# Patient Record
Sex: Male | Born: 1965
Health system: Southern US, Community
[De-identification: ages and names within clinical notes are randomized; demographics above are authoritative.]

## PROBLEM LIST (undated history)

## (undated) DIAGNOSIS — K909 Intestinal malabsorption, unspecified: Secondary | ICD-10-CM

## (undated) DIAGNOSIS — K219 Gastro-esophageal reflux disease without esophagitis: Secondary | ICD-10-CM

## (undated) DIAGNOSIS — K469 Unspecified abdominal hernia without obstruction or gangrene: Secondary | ICD-10-CM

## (undated) DIAGNOSIS — I4819 Other persistent atrial fibrillation: Secondary | ICD-10-CM

## (undated) DIAGNOSIS — Z9884 Bariatric surgery status: Secondary | ICD-10-CM

## (undated) DIAGNOSIS — E538 Deficiency of other specified B group vitamins: Secondary | ICD-10-CM

## (undated) DIAGNOSIS — M542 Cervicalgia: Secondary | ICD-10-CM

## (undated) DIAGNOSIS — G4730 Sleep apnea, unspecified: Secondary | ICD-10-CM

## (undated) DIAGNOSIS — I639 Cerebral infarction, unspecified: Secondary | ICD-10-CM

## (undated) DIAGNOSIS — I428 Other cardiomyopathies: Secondary | ICD-10-CM

## (undated) DIAGNOSIS — R319 Hematuria, unspecified: Secondary | ICD-10-CM

## (undated) DIAGNOSIS — I10 Essential (primary) hypertension: Secondary | ICD-10-CM

## (undated) DIAGNOSIS — L509 Urticaria, unspecified: Secondary | ICD-10-CM

## (undated) DIAGNOSIS — K645 Perianal venous thrombosis: Secondary | ICD-10-CM

## (undated) DIAGNOSIS — M19079 Primary osteoarthritis, unspecified ankle and foot: Secondary | ICD-10-CM

## (undated) DIAGNOSIS — G610 Guillain-Barre syndrome: Secondary | ICD-10-CM

## (undated) DIAGNOSIS — Z980 Intestinal bypass and anastomosis status: Secondary | ICD-10-CM

## (undated) DIAGNOSIS — D509 Iron deficiency anemia, unspecified: Secondary | ICD-10-CM

## (undated) HISTORY — DX: Deficiency of other specified B group vitamins: E53.8

## (undated) HISTORY — DX: Essential (primary) hypertension: I10

## (undated) HISTORY — DX: Guillain-Barre syndrome: G61.0

## (undated) HISTORY — DX: Iron deficiency anemia, unspecified: D50.9

## (undated) HISTORY — DX: Bariatric surgery status: Z98.84

## (undated) HISTORY — DX: Other persistent atrial fibrillation: I48.19

## (undated) HISTORY — DX: Cerebral infarction, unspecified: I63.9

## (undated) HISTORY — DX: Intestinal malabsorption, unspecified: K90.9

## (undated) HISTORY — DX: Unspecified abdominal hernia without obstruction or gangrene: K46.9

## (undated) HISTORY — DX: Sleep apnea, unspecified: G47.30

## (undated) HISTORY — DX: Perianal venous thrombosis: K64.5

## (undated) HISTORY — DX: Primary osteoarthritis, unspecified ankle and foot: M19.079

## (undated) HISTORY — DX: Intestinal bypass and anastomosis status: Z98.0

## (undated) HISTORY — DX: Urticaria, unspecified: L50.9

## (undated) HISTORY — DX: Morbid (severe) obesity due to excess calories: E66.01

## (undated) HISTORY — DX: Cervicalgia: M54.2

## (undated) HISTORY — PX: UMBILICAL HERNIA REPAIR: SHX196

## (undated) HISTORY — PX: HERNIA REPAIR: SHX51

---

## 2002-04-15 ENCOUNTER — Encounter: Payer: Self-pay | Admitting: General Practice

## 2002-04-15 ENCOUNTER — Encounter: Admission: RE | Admit: 2002-04-15 | Discharge: 2002-04-15 | Payer: Self-pay | Admitting: General Practice

## 2002-07-24 HISTORY — PX: GASTRIC BYPASS: SHX52

## 2004-01-24 ENCOUNTER — Ambulatory Visit: Admission: RE | Admit: 2004-01-24 | Discharge: 2004-01-24 | Payer: Self-pay | Admitting: Otolaryngology

## 2008-05-08 ENCOUNTER — Ambulatory Visit: Payer: Self-pay

## 2008-07-07 ENCOUNTER — Ambulatory Visit: Payer: Self-pay | Admitting: Cardiology

## 2010-12-06 NOTE — Assessment & Plan Note (Signed)
[facility]                            CARDIOLOGY OFFICE NOTE   NAME:Tyrone [name], Tyrone [name]                          MRN:          [id number]  DATE:07/06/2008                            DOB:          Jul 02, 1966    PRIMARY CARE PHYSICIAN:  [name], MD   REASON FOR PRESENTATION:  Evaluate the patient with chest pain and  abnormal Cardiolite.   HISTORY OF PRESENT ILLNESS:  The patient presents for followup of an  episode of chest pain that was about 6 weeks ago.  He said he wears CPAP  at night for sleep apnea.  He woke up vomiting into his mask.  He had  severe 10/10 chest discomfort like somebody was stabbing him in the  chest.  This went on for maybe 15-30 minutes.  It finally eased off on  its own.  He had a little bit of chest soreness throughout that night  and into the morning.  He presented to Dr. Sherryll Burger and was admitted to the  hospital where he ruled out for myocardial infarction.  He had maybe one  other mild episode of this couple of weeks later.  These were both at  rest.  There was no associated diaphoresis or shortness of breath.  He  had no radiation to his jaw or to his arms.  He had never had anything  like this before.  Again, it was unprovoked and went away on its own.  He said he was able to go on vacation not long ago and do an awful lot  of walking without any chest discomfort or shortness of breath.  He does  not exercise routinely, but he does coach baseball during the summer, he  has never had any problems with this.   He was sent for a stress perfusion study.  This was a negative study  with an EF of 61%; however, there was some apical thinning and he and  his family were concerned about this.   PAST MEDICAL HISTORY:  1. Morbid obesity.  2. Sleep apnea.  3. History of Guillain-Barre.  4. Gastroesophageal reflux disease.  5. Degenerative joint disease.   PAST SURGICAL HISTORY:  1. Gastric bypass.  2. Inguinal hernia repairs.  3. Cyst removed from his spine.   ALLERGIES:  None.   MEDICATIONS:  Citracal, multivitamin, vitamin C, Tums.   SOCIAL HISTORY:  The patient is married.  He owns a driving instructing  school.  He coaches baseball.  He has 1 child.  He quit chewing tobacco  in 2004 and does not smoke cigarettes.   Family history is contributory for his father dying of a heart attack  suddenly at age 58.   REVIEW OF SYSTEMS:  As stated in the HPI, positive for occasional  headaches and dizziness, rare palpitations.  Negative for all other  systems.   PHYSICAL EXAMINATION:  GENERAL:  The patient is pleasant and in no  distress.  VITAL SIGNS:  Blood pressure 138/90, heart rate 63 and regular, weight  134 pounds, body mass index 37.  HEENT:  Eyes  unremarkable, pupils equal, round, and reactive to light,  fundi within normal limits, oral mucosa unremarkable.  NECK:  No jugular venous distention at 45 degrees, carotid upstroke  brisk and symmetric, no bruits, no thyromegaly.  LYMPHATICS:  No cervical, axillary, or inguinal adenopathy.  LUNGS:  Clear to auscultation bilaterally.  BACK:  No costovertebral angle tenderness.  CHEST:  Unremarkable.  HEART:  PMI not displaced or sustained, S1 and S2 within normal limits,  no S3, no S4, no clicks, no rubs, no murmurs.  ABDOMEN:  Obese, positive bowel sounds normal frequency and pitch, no  bruits, no rebound, no guarding, no midline pulsatile mass, no  hepatomegaly, no splenomegaly.  SKIN:  No rashes, no nodules.  EXTREMITIES:  Pulses 2+ throughout, no edema, no cyanosis, no clubbing.  NEUROLOGIC:  Oriented to person, place, and time, cranial nerves II  through XII grossly intact, motor grossly intact.   EKG:  Sinus rhythm, rate 63, axis within normal limits, intervals within  normal limits, no acute ST-T wave changes.   ASSESSMENT AND PLAN:  1. Chest discomfort.  The patient's chest discomfort was atypical,      though he has a strong family history.  A  stress perfusion study      was essentially normal.  I reviewed this with him and pointed out      the physiology of apical thinning.  I think this combination of      atypical symptoms with this Cardiolite suggests that it is unlikely      that he has any high-grade obstructive coronary disease.  However,      we reviewed at length the concept of a nonobstructive disease and      the risk of spontaneous plaque rupture.  Therefore, he needs      aggressive risk reduction.  He will follow with Dr. Sherryll Burger for workup      of nonanginal possibly GI chest pain should his symptoms continue.  2. Obesity.  I applaud his weight loss and encouraged more of the      same.  3. Risk reduction.  He says he has normal lipids, and I encouraged him      to have the numbers in hand, and I would suggest his goal should be      an LDL less than 100 and HDL greater than 40.  4. Exercise.  I outlined to the patient an exercise regimen.  Though      he works 2 jobs, he      should still be able to exercise half hour 5 days a week.  5. Followup.  I will see the patient back as needed.     Rollene Rotunda, MD, [facility]  Electronically Signed    JH/MedQ  DD: 07/07/2008  DT: 07/07/2008  Job #: [id number]   cc:   [name], MD

## 2010-12-09 NOTE — Procedures (Signed)
NAME:  Tyrone [name], Tyrone [name] NO.:  [id number]   MEDICAL RECORD NO.:  [id number]          PATIENT TYPE:  OUT   LOCATION:  SLEEP LAB                     FACILITY:  [facility]   PHYSICIAN:  Marcelyn Bruins, M.D. [facility] DATE OF BIRTH:  February 28, 1966   DATE OF ADMISSION:  01/24/2004  DATE OF DISCHARGE:  01/24/2004                              NOCTURNAL POLYSOMNOGRAM   REFERRING PHYSICIAN:  Dr. Jori Moll   INDICATION FOR STUDY:  History of obstructive sleep apnea and difficulty  with CPAP machine after significant weight loss.   SLEEP ARCHITECTURE:  Total sleep time was 307 minutes with decreased REM and  slow-wave sleep.  Sleep onset latency was significantly prolonged at 89  minutes and REM latency was normal.   IMPRESSION/RECOMMENDATION:  1. A split night study was scheduled for the patient.  However, inadequate     numbers of events did not occur until approximately 3 a.m.  At that     point, the patient began to sleep more consistently in the supine     position, accompanied by a great increase in the number of obstructive     events.  Since the patient had been on CPAP as an outpatient, CPAP was     initiated at this point and titrated to therapeutic levels.  The patient     was found to have moderate obstructive sleep apnea with a respiratory     disturbance index of 26 events per hour and desaturation to 85% prior to     initiation of CPAP.  He was found to have 82 obstructive events in the     first 189 minutes of sleep.  These events were clearly positional as     noted above.  There was moderate snoring and snorting noted prior to CPAP     initiation.  The patient was titrated with a medium/small comfort select     gel mask.  However, he had difficulties with mouth opening.  The patient     may need a chin strap or possibly change to a full face mask in order to     alleviate these difficulties.  The patient was titrated to a level of 6     cm with excellent control of  obstructive events even through supine REM.  2. No cardiac arrhythmias.  3. No significant periodic leg movements were noted.                                   ______________________________                                Marcelyn Bruins, M.D. [facility]     KC/MEDQ  D:  02/05/2004 16:57:51  T:  02/06/2004 23:39:04  Job:  [id number]

## 2011-09-25 ENCOUNTER — Encounter: Payer: Self-pay | Admitting: Cardiology

## 2011-11-16 ENCOUNTER — Ambulatory Visit (INDEPENDENT_AMBULATORY_CARE_PROVIDER_SITE_OTHER): Payer: BC Managed Care – PPO | Admitting: Cardiology

## 2011-11-16 ENCOUNTER — Encounter: Payer: Self-pay | Admitting: Cardiology

## 2011-11-16 VITALS — BP 162/90 | HR 54 | Ht 66.0 in | Wt 247.0 lb

## 2011-11-16 DIAGNOSIS — F4541 Pain disorder exclusively related to psychological factors: Secondary | ICD-10-CM

## 2011-11-16 DIAGNOSIS — I10 Essential (primary) hypertension: Secondary | ICD-10-CM

## 2011-11-16 DIAGNOSIS — E669 Obesity, unspecified: Secondary | ICD-10-CM

## 2011-11-16 DIAGNOSIS — G44209 Tension-type headache, unspecified, not intractable: Secondary | ICD-10-CM

## 2011-11-16 MED ORDER — SPIRONOLACTONE 25 MG PO TABS: 25.0000 mg | ORAL_TABLET | Freq: Every day | ORAL | Status: DC

## 2011-11-16 NOTE — Assessment & Plan Note (Signed)
Patient has difficult to control blood pressure. I will start with a workup for secondary causes. He's had normal electrolytes and TSH. He had treated sleep apnea. Echocardiography recently demonstrated no significant abnormalities other than mild aortic stenosis. I will order renin and aldosterone level and a 24 hour urine for metanephrines. I will start him on spironolactone 25 mg daily with a basic metabolic profile in one week in one month. He will likely need further adjustments. We discussed therapeutic lifestyle changes such as weight loss.

## 2011-11-16 NOTE — Patient Instructions (Addendum)
Your physician recommends that you schedule a follow-up appointment in: ONE MONTH WITH DR [facility] IN [place]  Your physician recommends that you HAVE LAB WORK TODAY  HAVE LAB WORK IN ONE WEEK AND IN ONE MONTH  START SPIRONOLACTONE 25 MG ONCE DAILY   24 HOUR URINE FOR VMA AND METINEPHRINES

## 2011-11-16 NOTE — Assessment & Plan Note (Signed)
He had bariatic surgery but he has gained some weight back.  We talked about weight loss.

## 2011-11-16 NOTE — Progress Notes (Signed)
HPI The patient presents for followup of difficult to control hypertension. I saw him greater than 3 years ago for a stress test given cardiovascular risk factors. He's had no new cardiovascular complaints since then but he has been noted to have hypertension recently. He says his blood pressures often in the 170/110 range. He was started on a low-dose of Cozaar and her dose doubled and he had no benefit from this. He does have some itching in his legs but denies flushing, orthostatic symptoms. He doesn't exercise but he walks a lot with his job. With this he denies any cardiovascular symptoms The patient denies any new symptoms such as chest discomfort, neck or arm discomfort. There has been no new shortness of breath, PND or orthopnea. There have been no reported palpitations, presyncope or syncope.  He has stress working about 80 hours per week.   No Known Allergies  Current Outpatient Prescriptions  Medication Sig Dispense Refill  . Calcium Carbonate-Vitamin D (CALCIUM 500+D PO) Take by mouth daily.      Marland Kitchen losartan (COZAAR) 50 MG tablet Take 50 mg by mouth daily.      . Multiple Vitamin (MULTIVITAMIN) tablet Take 2 tablets by mouth daily.      . vitamin E (VITAMIN E) 400 UNIT capsule Take 400 Units by mouth daily.        Past Medical History  Diagnosis Date  . External thrombosed hemorrhoids   . Chest pain   . Osteoarthrosis, unspecified whether generalized or localized, ankle and foot     of the knees,ankle and foot  . Guillain-Barre syndrome   . Osteoarthrosis, unspecified whether generalized or localized, lower leg   . Hypersomnia with sleep apnea, unspecified   . Cervicalgia   . Vitamin B 12 deficiency   . Reflux esophagitis   . Urticaria, unspecified   . Sinoatrial node dysfunction   . Morbid obesity   . Hernia of unspecified site of abdominal cavity without mention of obstruction or gangrene     of abdominal  . Unspecified intestinal malabsorption   . Pancytopenia   .  Hypertension     Past Surgical History  Procedure Date  . Gastric bypass 2004  . Umbilical hernia repair     x 2    Family History  Problem Relation Age of Onset  . Hypertension Mother   . Hypertension Father   . Heart attack Father 39    Died  . Diabetes Father   . Kidney disease Father     with transplant    History   Social History  . Marital Status: Married    Spouse Name: N/A    Number of Children: 1  . Years of Education: N/A   Occupational History  . Realtor    Social History Main Topics  . Smoking status: Never Smoker   . Smokeless tobacco: Former Neurosurgeon    Types: Chew    Quit date: 11/11/2002  . Alcohol Use: Yes  . Drug Use: Not on file  . Sexually Active: Not on file   Other Topics Concern  . Not on file   Social History Narrative   Lives with wife.      ROS: As stated in the HPI and negative for all other systems.   PHYSICAL EXAM BP 162/90  Pulse 54  Ht 5\' 6"  (1.676 m)  Wt 247 lb (112.038 kg)  BMI 39.87 kg/m2 GENERAL:  Well appearing HEENT:  Pupils equal round and reactive, fundi not visualized,  oral mucosa unremarkable NECK:  No jugular venous distention, waveform within normal limits, carotid upstroke brisk and symmetric, no bruits, no thyromegaly LYMPHATICS:  No cervical, inguinal adenopathy LUNGS:  Clear to auscultation bilaterally BACK:  No CVA tenderness CHEST:  Unremarkable HEART:  PMI not displaced or sustained,S1 and S2 within normal limits, no S3, no S4, no clicks, no rubs, soft apical systolic murmur ABD:  Flat, positive bowel sounds normal in frequency in pitch, no bruits, no rebound, no guarding, no midline pulsatile mass, no hepatomegaly, no splenomegaly, obese EXT:  2 plus pulses throughout, no edema, no cyanosis no clubbing SKIN:  No rashes no nodules NEURO:  Cranial nerves II through XII grossly intact, motor grossly intact throughout PSYCH:  Cognitively intact, oriented to person place and time  EKG:  Sinus rhythm, rate  47, axis within normal limits, intervals within normal limits, no acute ST-T wave changes.  ASSESSMENT AND PLAN

## 2011-11-20 ENCOUNTER — Other Ambulatory Visit: Payer: Self-pay | Admitting: Cardiology

## 2011-11-25 LAB — ALDOSTERONE + RENIN ACTIVITY W/ RATIO
ALDO / PRA Ratio: 5.6 Ratio (ref 0.9–28.9)
Aldosterone: 2 ng/dL
PRA LC/MS/MS: 0.36 ng/mL/h (ref 0.25–5.82)

## 2011-11-26 LAB — VMA, URINE, 24 HOUR: Creatinine 24h urine: 1.42 g/(24.h) (ref 0.63–2.50)

## 2011-11-27 ENCOUNTER — Other Ambulatory Visit: Payer: Self-pay | Admitting: Cardiology

## 2011-11-27 LAB — BASIC METABOLIC PANEL
BUN: 11 mg/dL (ref 6–23)
CO2: 27 mEq/L (ref 19–32)
Calcium: 8.6 mg/dL (ref 8.4–10.5)
Chloride: 107 mEq/L (ref 96–112)
Creat: 0.84 mg/dL (ref 0.50–1.35)
Glucose, Bld: 115 mg/dL — ABNORMAL HIGH (ref 70–99)
Potassium: 5 mEq/L (ref 3.5–5.3)
Sodium: 141 mEq/L (ref 135–145)

## 2011-11-29 LAB — METANEPHRINES, URINE, 24 HOUR
Metaneph Total, Ur: 460 ug/(24.h) (ref 182–739)
Metanephrines, Ur: 103 ug/(24.h) (ref 58–203)
Normetanephrine, 24H Ur: 357 ug/(24.h) (ref 88–649)

## 2011-11-30 ENCOUNTER — Encounter: Payer: Self-pay | Admitting: Cardiology

## 2011-12-07 ENCOUNTER — Telehealth: Payer: Self-pay

## 2011-12-07 NOTE — Telephone Encounter (Signed)
left message to call office about lab work

## 2011-12-08 ENCOUNTER — Telehealth: Payer: Self-pay

## 2011-12-08 NOTE — Telephone Encounter (Signed)
Patient returning call about lab results.  

## 2011-12-25 ENCOUNTER — Other Ambulatory Visit: Payer: Self-pay | Admitting: Cardiology

## 2011-12-25 LAB — BASIC METABOLIC PANEL
BUN: 15 mg/dL (ref 6–23)
CO2: 27 mEq/L (ref 19–32)
Calcium: 8.7 mg/dL (ref 8.4–10.5)
Chloride: 105 mEq/L (ref 96–112)
Creat: 0.89 mg/dL (ref 0.50–1.35)
Glucose, Bld: 95 mg/dL (ref 70–99)
Potassium: 4.6 mEq/L (ref 3.5–5.3)
Sodium: 140 mEq/L (ref 135–145)

## 2011-12-26 ENCOUNTER — Ambulatory Visit (INDEPENDENT_AMBULATORY_CARE_PROVIDER_SITE_OTHER): Payer: BC Managed Care – PPO | Admitting: Cardiology

## 2011-12-26 ENCOUNTER — Encounter: Payer: Self-pay | Admitting: *Deleted

## 2011-12-26 ENCOUNTER — Encounter: Payer: Self-pay | Admitting: Cardiology

## 2011-12-26 VITALS — BP 150/92 | HR 65 | Resp 18 | Ht 66.0 in | Wt 247.0 lb

## 2011-12-26 DIAGNOSIS — I10 Essential (primary) hypertension: Secondary | ICD-10-CM

## 2011-12-26 DIAGNOSIS — R079 Chest pain, unspecified: Secondary | ICD-10-CM

## 2011-12-26 DIAGNOSIS — E669 Obesity, unspecified: Secondary | ICD-10-CM

## 2011-12-26 MED ORDER — LOSARTAN POTASSIUM 50 MG PO TABS: 50.0000 mg | ORAL_TABLET | Freq: Two times a day (BID) | ORAL | Status: DC

## 2011-12-26 NOTE — Patient Instructions (Addendum)
   Increase Cozaar to 50mg  twice a day  Your physician recommends that you go to the [facility] lab for blood work in 2 weeks for [employer]. Exercise Treadmill Test - [place] office Follow up based on above test results

## 2011-12-26 NOTE — Assessment & Plan Note (Signed)
He has had some atypical chest pain in the past. He has risk factors and I would like for him to exercise. I will bring the patient back for a POET (Plain Old Exercise Test). This will allow me to screen for obstructive coronary disease, risk stratify and very importantly provide a prescription for exercise.

## 2011-12-26 NOTE — Assessment & Plan Note (Signed)
The patient understands the need to lose weight with diet and exercise. We have discussed specific strategies for this.  

## 2011-12-26 NOTE — Progress Notes (Signed)
   HPI The patient presents for followup of difficult to control hypertension. At the last visit I added spironolactone. He did well with this though his blood pressure is still above target. Followup blood work demonstrated normal potassium though upper limits. The patient denies any new symptoms such as chest discomfort, neck or arm discomfort. There has been no new shortness of breath, PND or orthopnea. There have been no reported palpitations, presyncope or syncope.  He has not lost any weight yet as he has not concentrated on this.  No Known Allergies  Current Outpatient Prescriptions  Medication Sig Dispense Refill  . Calcium Carbonate-Vitamin D (CALCIUM 500+D PO) Take by mouth daily.      . Cyanocobalamin (VITAMIN B12 PO) Take by mouth daily.      Marland Kitchen losartan (COZAAR) 50 MG tablet Take 50 mg by mouth daily.      . Multiple Vitamin (MULTIVITAMIN) tablet Take 2 tablets by mouth daily.      Marland Kitchen spironolactone (ALDACTONE) 25 MG tablet Take 1 tablet (25 mg total) by mouth daily.  30 tablet  12  . vitamin E (VITAMIN E) 400 UNIT capsule Take 400 Units by mouth daily.        Past Medical History  Diagnosis Date  . External thrombosed hemorrhoids   . Chest pain   . Osteoarthrosis, unspecified whether generalized or localized, ankle and foot     of the knees,ankle and foot  . Guillain-Barre syndrome   . Osteoarthrosis, unspecified whether generalized or localized, lower leg   . Hypersomnia with sleep apnea, unspecified   . Cervicalgia   . Vitamin B 12 deficiency   . Reflux esophagitis   . Urticaria, unspecified   . Sinoatrial node dysfunction   . Morbid obesity   . Hernia of unspecified site of abdominal cavity without mention of obstruction or gangrene     of abdominal  . Unspecified intestinal malabsorption   . Pancytopenia   . Hypertension     Past Surgical History  Procedure Date  . Gastric bypass 2004  . Umbilical hernia repair     x 2    ROS: As stated in the HPI and  negative for all other systems.   PHYSICAL EXAM BP 150/92  Pulse 65  Resp 18  Ht 5\' 6"  (1.676 m)  Wt 247 lb (112.038 kg)  BMI 39.87 kg/m2 GENERAL:  Well appearing HEENT:  Pupils equal round and reactive, fundi not visualized, oral mucosa unremarkable NECK:  No jugular venous distention, waveform within normal limits, carotid upstroke brisk and symmetric, no bruits, no thyromegaly LYMPHATICS:  No cervical, inguinal adenopathy LUNGS:  Clear to auscultation bilaterally BACK:  No CVA tenderness CHEST:  Unremarkable HEART:  PMI not displaced or sustained,S1 and S2 within normal limits, no S3, no S4, no clicks, no rubs, soft apical systolic murmur ABD:  Flat, positive bowel sounds normal in frequency in pitch, no bruits, no rebound, no guarding, no midline pulsatile mass, no hepatomegaly, no splenomegaly, obese EXT:  2 plus pulses throughout, no edema, no cyanosis no clubbing    ASSESSMENT AND PLAN

## 2011-12-26 NOTE — Assessment & Plan Note (Signed)
I will increase Cozaar to 50 mg bid.  He needs a BMET in two weeks.

## 2011-12-31 NOTE — Progress Notes (Signed)
Labs OK

## 2012-01-15 ENCOUNTER — Ambulatory Visit (INDEPENDENT_AMBULATORY_CARE_PROVIDER_SITE_OTHER): Payer: BC Managed Care – PPO

## 2012-01-15 ENCOUNTER — Encounter: Payer: Self-pay | Admitting: Cardiology

## 2012-01-15 DIAGNOSIS — R0989 Other specified symptoms and signs involving the circulatory and respiratory systems: Secondary | ICD-10-CM

## 2012-01-15 DIAGNOSIS — R079 Chest pain, unspecified: Secondary | ICD-10-CM

## 2012-01-15 NOTE — Procedures (Signed)
Exercise Treadmill Test  Pre-Exercise Testing Evaluation Rhythm: sinus bradycardia  Rate: 56   PR:  .13 QRS:  .11  QT:  .43 QTc: .42     Test  Exercise Tolerance Test Ordering MD: Angelina Sheriff, MD  Interpreting MD: Ronie Spies PA-C  Unique Test No: 1  Treadmill:  1  Indication for ETT: chest pain - rule out ischemia  Contraindication to ETT: No   Stress Modality: exercise - treadmill  Cardiac Imaging Performed: non   Protocol: standard Bruce - maximal  Max BP: 212/78  Max MPHR (bpm):  173 85% MPR (bpm):  147  MPHR obtained (bpm):  176 % MPHR obtained:  102%  Reached 85% MPHR (min:sec):  7:15 Total Exercise Time (min-sec):  8:00  Workload in METS:  10.1 Borg Scale: 14  Reason ETT Terminated: frequent PACS/PVCs/brief NSVT becoming more frequent    ST Segment Analysis At Rest: normal ST segments - no evidence of significant ST depression With Exercise: no evidence of significant ST depression  Other Information Arrhythmia:  Yes Angina during ETT:  absent (0) Quality of ETT:  diagnostic  ETT Interpretation:  borderline (indeterminate) with non-specific ST changes  Comments: Pretest: BP elevated at baseline EKG NSR ICRBBB nonspecific changes but not significantly changed from 12/26/11 EKG During: PACs/PVCs noted, then near max exercise patient began to exhibit more frequent atrial tach & brief 3-beat NSVT as well. Test stopped due to arrhythmias/BP elevation; max HR had been achieved. No CP or SOB. Patient was asymptomatic during these. Post: Atrial tach becoming less frequent and resolving. No further NSVT. BP coming down. EKG nonacute.  Recommendations: Discussed EKG tracings with Dr. Tenny Craw. Patient had intermittent nonsustained atrial tach during peak exercise, occasional PVCs. Ventricular triplet was noted right in beginning of recovery. These arrhythmias were not associated with any symptoms. BP however did increase with exercise. Per discussion with Dr. Tenny Craw, this study is  indeterminate and may be low risk in absence of ST changes/symptoms, but will defer to Dr. Antoine Poche for further determination of w/u. Patient made aware of findings as well.

## 2012-01-18 ENCOUNTER — Ambulatory Visit (INDEPENDENT_AMBULATORY_CARE_PROVIDER_SITE_OTHER): Payer: BC Managed Care – PPO | Admitting: Cardiology

## 2012-01-18 ENCOUNTER — Encounter: Payer: Self-pay | Admitting: Cardiology

## 2012-01-18 VITALS — BP 155/100 | HR 72 | Ht 66.0 in | Wt 248.4 lb

## 2012-01-18 DIAGNOSIS — E669 Obesity, unspecified: Secondary | ICD-10-CM

## 2012-01-18 DIAGNOSIS — I491 Atrial premature depolarization: Secondary | ICD-10-CM

## 2012-01-18 DIAGNOSIS — I10 Essential (primary) hypertension: Secondary | ICD-10-CM

## 2012-01-18 DIAGNOSIS — I499 Cardiac arrhythmia, unspecified: Secondary | ICD-10-CM

## 2012-01-18 DIAGNOSIS — R079 Chest pain, unspecified: Secondary | ICD-10-CM

## 2012-01-18 MED ORDER — SPIRONOLACTONE 50 MG PO TABS: 50.0000 mg | ORAL_TABLET | Freq: Every day | ORAL | Status: DC

## 2012-01-18 MED ORDER — METOPROLOL TARTRATE 25 MG PO TABS: 25.0000 mg | ORAL_TABLET | Freq: Two times a day (BID) | ORAL | Status: DC

## 2012-01-18 NOTE — Assessment & Plan Note (Signed)
The patient understands the need to lose weight with diet and exercise. We have discussed specific strategies for this.  

## 2012-01-18 NOTE — Patient Instructions (Addendum)
Please increase Spironolactone to 50 mg a day and start Metoprolol to 25 mg twice a day. Continue all other medications as listed.  Have blood work in 1 week and again in 1 month. (BMP)  Follow up with Dr Antoine Poche in 1 month.

## 2012-01-18 NOTE — Assessment & Plan Note (Signed)
He is no longer having chest pain and had none during the stress test.  No change in therapy is indicated.

## 2012-01-18 NOTE — Assessment & Plan Note (Signed)
The blood pressure is still not well controlled. Today I will increase his spironolactone to 50 mg daily. I'm going to have a low dose of beta blocker and metoprolol 25 twice a day. He will continue to keep his blood pressure diary.

## 2012-01-18 NOTE — Assessment & Plan Note (Signed)
I reviewed the full treadmill. While there was some ventricular ectopy the predominance is atrial ectopy and atrial tachycardia with some aberrancy. I do not think this represents an anginal equivalent or evidence for obstructive coronary disease. I will repeat this test when we have had better blood pressure control.

## 2012-01-18 NOTE — Progress Notes (Signed)
   HPI The patient presents for followup of difficult to control hypertension. I sent him for exercise treadmill test which was negative for any evidence of ischemia. However, he had PVCs and some nonsustained ventricular tachycardia.  The patient didn't tolerate the addition of spironolactone and increasing his Cozaar at the last visit. He does not notice any palpitations, presyncope or syncope. He does not have any chest pressure, neck or arm discomfort. He has no shortness of breath, PND or orthopnea. However, his blood pressures not been well-controlled. He reports that his systolics are typically in the 170s.  No Known Allergies  Current Outpatient Prescriptions  Medication Sig Dispense Refill  . Calcium Carbonate-Vitamin D (CALCIUM 500+D PO) Take by mouth daily.      . Cyanocobalamin (VITAMIN B12 PO) Take by mouth daily.      Marland Kitchen losartan (COZAAR) 50 MG tablet Take 1 tablet (50 mg total) by mouth 2 (two) times daily.  60 tablet  6  . Multiple Vitamin (MULTIVITAMIN) tablet Take 2 tablets by mouth daily.      Marland Kitchen spironolactone (ALDACTONE) 25 MG tablet Take 1 tablet (25 mg total) by mouth daily.  30 tablet  12  . vitamin E (VITAMIN E) 400 UNIT capsule Take 400 Units by mouth daily.        Past Medical History  Diagnosis Date  . External thrombosed hemorrhoids   . Chest pain   . Osteoarthrosis, unspecified whether generalized or localized, ankle and foot     of the knees,ankle and foot  . Guillain-Barre syndrome   . Osteoarthrosis, unspecified whether generalized or localized, lower leg   . Hypersomnia with sleep apnea, unspecified   . Cervicalgia   . Vitamin B 12 deficiency   . Reflux esophagitis   . Urticaria, unspecified   . Sinoatrial node dysfunction   . Morbid obesity   . Hernia of unspecified site of abdominal cavity without mention of obstruction or gangrene     of abdominal  . Unspecified intestinal malabsorption   . Pancytopenia   . Hypertension     Past Surgical  History  Procedure Date  . Gastric bypass 2004  . Umbilical hernia repair     x 2    ROS: As stated in the HPI and negative for all other systems.   PHYSICAL EXAM BP 155/100  Pulse 72  Ht 5\' 6"  (1.676 m)  Wt 248 lb 6.4 oz (112.674 kg)  BMI 40.09 kg/m2 GENERAL:  Well appearing HEENT:  Pupils equal round and reactive, fundi not visualized, oral mucosa unremarkable NECK:  No jugular venous distention, waveform within normal limits, carotid upstroke brisk and symmetric, no bruits, no thyromegaly LYMPHATICS:  No cervical, inguinal adenopathy LUNGS:  Clear to auscultation bilaterally BACK:  No CVA tenderness CHEST:  Unremarkable HEART:  PMI not displaced or sustained,S1 and S2 within normal limits, no S3, no S4, no clicks, no rubs, soft apical systolic murmur ABD:  Flat, positive bowel sounds normal in frequency in pitch, no bruits, no rebound, no guarding, no midline pulsatile mass, no hepatomegaly, no splenomegaly, obese EXT:  2 plus pulses throughout, no edema, no cyanosis no clubbing   ASSESSMENT AND PLAN

## 2012-01-26 ENCOUNTER — Other Ambulatory Visit: Payer: BC Managed Care – PPO

## 2012-02-01 ENCOUNTER — Other Ambulatory Visit: Payer: Self-pay | Admitting: Cardiology

## 2012-02-01 LAB — BASIC METABOLIC PANEL
BUN: 11 mg/dL (ref 6–23)
CO2: 26 mEq/L (ref 19–32)
Calcium: 8.6 mg/dL (ref 8.4–10.5)
Chloride: 108 mEq/L (ref 96–112)
Creat: 0.93 mg/dL (ref 0.50–1.35)
Glucose, Bld: 89 mg/dL (ref 70–99)
Potassium: 4.6 mEq/L (ref 3.5–5.3)
Sodium: 141 mEq/L (ref 135–145)

## 2012-02-02 ENCOUNTER — Telehealth: Payer: Self-pay | Admitting: *Deleted

## 2012-02-02 NOTE — Telephone Encounter (Signed)
Message copied by Eustace Moore on Fri Feb 02, 2012  9:35 AM ------      Message from: Rollene Rotunda      Created: Fri Feb 02, 2012  8:23 AM       Labs OK. Call Mr. Crite with the results and send results to [facility], [place]

## 2012-02-02 NOTE — Telephone Encounter (Signed)
Patient informed. Copy sent to [facility] office.

## 2012-02-11 ENCOUNTER — Encounter (HOSPITAL_COMMUNITY): Payer: Self-pay | Admitting: Family Medicine

## 2012-02-11 ENCOUNTER — Emergency Department (HOSPITAL_COMMUNITY): Payer: BC Managed Care – PPO

## 2012-02-11 ENCOUNTER — Inpatient Hospital Stay (HOSPITAL_COMMUNITY)
Admission: EM | Admit: 2012-02-11 | Discharge: 2012-02-16 | DRG: 544 | Disposition: A | Discharge status: Home / Self Care | Payer: BC Managed Care – PPO | Attending: Cardiology | Admitting: Cardiology

## 2012-02-11 DIAGNOSIS — Z9884 Bariatric surgery status: Secondary | ICD-10-CM

## 2012-02-11 DIAGNOSIS — F1011 Alcohol abuse, in remission: Secondary | ICD-10-CM

## 2012-02-11 DIAGNOSIS — I10 Essential (primary) hypertension: Secondary | ICD-10-CM | POA: Diagnosis present

## 2012-02-11 DIAGNOSIS — I4891 Unspecified atrial fibrillation: Principal | ICD-10-CM | POA: Diagnosis present

## 2012-02-11 DIAGNOSIS — R079 Chest pain, unspecified: Secondary | ICD-10-CM | POA: Diagnosis present

## 2012-02-11 DIAGNOSIS — Z833 Family history of diabetes mellitus: Secondary | ICD-10-CM

## 2012-02-11 DIAGNOSIS — F1010 Alcohol abuse, uncomplicated: Secondary | ICD-10-CM | POA: Diagnosis present

## 2012-02-11 DIAGNOSIS — I428 Other cardiomyopathies: Secondary | ICD-10-CM | POA: Diagnosis present

## 2012-02-11 DIAGNOSIS — G4730 Sleep apnea, unspecified: Secondary | ICD-10-CM | POA: Diagnosis present

## 2012-02-11 DIAGNOSIS — Z6838 Body mass index (BMI) 38.0-38.9, adult: Secondary | ICD-10-CM

## 2012-02-11 DIAGNOSIS — Z8249 Family history of ischemic heart disease and other diseases of the circulatory system: Secondary | ICD-10-CM

## 2012-02-11 DIAGNOSIS — I509 Heart failure, unspecified: Secondary | ICD-10-CM | POA: Diagnosis present

## 2012-02-11 DIAGNOSIS — K2100 Gastro-esophageal reflux disease with esophagitis, without bleeding: Secondary | ICD-10-CM | POA: Diagnosis present

## 2012-02-11 DIAGNOSIS — I5021 Acute systolic (congestive) heart failure: Secondary | ICD-10-CM | POA: Diagnosis present

## 2012-02-11 DIAGNOSIS — R319 Hematuria, unspecified: Secondary | ICD-10-CM | POA: Diagnosis present

## 2012-02-11 HISTORY — DX: Other cardiomyopathies: I42.8

## 2012-02-11 HISTORY — DX: Hematuria, unspecified: R31.9

## 2012-02-11 HISTORY — DX: Gastro-esophageal reflux disease without esophagitis: K21.9

## 2012-02-11 LAB — BASIC METABOLIC PANEL
BUN: 10 mg/dL (ref 6–23)
CO2: 23 mEq/L (ref 19–32)
Calcium: 8 mg/dL — ABNORMAL LOW (ref 8.4–10.5)
Chloride: 109 mEq/L (ref 96–112)
Creatinine, Ser: 0.96 mg/dL (ref 0.50–1.35)
GFR calc Af Amer: >90 mL/min (ref >90)
GFR calc non Af Amer: >90 mL/min (ref >90)
Glucose, Bld: 98 mg/dL (ref 70–99)
Potassium: 4.3 mEq/L (ref 3.5–5.1)
Sodium: 140 mEq/L (ref 135–145)

## 2012-02-11 LAB — CBC WITH DIFFERENTIAL/PLATELET
Basophils Absolute: 0.1 10*3/uL (ref 0.0–0.1)
Basophils Relative: 1 % (ref 0–1)
Eosinophils Absolute: 0.1 10*3/uL (ref 0.0–0.7)
Eosinophils Relative: 2 % (ref 0–5)
HCT: 39.1 % (ref 39.0–52.0)
Hemoglobin: 13 g/dL (ref 13.0–17.0)
Lymphocytes Relative: 27 % (ref 12–46)
Lymphs Abs: 1.6 10*3/uL (ref 0.7–4.0)
MCH: 31.1 pg (ref 26.0–34.0)
MCHC: 33.2 g/dL (ref 30.0–36.0)
MCV: 93.5 fL (ref 78.0–100.0)
Monocytes Absolute: 0.4 10*3/uL (ref 0.1–1.0)
Monocytes Relative: 6 % (ref 3–12)
Neutro Abs: 3.8 10*3/uL (ref 1.7–7.7)
Neutrophils Relative %: 64 % (ref 43–77)
Platelets: 164 10*3/uL (ref 150–400)
RBC: 4.18 MIL/uL — ABNORMAL LOW (ref 4.22–5.81)
RDW: 14 % (ref 11.5–15.5)
WBC: 6 10*3/uL (ref 4.0–10.5)

## 2012-02-11 LAB — URINALYSIS, ROUTINE W REFLEX MICROSCOPIC
Bilirubin Urine: NEGATIVE
Glucose, UA: NEGATIVE mg/dL
Ketones, ur: NEGATIVE mg/dL
Leukocytes, UA: NEGATIVE
Nitrite: NEGATIVE
Protein, ur: NEGATIVE mg/dL
Specific Gravity, Urine: 1.012 (ref 1.005–1.030)
Urobilinogen, UA: 0.2 mg/dL (ref 0.0–1.0)
pH: 6 (ref 5.0–8.0)

## 2012-02-11 LAB — URINE MICROSCOPIC-ADD ON

## 2012-02-11 LAB — PROTIME-INR
INR: 1.29 (ref 0.00–1.49)
Prothrombin Time: 16.3 seconds — ABNORMAL HIGH (ref 11.6–15.2)

## 2012-02-11 LAB — PRO B NATRIURETIC PEPTIDE: Pro B Natriuretic peptide (BNP): 1106 pg/mL — ABNORMAL HIGH (ref 0–125)

## 2012-02-11 LAB — APTT: aPTT: 26 seconds (ref 24–37)

## 2012-02-11 LAB — POCT I-STAT TROPONIN I: Troponin i, poc: 0 ng/mL (ref 0.00–0.08)

## 2012-02-11 MED ORDER — ADULT MULTIVITAMIN W/MINERALS CH
3.0000 | ORAL_TABLET | Freq: Every day | ORAL | Status: DC
  Administered 2012-02-12 – 2012-02-16 (×5): 3 via ORAL
  Filled 2012-02-11 (×5): qty 3

## 2012-02-11 MED ORDER — SODIUM CHLORIDE 0.9 % IV SOLN
INTRAVENOUS | Status: DC
  Administered 2012-02-11: 125 mL/h via INTRAVENOUS

## 2012-02-11 MED ORDER — DEXTROSE 5 % IV SOLN
5.0000 mg/h | INTRAVENOUS | Status: DC
  Filled 2012-02-11: qty 100

## 2012-02-11 MED ORDER — ASPIRIN 81 MG PO CHEW
324.0000 mg | CHEWABLE_TABLET | Freq: Once | ORAL | Status: AC
  Administered 2012-02-11: 324 mg via ORAL
  Filled 2012-02-11: qty 4

## 2012-02-11 MED ORDER — CALCIUM CARBONATE-VITAMIN D 500-200 MG-UNIT PO TABS
1.0000 | ORAL_TABLET | Freq: Every day | ORAL | Status: DC
  Administered 2012-02-12 – 2012-02-16 (×5): 1 via ORAL
  Filled 2012-02-11 (×7): qty 1

## 2012-02-11 MED ORDER — FUROSEMIDE 10 MG/ML IJ SOLN
40.0000 mg | Freq: Two times a day (BID) | INTRAMUSCULAR | Status: DC
  Administered 2012-02-12 (×2): 40 mg via INTRAVENOUS
  Filled 2012-02-11 (×6): qty 4

## 2012-02-11 MED ORDER — SPIRONOLACTONE 50 MG PO TABS
50.0000 mg | ORAL_TABLET | Freq: Every day | ORAL | Status: DC
  Filled 2012-02-11 (×2): qty 1

## 2012-02-11 MED ORDER — METOPROLOL TARTRATE 25 MG PO TABS
25.0000 mg | ORAL_TABLET | Freq: Two times a day (BID) | ORAL | Status: DC
  Administered 2012-02-12: 25 mg via ORAL

## 2012-02-11 MED ORDER — VITAMIN E 180 MG (400 UNIT) PO CAPS
400.0000 [IU] | ORAL_CAPSULE | Freq: Every day | ORAL | Status: DC
  Administered 2012-02-12 – 2012-02-16 (×5): 400 [IU] via ORAL
  Filled 2012-02-11 (×7): qty 1

## 2012-02-11 MED ORDER — VITAMIN B-12 100 MCG PO TABS
100.0000 ug | ORAL_TABLET | Freq: Every day | ORAL | Status: DC
  Administered 2012-02-12 – 2012-02-16 (×5): 100 ug via ORAL
  Filled 2012-02-11 (×7): qty 1

## 2012-02-11 NOTE — ED Notes (Signed)
Per pt woke up this am with right sided chest pain and SOB. sts also dry cough. Recent stress test and was D/C due to increased BP. Denies N,V,D. sts blood in urine but is being treated for that.

## 2012-02-11 NOTE — Consult Note (Signed)
CARDIOLOGY CONSULT NOTE   Patient ID: Tyrone [name] MRN: [id number] DOB/AGE: [birth year] 46 y.o.  Admit date: 02/11/2012  Primary Physician   [name], MD Primary Cardiologist   [facility] Reason for Consultation   Chest pain, SOB  WUJ:Tyrone [name] is a 46 y.o. male with no history of CAD.  Pt was in  [facility] until this am. He woke with SOB at rest, worse with exertion and chest pain   Past Medical History  Diagnosis Date  . External thrombosed hemorrhoids   . Chest pain - S/P ETT on 01/15/2012   . Osteoarthrosis, unspecified whether generalized or localized, ankle and foot     of the knees,ankle and foot  . Guillain-Barre syndrome   . Osteoarthrosis, unspecified whether generalized or localized, lower leg   . Hypersomnia with sleep apnea, unspecified   . Cervicalgia   . Vitamin B 12 deficiency   . Reflux esophagitis   . Urticaria, unspecified   . Sinoatrial node dysfunction   . Morbid obesity   . Hernia of unspecified site of abdominal cavity without mention of obstruction or gangrene     of abdominal  . Unspecified intestinal malabsorption   . Pancytopenia   . Hypertension   . Sleep apnea     CPAP   Past Surgical History  Procedure Date  . Gastric bypass 2004  . Umbilical hernia repair     x 2    No Known Allergies  I have reviewed the patient's current medications    . aspirin  324 mg Oral Once      . sodium chloride 125 mL/hr (02/11/12 1241)    Prior to Admission medications   Medication Sig Start Date End Date Taking? Authorizing Provider  Calcium Carbonate-Vitamin D (CALCIUM 500+D PO) Take 1 tablet by mouth daily.    Yes Historical Provider, MD  Cyanocobalamin (VITAMIN B12 PO) Take 1 tablet by mouth daily.    Yes Historical Provider, MD  losartan (COZAAR) 50 MG tablet Take 1 tablet (50 mg total) by mouth 2 (two) times daily. 12/26/11  Yes Rollene Rotunda, MD  metoprolol tartrate (LOPRESSOR) 25 MG tablet Take 1 tablet (25 mg total) by mouth 2 (two) times daily.  01/18/12 01/17/13 Yes Rollene Rotunda, MD  Multiple Vitamin (MULTIVITAMIN) tablet Take 3 tablets by mouth daily.    Yes Historical Provider, MD  spironolactone (ALDACTONE) 50 MG tablet Take 1 tablet (50 mg total) by mouth daily. 01/18/12 01/17/13 Yes Rollene Rotunda, MD  vitamin E (VITAMIN E) 400 UNIT capsule Take 400 Units by mouth daily.   Yes Historical Provider, MD     History   Social History  . Marital Status: Married    Spouse Name: N/A    Number of Children: 1  . Years of Education: N/A   Occupational History  . Realtor    Social History Main Topics  . Smoking status: Never Smoker   . Smokeless tobacco: Former Neurosurgeon    Types: Chew    Quit date: 11/11/2002  . Alcohol Use: Yes  . Drug Use: Not on file  . Sexually Active: Not on file   Other Topics Concern  . Not on file   Social History Narrative   Lives with wife.       Family History  Problem Relation Age of Onset  . Hypertension Mother   . Hypertension Father   . Heart attack Father 19    Died  . Diabetes Father   . Kidney disease Father  with transplant     ROS:  Full 14 point review of systems complete and found to be negative unless listed above.  Physical Exam: Blood pressure 149/105, temperature 98.2 F (36.8 C), resp. rate 14, SpO2 99.00%.  General: Well developed, well nourished, male in no acute distress Head: Eyes PERRLA, No xanthomas.   Normocephalic and atraumatic, oropharynx without edema or exudate. Dentition Lungs: bilateral  Heart: HRRR S1 S2, no rub/gallop, Heart irregular rate and rhythm with S1, S2  murmur. pulses are 2+ extrem.   Neck: No carotid bruits. No lymphadenopathy.  JVD. Abdomen: Bowel sounds present, abdomen soft and non-tender without masses or hernias noted. Msk:  No spine or cva tenderness. No weakness, no joint deformities or effusions. Extremities: No clubbing or cyanosis.  edema.  Neuro: Alert and oriented X 3. No focal deficits noted. Psych:  Good affect, responds  appropriately Skin: No rashes or lesions noted.  Labs:   Lab Results  Component Value Date   WBC 6.0 02/11/2012   HGB 13.0 02/11/2012   HCT 39.1 02/11/2012   MCV 93.5 02/11/2012   PLT 164 02/11/2012    Basename 02/11/12 1230  INR 1.29    Lab 02/11/12 1230  NA 140  K 4.3  CL 109  CO2 23  BUN 10  CREATININE 0.96  CALCIUM 8.0*  PROT --  BILITOT --  ALKPHOS --  ALT --  AST --  GLUCOSE 98     Basename 02/11/12 1247  TROPIPOC 0.00   Pro B Natriuretic peptide (BNP)  Date/Time Value Range Status  02/11/2012 12:30 PM 1106.0* 0 - 125 pg/mL Final   ECG:  11-Feb-2012 11:55:04 [facility]/ED ROUTINE RECORD Atrial fibrillation Incomplete right bundle branch block Prolonged QT Abnormal ECG 44mm/s 59mm/mV 100Hz  8.0.1 12SL 241 HD CID: 1 Referred by: Unconfirmed Vent. rate 98 BPM PR interval * ms QRS duration 104 ms QT/QTc 380/485 ms P-R-T axes * 23 25 Radiology:  Ct Abdomen Pelvis Wo Contrast  02/11/2012  *RADIOLOGY REPORT*  Clinical Data: Shortness of breath, hematuria, history of gastric bypass  CT ABDOMEN AND PELVIS WITHOUT CONTRAST  Technique:  Multidetector CT imaging of the abdomen and pelvis was performed following the standard protocol without intravenous contrast.  Comparison: None.  Findings: Lung bases are clear.  The heart is top normal in size.  Small hiatal hernia.  Status post gastric bypass.  Unenhanced liver, spleen, pancreas, and adrenal glands are within normal limits.  Gallbladder is grossly unremarkable.  No intrahepatic or extrahepatic ductal dilatation.  Kidneys are unremarkable.  No renal calculi or hydronephrosis.  No evidence of bowel obstruction.  Normal appendix.  No colonic wall thickening or inflammatory changes.  No evidence of abdominal aortic aneurysm.  No abdominopelvic ascites.  Small retroperitoneal lymph nodes pathologic CT size criteria.  Prostate is unremarkable.  No ureteral or bladder calculi.  Mild degenerative changes of the  visualized thoracolumbar spine.  IMPRESSION: No renal, ureteral, or bladder calculi.  No hydronephrosis.  Status post gastric bypass.  No evidence of bowel obstruction.  Original Report Authenticated By: Charline Bills, M.D.   Dg Chest 2 View  02/11/2012  *RADIOLOGY REPORT*  Clinical Data: Chest pain, short of breath, cough  CHEST - 2 VIEW  Comparison: None.  Findings: Prominent heart size with vascular and interstitial prominence.  Component of early edema versus volume overload not excluded.  No focal pneumonia, collapse, consolidation.  No effusion pneumothorax.  Trachea midline.  IMPRESSION: Borderline heart size with vascular and basilar  interstitial prominence.  Original Report Authenticated By: Judie Petit. Ruel Favors, M.D.    ASSESSMENT AND PLAN:   The patient was seen today by Dr Shirlee Latch, the patient evaluated and the data reviewed.  Principal Problem:  *Atrial fibrillation with RVR Active Problems:  Hypertension  Chest pain  Hematuria    Signed: Theodore Demark 02/11/2012, 3:33 PM Co-Sign MD  Patient seen with PA, agree with note.  46 yo with history of OSA and poorly controlled HTN presents with atrial fibrillation/RVR, dyspnea with exertion, and mild chest pain.  He additionally has had several days of hematuria.  1. Atrial fibrillation: With RVR.  New diagnosis.  HR in 120s currently.  He woke up short of breath today but has been gaining weight and has not felt "himself" for the last few days.   - Diltiazem gtt for rate control acutely.  - Continue home metoprolol - CHADSVASC = 2 (CHF, HTN) so would favor anticoagulation.  However, he has been having significant hematuria.  Will hold of anticoagulation for now and use prophylactic dose Lovenox.   - Continue CPAP at night.  2. CHF: Acute diastolic CHF.  He is volume overloaded on exam with elevated BNP.  Suspect this was triggered by atrial fibrillation with RVR.  - Lasix 40 mg IV bid - Echo for LV fxn 3. Hematuria: ? Cause.  Has  been present several days.  Has soaked underwear.  Pink-tinged urine today, not as bad as past days.  Has had right flank pain.  Noncontrast CT (stone-protocol) showed no kidney stone.  He may have passed a stone.  However, in the setting of atrial fibrillation, I am concerned for possible cardioembolism with renal infarction.   - Will get CT abdomen with contrast to look for renal infarct.   - Will need to reassess degree of hematuria as patient will need anticoagulation eventually.  Would go ahead and anticoagulate if he does have a renal infarction as this would be a presumed cardioembolic event.  4. HTN: Continue home meds.   Marca Ancona 02/11/2012 4:31 PM

## 2012-02-11 NOTE — ED Provider Notes (Signed)
History     CSN: [id number]  Arrival date & time 02/11/12  1149   First MD Initiated Contact with Patient 02/11/12 1159      Chief Complaint  Patient presents with  . Chest Pain  . Shortness of Breath    (Consider location/radiation/quality/duration/timing/severity/associated sxs/prior treatment) Patient is a 46 y.o. male presenting with chest pain and shortness of breath. The history is provided by the patient.  Chest Pain Primary symptoms include fatigue and shortness of breath. Pertinent negatives for primary symptoms include no cough and no abdominal pain.    Shortness of Breath  Associated symptoms include chest pain and shortness of breath. Pertinent negatives include no cough.   patient has chest pain shortness of breath that began earlier today. He is described as right-sided pressure. No fevers. No cough. He states he feels a little off overall. No nausea vomiting or diarrhea. He has had some blood in the urine the last few days. He's also had some mid back pain. It is midline there does radiate to the right side somewhat. Patient states he saw his primary care Dr. and told it was probably kidney stone. He was given some pain medicines but has not had to take them. He never previously had atrial fibrillation. Has had issues with his blood pressure is seen the [facility] cardiology for her. No recent travel.  Past Medical History  Diagnosis Date  . External thrombosed hemorrhoids   . Chest pain   . Osteoarthrosis, unspecified whether generalized or localized, ankle and foot     of the knees,ankle and foot  . Guillain-Barre syndrome   . Osteoarthrosis, unspecified whether generalized or localized, lower leg   . Hypersomnia with sleep apnea, unspecified   . Cervicalgia   . Vitamin B 12 deficiency   . Reflux esophagitis   . Urticaria, unspecified   . Sinoatrial node dysfunction   . Morbid obesity   . Hernia of unspecified site of abdominal cavity without mention of obstruction  or gangrene     of abdominal  . Unspecified intestinal malabsorption   . Pancytopenia   . Hypertension   . Sleep apnea     CPAP    Past Surgical History  Procedure Date  . Gastric bypass 2004  . Umbilical hernia repair     x 2    Family History  Problem Relation Age of Onset  . Hypertension Mother   . Hypertension Father   . Heart attack Father 58    Died  . Diabetes Father   . Kidney disease Father     with transplant    History  Substance Use Topics  . Smoking status: Never Smoker   . Smokeless tobacco: Former Neurosurgeon    Types: Chew    Quit date: 11/11/2002  . Alcohol Use: Yes      Review of Systems  Constitutional: Positive for fatigue. Negative for chills.  Eyes: Negative for pain.  Respiratory: Positive for shortness of breath. Negative for cough.   Cardiovascular: Positive for chest pain.  Gastrointestinal: Negative for abdominal pain.  Genitourinary: Positive for hematuria and flank pain. Negative for penile pain.  Musculoskeletal: Positive for back pain.  Neurological: Negative for headaches.  Psychiatric/Behavioral: Negative for agitation.    Allergies  Review of patient's allergies indicates no known allergies.  Home Medications   Current Outpatient Rx  Name Route Sig Dispense Refill  . CALCIUM 500+D PO Oral Take 1 tablet by mouth daily.     Marland Kitchen VITAMIN B12  PO Oral Take 1 tablet by mouth daily.     Marland Kitchen LOSARTAN POTASSIUM 50 MG PO TABS Oral Take 1 tablet (50 mg total) by mouth 2 (two) times daily. 60 tablet 6    Dose increased 12/26/2011.  Marland Kitchen METOPROLOL TARTRATE 25 MG PO TABS Oral Take 1 tablet (25 mg total) by mouth 2 (two) times daily. 60 tablet 6  . ONE-DAILY MULTI VITAMINS PO TABS Oral Take 3 tablets by mouth daily.     Marland Kitchen SPIRONOLACTONE 50 MG PO TABS Oral Take 1 tablet (50 mg total) by mouth daily. 30 tablet 12  . VITAMIN E 400 UNITS PO CAPS Oral Take 400 Units by mouth daily.      BP 149/105  Temp 98.2 F (36.8 C)  Resp 14  SpO2  99%  Physical Exam  Nursing note and vitals reviewed. Constitutional: He is oriented to person, place, and time. He appears well-developed and well-nourished.  HENT:  Head: Normocephalic and atraumatic.  Eyes: EOM are normal. Pupils are equal, round, and reactive to light.  Neck: Normal range of motion. Neck supple.  Cardiovascular: Normal heart sounds and intact distal pulses.   No murmur heard.      Irregular rhythm. Rate fluctuates and sometimes tachycardia.  Pulmonary/Chest: Effort normal and breath sounds normal.  Abdominal: Soft. Bowel sounds are normal. He exhibits no distension and no mass. There is no tenderness. There is no rebound and no guarding.  Genitourinary:       No CVA tenderness. No rash.  Musculoskeletal: Normal range of motion. He exhibits no edema.  Neurological: He is alert and oriented to person, place, and time. No cranial nerve deficit.  Skin: Skin is warm and dry.  Psychiatric: He has a normal mood and affect.    ED Course  Procedures (including critical care time)  Labs Reviewed  PRO B NATRIURETIC PEPTIDE - Abnormal; Notable for the following:    Pro B Natriuretic peptide (BNP) 1106.0 (*)     All other components within normal limits  CBC WITH DIFFERENTIAL - Abnormal; Notable for the following:    RBC 4.18 (*)     All other components within normal limits  BASIC METABOLIC PANEL - Abnormal; Notable for the following:    Calcium 8.0 (*)     All other components within normal limits  URINALYSIS, ROUTINE W REFLEX MICROSCOPIC - Abnormal; Notable for the following:    APPearance CLOUDY (*)     Hgb urine dipstick LARGE (*)     All other components within normal limits  PROTIME-INR - Abnormal; Notable for the following:    Prothrombin Time 16.3 (*)     All other components within normal limits  APTT  POCT I-STAT TROPONIN I  URINE MICROSCOPIC-ADD ON   Ct Abdomen Pelvis Wo Contrast  02/11/2012  *RADIOLOGY REPORT*  Clinical Data: Shortness of breath,  hematuria, history of gastric bypass  CT ABDOMEN AND PELVIS WITHOUT CONTRAST  Technique:  Multidetector CT imaging of the abdomen and pelvis was performed following the standard protocol without intravenous contrast.  Comparison: None.  Findings: Lung bases are clear.  The heart is top normal in size.  Small hiatal hernia.  Status post gastric bypass.  Unenhanced liver, spleen, pancreas, and adrenal glands are within normal limits.  Gallbladder is grossly unremarkable.  No intrahepatic or extrahepatic ductal dilatation.  Kidneys are unremarkable.  No renal calculi or hydronephrosis.  No evidence of bowel obstruction.  Normal appendix.  No colonic wall thickening or inflammatory changes.  No evidence of abdominal aortic aneurysm.  No abdominopelvic ascites.  Small retroperitoneal lymph nodes pathologic CT size criteria.  Prostate is unremarkable.  No ureteral or bladder calculi.  Mild degenerative changes of the visualized thoracolumbar spine.  IMPRESSION: No renal, ureteral, or bladder calculi.  No hydronephrosis.  Status post gastric bypass.  No evidence of bowel obstruction.  Original Report Authenticated By: Charline Bills, M.D.   Dg Chest 2 View  02/11/2012  *RADIOLOGY REPORT*  Clinical Data: Chest pain, short of breath, cough  CHEST - 2 VIEW  Comparison: None.  Findings: Prominent heart size with vascular and interstitial prominence.  Component of early edema versus volume overload not excluded.  No focal pneumonia, collapse, consolidation.  No effusion pneumothorax.  Trachea midline.  IMPRESSION: Borderline heart size with vascular and basilar interstitial prominence.  Original Report Authenticated By: Judie Petit. Ruel Favors, M.D.     1. Atrial fibrillation with RVR   2. Hematuria      Date: 02/11/2012  Rate: 98  Rhythm: atrial fibrillation  QRS Axis: normal  Intervals: afib  ST/T Wave abnormalities: normal  Conduction Disutrbances:imcomplete RBBB  Narrative Interpretation: afib is new  Old EKG  Reviewed: changes noted    MDM  Patient with newonset afib with RVR. Rate mildly elevated. Also has had hematuria. Labs reassuring. Ct does not show a cause for hematuria. Admitted to cardiology        Juliet Rude. Rubin Payor, MD 02/11/12 1625

## 2012-02-11 NOTE — ED Notes (Signed)
Patient is resting.  No complaints of pain.  Patient family at bedside.  Patient continues to have uncontrolled afib.  Denies any chest pain

## 2012-02-12 ENCOUNTER — Encounter (HOSPITAL_COMMUNITY): Payer: Self-pay

## 2012-02-12 ENCOUNTER — Inpatient Hospital Stay (HOSPITAL_COMMUNITY): Payer: BC Managed Care – PPO

## 2012-02-12 DIAGNOSIS — I319 Disease of pericardium, unspecified: Secondary | ICD-10-CM

## 2012-02-12 LAB — BASIC METABOLIC PANEL
BUN: 10 mg/dL (ref 6–23)
CO2: 23 mEq/L (ref 19–32)
Calcium: 8.1 mg/dL — ABNORMAL LOW (ref 8.4–10.5)
Chloride: 107 mEq/L (ref 96–112)
Creatinine, Ser: 0.81 mg/dL (ref 0.50–1.35)
GFR calc Af Amer: >90 mL/min (ref >90)
GFR calc non Af Amer: >90 mL/min (ref >90)
Glucose, Bld: 97 mg/dL (ref 70–99)
Potassium: 3.5 mEq/L (ref 3.5–5.1)
Sodium: 142 mEq/L (ref 135–145)

## 2012-02-12 LAB — CBC
HCT: 36.6 % — ABNORMAL LOW (ref 39.0–52.0)
HCT: 39.6 % (ref 39.0–52.0)
Hemoglobin: 12.5 g/dL — ABNORMAL LOW (ref 13.0–17.0)
Hemoglobin: 13.4 g/dL (ref 13.0–17.0)
MCH: 31.8 pg (ref 26.0–34.0)
MCH: 31.7 pg (ref 26.0–34.0)
MCHC: 34.2 g/dL (ref 30.0–36.0)
MCHC: 33.8 g/dL (ref 30.0–36.0)
MCV: 93.1 fL (ref 78.0–100.0)
MCV: 93.6 fL (ref 78.0–100.0)
Platelets: 137 10*3/uL — ABNORMAL LOW (ref 150–400)
Platelets: 145 10*3/uL — ABNORMAL LOW (ref 150–400)
RBC: 3.93 MIL/uL — ABNORMAL LOW (ref 4.22–5.81)
RBC: 4.23 MIL/uL (ref 4.22–5.81)
RDW: 14.1 % (ref 11.5–15.5)
RDW: 14.1 % (ref 11.5–15.5)
WBC: 6.4 10*3/uL (ref 4.0–10.5)
WBC: 6.2 10*3/uL (ref 4.0–10.5)

## 2012-02-12 LAB — CARDIAC PANEL(CRET KIN+CKTOT+MB+TROPI)
CK, MB: 1.6 ng/mL (ref 0.3–4.0)
CK, MB: 1.2 ng/mL (ref 0.3–4.0)
CK, MB: 1.2 ng/mL (ref 0.3–4.0)
Relative Index: INVALID (ref 0.0–2.5)
Relative Index: INVALID (ref 0.0–2.5)
Relative Index: INVALID (ref 0.0–2.5)
Total CK: 53 U/L (ref 7–232)
Total CK: 57 U/L (ref 7–232)
Total CK: 57 U/L (ref 7–232)
Troponin I: <0.3 ng/mL (ref <0.30)
Troponin I: <0.3 ng/mL (ref <0.30)
Troponin I: <0.3 ng/mL (ref <0.30)

## 2012-02-12 LAB — COMPREHENSIVE METABOLIC PANEL
ALT: 22 U/L (ref 0–53)
AST: 13 U/L (ref 0–37)
Albumin: 3.6 g/dL (ref 3.5–5.2)
Alkaline Phosphatase: 95 U/L (ref 39–117)
BUN: 10 mg/dL (ref 6–23)
CO2: 29 mEq/L (ref 19–32)
Calcium: 8.5 mg/dL (ref 8.4–10.5)
Chloride: 104 mEq/L (ref 96–112)
Creatinine, Ser: 0.98 mg/dL (ref 0.50–1.35)
GFR calc Af Amer: >90 mL/min (ref >90)
GFR calc non Af Amer: >90 mL/min (ref >90)
Glucose, Bld: 127 mg/dL — ABNORMAL HIGH (ref 70–99)
Potassium: 3.8 mEq/L (ref 3.5–5.1)
Sodium: 142 mEq/L (ref 135–145)
Total Bilirubin: 0.5 mg/dL (ref 0.3–1.2)
Total Protein: 6.5 g/dL (ref 6.0–8.3)

## 2012-02-12 LAB — HEPATIC FUNCTION PANEL
ALT: 25 U/L (ref 0–53)
AST: 15 U/L (ref 0–37)
Albumin: 3.7 g/dL (ref 3.5–5.2)
Alkaline Phosphatase: 101 U/L (ref 39–117)
Bilirubin, Direct: 0.2 mg/dL (ref 0.0–0.3)
Indirect Bilirubin: 0.5 mg/dL (ref 0.3–0.9)
Total Bilirubin: 0.7 mg/dL (ref 0.3–1.2)
Total Protein: 6.8 g/dL (ref 6.0–8.3)

## 2012-02-12 LAB — MAGNESIUM: Magnesium: 1.9 mg/dL (ref 1.5–2.5)

## 2012-02-12 LAB — HEPARIN LEVEL (UNFRACTIONATED)
Heparin Unfractionated: <0.1 IU/mL — ABNORMAL LOW (ref 0.30–0.70)
Heparin Unfractionated: 0.34 IU/mL (ref 0.30–0.70)

## 2012-02-12 LAB — MRSA PCR SCREENING: MRSA by PCR: NEGATIVE

## 2012-02-12 LAB — D-DIMER, QUANTITATIVE: D-Dimer, Quant: 0.35 ug/mL-FEU (ref 0.00–0.48)

## 2012-02-12 LAB — TSH: TSH: 2.877 u[IU]/mL (ref 0.350–4.500)

## 2012-02-12 MED ORDER — SODIUM CHLORIDE 0.9 % IV SOLN
INTRAVENOUS | Status: DC
  Administered 2012-02-12: 06:00:00 via INTRAVENOUS

## 2012-02-12 MED ORDER — SODIUM CHLORIDE 0.9 % IV SOLN
INTRAVENOUS | Status: DC
  Administered 2012-02-13: 05:00:00 via INTRAVENOUS

## 2012-02-12 MED ORDER — ASPIRIN 81 MG PO CHEW
81.0000 mg | CHEWABLE_TABLET | Freq: Every day | ORAL | Status: DC
  Administered 2012-02-12 – 2012-02-14 (×2): 81 mg via ORAL
  Filled 2012-02-12 (×2): qty 1

## 2012-02-12 MED ORDER — POTASSIUM CHLORIDE CRYS ER 20 MEQ PO TBCR
40.0000 meq | EXTENDED_RELEASE_TABLET | Freq: Once | ORAL | Status: AC
  Administered 2012-02-12: 40 meq via ORAL
  Filled 2012-02-12: qty 2

## 2012-02-12 MED ORDER — LOSARTAN POTASSIUM 50 MG PO TABS
50.0000 mg | ORAL_TABLET | Freq: Two times a day (BID) | ORAL | Status: DC
  Administered 2012-02-12 – 2012-02-16 (×9): 50 mg via ORAL
  Filled 2012-02-12 (×10): qty 1

## 2012-02-12 MED ORDER — ZOLPIDEM TARTRATE 5 MG PO TABS: 5.0000 mg | ORAL_TABLET | Freq: Every evening | ORAL | Status: DC | PRN

## 2012-02-12 MED ORDER — METOPROLOL TARTRATE 50 MG PO TABS
50.0000 mg | ORAL_TABLET | Freq: Two times a day (BID) | ORAL | Status: DC
  Administered 2012-02-12 (×2): 50 mg via ORAL
  Filled 2012-02-12 (×4): qty 1

## 2012-02-12 MED ORDER — ONDANSETRON HCL 4 MG/2ML IJ SOLN: 4.0000 mg | Freq: Four times a day (QID) | INTRAMUSCULAR | Status: DC | PRN

## 2012-02-12 MED ORDER — IOHEXOL 300 MG/ML  SOLN
100.0000 mL | Freq: Once | INTRAMUSCULAR | Status: AC | PRN
  Administered 2012-02-12: 100 mL via INTRAVENOUS

## 2012-02-12 MED ORDER — HEPARIN (PORCINE) IN NACL 100-0.45 UNIT/ML-% IJ SOLN
1650.0000 [IU]/h | INTRAMUSCULAR | Status: DC
  Administered 2012-02-12: 1650 [IU]/h via INTRAVENOUS
  Administered 2012-02-12: 1400 [IU]/h via INTRAVENOUS
  Filled 2012-02-12 (×4): qty 250

## 2012-02-12 MED ORDER — ENOXAPARIN SODIUM 40 MG/0.4ML ~~LOC~~ SOLN
40.0000 mg | SUBCUTANEOUS | Status: DC
  Filled 2012-02-12: qty 0.4

## 2012-02-12 MED ORDER — ACETAMINOPHEN 325 MG PO TABS: 650.0000 mg | ORAL_TABLET | ORAL | Status: DC | PRN

## 2012-02-12 MED ORDER — ASPIRIN 81 MG PO CHEW
324.0000 mg | CHEWABLE_TABLET | ORAL | Status: AC
  Administered 2012-02-13: 324 mg via ORAL
  Filled 2012-02-12: qty 4

## 2012-02-12 MED ORDER — SPIRONOLACTONE 25 MG PO TABS
25.0000 mg | ORAL_TABLET | Freq: Every day | ORAL | Status: DC
  Administered 2012-02-12 – 2012-02-16 (×5): 25 mg via ORAL
  Filled 2012-02-12 (×5): qty 1

## 2012-02-12 MED ORDER — ALPRAZOLAM 0.25 MG PO TABS: 0.2500 mg | ORAL_TABLET | Freq: Two times a day (BID) | ORAL | Status: DC | PRN

## 2012-02-12 NOTE — Consult Note (Signed)
Consult hematuria Requested by Dr. Jens Som  History of Present Illness: Pt admitted with chest pain and found to be in Atrial fibrillation with RVR.  He is for coronary angiography in the morning. He is on Heparin and his urine is grossly clear now but he continues to have microhematuria. Last week he had an episode of red urine associated with some back pain and dysuria. He had no fever and symptoms cleared without antibiotics.   He has no GU history or history of stones. He has no smoking history. He has no exposure to chemotherapy, radiation, dye or solvent exposure.    Past Medical History  Diagnosis Date  . External thrombosed hemorrhoids   . Chest pain   . Osteoarthrosis, unspecified whether generalized or localized, ankle and foot     of the knees,ankle and foot  . Guillain-Barre syndrome   . Osteoarthrosis, unspecified whether generalized or localized, lower leg   . Hypersomnia with sleep apnea, unspecified   . Cervicalgia   . Vitamin B 12 deficiency   . Reflux esophagitis   . Urticaria, unspecified   . Sinoatrial node dysfunction   . Morbid obesity   . Hernia of unspecified site of abdominal cavity without mention of obstruction or gangrene     of abdominal  . Unspecified intestinal malabsorption   . Pancytopenia   . Hypertension   . Sleep apnea     CPAP   Past Surgical History  Procedure Date  . Gastric bypass 2004  . Umbilical hernia repair     x 2  . Hernia repair     Home Medications:  Prescriptions prior to admission  Medication Sig Dispense Refill  . Calcium Carbonate-Vitamin D (CALCIUM 500+D PO) Take 1 tablet by mouth daily.       . Cyanocobalamin (VITAMIN B12 PO) Take 1 tablet by mouth daily.       Marland Kitchen losartan (COZAAR) 50 MG tablet Take 1 tablet (50 mg total) by mouth 2 (two) times daily.  60 tablet  6  . metoprolol tartrate (LOPRESSOR) 25 MG tablet Take 1 tablet (25 mg total) by mouth 2 (two) times daily.  60 tablet  6  . Multiple Vitamin  (MULTIVITAMIN) tablet Take 3 tablets by mouth daily.       Marland Kitchen spironolactone (ALDACTONE) 50 MG tablet Take 1 tablet (50 mg total) by mouth daily.  30 tablet  12  . vitamin E (VITAMIN E) 400 UNIT capsule Take 400 Units by mouth daily.       Allergies: No Known Allergies  Family History  Problem Relation Age of Onset  . Hypertension Mother   . Hypertension Father   . Heart attack Father 41    Died  . Diabetes Father   . Kidney disease Father     with transplant   Social History:  reports that he has never smoked. He quit smokeless tobacco use about 9 years ago. His smokeless tobacco use included Chew. He reports that he drinks alcohol. His drug history not on file.  ROS: A complete review of systems was performed.  All systems are negative except for pertinent findings as noted. +SOB.   Physical Exam:  Vital signs in last 24 hours: Temp:  [97.4 F (36.3 C)-99 F (37.2 C)] 97.5 F (36.4 C) (07/22 2000) Pulse Rate:  [52-145] 65  (07/22 2000) Resp:  [18-24] 18  (07/22 2000) BP: (112-131)/(75-118) 112/75 mmHg (07/22 2000) SpO2:  [93 %-97 %] 97 % (07/22 2000) Weight:  [112.8 kg (  248 lb 10.9 oz)] 112.8 kg (248 lb 10.9 oz) (07/22 0300) General:  Alert and oriented, No acute distress HEENT: Normocephalic, atraumatic Neck: No JVD or lymphadenopathy Cardiovascular: Irregular Lungs: Regular rate and effort Abdomen: Soft, nontender, nondistended, no abdominal masses Back: No CVA tenderness Extremities: Trace edema Neurologic: Grossly intact  Laboratory Data:  Results for orders placed during the hospital encounter of 02/11/12 (from the past 24 hour(s))  MRSA PCR SCREENING     Status: Normal   Collection Time   02/12/12  2:27 AM      Component Value Range   MRSA by PCR NEGATIVE  NEGATIVE  BASIC METABOLIC PANEL     Status: Abnormal   Collection Time   02/12/12  5:15 AM      Component Value Range   Sodium 142  135 - 145 mEq/L   Potassium 3.5  3.5 - 5.1 mEq/L   Chloride 107  96 -  112 mEq/L   CO2 23  19 - 32 mEq/L   Glucose, Bld 97  70 - 99 mg/dL   BUN 10  6 - 23 mg/dL   Creatinine, Ser 0.45  0.50 - 1.35 mg/dL   Calcium 8.1 (*) 8.4 - 10.5 mg/dL   GFR calc non Af Amer >90  >90 mL/min   GFR calc Af Amer >90  >90 mL/min  MAGNESIUM     Status: Normal   Collection Time   02/12/12  5:15 AM      Component Value Range   Magnesium 1.9  1.5 - 2.5 mg/dL  CBC     Status: Abnormal   Collection Time   02/12/12  5:15 AM      Component Value Range   WBC 6.4  4.0 - 10.5 K/uL   RBC 3.93 (*) 4.22 - 5.81 MIL/uL   Hemoglobin 12.5 (*) 13.0 - 17.0 g/dL   HCT 40.9 (*) 81.1 - 91.4 %   MCV 93.1  78.0 - 100.0 fL   MCH 31.8  26.0 - 34.0 pg   MCHC 34.2  30.0 - 36.0 g/dL   RDW 78.2  95.6 - 21.3 %   Platelets 137 (*) 150 - 400 K/uL  CARDIAC PANEL(CRET KIN+CKTOT+MB+TROPI)     Status: Normal   Collection Time   02/12/12  5:15 AM      Component Value Range   Total CK 53  7 - 232 U/L   CK, MB 1.6  0.3 - 4.0 ng/mL   Troponin I <0.30  <0.30 ng/mL   Relative Index RELATIVE INDEX IS INVALID  0.0 - 2.5  HEPARIN LEVEL (UNFRACTIONATED)     Status: Abnormal   Collection Time   02/12/12 11:57 AM      Component Value Range   Heparin Unfractionated <0.10 (*) 0.30 - 0.70 IU/mL  HEPATIC FUNCTION PANEL     Status: Normal   Collection Time   02/12/12 11:57 AM      Component Value Range   Total Protein 6.8  6.0 - 8.3 g/dL   Albumin 3.7  3.5 - 5.2 g/dL   AST 15  0 - 37 U/L   ALT 25  0 - 53 U/L   Alkaline Phosphatase 101  39 - 117 U/L   Total Bilirubin 0.7  0.3 - 1.2 mg/dL   Bilirubin, Direct 0.2  0.0 - 0.3 mg/dL   Indirect Bilirubin 0.5  0.3 - 0.9 mg/dL  CARDIAC PANEL(CRET KIN+CKTOT+MB+TROPI)     Status: Normal   Collection Time   02/12/12 12:34  PM      Component Value Range   Total CK 57  7 - 232 U/L   CK, MB 1.2  0.3 - 4.0 ng/mL   Troponin I <0.30  <0.30 ng/mL   Relative Index RELATIVE INDEX IS INVALID  0.0 - 2.5  D-DIMER, QUANTITATIVE     Status: Normal   Collection Time   02/12/12  2:46  PM      Component Value Range   D-Dimer, Quant 0.35  0.00 - 0.48 ug/mL-FEU  CARDIAC PANEL(CRET KIN+CKTOT+MB+TROPI)     Status: Normal   Collection Time   02/12/12  6:22 PM      Component Value Range   Total CK 57  7 - 232 U/L   CK, MB 1.2  0.3 - 4.0 ng/mL   Troponin I <0.30  <0.30 ng/mL   Relative Index RELATIVE INDEX IS INVALID  0.0 - 2.5  CBC     Status: Abnormal   Collection Time   02/12/12  8:32 PM      Component Value Range   WBC 6.2  4.0 - 10.5 K/uL   RBC 4.23  4.22 - 5.81 MIL/uL   Hemoglobin 13.4  13.0 - 17.0 g/dL   HCT 16.1  09.6 - 04.5 %   MCV 93.6  78.0 - 100.0 fL   MCH 31.7  26.0 - 34.0 pg   MCHC 33.8  30.0 - 36.0 g/dL   RDW 40.9  81.1 - 91.4 %   Platelets 145 (*) 150 - 400 K/uL  HEPARIN LEVEL (UNFRACTIONATED)     Status: Normal   Collection Time   02/12/12  8:32 PM      Component Value Range   Heparin Unfractionated 0.34  0.30 - 0.70 IU/mL   Recent Results (from the past 240 hour(s))  MRSA PCR SCREENING     Status: Normal   Collection Time   02/12/12  2:27 AM      Component Value Range Status Comment   MRSA by PCR NEGATIVE  NEGATIVE Final    Creatinine:  Basename 02/12/12 0515 02/11/12 1230  CREATININE 0.81 0.96   CT A/P without contrast -no significant GU findings. CT A/P with contrast - no significant GU findings, small prostate Impression/Assessment:  Gross hematuria - possible passed stone, prostatitis or small neoplasm - would proceed with cardiac evaluation and treatment as planned. I told the patient to expect some hematuria, but in most cases it is mild and should not effect kidney or bladder functions.  Plan:  Discussed with patient and family negative GU CT findings. Discussed importance of outpatient follow-up for repeat urinalysis and to consider cystoscopy. We discussed the nature, benefits, risks and rationale for performing cystoscopy after gross or microscopic hematuria. I gave him my card and contact information and again instructed him to  follow-up with me.   Tyrone [name] 02/12/2012, 9:20 PM

## 2012-02-12 NOTE — ED Notes (Signed)
See paper charting 

## 2012-02-12 NOTE — Progress Notes (Signed)
Discussed echo, CT abd/pelvis results with Dr. Jens Som. 2D echo shows EF 35-40%, diffuse hypokinesis, mildly dilated RV, small pericardial effusion. Per our discussion, will add d-dimer given recent travel/RV dilitation, obtain urology eval for hematuria since CT was negative for renal infarct, and proceed with L&R heart cath tomorrow to evaluate for CAD. The plan was discussed with the patient including risks/benefits and he is in agreement. Kwan Shellhammer PA-C

## 2012-02-12 NOTE — Progress Notes (Signed)
ANTICOAGULATION CONSULT NOTE - Initial Consult  Pharmacy Consult for heparin Indication: atrial fibrillation  No Known Allergies  Patient Measurements: Height: 5\' 6"  (167.6 cm) Weight: 248 lb 10.9 oz (112.8 kg) IBW/kg (Calculated) : 63.8  Heparin Dosing Weight: 93kg  Labs:  Basename 02/12/12 1234 02/12/12 1157 02/12/12 0515 02/11/12 1230  HGB -- -- 12.5* 13.0  HCT -- -- 36.6* 39.1  PLT -- -- 137* 164  APTT -- -- -- 26  LABPROT -- -- -- 16.3*  INR -- -- -- 1.29  HEPARINUNFRC -- <0.10* -- --  CREATININE -- -- 0.81 0.96  CKTOTAL 57 -- 53 --  CKMB 1.2 -- 1.6 --  TROPONINI <0.30 -- <0.30 --    Estimated Creatinine Clearance: 133 ml/min (by C-G formula based on Cr of 0.81).   Medical History: Past Medical History  Diagnosis Date  . External thrombosed hemorrhoids   . Chest pain   . Osteoarthrosis, unspecified whether generalized or localized, ankle and foot     of the knees,ankle and foot  . Guillain-Barre syndrome   . Osteoarthrosis, unspecified whether generalized or localized, lower leg   . Hypersomnia with sleep apnea, unspecified   . Cervicalgia   . Vitamin B 12 deficiency   . Reflux esophagitis   . Urticaria, unspecified   . Sinoatrial node dysfunction   . Morbid obesity   . Hernia of unspecified site of abdominal cavity without mention of obstruction or gangrene     of abdominal  . Unspecified intestinal malabsorption   . Pancytopenia   . Hypertension   . Sleep apnea     CPAP    Medications:  Prescriptions prior to admission  Medication Sig Dispense Refill  . Calcium Carbonate-Vitamin D (CALCIUM 500+D PO) Take 1 tablet by mouth daily.       . Cyanocobalamin (VITAMIN B12 PO) Take 1 tablet by mouth daily.       Marland Kitchen losartan (COZAAR) 50 MG tablet Take 1 tablet (50 mg total) by mouth 2 (two) times daily.  60 tablet  6  . metoprolol tartrate (LOPRESSOR) 25 MG tablet Take 1 tablet (25 mg total) by mouth 2 (two) times daily.  60 tablet  6  . Multiple Vitamin  (MULTIVITAMIN) tablet Take 3 tablets by mouth daily.       Marland Kitchen spironolactone (ALDACTONE) 50 MG tablet Take 1 tablet (50 mg total) by mouth daily.  30 tablet  12  . vitamin E (VITAMIN E) 400 UNIT capsule Take 400 Units by mouth daily.       Scheduled:     . aspirin  81 mg Oral Daily  . calcium-vitamin D  1 tablet Oral Daily  . furosemide  40 mg Intravenous BID  . losartan  50 mg Oral BID  . metoprolol tartrate  50 mg Oral BID  . multivitamin with minerals  3 tablet Oral Daily  . potassium chloride  40 mEq Oral Once  . spironolactone  25 mg Oral Daily  . vitamin B-12  100 mcg Oral Daily  . vitamin E  400 Units Oral Daily  . DISCONTD: enoxaparin (LOVENOX) injection  40 mg Subcutaneous Q24H  . DISCONTD: metoprolol tartrate  25 mg Oral BID  . DISCONTD: spironolactone  50 mg Oral Daily    Assessment: 46yo male with new onset Afib on heparin infusion. Heparin level < 0.1 on 1400 units/hr, noted that pt has hematuria on admission without specific cause determined, H/H wnl. Abd CT neg for renal infarct. Plan for cardiac cath +  TEE guided cardioversion. Likely will also need long-term anticoagulation. CBC stable, no bleeding noted, no interruption with infusion per RN.  Goal of Therapy:  Heparin level 0.3-0.7 units/ml Monitor platelets by anticoagulation protocol: Yes   Plan:  - Will increase heparin gtt to1650 units/hr without bolus given hematuria  - f/u 6 hr heparin level at 2030  Riki Rusk PharmD BCPS 02/12/2012,2:01 PM

## 2012-02-12 NOTE — Progress Notes (Signed)
ANTICOAGULATION CONSULT NOTE - Initial Consult  Pharmacy Consult for heparin Indication: atrial fibrillation  No Known Allergies  Patient Measurements: Height: 5\' 6"  (167.6 cm) Weight: 248 lb 10.9 oz (112.8 kg) IBW/kg (Calculated) : 63.8  Heparin Dosing Weight: 93kg  Labs:  Basename 02/11/12 1230  HGB 13.0  HCT 39.1  PLT 164  APTT 26  LABPROT 16.3*  INR 1.29  HEPARINUNFRC --  CREATININE 0.96  CKTOTAL --  CKMB --  TROPONINI --    Estimated Creatinine Clearance: 112.2 ml/min (by C-G formula based on Cr of 0.96).   Medical History: Past Medical History  Diagnosis Date  . External thrombosed hemorrhoids   . Chest pain   . Osteoarthrosis, unspecified whether generalized or localized, ankle and foot     of the knees,ankle and foot  . Guillain-Barre syndrome   . Osteoarthrosis, unspecified whether generalized or localized, lower leg   . Hypersomnia with sleep apnea, unspecified   . Cervicalgia   . Vitamin B 12 deficiency   . Reflux esophagitis   . Urticaria, unspecified   . Sinoatrial node dysfunction   . Morbid obesity   . Hernia of unspecified site of abdominal cavity without mention of obstruction or gangrene     of abdominal  . Unspecified intestinal malabsorption   . Pancytopenia   . Hypertension   . Sleep apnea     CPAP    Medications:  Prescriptions prior to admission  Medication Sig Dispense Refill  . Calcium Carbonate-Vitamin D (CALCIUM 500+D PO) Take 1 tablet by mouth daily.       . Cyanocobalamin (VITAMIN B12 PO) Take 1 tablet by mouth daily.       Marland Kitchen losartan (COZAAR) 50 MG tablet Take 1 tablet (50 mg total) by mouth 2 (two) times daily.  60 tablet  6  . metoprolol tartrate (LOPRESSOR) 25 MG tablet Take 1 tablet (25 mg total) by mouth 2 (two) times daily.  60 tablet  6  . Multiple Vitamin (MULTIVITAMIN) tablet Take 3 tablets by mouth daily.       Marland Kitchen spironolactone (ALDACTONE) 50 MG tablet Take 1 tablet (50 mg total) by mouth daily.  30 tablet  12    . vitamin E (VITAMIN E) 400 UNIT capsule Take 400 Units by mouth daily.       Scheduled:    . aspirin  324 mg Oral Once  . calcium-vitamin D  1 tablet Oral Daily  . furosemide  40 mg Intravenous BID  . losartan  50 mg Oral BID  . metoprolol tartrate  25 mg Oral BID  . multivitamin with minerals  3 tablet Oral Daily  . spironolactone  50 mg Oral Daily  . vitamin B-12  100 mcg Oral Daily  . vitamin E  400 Units Oral Daily    Assessment: 46yo male c/o fatigue and SOB, found to be in new-onset Afib w/ RVR, to begin heparin; noted that pt has hematuria on admission without specific cause determined, H/H wnl.  Goal of Therapy:  Heparin level 0.3-0.7 units/ml Monitor platelets by anticoagulation protocol: Yes   Plan:  Will start heparin gtt at 1400 units/hr without bolus given hematuria and monitor heparin levels and CBC.  Colleen Can PharmD BCPS 02/12/2012,4:38 AM

## 2012-02-12 NOTE — Progress Notes (Signed)
Utilization Review Completed.  Tyrone [name]  02/12/2012  

## 2012-02-12 NOTE — Progress Notes (Addendum)
Patient: Tyrone [name] Date of Encounter: 02/12/2012, 9:33 AM Admit date: 02/11/2012     Subjective  Symptoms have improved. Chest pain feels like a "cup of water" on his chest. SOB improved. LEE resolving.  As he has diuresed, his urine has cleared.   Objective   Telemetry: atrial fib RVR rates 100-120, occ PVCs/couplets Physical Exam: Filed Vitals:   02/12/12 0700  BP: 120/81  Pulse: 145  Temp: 98.4 F (36.9 C)  Resp: 20   General: Well developed, well nourished, in no acute distress. Head: Normal Neck: Supple Lungs: Clear bilaterally to auscultation without wheezes, rales, or rhonchi. Breathing is unlabored. Heart: Irregular Abdomen: Soft, non-tender, non-distended  Extremities: Trace edema.   Neuro: Alert and oriented X 3. Moves all extremities spontaneously.    Intake/Output Summary (Last 24 hours) at 02/12/12 0933 Last data filed at 02/12/12 0800  Gross per 24 hour  Intake     72 ml  Output   2800 ml  Net  -2728 ml    Inpatient Medications:    . aspirin  324 mg Oral Once  . calcium-vitamin D  1 tablet Oral Daily  . enoxaparin (LOVENOX) injection  40 mg Subcutaneous Q24H  . furosemide  40 mg Intravenous BID  . losartan  50 mg Oral BID  . metoprolol tartrate  25 mg Oral BID  . multivitamin with minerals  3 tablet Oral Daily  . spironolactone  50 mg Oral Daily  . vitamin B-12  100 mcg Oral Daily  . vitamin E  400 Units Oral Daily    Labs:  [facility] 02/12/12 0515 02/11/12 1230  NA 142 140  K 3.5 4.3  CL 107 109  CO2 23 23  GLUCOSE 97 98  BUN 10 10  CREATININE 0.81 0.96  CALCIUM 8.1* 8.0*  MG 1.9 --  PHOS -- --    Basename 02/12/12 0515 02/11/12 1230  WBC 6.4 6.0  NEUTROABS -- 3.8  HGB 12.5* 13.0  HCT 36.6* 39.1  MCV 93.1 93.5  PLT 137* 164    Basename 02/12/12 0515  CKTOTAL 53  CKMB 1.6  TROPONINI <0.30   Radiology/Studies:  Ct Abdomen Pelvis Wo Contrast 02/11/2012  *RADIOLOGY REPORT*  Clinical Data: Shortness of breath, hematuria,  history of gastric bypass  CT ABDOMEN AND PELVIS WITHOUT CONTRAST  Technique:  Multidetector CT imaging of the abdomen and pelvis was performed following the standard protocol without intravenous contrast.  Comparison: None.  Findings: Lung bases are clear.  The heart is top normal in size.  Small hiatal hernia.  Status post gastric bypass.  Unenhanced liver, spleen, pancreas, and adrenal glands are within normal limits.  Gallbladder is grossly unremarkable.  No intrahepatic or extrahepatic ductal dilatation.  Kidneys are unremarkable.  No renal calculi or hydronephrosis.  No evidence of bowel obstruction.  Normal appendix.  No colonic wall thickening or inflammatory changes.  No evidence of abdominal aortic aneurysm.  No abdominopelvic ascites.  Small retroperitoneal lymph nodes pathologic CT size criteria.  Prostate is unremarkable.  No ureteral or bladder calculi.  Mild degenerative changes of the visualized thoracolumbar spine.  IMPRESSION: No renal, ureteral, or bladder calculi.  No hydronephrosis.  Status post gastric bypass.  No evidence of bowel obstruction.  Original Report Authenticated By: Charline Bills, M.D.   Dg Chest 2 View 02/11/2012  *RADIOLOGY REPORT*  Clinical Data: Chest pain, short of breath, cough  CHEST - 2 VIEW  Comparison: None.  Findings: Prominent heart size with vascular and interstitial  prominence.  Component of early edema versus volume overload not excluded.  No focal pneumonia, collapse, consolidation.  No effusion pneumothorax.  Trachea midline.  IMPRESSION: Borderline heart size with vascular and basilar interstitial prominence.  Original Report Authenticated By: Judie Petit. Ruel Favors, M.D.     Assessment and Plan  46 y/o M with hx gastric bypass, OSA, HTN and ETT 01/15/12 with no chest pain but atrial arrhythmias/ventricular ectopy/brief NSVT (plans to repeat stress test when BP more controlled) - presented to [facility] with CP/SOB found to have AF-RVR.  1. CP/SOB - possibly due to #2  but has risk factors. CE's neg so far. Await echo. See below. 2. Newly diagnosed atrial fib with RVR - heparin per pharmacy started overnight by fellow who was called to come see patient due to power outage. Add TSH. Need to push up rate control. Will d/w MD. 3. Recent hematuria - by history of flank pain, passage of painful urine, then subsequent gradual clearing of hematuria, this probably represents recent kidney stone. However, per Dr. Alford Highland note, will order CT abdomen/pelvis with contrast to r/o renal infarct. Note on non-contrast study, no calculi seen, kidneys are unremarkable. I called radiology to clarify the phrase "small retroperitoneal lymph nodes pathologic CT size criteria" -- per Dr. Chancy Milroy re-read, this statement should read "small retroperitoneal lymph nodes which do not meet pathologic CT size criteria." Will ask urology to see. 4. Acute diastolic CHF - await 2D echo. Cont IV Lasix for now. Per handwritten fellow note, was to get 1 dose last night and got another dose this AM. Give of KCl this AM given K of 3.5 / Lasix. 5. HTN - BP better controlled this AM. See below re: med changes. 6. Mild anemia/thrombocytopenia - per chart, has hx of pancytopenia. Will need to follow closely.  Signed, Ronie Spies PA-C  As above, patient seen and examined. Patient feels much better with rate control and gentle diuresis. He presented with new onset atrial fibrillation. The patient was having his echocardiogram at the time of this assessment. His LV function appears to be reduced. We will review this fully. If his LV function is reduced we will plan to proceed with cardiac catheterization to exclude coronary disease. Check TSH. It may be that a cardiomyopathy caused his atrial fibrillation or alternatively his atrial fibrillation with a rapid ventricular response cause reduced LV function. I will increase his metoprolol to 50 mg by mouth twice a day. Continue ARB. Continue Lasix and decrease  spironolactone to 25 mg daily. Follow renal function. Plan to proceed with abdominal CT with contrast to exclude renal infarct as the potential cause for his hematuria. If negative we will ask urology to evaluate. I will continue IV heparin for now. Once we have finished with his cardiac catheterization we will plan to proceed with TEE guided cardioversion and long-term anticoagulation. Olga Millers 10:13 AM

## 2012-02-12 NOTE — Progress Notes (Signed)
  Echocardiogram 2D Echocardiogram has been performed.  Tyrone [name] 02/12/2012, 10:31 AM

## 2012-02-13 ENCOUNTER — Encounter (HOSPITAL_COMMUNITY)
Admission: EM | Disposition: A | Discharge status: Home / Self Care | Payer: Self-pay | Source: Home / Self Care | Attending: Cardiology

## 2012-02-13 DIAGNOSIS — I509 Heart failure, unspecified: Secondary | ICD-10-CM

## 2012-02-13 HISTORY — PX: LEFT AND RIGHT HEART CATHETERIZATION WITH CORONARY ANGIOGRAM: SHX5449

## 2012-02-13 LAB — POCT I-STAT 3, VENOUS BLOOD GAS (G3P V)
Acid-Base Excess: 2 mmol/L (ref 0.0–2.0)
Bicarbonate: 26.6 mEq/L — ABNORMAL HIGH (ref 20.0–24.0)
O2 Saturation: 64 %
TCO2: 28 mmol/L (ref 0–100)
pCO2, Ven: 42.2 mmHg — ABNORMAL LOW (ref 45.0–50.0)
pH, Ven: 7.407 — ABNORMAL HIGH (ref 7.250–7.300)
pO2, Ven: 33 mmHg (ref 30.0–45.0)

## 2012-02-13 LAB — POCT I-STAT 3, ART BLOOD GAS (G3+)
Acid-Base Excess: 1 mmol/L (ref 0.0–2.0)
Bicarbonate: 24.9 mEq/L — ABNORMAL HIGH (ref 20.0–24.0)
O2 Saturation: 93 %
TCO2: 26 mmol/L (ref 0–100)
pCO2 arterial: 36.9 mmHg (ref 35.0–45.0)
pH, Arterial: 7.437 (ref 7.350–7.450)
pO2, Arterial: 65 mmHg — ABNORMAL LOW (ref 80.0–100.0)

## 2012-02-13 LAB — CBC
HCT: 37.4 % — ABNORMAL LOW (ref 39.0–52.0)
Hemoglobin: 12.7 g/dL — ABNORMAL LOW (ref 13.0–17.0)
MCH: 31.8 pg (ref 26.0–34.0)
MCHC: 34 g/dL (ref 30.0–36.0)
MCV: 93.5 fL (ref 78.0–100.0)
Platelets: 148 10*3/uL — ABNORMAL LOW (ref 150–400)
RBC: 4 MIL/uL — ABNORMAL LOW (ref 4.22–5.81)
RDW: 13.9 % (ref 11.5–15.5)
WBC: 5.5 10*3/uL (ref 4.0–10.5)

## 2012-02-13 LAB — LIPID PANEL
Cholesterol: 125 mg/dL (ref 0–200)
HDL: 62 mg/dL (ref >39)
LDL Cholesterol: 55 mg/dL (ref 0–99)
Total CHOL/HDL Ratio: 2 RATIO
Triglycerides: 39 mg/dL (ref <150)
VLDL: 8 mg/dL (ref 0–40)

## 2012-02-13 LAB — HEPARIN LEVEL (UNFRACTIONATED): Heparin Unfractionated: 0.43 IU/mL (ref 0.30–0.70)

## 2012-02-13 LAB — POCT ACTIVATED CLOTTING TIME: Activated Clotting Time: 190 seconds

## 2012-02-13 SURGERY — LEFT AND RIGHT HEART CATHETERIZATION WITH CORONARY ANGIOGRAM
Anesthesia: LOCAL

## 2012-02-13 MED ORDER — NITROGLYCERIN 0.2 MG/ML ON CALL CATH LAB
INTRAVENOUS | Status: AC
  Filled 2012-02-13: qty 1

## 2012-02-13 MED ORDER — LIDOCAINE HCL (PF) 1 % IJ SOLN
INTRAMUSCULAR | Status: AC
  Filled 2012-02-13: qty 30

## 2012-02-13 MED ORDER — HEPARIN (PORCINE) IN NACL 2-0.9 UNIT/ML-% IJ SOLN
INTRAMUSCULAR | Status: AC
  Filled 2012-02-13: qty 2000

## 2012-02-13 MED ORDER — FENTANYL CITRATE 0.05 MG/ML IJ SOLN
INTRAMUSCULAR | Status: AC
  Filled 2012-02-13: qty 2

## 2012-02-13 MED ORDER — ONDANSETRON HCL 4 MG/2ML IJ SOLN: 4.0000 mg | Freq: Four times a day (QID) | INTRAMUSCULAR | Status: DC | PRN

## 2012-02-13 MED ORDER — MIDAZOLAM HCL 2 MG/2ML IJ SOLN
INTRAMUSCULAR | Status: AC
  Filled 2012-02-13: qty 2

## 2012-02-13 MED ORDER — HEPARIN SODIUM (PORCINE) 1000 UNIT/ML IJ SOLN
INTRAMUSCULAR | Status: AC
  Filled 2012-02-13: qty 1

## 2012-02-13 MED ORDER — VERAPAMIL HCL 2.5 MG/ML IV SOLN
INTRAVENOUS | Status: AC
  Filled 2012-02-13: qty 2

## 2012-02-13 MED ORDER — ACETAMINOPHEN 325 MG PO TABS: 650.0000 mg | ORAL_TABLET | ORAL | Status: DC | PRN

## 2012-02-13 MED ORDER — METOPROLOL TARTRATE 50 MG PO TABS
75.0000 mg | ORAL_TABLET | Freq: Two times a day (BID) | ORAL | Status: DC
  Administered 2012-02-13 (×2): 75 mg via ORAL
  Filled 2012-02-13 (×4): qty 1

## 2012-02-13 MED ORDER — DABIGATRAN ETEXILATE MESYLATE 150 MG PO CAPS
150.0000 mg | ORAL_CAPSULE | Freq: Two times a day (BID) | ORAL | Status: DC
Start: 2012-02-13 — End: 2012-02-16
  Administered 2012-02-13 – 2012-02-16 (×6): 150 mg via ORAL
  Filled 2012-02-13 (×7): qty 1

## 2012-02-13 NOTE — CV Procedure (Signed)
   Cardiac Catheterization Procedure Note  Name: Tyrone [name] MRN: [id number] DOB: 08/09/1965  Procedure: Right Heart Cath, Left Heart Cath, Selective Coronary Angiography  Indication: Atrial fibrillation, cardiomyopathy, and CHF.   Procedural Details: The right wrist was prepped, draped, and anesthetized with 1% lidocaine. Using the modified Seldinger technique a 5 French sheath was placed in the right radial artery. 3 mg of her abdomen was administered through the sheath and 5000 units of unfractionated heparin was given. There was an indwelling IV line in an antecubital vein in the right arm. This was changed out for a short 5 [place] sheath using normal sterile technique. A 5 French Swan-Ganz catheter was used for the right heart catheterization. Standard protocol was followed for recording of right heart pressures and sampling of oxygen saturations. Fick cardiac output was calculated. Standard Judkins catheters were used for selective coronary angiography. Left ventriculography was deferred as the patient has undergone an echocardiogram during this admission. There were no immediate procedural complications. The patient was transferred to the post catheterization recovery area for further monitoring.  Procedural Findings: Hemodynamics RA 10 RV 29/12 PA 36/19 with a mean of 25 PCWP 70 LV 130/18 AO 132/86  Oxygen saturations: PA 64 AO 93  Cardiac Output (Fick) 5.8  Cardiac Index (Fick) 2.7   Coronary angiography: Coronary dominance: right  Left mainstem: Widely patent with no obstructive disease  Left anterior descending (LAD): The LAD reaches the left ventricular apex. There is no calcification or obstructive disease seen. There is a large septal perforator branch and a moderate caliber diagonal with no obstructive disease. The first septal perforator runs the course of a twin LAD.  Left circumflex (LCx): The left circumflex is widely patent. There is a large first OM with no  obstructive disease. The second OM is small.  Right coronary artery (RCA): This is a large, dominant vessel. There is minimal plaque at the junction of the mid and distal vessel. There is no obstructive disease present. The PDA and posterolateral branches are patent.  Left ventriculography: Deferred  Final Conclusions:   1. Widely patent coronary arteries with minimal nonobstructive CAD as outlined above 2. Mildly increased right and left heart filling pressures.  Recommendations: Medical therapy for cardiomyopathy. The patient will be started on full anticoagulation tonight with dabigatran for treatment of atrial fibrillation in the setting of CHF.  Tonny Bollman 02/13/2012, 5:34 PM

## 2012-02-13 NOTE — Interval H&P Note (Signed)
History and Physical Interval Note:  02/13/2012 4:45 PM  Tyrone [name]  has presented today for surgery, with the diagnosis of chest pain  The various methods of treatment have been discussed with the patient and family. After consideration of risks, benefits and other options for treatment, the patient has consented to  Procedure(s) (LRB): LEFT AND RIGHT HEART CATHETERIZATION WITH CORONARY ANGIOGRAM (N/A) as a surgical intervention .  The patient's history has been reviewed, patient examined, no change in status, stable for surgery.  I have reviewed the patient's chart and labs.  Questions were answered to the patient's satisfaction.     Tonny Bollman  02/13/2012 4:45 PM

## 2012-02-13 NOTE — H&P (View-Only) (Signed)
Patient: Tyrone [name] Date of Encounter: 02/13/2012, 6:26 AM Admit date: 02/11/2012     Subjective  No CP or SOB   Objective   Telemetry: atrial fib RVR rates 100-120, occ PVCs/couplets Physical Exam: Filed Vitals:   02/13/12 0427  BP:   Pulse:   Temp: 97.7 F (36.5 C)  Resp:    General: Well developed, well nourished, in no acute distress. Head: Normal Neck: Supple Lungs: Clear bilaterally to auscultation without wheezes, rales, or rhonchi. Breathing is unlabored. Heart: Irregular Abdomen: Soft, non-tender, non-distended  Extremities: No edema.   Neuro: Alert and oriented X 3. Moves all extremities spontaneously.    Intake/Output Summary (Last 24 hours) at 02/13/12 0626 Last data filed at 02/13/12 0500  Gross per 24 hour  Intake  589.5 ml  Output   4800 ml  Net -4210.5 ml    Inpatient Medications:     . aspirin  324 mg Oral Pre-Cath  . aspirin  81 mg Oral Daily  . calcium-vitamin D  1 tablet Oral Daily  . furosemide  40 mg Intravenous BID  . losartan  50 mg Oral BID  . metoprolol tartrate  50 mg Oral BID  . multivitamin with minerals  3 tablet Oral Daily  . potassium chloride  40 mEq Oral Once  . spironolactone  25 mg Oral Daily  . vitamin B-12  100 mcg Oral Daily  . vitamin E  400 Units Oral Daily  . DISCONTD: enoxaparin (LOVENOX) injection  40 mg Subcutaneous Q24H  . DISCONTD: metoprolol tartrate  25 mg Oral BID  . DISCONTD: spironolactone  50 mg Oral Daily    Labs:  [facility] 02/12/12 2032 02/12/12 0515  NA 142 142  K 3.8 3.5  CL 104 107  CO2 29 23  GLUCOSE 127* 97  BUN 10 10  CREATININE 0.98 0.81  CALCIUM 8.5 8.1*  MG -- 1.9  PHOS -- --    Basename 02/13/12 0515 02/12/12 2032 02/11/12 1230  WBC 5.5 6.2 --  NEUTROABS -- -- 3.8  HGB 12.7* 13.4 --  HCT 37.4* 39.6 --  MCV 93.5 93.6 --  PLT 148* 145* --    Basename 02/12/12 1822 02/12/12 1234 02/12/12 0515  CKTOTAL 57 57 53  CKMB 1.2 1.2 1.6  TROPONINI <0.30 <0.30 <0.30    Radiology/Studies:  Ct Abdomen Pelvis Wo Contrast 02/11/2012  *RADIOLOGY REPORT*  Clinical Data: Shortness of breath, hematuria, history of gastric bypass  CT ABDOMEN AND PELVIS WITHOUT CONTRAST  Technique:  Multidetector CT imaging of the abdomen and pelvis was performed following the standard protocol without intravenous contrast.  Comparison: None.  Findings: Lung bases are clear.  The heart is top normal in size.  Small hiatal hernia.  Status post gastric bypass.  Unenhanced liver, spleen, pancreas, and adrenal glands are within normal limits.  Gallbladder is grossly unremarkable.  No intrahepatic or extrahepatic ductal dilatation.  Kidneys are unremarkable.  No renal calculi or hydronephrosis.  No evidence of bowel obstruction.  Normal appendix.  No colonic wall thickening or inflammatory changes.  No evidence of abdominal aortic aneurysm.  No abdominopelvic ascites.  Small retroperitoneal lymph nodes pathologic CT size criteria.  Prostate is unremarkable.  No ureteral or bladder calculi.  Mild degenerative changes of the visualized thoracolumbar spine.  IMPRESSION: No renal, ureteral, or bladder calculi.  No hydronephrosis.  Status post gastric bypass.  No evidence of bowel obstruction.  Original Report Authenticated By: Charline Bills, M.D.   Dg Chest 2 View 02/11/2012  *  RADIOLOGY REPORT*  Clinical Data: Chest pain, short of breath, cough  CHEST - 2 VIEW  Comparison: None.  Findings: Prominent heart size with vascular and interstitial prominence.  Component of early edema versus volume overload not excluded.  No focal pneumonia, collapse, consolidation.  No effusion pneumothorax.  Trachea midline.  IMPRESSION: Borderline heart size with vascular and basilar interstitial prominence.  Original Report Authenticated By: Judie Petit. Ruel Favors, M.D.     Assessment and Plan  #1-atrial fibrillation-patient remains in atrial fibrillation. His heart rate remains mildly elevated. Increase metoprolol to 75 mg by  mouth twice a day. If cardiac catheterization revealed no obstructive coronary disease will proceed with transesophageal echocardiogram guided cardioversion.. Begin pradaxa this PM if groin stable and no other coronary interventions required. #2-cardiomyopathy-echocardiogram shows reduced LV function. Continue beta blocker and ARB. Etiology of cardiomyopathy unclear. Cardiac catheterization today to exclude coronary disease. The risks and benefits were discussed and the patient agrees to proceed. Note TSH normal. Patient states he consumes 1/5 of alcohol per week. This certainly could be contributing. I have asked him to abstain from this. Atrial fibrillation with a rapid ventricular response could also be contributing and we plan for TEE guided cardioversion later. Alternatively patient may have developed a cardiomyopathy which caused atrial fibrillation. Plan repeat echocardiogram once he has abstained from alcohol and sinus restored most likely in 3 months. #3-acute systolic congestive heart failure-volume status is improving. Hold Lasix today and resume at lower dose tomorrow morning if renal function stable. #4-hematuria-abdominal CT shows no renal infarct. He will need to followup with urology following discharge. We appreciate their consult from last evening.  Signed, Olga Millers MD 6:38 AM

## 2012-02-13 NOTE — Progress Notes (Signed)
ANTICOAGULATION CONSULT NOTE - follow up  Pharmacy Consult for heparin Indication: atrial fibrillation  No Known Allergies  Patient Measurements: Height: 5\' 6"  (167.6 cm) Weight: 239 lb 13.8 oz (108.8 kg) IBW/kg (Calculated) : 63.8  Heparin Dosing Weight: 93kg  Labs:  Basename 02/13/12 0515 02/12/12 2032 02/12/12 1822 02/12/12 1234 02/12/12 1157 02/12/12 0515 02/11/12 1230  HGB 12.7* 13.4 -- -- -- -- --  HCT 37.4* 39.6 -- -- -- 36.6* --  PLT 148* 145* -- -- -- 137* --  APTT -- -- -- -- -- -- 26  LABPROT -- -- -- -- -- -- 16.3*  INR -- -- -- -- -- -- 1.29  HEPARINUNFRC 0.43 0.34 -- -- <0.10* -- --  CREATININE -- 0.98 -- -- -- 0.81 0.96  CKTOTAL -- -- 57 57 -- 53 --  CKMB -- -- 1.2 1.2 -- 1.6 --  TROPONINI -- -- <0.30 <0.30 -- <0.30 --    Estimated Creatinine Clearance: 107.8 ml/min (by C-G formula based on Cr of 0.98).    Assessment: 46yo male with new onset Afib on heparin infusion. Heparin level 0.43 on 1650 units/hr.  He had hematuria on admission without specific cause determined, H/H wnl. PLTC 148, was 164 on admit.  Abd CT neg for renal infarct. GI consulted.  Plan for cardiac cath + TEE guided cardioversion today if no obstructive CAD.  To start pradaxa this PM if groin stable.  Goal of Therapy:  Heparin level 0.3-0.7 units/ml Monitor platelets by anticoagulation protocol: Yes   Plan:  - continue heparin gtt [id number] units/hr  -f/u after cath for plans Herby Abraham, Pharm.D. [phone] 02/13/2012 9:26 AM

## 2012-02-13 NOTE — Progress Notes (Signed)
 Patient: Tyrone [name] Date of Encounter: 02/13/2012, 6:26 AM Admit date: 02/11/2012     Subjective  No CP or SOB   Objective   Telemetry: atrial fib RVR rates 100-120, occ PVCs/couplets Physical Exam: Filed Vitals:   02/13/12 0427  BP:   Pulse:   Temp: 97.7 F (36.5 C)  Resp:    General: Well developed, well nourished, in no acute distress. Head: Normal Neck: Supple Lungs: Clear bilaterally to auscultation without wheezes, rales, or rhonchi. Breathing is unlabored. Heart: Irregular Abdomen: Soft, non-tender, non-distended  Extremities: No edema.   Neuro: Alert and oriented X 3. Moves all extremities spontaneously.    Intake/Output Summary (Last 24 hours) at 02/13/12 0626 Last data filed at 02/13/12 0500  Gross per 24 hour  Intake  589.5 ml  Output   4800 ml  Net -4210.5 ml    Inpatient Medications:     . aspirin  324 mg Oral Pre-Cath  . aspirin  81 mg Oral Daily  . calcium-vitamin D  1 tablet Oral Daily  . furosemide  40 mg Intravenous BID  . losartan  50 mg Oral BID  . metoprolol tartrate  50 mg Oral BID  . multivitamin with minerals  3 tablet Oral Daily  . potassium chloride  40 mEq Oral Once  . spironolactone  25 mg Oral Daily  . vitamin B-12  100 mcg Oral Daily  . vitamin E  400 Units Oral Daily  . DISCONTD: enoxaparin (LOVENOX) injection  40 mg Subcutaneous Q24H  . DISCONTD: metoprolol tartrate  25 mg Oral BID  . DISCONTD: spironolactone  50 mg Oral Daily    Labs:  Basename 02/12/12 2032 02/12/12 0515  NA 142 142  K 3.8 3.5  CL 104 107  CO2 29 23  GLUCOSE 127* 97  BUN 10 10  CREATININE 0.98 0.81  CALCIUM 8.5 8.1*  MG -- 1.9  PHOS -- --    Basename 02/13/12 0515 02/12/12 2032 02/11/12 1230  WBC 5.5 6.2 --  NEUTROABS -- -- 3.8  HGB 12.7* 13.4 --  HCT 37.4* 39.6 --  MCV 93.5 93.6 --  PLT 148* 145* --    Basename 02/12/12 1822 02/12/12 1234 02/12/12 0515  CKTOTAL 57 57 53  CKMB 1.2 1.2 1.6  TROPONINI <0.30 <0.30 <0.30    Radiology/Studies:  Ct Abdomen Pelvis Wo Contrast 02/11/2012  *RADIOLOGY REPORT*  Clinical Data: Shortness of breath, hematuria, history of gastric bypass  CT ABDOMEN AND PELVIS WITHOUT CONTRAST  Technique:  Multidetector CT imaging of the abdomen and pelvis was performed following the standard protocol without intravenous contrast.  Comparison: None.  Findings: Lung bases are clear.  The heart is top normal in size.  Small hiatal hernia.  Status post gastric bypass.  Unenhanced liver, spleen, pancreas, and adrenal glands are within normal limits.  Gallbladder is grossly unremarkable.  No intrahepatic or extrahepatic ductal dilatation.  Kidneys are unremarkable.  No renal calculi or hydronephrosis.  No evidence of bowel obstruction.  Normal appendix.  No colonic wall thickening or inflammatory changes.  No evidence of abdominal aortic aneurysm.  No abdominopelvic ascites.  Small retroperitoneal lymph nodes pathologic CT size criteria.  Prostate is unremarkable.  No ureteral or bladder calculi.  Mild degenerative changes of the visualized thoracolumbar spine.  IMPRESSION: No renal, ureteral, or bladder calculi.  No hydronephrosis.  Status post gastric bypass.  No evidence of bowel obstruction.  Original Report Authenticated By: SRIYESH KRISHNAN, M.D.   Dg Chest 2 View 02/11/2012  *  RADIOLOGY REPORT*  Clinical Data: Chest pain, short of breath, cough  CHEST - 2 VIEW  Comparison: None.  Findings: Prominent heart size with vascular and interstitial prominence.  Component of early edema versus volume overload not excluded.  No focal pneumonia, collapse, consolidation.  No effusion pneumothorax.  Trachea midline.  IMPRESSION: Borderline heart size with vascular and basilar interstitial prominence.  Original Report Authenticated By: M. TREVOR SHICK, M.D.     Assessment and Plan  #1-atrial fibrillation-patient remains in atrial fibrillation. His heart rate remains mildly elevated. Increase metoprolol to 75 mg by  mouth twice a day. If cardiac catheterization revealed no obstructive coronary disease will proceed with transesophageal echocardiogram guided cardioversion.. Begin pradaxa this PM if groin stable and no other coronary interventions required. #2-cardiomyopathy-echocardiogram shows reduced LV function. Continue beta blocker and ARB. Etiology of cardiomyopathy unclear. Cardiac catheterization today to exclude coronary disease. The risks and benefits were discussed and the patient agrees to proceed. Note TSH normal. Patient states he consumes 1/5 of alcohol per week. This certainly could be contributing. I have asked him to abstain from this. Atrial fibrillation with a rapid ventricular response could also be contributing and we plan for TEE guided cardioversion later. Alternatively patient may have developed a cardiomyopathy which caused atrial fibrillation. Plan repeat echocardiogram once he has abstained from alcohol and sinus restored most likely in 3 months. #3-acute systolic congestive heart failure-volume status is improving. Hold Lasix today and resume at lower dose tomorrow morning if renal function stable. #4-hematuria-abdominal CT shows no renal infarct. He will need to followup with urology following discharge. We appreciate their consult from last evening.  Signed, Brian Crenshaw MD 6:38 AM    

## 2012-02-14 DIAGNOSIS — I428 Other cardiomyopathies: Secondary | ICD-10-CM | POA: Diagnosis present

## 2012-02-14 DIAGNOSIS — I5021 Acute systolic (congestive) heart failure: Secondary | ICD-10-CM | POA: Diagnosis present

## 2012-02-14 LAB — BASIC METABOLIC PANEL
BUN: 12 mg/dL (ref 6–23)
CO2: 25 mEq/L (ref 19–32)
Calcium: 8.1 mg/dL — ABNORMAL LOW (ref 8.4–10.5)
Chloride: 107 mEq/L (ref 96–112)
Creatinine, Ser: 0.8 mg/dL (ref 0.50–1.35)
GFR calc Af Amer: >90 mL/min (ref >90)
GFR calc non Af Amer: >90 mL/min (ref >90)
Glucose, Bld: 97 mg/dL (ref 70–99)
Potassium: 3.6 mEq/L (ref 3.5–5.1)
Sodium: 144 mEq/L (ref 135–145)

## 2012-02-14 LAB — CBC
HCT: 37.6 % — ABNORMAL LOW (ref 39.0–52.0)
Hemoglobin: 12.7 g/dL — ABNORMAL LOW (ref 13.0–17.0)
MCH: 31.4 pg (ref 26.0–34.0)
MCHC: 33.8 g/dL (ref 30.0–36.0)
MCV: 93.1 fL (ref 78.0–100.0)
Platelets: 151 10*3/uL (ref 150–400)
RBC: 4.04 MIL/uL — ABNORMAL LOW (ref 4.22–5.81)
RDW: 13.7 % (ref 11.5–15.5)
WBC: 4.6 10*3/uL (ref 4.0–10.5)

## 2012-02-14 LAB — HEPARIN LEVEL (UNFRACTIONATED): Heparin Unfractionated: <0.1 IU/mL — ABNORMAL LOW (ref 0.30–0.70)

## 2012-02-14 MED ORDER — METOPROLOL TARTRATE 100 MG PO TABS
100.0000 mg | ORAL_TABLET | Freq: Two times a day (BID) | ORAL | Status: DC
  Administered 2012-02-14 – 2012-02-16 (×5): 100 mg via ORAL
  Filled 2012-02-14 (×6): qty 1

## 2012-02-14 NOTE — Progress Notes (Signed)
Patient ID: Tyrone [name], male   DOB: 04-08-1966, 46 y.o.   MRN: [id number]   Patient Name: Tyrone [name] Date of Encounter: 02/14/2012    SUBJECTIVE Feeling well this morning. No chest pain shortness of breath or  palpitations. Would like to get the cardioversion done today.   CURRENT MEDS    . aspirin  81 mg Oral Daily  . calcium-vitamin D  1 tablet Oral Daily  . dabigatran  150 mg Oral Q12H  . fentaNYL      . heparin      . heparin      . heparin      . lidocaine      . losartan  50 mg Oral BID  . metoprolol tartrate  75 mg Oral BID  . midazolam      . multivitamin with minerals  3 tablet Oral Daily  . nitroGLYCERIN      . spironolactone  25 mg Oral Daily  . verapamil      . vitamin B-12  100 mcg Oral Daily  . vitamin E  400 Units Oral Daily    OBJECTIVE  Filed Vitals:   02/14/12 0000 02/14/12 0400 02/14/12 0500 02/14/12 0730  BP: 119/83 120/80  129/92  Pulse:    82  Temp: 97.5 F (36.4 C) 97.7 F (36.5 C)  97.4 F (36.3 C)  TempSrc: Oral Oral  Oral  Resp: 18   18  Height:      Weight:   243 lb 2.7 oz (110.3 kg)   SpO2: 96% 98%  97%    Intake/Output Summary (Last 24 hours) at 02/14/12 0754 Last data filed at 02/14/12 0600  Gross per 24 hour  Intake  728.5 ml  Output    400 ml  Net  328.5 ml   Filed Weights   02/12/12 0300 02/13/12 0500 02/14/12 0500  Weight: 248 lb 10.9 oz (112.8 kg) 239 lb 13.8 oz (108.8 kg) 243 lb 2.7 oz (110.3 kg)    PHYSICAL EXAM  General: Pleasant, NAD. Neuro: Alert and oriented X 3. Moves all extremities spontaneously. Psych: Normal affect. HEENT:  Normal  Neck: Supple without bruits or JVD. Lungs:  Resp regular and unlabored, CTA. Heart: Irregular rate and rhythm, no s3, s4, or murmurs. Abdomen: Soft, non-tender, non-distended, BS + x 4.  Extremities: No clubbing, cyanosis or edema. DP/PT/Radials 2+ and equal bilaterally.  Accessory Clinical Findings  CBC  Basename 02/14/12 0520 02/13/12 0515 02/11/12 1230  WBC 4.6  5.5 --  NEUTROABS -- -- 3.8  HGB 12.7* 12.7* --  HCT 37.6* 37.4* --  MCV 93.1 93.5 --  PLT 151 148* --   Basic Metabolic Panel  Basename 02/14/12 0520 02/12/12 2032 02/12/12 0515  NA 144 142 --  K 3.6 3.8 --  CL 107 104 --  CO2 25 29 --  GLUCOSE 97 127* --  BUN 12 10 --  CREATININE 0.80 0.98 --  CALCIUM 8.1* 8.5 --  MG -- -- 1.9  PHOS -- -- --   Liver Function Tests  [facility] 02/12/12 2032 02/12/12 1157  AST 13 15  ALT 22 25  ALKPHOS 95 101  BILITOT 0.5 0.7  PROT 6.5 6.8  ALBUMIN 3.6 3.7   No results found for this basename: LIPASE:2,AMYLASE:2 in the last 72 hours Cardiac Enzymes  Basename 02/12/12 1822 02/12/12 1234 02/12/12 0515  CKTOTAL 57 57 53  CKMB 1.2 1.2 1.6  CKMBINDEX -- -- --  TROPONINI <0.30 <0.30 <0.30  BNP No components found with this basename: POCBNP:3 D-Dimer  Basename 02/12/12 1446  DDIMER 0.35   Hemoglobin A1C No results found for this basename: HGBA1C in the last 72 hours Fasting Lipid Panel  Basename 02/13/12 0515  CHOL 125  HDL 62  LDLCALC 55  TRIG 39  CHOLHDL 2.0  LDLDIRECT --   Thyroid Function Tests  Basename 02/12/12 1157  TSH 2.877  T4TOTAL --  T3FREE --  THYROIDAB --    TELE  A. fib with a rate of 110  ECG    Radiology/Studies  Ct Abdomen Pelvis Wo Contrast  02/12/2012  **ADDENDUM** CREATED: 02/12/2012 09:54:02  Dictation error in the original report.  A sentence in the findings section should read:  Small retroperitoneal lymph nodes which do not meet pathologic CT size criteria.  The remainder of the dictation, including the impression, is unchanged.  **END ADDENDUM** SIGNED BY: Charline Bills, M.D.   02/11/2012  *RADIOLOGY REPORT*  Clinical Data: Shortness of breath, hematuria, history of gastric bypass  CT ABDOMEN AND PELVIS WITHOUT CONTRAST  Technique:  Multidetector CT imaging of the abdomen and pelvis was performed following the standard protocol without intravenous contrast.  Comparison: None.   Findings: Lung bases are clear.  The heart is top normal in size.  Small hiatal hernia.  Status post gastric bypass.  Unenhanced liver, spleen, pancreas, and adrenal glands are within normal limits.  Gallbladder is grossly unremarkable.  No intrahepatic or extrahepatic ductal dilatation.  Kidneys are unremarkable.  No renal calculi or hydronephrosis.  No evidence of bowel obstruction.  Normal appendix.  No colonic Tarrin Menn thickening or inflammatory changes.  No evidence of abdominal aortic aneurysm.  No abdominopelvic ascites.  Small retroperitoneal lymph nodes pathologic CT size criteria.  Prostate is unremarkable.  No ureteral or bladder calculi.  Mild degenerative changes of the visualized thoracolumbar spine.  IMPRESSION: No renal, ureteral, or bladder calculi.  No hydronephrosis.  Status post gastric bypass.  No evidence of bowel obstruction.  Original Report Authenticated By: Charline Bills, M.D.   Dg Chest 2 View  02/11/2012  *RADIOLOGY REPORT*  Clinical Data: Chest pain, short of breath, cough  CHEST - 2 VIEW  Comparison: None.  Findings: Prominent heart size with vascular and interstitial prominence.  Component of early edema versus volume overload not excluded.  No focal pneumonia, collapse, consolidation.  No effusion pneumothorax.  Trachea midline.  IMPRESSION: Borderline heart size with vascular and basilar interstitial prominence.  Original Report Authenticated By: Judie Petit. Ruel Favors, M.D.   Ct Abdomen Pelvis W Contrast  02/12/2012  *RADIOLOGY REPORT*  Clinical Data: Hematuria.  Atrial fibrillation.  Evaluate for renal infarct.  CT ABDOMEN AND PELVIS WITH CONTRAST  Technique:  Multidetector CT imaging of the abdomen and pelvis was performed following the standard protocol during bolus administration of intravenous contrast.  Contrast: OMNIPAQUE IOHEXOL 300 MG/ML  SOLN  Comparison: Noncontrast CT on 02/11/2012  Findings: No evidence of renal infarct or other parenchymal lesion. No evidence of  hydronephrosis.  Delayed excretory phase images show no abnormality involving the intrarenal collecting systems.  Previous gastric bypass surgery again noted.  The liver, gallbladder, spleen, pancreas, and adrenal glands are normal in appearance.  No soft tissue masses or lymphadenopathy identified within the abdomen or pelvis.  No evidence of inflammatory process or abnormal fluid collections. No evidence of bowel Josue Falconi thickening or dilatation.  IMPRESSION:  1.  No evidence of renal infarct or other significant abnormality. 2.  Previous gastric bypass surgery again  noted.  Original Report Authenticated By: Danae Orleans, M.D.    ASSESSMENT AND PLAN  Principal Problem:  *Atrial fibrillation with RVR Active Problems:  Hypertension  Chest pain  Hematuria    He has a nonischemic cardiomyopathy either from atrial fib and/or alcohol. We increased his metoprolol 100 twice a day today for better rate control. He is on anticoagulation and received a dose last night. He is given a dose at 10 AM. Will hold n.p.o. for TE cardioversion today. Continue with ACE inhibitor and spironolactone. Repeat outpatient echo if remains in sinus rhythm in 3 months per Dr. Jens Som.  Signed, Valera Castle MD

## 2012-02-14 NOTE — Progress Notes (Signed)
Patient ID: Tyrone [name], male   DOB: 07-Jan-1966, 46 y.o.   MRN: [id number] He has not received enough doses of dibigatrn for cardioversion today. Reschedule for tomorrow afternoon. He did receive intravenous heparin on admission which appears to have been discontinued because of hematuria.  If TE cardioversion successful lumbar he can go home tomorrow afternoon. We'll increase metoprolol today to control ventricular rate.

## 2012-02-15 LAB — CBC
HCT: 37.4 % — ABNORMAL LOW (ref 39.0–52.0)
Hemoglobin: 12.7 g/dL — ABNORMAL LOW (ref 13.0–17.0)
MCH: 31.7 pg (ref 26.0–34.0)
MCHC: 34 g/dL (ref 30.0–36.0)
MCV: 93.3 fL (ref 78.0–100.0)
Platelets: 141 10*3/uL — ABNORMAL LOW (ref 150–400)
RBC: 4.01 MIL/uL — ABNORMAL LOW (ref 4.22–5.81)
RDW: 13.6 % (ref 11.5–15.5)
WBC: 5.6 10*3/uL (ref 4.0–10.5)

## 2012-02-15 MED ORDER — FUROSEMIDE 40 MG PO TABS
40.0000 mg | ORAL_TABLET | Freq: Every day | ORAL | Status: DC
  Administered 2012-02-15 – 2012-02-16 (×2): 40 mg via ORAL
  Filled 2012-02-15 (×2): qty 1

## 2012-02-15 NOTE — Progress Notes (Signed)
Patient: Tyrone [name] Date of Encounter: 02/15/2012, 7:20 AM Admit date: 02/11/2012     Subjective  No CP; mild dyspnea   Objective   Telemetry: atrial fib Physical Exam: Filed Vitals:   02/15/12 0322  BP: 117/78  Pulse: 114  Temp: 97.8 F (36.6 C)  Resp: 16   General: Well developed, well nourished, in no acute distress. Head: Normal Neck: Supple Lungs: Clear bilaterally to auscultation without wheezes, rales, or rhonchi. Breathing is unlabored. Heart: Irregular Abdomen: Soft, non-tender, non-distended  Extremities: No edema.   Neuro: Alert and oriented X 3. Moves all extremities spontaneously.    Intake/Output Summary (Last 24 hours) at 02/15/12 0720 Last data filed at 02/14/12 2326  Gross per 24 hour  Intake   1200 ml  Output    650 ml  Net    550 ml    Inpatient Medications:     . aspirin  81 mg Oral Daily  . calcium-vitamin D  1 tablet Oral Daily  . dabigatran  150 mg Oral Q12H  . losartan  50 mg Oral BID  . metoprolol tartrate  100 mg Oral BID  . multivitamin with minerals  3 tablet Oral Daily  . spironolactone  25 mg Oral Daily  . vitamin B-12  100 mcg Oral Daily  . vitamin E  400 Units Oral Daily  . DISCONTD: metoprolol tartrate  75 mg Oral BID    Labs:  [facility] 02/14/12 0520 02/12/12 2032  NA 144 142  K 3.6 3.8  CL 107 104  CO2 25 29  GLUCOSE 97 127*  BUN 12 10  CREATININE 0.80 0.98  CALCIUM 8.1* 8.5  MG -- --  PHOS -- --    Basename 02/15/12 0500 02/14/12 0520  WBC 5.6 4.6  NEUTROABS -- --  HGB 12.7* 12.7*  HCT 37.4* 37.6*  MCV 93.3 93.1  PLT 141* 151    Basename 02/12/12 1822 02/12/12 1234  CKTOTAL 57 57  CKMB 1.2 1.2  TROPONINI <0.30 <0.30   Radiology/Studies:  Ct Abdomen Pelvis Wo Contrast 02/11/2012  *RADIOLOGY REPORT*  Clinical Data: Shortness of breath, hematuria, history of gastric bypass  CT ABDOMEN AND PELVIS WITHOUT CONTRAST  Technique:  Multidetector CT imaging of the abdomen and pelvis was performed following the  standard protocol without intravenous contrast.  Comparison: None.  Findings: Lung bases are clear.  The heart is top normal in size.  Small hiatal hernia.  Status post gastric bypass.  Unenhanced liver, spleen, pancreas, and adrenal glands are within normal limits.  Gallbladder is grossly unremarkable.  No intrahepatic or extrahepatic ductal dilatation.  Kidneys are unremarkable.  No renal calculi or hydronephrosis.  No evidence of bowel obstruction.  Normal appendix.  No colonic wall thickening or inflammatory changes.  No evidence of abdominal aortic aneurysm.  No abdominopelvic ascites.  Small retroperitoneal lymph nodes pathologic CT size criteria.  Prostate is unremarkable.  No ureteral or bladder calculi.  Mild degenerative changes of the visualized thoracolumbar spine.  IMPRESSION: No renal, ureteral, or bladder calculi.  No hydronephrosis.  Status post gastric bypass.  No evidence of bowel obstruction.  Original Report Authenticated By: Charline Bills, M.D.   Dg Chest 2 View 02/11/2012  *RADIOLOGY REPORT*  Clinical Data: Chest pain, short of breath, cough  CHEST - 2 VIEW  Comparison: None.  Findings: Prominent heart size with vascular and interstitial prominence.  Component of early edema versus volume overload not excluded.  No focal pneumonia, collapse, consolidation.  No effusion pneumothorax.  Trachea midline.  IMPRESSION: Borderline heart size with vascular and basilar interstitial prominence.  Original Report Authenticated By: Judie Petit. Ruel Favors, M.D.     Assessment and Plan  #1-atrial fibrillation-patient remains in atrial fibrillation. Continue metoprolol. Cardiac catheterization revealed no obstructive coronary disease; will proceed with transesophageal echocardiogram guided cardioversion in AM (patient's 5th dose of pradaxa will be this PM). #2-cardiomyopathy-echocardiogram shows reduced LV function. Continue beta blocker and ARB. Etiology of cardiomyopathy unclear. Cardiac catheterization  shows no coronary disease. Note TSH normal. Patient states he consumes 1/5 of alcohol per week. This certainly could be contributing. I have asked him to abstain from this. Atrial fibrillation with a rapid ventricular response could also be contributing and we plan for TEE guided cardioversion later. Alternatively patient may have developed a cardiomyopathy which caused atrial fibrillation. Plan repeat echocardiogram once he has abstained from alcohol and sinus restored most likely in 3 months. #3-acute systolic congestive heart failure-patient with increased dyspnea. Resume lasix today and follow renal function. #4-hematuria-abdominal CT shows no renal infarct. He will need to followup with urology following discharge. We appreciate their consult.  Signed, Olga Millers MD 7:20 AM

## 2012-02-16 ENCOUNTER — Encounter (HOSPITAL_COMMUNITY): Payer: Self-pay | Admitting: Anesthesiology

## 2012-02-16 ENCOUNTER — Encounter (HOSPITAL_COMMUNITY)
Admission: EM | Disposition: A | Discharge status: Home / Self Care | Payer: Self-pay | Source: Home / Self Care | Attending: Cardiology

## 2012-02-16 ENCOUNTER — Encounter (HOSPITAL_COMMUNITY): Payer: Self-pay | Admitting: Cardiology

## 2012-02-16 ENCOUNTER — Inpatient Hospital Stay (HOSPITAL_COMMUNITY): Payer: BC Managed Care – PPO | Admitting: Anesthesiology

## 2012-02-16 ENCOUNTER — Encounter (HOSPITAL_COMMUNITY): Payer: Self-pay | Admitting: *Deleted

## 2012-02-16 DIAGNOSIS — I4891 Unspecified atrial fibrillation: Principal | ICD-10-CM | POA: Insufficient documentation

## 2012-02-16 DIAGNOSIS — I5021 Acute systolic (congestive) heart failure: Secondary | ICD-10-CM

## 2012-02-16 DIAGNOSIS — I428 Other cardiomyopathies: Secondary | ICD-10-CM | POA: Insufficient documentation

## 2012-02-16 DIAGNOSIS — F1011 Alcohol abuse, in remission: Secondary | ICD-10-CM

## 2012-02-16 HISTORY — PX: TEE WITHOUT CARDIOVERSION: SHX5443

## 2012-02-16 HISTORY — PX: CARDIOVERSION: SHX1299

## 2012-02-16 LAB — BASIC METABOLIC PANEL
BUN: 15 mg/dL (ref 6–23)
CO2: 26 mEq/L (ref 19–32)
Calcium: 8.1 mg/dL — ABNORMAL LOW (ref 8.4–10.5)
Chloride: 106 mEq/L (ref 96–112)
Creatinine, Ser: 0.91 mg/dL (ref 0.50–1.35)
GFR calc Af Amer: >90 mL/min (ref >90)
GFR calc non Af Amer: >90 mL/min (ref >90)
Glucose, Bld: 100 mg/dL — ABNORMAL HIGH (ref 70–99)
Potassium: 3.6 mEq/L (ref 3.5–5.1)
Sodium: 140 mEq/L (ref 135–145)

## 2012-02-16 LAB — CBC
HCT: 36.8 % — ABNORMAL LOW (ref 39.0–52.0)
Hemoglobin: 12.6 g/dL — ABNORMAL LOW (ref 13.0–17.0)
MCH: 31.8 pg (ref 26.0–34.0)
MCHC: 34.2 g/dL (ref 30.0–36.0)
MCV: 92.9 fL (ref 78.0–100.0)
Platelets: 133 10*3/uL — ABNORMAL LOW (ref 150–400)
RBC: 3.96 MIL/uL — ABNORMAL LOW (ref 4.22–5.81)
RDW: 13.6 % (ref 11.5–15.5)
WBC: 4.8 10*3/uL (ref 4.0–10.5)

## 2012-02-16 SURGERY — ECHOCARDIOGRAM, TRANSESOPHAGEAL
Anesthesia: Moderate Sedation

## 2012-02-16 MED ORDER — MIDAZOLAM HCL 10 MG/2ML IJ SOLN
INTRAMUSCULAR | Status: DC | PRN
  Administered 2012-02-16 (×2): 2 mg via INTRAVENOUS

## 2012-02-16 MED ORDER — FENTANYL CITRATE 0.05 MG/ML IJ SOLN
INTRAMUSCULAR | Status: AC
  Filled 2012-02-16: qty 2

## 2012-02-16 MED ORDER — MIDAZOLAM HCL 10 MG/2ML IJ SOLN: 10.0000 mg | Freq: Once | INTRAMUSCULAR | Status: DC

## 2012-02-16 MED ORDER — PROPOFOL 10 MG/ML IV EMUL
INTRAVENOUS | Status: DC | PRN
  Administered 2012-02-16: 70 mg via INTRAVENOUS

## 2012-02-16 MED ORDER — SODIUM CHLORIDE 0.9 % IJ SOLN: 3.0000 mL | Freq: Two times a day (BID) | INTRAMUSCULAR | Status: DC

## 2012-02-16 MED ORDER — BENZOCAINE 20 % MT SOLN: 1.0000 "application " | OROMUCOSAL | Status: DC | PRN

## 2012-02-16 MED ORDER — SPIRONOLACTONE 50 MG PO TABS: 25.0000 mg | ORAL_TABLET | Freq: Every day | ORAL | Status: DC

## 2012-02-16 MED ORDER — METOPROLOL TARTRATE 100 MG PO TABS: 100.0000 mg | ORAL_TABLET | Freq: Two times a day (BID) | ORAL | Status: DC

## 2012-02-16 MED ORDER — FENTANYL CITRATE 0.05 MG/ML IJ SOLN
INTRAMUSCULAR | Status: DC | PRN
  Administered 2012-02-16 (×2): 25 ug via INTRAVENOUS

## 2012-02-16 MED ORDER — MIDAZOLAM HCL 10 MG/2ML IJ SOLN
INTRAMUSCULAR | Status: AC
  Filled 2012-02-16: qty 2

## 2012-02-16 MED ORDER — SODIUM CHLORIDE 0.45 % IV SOLN: INTRAVENOUS | Status: DC

## 2012-02-16 MED ORDER — FUROSEMIDE 40 MG PO TABS: 40.0000 mg | ORAL_TABLET | Freq: Every day | ORAL | Status: DC

## 2012-02-16 MED ORDER — SODIUM CHLORIDE 0.9 % IV SOLN
250.0000 mL | INTRAVENOUS | Status: DC | PRN
  Administered 2012-02-16: 500 mL via INTRAVENOUS

## 2012-02-16 MED ORDER — DABIGATRAN ETEXILATE MESYLATE 150 MG PO CAPS: 150.0000 mg | ORAL_CAPSULE | Freq: Two times a day (BID) | ORAL | Status: DC

## 2012-02-16 MED ORDER — FENTANYL CITRATE 0.05 MG/ML IJ SOLN: 250.0000 ug | Freq: Once | INTRAMUSCULAR | Status: DC

## 2012-02-16 MED ORDER — BUTAMBEN-TETRACAINE-BENZOCAINE 2-2-14 % EX AERO
INHALATION_SPRAY | CUTANEOUS | Status: DC | PRN
  Administered 2012-02-16: 2 via TOPICAL

## 2012-02-16 MED ORDER — SODIUM CHLORIDE 0.9 % IJ SOLN: 3.0000 mL | INTRAMUSCULAR | Status: DC | PRN

## 2012-02-16 NOTE — Transfer of Care (Signed)
Immediate Anesthesia Transfer of Care Note  Patient: Tyrone [name]  Procedure(s) Performed: Procedure(s) (LRB): TRANSESOPHAGEAL ECHOCARDIOGRAM (TEE) (N/A) CARDIOVERSION (N/A)  Patient Location: PACU  Anesthesia Type: General  Level of Consciousness: awake, alert , oriented and sedated  Airway & Oxygen Therapy: Patient Spontanous Breathing and Patient connected to nasal cannula oxygen  Post-op Assessment: Report given to PACU RN, Post -op Vital signs reviewed and stable, Patient moving all extremities and Patient moving all extremities X 4  Post vital signs: Reviewed and stable  Complications: No apparent anesthesia complications

## 2012-02-16 NOTE — Progress Notes (Signed)
 Patient: Tyrone [name] Date of Encounter: 02/16/2012, 7:19 AM Admit date: 02/11/2012     Subjective  No CP; dyspnea resolved    Objective   Telemetry: atrial fib Physical Exam: Filed Vitals:   02/16/12 0400  BP: 116/68  Pulse:   Temp: 97.9 F (36.6 C)  Resp: 18   General: Well developed, well nourished, in no acute distress. Head: Normal Neck: Supple Lungs: Clear bilaterally to auscultation without wheezes, rales, or rhonchi. Breathing is unlabored. Heart: Irregular Abdomen: Soft, non-tender, non-distended  Extremities: No edema.   Neuro: Alert and oriented X 3. Moves all extremities spontaneously.    Intake/Output Summary (Last 24 hours) at 02/16/12 0719 Last data filed at 02/16/12 0300  Gross per 24 hour  Intake   1320 ml  Output   2450 ml  Net  -1130 ml    Inpatient Medications:     . calcium-vitamin D  1 tablet Oral Daily  . dabigatran  150 mg Oral Q12H  . furosemide  40 mg Oral Daily  . losartan  50 mg Oral BID  . metoprolol tartrate  100 mg Oral BID  . multivitamin with minerals  3 tablet Oral Daily  . spironolactone  25 mg Oral Daily  . vitamin B-12  100 mcg Oral Daily  . vitamin E  400 Units Oral Daily  . DISCONTD: aspirin  81 mg Oral Daily    Labs:  Basename 02/16/12 0540 02/14/12 0520  NA 140 144  K 3.6 3.6  CL 106 107  CO2 26 25  GLUCOSE 100* 97  BUN 15 12  CREATININE 0.91 0.80  CALCIUM 8.1* 8.1*  MG -- --  PHOS -- --    Basename 02/16/12 0540 02/15/12 0500  WBC 4.8 5.6  NEUTROABS -- --  HGB 12.6* 12.7*  HCT 36.8* 37.4*  MCV 92.9 93.3  PLT 133* 141*   No results found for this basename: CKTOTAL:4,CKMB:4,TROPONINI:4 in the last 72 hours Radiology/Studies:  Ct Abdomen Pelvis Wo Contrast 02/11/2012  *RADIOLOGY REPORT*  Clinical Data: Shortness of breath, hematuria, history of gastric bypass  CT ABDOMEN AND PELVIS WITHOUT CONTRAST  Technique:  Multidetector CT imaging of the abdomen and pelvis was performed following the standard  protocol without intravenous contrast.  Comparison: None.  Findings: Lung bases are clear.  The heart is top normal in size.  Small hiatal hernia.  Status post gastric bypass.  Unenhanced liver, spleen, pancreas, and adrenal glands are within normal limits.  Gallbladder is grossly unremarkable.  No intrahepatic or extrahepatic ductal dilatation.  Kidneys are unremarkable.  No renal calculi or hydronephrosis.  No evidence of bowel obstruction.  Normal appendix.  No colonic wall thickening or inflammatory changes.  No evidence of abdominal aortic aneurysm.  No abdominopelvic ascites.  Small retroperitoneal lymph nodes pathologic CT size criteria.  Prostate is unremarkable.  No ureteral or bladder calculi.  Mild degenerative changes of the visualized thoracolumbar spine.  IMPRESSION: No renal, ureteral, or bladder calculi.  No hydronephrosis.  Status post gastric bypass.  No evidence of bowel obstruction.  Original Report Authenticated By: SRIYESH KRISHNAN, M.D.   Dg Chest 2 View 02/11/2012  *RADIOLOGY REPORT*  Clinical Data: Chest pain, short of breath, cough  CHEST - 2 VIEW  Comparison: None.  Findings: Prominent heart size with vascular and interstitial prominence.  Component of early edema versus volume overload not excluded.  No focal pneumonia, collapse, consolidation.  No effusion pneumothorax.  Trachea midline.  IMPRESSION: Borderline heart size with vascular and   basilar interstitial prominence.  Original Report Authenticated By: M. TREVOR SHICK, M.D.     Assessment and Plan  #1-atrial fibrillation-patient remains in atrial fibrillation. Continue metoprolol. Cardiac catheterization revealed no obstructive coronary disease; will proceed with transesophageal echocardiogram guided cardioversion today; continue pradaxa. #2-cardiomyopathy-echocardiogram shows reduced LV function. Continue beta blocker and ARB. Etiology of cardiomyopathy unclear. Cardiac catheterization shows no coronary disease. Note TSH  normal. Patient states he consumes 1/5 of alcohol per week. This certainly could be contributing. I have asked him to abstain from this. Atrial fibrillation with a rapid ventricular response could also be contributing and we plan for TEE guided cardioversion later. Alternatively patient may have developed a cardiomyopathy which caused atrial fibrillation. Plan repeat echocardiogram once he has abstained from alcohol and sinus restored most likely in 3 months. #3-acute systolic congestive heart failure-euvolemic; continue present dose of diuretics; BMET one week after DC. #4-hematuria-abdominal CT shows no renal infarct. He will need to followup with urology following discharge. We appreciate their consult. If sinus reestablished this AM and patient stable, plan DC later today and FU with Dr Hochrein in 2-4 weeks and FU with urology Signed, Brian Crenshaw MD 7:19 AM    

## 2012-02-16 NOTE — Discharge Summary (Signed)
See progress notes Tyrone [name]  

## 2012-02-16 NOTE — Discharge Summary (Signed)
Patient ID: Tyrone [name],  MRN: [id number], DOB/AGE: [birth year] 47 y.o.  Admit date: 02/11/2012 Discharge date: 02/16/2012  Primary Care Provider: [facility] Primary Cardiologist: J. Hochrein, MD  Discharge Diagnoses Principal Problem:  *Atrial fibrillation with RVR  *S/P TEE and DCCV this admission.  Active Problems:  Chest pain  *S/P Cath this admission revealing normal cors.  Acute systolic heart failure  *EF 35-40% by echo this admission.  Cardiomyopathy, nonischemic  Hypertension  Hematuria  *Seen by Urology this admission w/ plan for outpt f/u.  History of ETOH abuse  Allergies No Known Allergies  Procedures  2D Echocardiogram 02/12/2012 Study Conclusions  - Left ventricle: The cavity size was mildly dilated. Wall   thickness was normal. Systolic function was moderately   reduced. The estimated ejection fraction was in the range   of 35% to 40%. Diffuse hypokinesis. - Mitral valve: Mild regurgitation. - Left atrium: The atrium was mildly dilated. - Right ventricle: The cavity size was mildly dilated. - Right atrium: The atrium was moderately dilated. - Pericardium, extracardiac: A small pericardial effusion   was identified. _____________  CT Abdomen/Pelvis w/o Contrast 02/11/2012  IMPRESSION: No renal, ureteral, or bladder calculi.  No hydronephrosis. Status post gastric bypass.  No evidence of bowel obstruction. _____________  CT Abdomen/Pelvis with Contrast 02/12/2012  IMPRESSION:  1.  No evidence of renal infarct or other significant abnormality. 2.  Previous gastric bypass surgery again noted. _____________  Cardiac Catheterization 02/13/2012  Procedural Findings: Hemodynamics RA 10 RV 29/12 PA 36/19 with a mean of 25 PCWP 17 LV 130/18 AO 132/86  Oxygen saturations: PA 64 AO 93  Cardiac Output (Fick) 5.8  Cardiac Index (Fick) 2.7            Coronary angiography: Coronary dominance: right  Left mainstem: Widely patent with no  obstructive disease Left anterior descending (LAD): The LAD reaches the left ventricular apex. There is no calcification or obstructive disease seen. There is a large septal perforator branch and a moderate caliber diagonal with no obstructive disease. The first septal perforator runs the course of a twin LAD. Left circumflex (LCx): The left circumflex is widely patent. There is a large first OM with no obstructive disease. The second OM is small. Right coronary artery (RCA): This is a large, dominant vessel. There is minimal plaque at the junction of the mid and distal vessel. There is no obstructive disease present. The PDA and posterolateral branches are patent. Left ventriculography: Deferred _____________  TEE & DCCV 02/16/2012  No LAA thrombus. Patient subsequently sedated by anesthesia with diprovan 70 Mg IV; synchronized DCCV with 120 joules resulted in sinus bradycardia. No immediate complications. Continue pradaxa for at least 4 weeks. _____________  History of Present Illness  46 y/o male with the above problem list.  He was recently seen in clinic in early June 2/2 complaints of atypical chest pain and htn.  He subsequently underwent ETT, which was felt to be indeterminate.  He was otherwise doing well however, on the morning of admission, he awoke with chest pain and dyspnea.  Chest pain worsened with exertion and he presented to the [facility] ED where he was found to be in afib with RVR and rates in the 90's to 100's.  He was also noted to have elevation of pBNP and was felt to be volume overloaded on exam.  Further, pt reported a several day history of hematuria.  Pt was admitted for further evaluation.  Hospital Course  Pt was initially maintained on  IV Diltiazem with reasonable rate control.  A 2D echo was performed on 7/22 revealing reduced LV fxn, with an EF of 35-40% and diffuse hypokinesis.  Cardiac enzymes were negative.  He was transitioned from IV diltiazem to oral metoprolol with  stable rate control but with persistent atrial fibrillation.  Initially, we were reluctant to pursue anticoagulation given h/o hematuria with microscopic hematuria noted on UA this admission.  CT's of the abdomen and pelvis, both with and without contrast, showed no evidence of stones or renal infarct.  Pt was seen by urology on 7/22 who felt that it was safe to add anticoagulation and proceed with cardiac work-up as planned but that pt should f/u with urology as an outpt for repeat UA and consideration of cystoscopy.  Pt was subsequently placed on heparin.  In the setting of a new finding of reduced LV function, with prior history of chest pain, decision was made to pursue diagnostic catheterization.  This was performed on 7/23 and showed mild elevation in right heart pressures and normal coronary arteries.  Pt had been receiving IV lasix and this was eventually transitioned to oral lasix on 7/25.  Over the course of this admission, pts weight has been reduced from 248 on admission to 240 today.  He has diuresed a net negative of 6 liters.  Following catheterization, pt was placed on bid Pradaxa therapy.  After 5 doses, we proceeded with TEE this AM, which showed no evidence of LAA thrombus.  He was subsequently successfully cardioverted using 1 - 120 J synchronized shock, resulting in sinus bradycardia.  Post-cardioversion, pt has maintained sinus rhythm and will be discharged this afternoon in good condition.  We will f/u bmet in 1 week and he has f/u arranged with Dr. Antoine Poche in mid-August.  We will plan to repeat echo in approximately 3 months.  He has been counseled on the importance of ETOH cessation.  Discharge Vitals Blood pressure 110/75, pulse 84, temperature 97.6 F (36.4 C), temperature source Oral, resp. rate 20, height 5\' 6"  (1.676 m), weight 240 lb 15.4 oz (109.3 kg), SpO2 98.00%.  Filed Weights   02/15/12 0322 02/15/12 0500 02/16/12 0500  Weight: 241 lb 10 oz (109.6 kg) 241 lb 10 oz  (109.6 kg) 240 lb 15.4 oz (109.3 kg)   Labs  CBC  Basename 02/16/12 0540 02/15/12 0500  WBC 4.8 5.6  NEUTROABS -- --  HGB 12.6* 12.7*  HCT 36.8* 37.4*  MCV 92.9 93.3  PLT 133* 141*   Basic Metabolic Panel  Basename 02/16/12 0540 02/14/12 0520  NA 140 144  K 3.6 3.6  CL 106 107  CO2 26 25  GLUCOSE 100* 97  BUN 15 12  CREATININE 0.91 0.80  CALCIUM 8.1* 8.1*  MG -- --  PHOS -- --   Liver Function Tests Lab Results  Component Value Date   ALT 22 02/12/2012   AST 13 02/12/2012   ALKPHOS 95 02/12/2012   BILITOT 0.5 02/12/2012   Cardiac Enzymes Lab Results  Component Value Date   CKTOTAL 57 02/12/2012   CKMB 1.2 02/12/2012   TROPONINI <0.30 02/12/2012   BNP  pBNP  1106.0  Fasting Lipid Panel Lab Results  Component Value Date   CHOL 125 02/13/2012   HDL 62 02/13/2012   LDLCALC 55 02/13/2012   TRIG 39 02/13/2012   CHOLHDL 2.0 02/13/2012   Thyroid Function Tests Lab Results  Component Value Date   TSH 2.877 02/12/2012   Disposition  Pt is being discharged  home today in good condition.  Follow-up Plans & Appointments  Follow-up Information    Follow up with Rollene Rotunda, MD on 03/07/2012. (2:45 PM)    Contact information:   [address] [place] [address] [phone]       Follow up with [facility], MD in 1 week. (For Blood Chemistry - Rx provided.)    Contact information:   [address]  [place] [address] [phone]       Follow up with Antony Haste, MD. (Follow up in 2-4 wks as previously discussed.)    Contact information:   [address] [facility] [address] [facility] [place] [address] [phone]        Discharge Medications  Medication List  As of 02/16/2012 12:53 PM   TAKE these medications         CALCIUM 500+D PO   Take 1 tablet by mouth daily.      dabigatran 150 MG Caps   Commonly known as:  PRADAXA   Take 1 capsule (150 mg total) by mouth every 12 (twelve) hours.      furosemide 40 MG tablet   Commonly known as: LASIX   Take 1 tablet (40 mg total) by mouth daily.      losartan 50 MG tablet   Commonly known as: COZAAR   Take 1 tablet (50 mg total) by mouth 2 (two) times daily.      metoprolol 100 MG tablet   Commonly known as: LOPRESSOR   Take 1 tablet (100 mg total) by mouth 2 (two) times daily.      multivitamin tablet   Take 3 tablets by mouth daily.      spironolactone 50 MG tablet   Commonly known as: ALDACTONE   Take 0.5 tablets (25 mg total) by mouth daily.      VITAMIN B12 PO   Take 1 tablet by mouth daily.      vitamin E 400 UNIT capsule   Generic drug: vitamin E   Take 400 Units by mouth daily.          Outstanding Labs/Studies  F/U BMET in 1 wk.  Duration of Discharge Encounter   Greater than 30 minutes including physician time.  Signed, Nicolasa Ducking NP 02/16/2012, 12:53 PM

## 2012-02-16 NOTE — Anesthesia Postprocedure Evaluation (Signed)
  Anesthesia Post-op Note  Patient: Tyrone [name]  Procedure(s) Performed: Procedure(s) (LRB): TRANSESOPHAGEAL ECHOCARDIOGRAM (TEE) (N/A) CARDIOVERSION (N/A)  Patient Location: PACU  Anesthesia Type: General  Level of Consciousness: awake, alert , oriented and patient cooperative  Airway and Oxygen Therapy: Patient Spontanous Breathing  Post-op Pain: none  Post-op Assessment: Post-op Vital signs reviewed, Patient's Cardiovascular Status Stable, Respiratory Function Stable, Patent Airway, No signs of Nausea or vomiting and Pain level controlled  Post-op Vital Signs: Reviewed and stable  Complications: No apparent anesthesia complications

## 2012-02-16 NOTE — Anesthesia Preprocedure Evaluation (Addendum)
Anesthesia Evaluation  Patient identified by MRN, date of birth, ID band Patient awake    Reviewed: Allergy & Precautions, H&P , NPO status , Patient's Chart, lab work & pertinent test results, reviewed documented beta blocker date and time   History of Anesthesia Complications Negative for: history of anesthetic complications  Airway Mallampati: III TM Distance: >3 FB Neck ROM: Full    Dental  (+) Teeth Intact and Dental Advisory Given   Pulmonary sleep apnea and Continuous Positive Airway Pressure Ventilation ,  CPAP nightly breath sounds clear to auscultation  Pulmonary exam normal       Cardiovascular hypertension, Pt. on medications and Pt. on home beta blockers + dysrhythmias (TEE today: EF 45%) Atrial Fibrillation Rhythm:Irregular Rate:Normal     Neuro/Psych !990s Guillaine Barre no residual  Neuromuscular disease    GI/Hepatic Neg liver ROS, GERD-  Controlled,S/p gastric bypass   Endo/Other  negative endocrine ROSMorbid obesity  Renal/GU negative Renal ROSTrace amounts of blood in urine.  2nd to passed calculi?     Musculoskeletal negative musculoskeletal ROS (+)   Abdominal Normal abdominal exam  (+) + obese,   Peds  Hematology negative hematology ROS (+)   Anesthesia Other Findings   Reproductive/Obstetrics                          Anesthesia Physical Anesthesia Plan  ASA: III  Anesthesia Plan: General   Post-op Pain Management:    Induction: Intravenous  Airway Management Planned: Mask  Additional Equipment:   Intra-op Plan:   Post-operative Plan:   Informed Consent: I have reviewed the patients History and Physical, chart, labs and discussed the procedure including the risks, benefits and alternatives for the proposed anesthesia with the patient or authorized representative who has indicated his/her understanding and acceptance.   Dental advisory given  Plan  Discussed with: CRNA and Anesthesiologist  Anesthesia Plan Comments: (Plan routine monitors, GA for cardioversion)       Anesthesia Quick Evaluation

## 2012-02-16 NOTE — H&P (View-Only) (Signed)
Patient: Tyrone [name] Date of Encounter: 02/16/2012, 7:19 AM Admit date: 02/11/2012     Subjective  No CP; dyspnea resolved    Objective   Telemetry: atrial fib Physical Exam: Filed Vitals:   02/16/12 0400  BP: 116/68  Pulse:   Temp: 97.9 F (36.6 C)  Resp: 18   General: Well developed, well nourished, in no acute distress. Head: Normal Neck: Supple Lungs: Clear bilaterally to auscultation without wheezes, rales, or rhonchi. Breathing is unlabored. Heart: Irregular Abdomen: Soft, non-tender, non-distended  Extremities: No edema.   Neuro: Alert and oriented X 3. Moves all extremities spontaneously.    Intake/Output Summary (Last 24 hours) at 02/16/12 0719 Last data filed at 02/16/12 0300  Gross per 24 hour  Intake   1320 ml  Output   2450 ml  Net  -1130 ml    Inpatient Medications:     . calcium-vitamin D  1 tablet Oral Daily  . dabigatran  150 mg Oral Q12H  . furosemide  40 mg Oral Daily  . losartan  50 mg Oral BID  . metoprolol tartrate  100 mg Oral BID  . multivitamin with minerals  3 tablet Oral Daily  . spironolactone  25 mg Oral Daily  . vitamin B-12  100 mcg Oral Daily  . vitamin E  400 Units Oral Daily  . DISCONTD: aspirin  81 mg Oral Daily    Labs:  [facility] 02/16/12 0540 02/14/12 0520  NA 140 144  K 3.6 3.6  CL 106 107  CO2 26 25  GLUCOSE 100* 97  BUN 15 12  CREATININE 0.91 0.80  CALCIUM 8.1* 8.1*  MG -- --  PHOS -- --    Basename 02/16/12 0540 02/15/12 0500  WBC 4.8 5.6  NEUTROABS -- --  HGB 12.6* 12.7*  HCT 36.8* 37.4*  MCV 92.9 93.3  PLT 133* 141*   No results found for this basename: CKTOTAL:4,CKMB:4,TROPONINI:4 in the last 72 hours Radiology/Studies:  Ct Abdomen Pelvis Wo Contrast 02/11/2012  *RADIOLOGY REPORT*  Clinical Data: Shortness of breath, hematuria, history of gastric bypass  CT ABDOMEN AND PELVIS WITHOUT CONTRAST  Technique:  Multidetector CT imaging of the abdomen and pelvis was performed following the standard  protocol without intravenous contrast.  Comparison: None.  Findings: Lung bases are clear.  The heart is top normal in size.  Small hiatal hernia.  Status post gastric bypass.  Unenhanced liver, spleen, pancreas, and adrenal glands are within normal limits.  Gallbladder is grossly unremarkable.  No intrahepatic or extrahepatic ductal dilatation.  Kidneys are unremarkable.  No renal calculi or hydronephrosis.  No evidence of bowel obstruction.  Normal appendix.  No colonic wall thickening or inflammatory changes.  No evidence of abdominal aortic aneurysm.  No abdominopelvic ascites.  Small retroperitoneal lymph nodes pathologic CT size criteria.  Prostate is unremarkable.  No ureteral or bladder calculi.  Mild degenerative changes of the visualized thoracolumbar spine.  IMPRESSION: No renal, ureteral, or bladder calculi.  No hydronephrosis.  Status post gastric bypass.  No evidence of bowel obstruction.  Original Report Authenticated By: Charline Bills, M.D.   Dg Chest 2 View 02/11/2012  *RADIOLOGY REPORT*  Clinical Data: Chest pain, short of breath, cough  CHEST - 2 VIEW  Comparison: None.  Findings: Prominent heart size with vascular and interstitial prominence.  Component of early edema versus volume overload not excluded.  No focal pneumonia, collapse, consolidation.  No effusion pneumothorax.  Trachea midline.  IMPRESSION: Borderline heart size with vascular and  basilar interstitial prominence.  Original Report Authenticated By: Judie Petit. Ruel Favors, M.D.     Assessment and Plan  #1-atrial fibrillation-patient remains in atrial fibrillation. Continue metoprolol. Cardiac catheterization revealed no obstructive coronary disease; will proceed with transesophageal echocardiogram guided cardioversion today; continue pradaxa. #2-cardiomyopathy-echocardiogram shows reduced LV function. Continue beta blocker and ARB. Etiology of cardiomyopathy unclear. Cardiac catheterization shows no coronary disease. Note TSH  normal. Patient states he consumes 1/5 of alcohol per week. This certainly could be contributing. I have asked him to abstain from this. Atrial fibrillation with a rapid ventricular response could also be contributing and we plan for TEE guided cardioversion later. Alternatively patient may have developed a cardiomyopathy which caused atrial fibrillation. Plan repeat echocardiogram once he has abstained from alcohol and sinus restored most likely in 3 months. #3-acute systolic congestive heart failure-euvolemic; continue present dose of diuretics; BMET one week after DC. #4-hematuria-abdominal CT shows no renal infarct. He will need to followup with urology following discharge. We appreciate their consult. If sinus reestablished this AM and patient stable, plan DC later today and FU with Dr Antoine Poche in 2-4 weeks and FU with urology Signed, Olga Millers MD 7:19 AM

## 2012-02-16 NOTE — Interval H&P Note (Signed)
History and Physical Interval Note:  02/16/2012 9:43 AM  Tyrone [name]  has presented today for surgery, with the diagnosis of afib  The various methods of treatment have been discussed with the patient and family. After consideration of risks, benefits and other options for treatment, the patient has consented to  Procedure(s) (LRB): TRANSESOPHAGEAL ECHOCARDIOGRAM (TEE) (N/A) CARDIOVERSION (N/A) as a surgical intervention .  The patient's history has been reviewed, patient examined, no change in status, stable for surgery.  I have reviewed the patient's chart and labs.  Questions were answered to the patient's satisfaction.     Olga Millers

## 2012-02-16 NOTE — Progress Notes (Signed)
  Echocardiogram Echocardiogram Transesophageal has been performed.  Merrick Feutz, [facility] 02/16/2012, 10:13 AM

## 2012-02-16 NOTE — CV Procedure (Signed)
See report in [employer]. No LAA thrombus. Patient subsequently sedated by anesthesia with diprovan 70 Mg IV; synchronized DCCV with 120 joules resulted in sinus bradycardia. No immediate complications. Continue pradaxa for at least 4 weeks Tyrone [name]

## 2012-02-19 ENCOUNTER — Encounter (HOSPITAL_COMMUNITY): Payer: Self-pay | Admitting: Cardiology

## 2012-02-20 ENCOUNTER — Telehealth: Payer: Self-pay | Admitting: Cardiology

## 2012-02-20 MED ORDER — AMLODIPINE BESYLATE 2.5 MG PO TABS: 2.5000 mg | ORAL_TABLET | Freq: Every day | ORAL | Status: DC

## 2012-02-20 MED ORDER — METOPROLOL TARTRATE 100 MG PO TABS: 50.0000 mg | ORAL_TABLET | Freq: Two times a day (BID) | ORAL | Status: DC

## 2012-02-20 NOTE — Telephone Encounter (Signed)
Pt per call - he had a cardioversion last week was discharged on Friday and felt sluggish thru the weekend.  He reports his HR is staying between 45-50 and he feels "cloudy in my mind".  He is also concerned because his BP is staying elevated this am it was 144/104 and it has been this way mainly since he came home.  He reports his BP was fine during his hospitalization.  He is taking all medications as listed.  Reviewed with Ward Givens, NP - pt to decrease Metoprolol to 50 mg twice a day and start Amlodipine 2.5 mg a day.  All other medications should remain the same.  He should continue to watch vital signs call with any problems and follow up with Dr Antoine Poche as scheduled.  Reviewed all orders with pt who states understanding.  He will call back if further trouble.  Rx for amlodipine sent into [facility] as requested.

## 2012-02-20 NOTE — Telephone Encounter (Signed)
New Problem:    Patient called because his heart rate is extremely low Heart Rate and BP after having his cath and cardioversion and being discharged form the hospital and would like to know how to proceed.  Please call back.

## 2012-02-28 ENCOUNTER — Other Ambulatory Visit: Payer: Self-pay | Admitting: Cardiology

## 2012-02-28 LAB — BASIC METABOLIC PANEL
BUN: 13 mg/dL (ref 6–23)
CO2: 28 mEq/L (ref 19–32)
Calcium: 8.9 mg/dL (ref 8.4–10.5)
Chloride: 105 mEq/L (ref 96–112)
Creat: 0.88 mg/dL (ref 0.50–1.35)
Glucose, Bld: 82 mg/dL (ref 70–99)
Potassium: 4.7 mEq/L (ref 3.5–5.3)
Sodium: 139 mEq/L (ref 135–145)

## 2012-03-07 ENCOUNTER — Encounter: Payer: Self-pay | Admitting: Cardiology

## 2012-03-07 ENCOUNTER — Ambulatory Visit (INDEPENDENT_AMBULATORY_CARE_PROVIDER_SITE_OTHER): Payer: BC Managed Care – PPO | Admitting: Cardiology

## 2012-03-07 ENCOUNTER — Other Ambulatory Visit: Payer: BC Managed Care – PPO

## 2012-03-07 VITALS — BP 148/89 | HR 42 | Ht 66.0 in | Wt 239.8 lb

## 2012-03-07 DIAGNOSIS — I10 Essential (primary) hypertension: Secondary | ICD-10-CM

## 2012-03-07 DIAGNOSIS — I428 Other cardiomyopathies: Secondary | ICD-10-CM

## 2012-03-07 DIAGNOSIS — I4891 Unspecified atrial fibrillation: Secondary | ICD-10-CM

## 2012-03-07 MED ORDER — METOPROLOL TARTRATE 25 MG PO TABS: 25.0000 mg | ORAL_TABLET | Freq: Two times a day (BID) | ORAL | Status: DC

## 2012-03-07 MED ORDER — AMLODIPINE BESYLATE 5 MG PO TABS: 5.0000 mg | ORAL_TABLET | Freq: Every day | ORAL | Status: DC

## 2012-03-07 NOTE — Patient Instructions (Addendum)
Your physician has recommended you make the following change in your medication: Decrease your metoprolol to 25mg  twice daily and Increase your amlodipine to 5mg  daily  Your physician recommends that you schedule a follow-up appointment in: 1 month

## 2012-03-07 NOTE — Progress Notes (Signed)
HPI The patient presents for followup after recent hospitalization for atrial fibrillation.  He was found to have an ejection fraction of approximately 35-40%. However, cardiac catheterization demonstrated normal coronary arteries. He did undergo TEE guided cardioversion. He's been treated with beta blockers on discharge. However, he's had significant fatigue and bradycardia. His metoprolol was reduced to 50 mg twice a day. He says he feels better but still is much more fatigued than he has been previously. He gets more tired at night. He's not describing any PND or orthopnea. She's not noticing any palpitations, presyncope or syncope. He's had no weight gain or edema. Of note he's had no hematuria that was also a problem in the hospital. I have reviewed all available hospital records.  No Known Allergies  Current Outpatient Prescriptions  Medication Sig Dispense Refill  . amLODipine (NORVASC) 2.5 MG tablet Take 1 tablet (2.5 mg total) by mouth daily.  30 tablet  11  . Calcium Carbonate-Vitamin D (CALCIUM 500+D PO) Take 1 tablet by mouth daily.       . Calcium Citrate (CITRACAL PO) Take by mouth daily. tums as needed      . Cyanocobalamin (VITAMIN B12 PO) Take 1 tablet by mouth daily.       . dabigatran (PRADAXA) 150 MG CAPS Take 1 capsule (150 mg total) by mouth every 12 (twelve) hours.  60 capsule  6  . furosemide (LASIX) 40 MG tablet Take 1 tablet (40 mg total) by mouth daily.  30 tablet  6  . losartan (COZAAR) 50 MG tablet Take 1 tablet (50 mg total) by mouth 2 (two) times daily.  60 tablet  6  . metoprolol (LOPRESSOR) 100 MG tablet Take 0.5 tablets (50 mg total) by mouth 2 (two) times daily.  60 tablet  6  . Multiple Vitamin (MULTIVITAMIN) tablet Take 3 tablets by mouth daily.       Marland Kitchen spironolactone (ALDACTONE) 50 MG tablet Take 0.5 tablets (25 mg total) by mouth daily.  30 tablet  6  . vitamin E (VITAMIN E) 400 UNIT capsule Take 400 Units by mouth daily.        Past Medical History    Diagnosis Date  . External thrombosed hemorrhoids   . Chest pain   . Osteoarthrosis, unspecified whether generalized or localized, ankle and foot     of the knees,ankle and foot  . Guillain-Barre syndrome   . Osteoarthrosis, unspecified whether generalized or localized, lower leg   . Hypersomnia with sleep apnea, unspecified   . Cervicalgia   . Vitamin B 12 deficiency   . Reflux esophagitis   . Urticaria, unspecified   . Sinoatrial node dysfunction   . Morbid obesity   . Hernia of unspecified site of abdominal cavity without mention of obstruction or gangrene     of abdominal  . Unspecified intestinal malabsorption   . Pancytopenia   . Hypertension   . Sleep apnea     CPAP  . GERD (gastroesophageal reflux disease)   . Atrial fibrillation     a. Pradaxa started 01/2012;  b .s/p TEE/DCCV 02/16/2012  . Nonischemic cardiomyopathy     a. 01/2012 Echo: EF 35-40%, Diff HK, mild MR;  b. 01/2012 R&L Heart Cath: mildly elevated R heart pressures (pcwp 17) , nl cors.  . Chronic systolic CHF (congestive heart failure)     a. 01/2012 Echo: EF 35-40%, Diff HK, mild MR  . Hematuria     a. 01/2012 - to f/u with urology  as outpt.    Past Surgical History  Procedure Date  . Gastric bypass 2004  . Umbilical hernia repair     x 2  . Hernia repair   . Tee without cardioversion 02/16/2012    Procedure: TRANSESOPHAGEAL ECHOCARDIOGRAM (TEE);  Surgeon: [name], MD;  Location: [facility] ENDOSCOPY;  Service: Cardiovascular;  Laterality: N/A;  10a CV  . Cardioversion 02/16/2012    Procedure: CARDIOVERSION;  Surgeon: [name], MD;  Location: [facility] ENDOSCOPY;  Service: Cardiovascular;  Laterality: N/A;    ROS: As stated in the HPI and negative for all other systems.   PHYSICAL EXAM BP 148/89  Pulse 42  Ht 5\' 6"  (1.676 m)  Wt 239 lb 12.8 oz (108.773 kg)  BMI 38.70 kg/m2 GENERAL:  Well appearing HEENT:  Pupils equal round and reactive, fundi not visualized, oral mucosa unremarkable NECK:  No  jugular venous distention, waveform within normal limits, carotid upstroke brisk and symmetric, no bruits, no thyromegaly LYMPHATICS:  No cervical, inguinal adenopathy LUNGS:  Clear to auscultation bilaterally BACK:  No CVA tenderness CHEST:  Unremarkable HEART:  PMI not displaced or sustained,S1 and S2 within normal limits, no S3, no S4, no clicks, no rubs, soft apical systolic murmur ABD:  Flat, positive bowel sounds normal in frequency in pitch, no bruits, no rebound, no guarding, no midline pulsatile mass, no hepatomegaly, no splenomegaly, obese EXT:  2 plus pulses throughout, no edema, no cyanosis no clubbing  Sinus bradycardia, rate 44, axis within normal limits, short PR interval, no acute ST-T wave changes.  03/07/2012  ASSESSMENT AND PLAN   Atrial fibrillation -  He is maintaining sinus rhythm. However, with a sinus bradycardia file have to reduce his beta blocker. His blood pressures will be addressed as below.  Hypertension -  I reviewed his blood pressure diary and somewhat labile but more consistently elevated. As in reducing his metoprolol 25 mg twice a day for have to go up on his Norvasc to 5 mg daily. He'll likely need further med titration. Certainly weight loss would be important and we discussed this.  Cardiomyopathy -  The etiology of this is not clear though it could be related to his hypertension or sleep apnea. I do not think this is related to alcohol. There was a mention in the hospital of an alcohol problem but I have reviewed this carefully with the patient and he drinks socially and not to excess. I have suggested he avoid completely with the A. fib and cardiomyopathy. We've also discussed whether his sleep apnea is well treated and he thinks it is. Most importantly I will continue with blood pressure control.  Obesity -  The patient understands the need to lose weight with diet and exercise. We have discussed specific strategies for this.  I think this i to his  improvement. s pivotal

## 2012-03-22 ENCOUNTER — Telehealth: Payer: Self-pay | Admitting: Cardiology

## 2012-03-22 ENCOUNTER — Encounter: Payer: Self-pay | Admitting: *Deleted

## 2012-03-22 ENCOUNTER — Ambulatory Visit (INDEPENDENT_AMBULATORY_CARE_PROVIDER_SITE_OTHER): Payer: BC Managed Care – PPO | Admitting: *Deleted

## 2012-03-22 VITALS — BP 118/79 | HR 80 | Resp 18 | Ht 66.0 in | Wt 234.0 lb

## 2012-03-22 DIAGNOSIS — I4891 Unspecified atrial fibrillation: Secondary | ICD-10-CM

## 2012-03-22 NOTE — Progress Notes (Signed)
Patient had a  Cardioversion in July 26 th 2013. Pt  called this morning, because he said since yesterday, he has been feeling tired, and having some SOB. An EKG was done per RN and read per Dr. Tenny Craw, MD. DOD. A-fib 80 beats/minutes. BP 118/79 left arm Dr. Tenny Craw would like  for Dr. Antoine Poche to review symptoms and pt's rhythm and make recommendation about a medication that will keep pt in sinus rhythm. Pt to continue taken prescribed medications.   Patient aware of Dr. Tenny Craw' recommendations.

## 2012-03-22 NOTE — Telephone Encounter (Signed)
Please return call to patient [phone] regarding question about medical care

## 2012-03-22 NOTE — Telephone Encounter (Signed)
Pt calling stating he thinks he has gone back into a-fib--advised to come in for EKG--dr hochrein aware

## 2012-03-22 NOTE — Telephone Encounter (Signed)
Mr Delduca calling stating he thinks he is back in a-fib--will bring in for nurse visit for EKG

## 2012-03-26 ENCOUNTER — Telehealth: Payer: Self-pay | Admitting: Cardiology

## 2012-03-26 NOTE — Telephone Encounter (Signed)
The patient is back in atrial fib.  His rates are OK.  His pressure is low.  I will stop the Norvasc.  I will see him in the office tomorrow.

## 2012-03-26 NOTE — Telephone Encounter (Signed)
Please see note by Ollen Gross, RN re EKG and need for recommendations

## 2012-03-27 ENCOUNTER — Ambulatory Visit (INDEPENDENT_AMBULATORY_CARE_PROVIDER_SITE_OTHER): Payer: BC Managed Care – PPO | Admitting: Cardiology

## 2012-03-27 ENCOUNTER — Encounter: Payer: Self-pay | Admitting: Cardiology

## 2012-03-27 VITALS — BP 123/80 | HR 53 | Ht 66.0 in | Wt 236.4 lb

## 2012-03-27 DIAGNOSIS — I4891 Unspecified atrial fibrillation: Secondary | ICD-10-CM

## 2012-03-27 LAB — BASIC METABOLIC PANEL
BUN: 11 mg/dL (ref 6–23)
CO2: 27 mEq/L (ref 19–32)
Calcium: 8.6 mg/dL (ref 8.4–10.5)
Chloride: 104 mEq/L (ref 96–112)
Creatinine, Ser: 0.9 mg/dL (ref 0.4–1.5)
GFR: 91.26 mL/min (ref >60.00)
Glucose, Bld: 88 mg/dL (ref 70–99)
Potassium: 4.1 mEq/L (ref 3.5–5.1)
Sodium: 136 mEq/L (ref 135–145)

## 2012-03-27 LAB — MAGNESIUM: Magnesium: 2 mg/dL (ref 1.5–2.5)

## 2012-03-27 NOTE — Progress Notes (Signed)
HPI  The patient presents for followup after recent hospitalization for atrial fibrillation. He was found to have an ejection fraction of approximately 35-40%. However, cardiac catheterization demonstrated normal coronary arteries. He did undergo TEE guided cardioversion. He's been treated with beta blockers on discharge. However, he's had significant fatigue and bradycardia. His metoprolol was reduced.  Since the last visit he noticed his heart rate was irregular on his monitor. His blood pressure was lower. He came in the other day and actually was back in atrial fibrillation as he thought. He's perhaps been slightly more short of breath but not as severe as he was when he was hospitalized. He's not had any PND or orthopnea. He has not had any chest pressure, neck or arm discomfort. He did stop his Norvasc per my direction as his blood pressure was running low we sent him off for this appointment.  No Known Allergies  Current Outpatient Prescriptions  Medication Sig Dispense Refill  . metoprolol (LOPRESSOR) 25 MG tablet Take 1 tablet (25 mg total) by mouth 2 (two) times daily.  60 tablet  6  . Multiple Vitamin (MULTIVITAMIN) tablet Take 3 tablets by mouth daily.       Marland Kitchen spironolactone (ALDACTONE) 50 MG tablet Take 0.5 tablets (25 mg total) by mouth daily.  30 tablet  6  . vitamin E (VITAMIN E) 400 UNIT capsule Take 400 Units by mouth daily.      . Calcium Citrate (CITRACAL PO) Take by mouth daily. tums as needed      . Cyanocobalamin (VITAMIN B12 PO) Take 1 tablet by mouth daily.       . dabigatran (PRADAXA) 150 MG CAPS Take 1 capsule (150 mg total) by mouth every 12 (twelve) hours.  60 capsule  6  . furosemide (LASIX) 40 MG tablet Take 1 tablet (40 mg total) by mouth daily.  30 tablet  6  . losartan (COZAAR) 50 MG tablet Take 1 tablet (50 mg total) by mouth 2 (two) times daily.  60 tablet  6    Past Medical History  Diagnosis Date  . External thrombosed hemorrhoids   . Chest pain   .  Osteoarthrosis, unspecified whether generalized or localized, ankle and foot     of the knees,ankle and foot  . Guillain-Barre syndrome   . Osteoarthrosis, unspecified whether generalized or localized, lower leg   . Hypersomnia with sleep apnea, unspecified   . Cervicalgia   . Vitamin B 12 deficiency   . Reflux esophagitis   . Urticaria, unspecified   . Sinoatrial node dysfunction   . Morbid obesity   . Hernia of unspecified site of abdominal cavity without mention of obstruction or gangrene     of abdominal  . Unspecified intestinal malabsorption   . Pancytopenia   . Hypertension   . Sleep apnea     CPAP  . GERD (gastroesophageal reflux disease)   . Atrial fibrillation     a. Pradaxa started 01/2012;  b .s/p TEE/DCCV 02/16/2012  . Nonischemic cardiomyopathy     a. 01/2012 Echo: EF 35-40%, Diff HK, mild MR;  b. 01/2012 R&L Heart Cath: mildly elevated R heart pressures (pcwp 17) , nl cors.  . Chronic systolic CHF (congestive heart failure)     a. 01/2012 Echo: EF 35-40%, Diff HK, mild MR  . Hematuria     a. 01/2012 - to f/u with urology as outpt.    Past Surgical History  Procedure Date  . Gastric bypass 2004  .  Umbilical hernia repair     x 2  . Hernia repair   . Tee without cardioversion 02/16/2012    Procedure: TRANSESOPHAGEAL ECHOCARDIOGRAM (TEE);  Surgeon: [name], MD;  Location: [facility] ENDOSCOPY;  Service: Cardiovascular;  Laterality: N/A;  10a CV  . Cardioversion 02/16/2012    Procedure: CARDIOVERSION;  Surgeon: [name], MD;  Location: [facility] ENDOSCOPY;  Service: Cardiovascular;  Laterality: N/A;    Family History  Problem Relation Age of Onset  . Hypertension Mother   . Hypertension Father   . Heart attack Father 55    Died  . Diabetes Father   . Kidney disease Father     with transplant    History   Social History  . Marital Status: Married    Spouse Name: N/A    Number of Children: 1  . Years of Education: N/A   Occupational History  . Realtor      Social History Main Topics  . Smoking status: Never Smoker   . Smokeless tobacco: Former Neurosurgeon    Types: Chew    Quit date: 11/11/2002  . Alcohol Use: Yes  . Drug Use: Not on file  . Sexually Active: Not on file   Other Topics Concern  . Not on file   Social History Narrative   Lives with wife.      ROS:  As stated in the HPI and negative for all other systems.  PHYSICAL EXAM BP 123/80  Pulse 53  Ht 5\' 6"  (1.676 m)  Wt 236 lb 6.4 oz (107.23 kg)  BMI 38.16 kg/m2 GENERAL:  Well appearing HEENT:  Pupils equal round and reactive, fundi not visualized, oral mucosa unremarkable NECK:  No jugular venous distention, waveform within normal limits, carotid upstroke brisk and symmetric, no bruits, no thyromegaly LYMPHATICS:  No cervical, inguinal adenopathy LUNGS:  Clear to auscultation bilaterally BACK:  No CVA tenderness CHEST:  Unremarkable HEART:  PMI not displaced or sustained,S1 and S2 within normal limits, no S3 , no clicks, no rubs, no murmurs, irregular ABD:  Flat, positive bowel sounds normal in frequency in pitch, no bruits, no rebound, no guarding, no midline pulsatile mass, no hepatomegaly, no splenomegaly EXT:  2 plus pulses throughout, no edema, no cyanosis no clubbing SKIN:  No rashes no nodules NEURO:  Cranial nerves II through XII grossly intact, motor grossly intact throughout PSYCH:  Cognitively intact, oriented to person place and time   EKG:  Atrial fibrillation, rate 80s, axis within normal limits, RSR prime V1, premature atrial contractions, no acute ST-T wave changes. 03/27/2012  ASSESSMENT AND PLAN  Atrial fibrillation -  At this point I think the best treatment option would be hospitalization to initiate Tikosyn.  We discussed this at length. He thinks he will do this next week. In anticipation I will check a basic metabolic profile and magnesium. He has been on therapeutic anticoagulation.   Hypertension -  Is tolerating his lower blood pressure. For  now he will continue the medications as listed.   Cardiomyopathy -  His blood pressure 1 allow med titration. He seems to be euvolemic. We'll follow this with hopefully med titration in the future and with followup echocardiograms.   Obesity -  The patient understands the need to lose weight with diet and exercise. We have discussed specific strategies for this. I think this i to his improvement. s pivotal

## 2012-03-27 NOTE — Patient Instructions (Addendum)
The current medical regimen is effective;  continue present plan and medications.  Please have blood work today (BMP and MG)  Please call to let us know when you want to be admitted.  [phone]

## 2012-04-05 ENCOUNTER — Inpatient Hospital Stay (HOSPITAL_COMMUNITY)
Admission: AD | Admit: 2012-04-05 | Discharge: 2012-04-08 | DRG: 544 | Disposition: A | Discharge status: Home / Self Care | Payer: BC Managed Care – PPO | Source: Ambulatory Visit | Attending: Cardiology | Admitting: Cardiology

## 2012-04-05 ENCOUNTER — Encounter (HOSPITAL_COMMUNITY): Payer: Self-pay | Admitting: Nurse Practitioner

## 2012-04-05 ENCOUNTER — Encounter: Payer: Self-pay | Admitting: Pharmacist

## 2012-04-05 ENCOUNTER — Ambulatory Visit (INDEPENDENT_AMBULATORY_CARE_PROVIDER_SITE_OTHER): Payer: BC Managed Care – PPO | Admitting: Pharmacist

## 2012-04-05 VITALS — BP 140/80 | Wt 240.0 lb

## 2012-04-05 DIAGNOSIS — E876 Hypokalemia: Secondary | ICD-10-CM | POA: Diagnosis present

## 2012-04-05 DIAGNOSIS — Z9884 Bariatric surgery status: Secondary | ICD-10-CM

## 2012-04-05 DIAGNOSIS — K2100 Gastro-esophageal reflux disease with esophagitis, without bleeding: Secondary | ICD-10-CM

## 2012-04-05 DIAGNOSIS — E669 Obesity, unspecified: Secondary | ICD-10-CM

## 2012-04-05 DIAGNOSIS — I509 Heart failure, unspecified: Secondary | ICD-10-CM | POA: Diagnosis present

## 2012-04-05 DIAGNOSIS — I5022 Chronic systolic (congestive) heart failure: Secondary | ICD-10-CM | POA: Diagnosis present

## 2012-04-05 DIAGNOSIS — K219 Gastro-esophageal reflux disease without esophagitis: Secondary | ICD-10-CM | POA: Diagnosis present

## 2012-04-05 DIAGNOSIS — I4891 Unspecified atrial fibrillation: Secondary | ICD-10-CM

## 2012-04-05 DIAGNOSIS — I428 Other cardiomyopathies: Secondary | ICD-10-CM | POA: Diagnosis present

## 2012-04-05 DIAGNOSIS — E65 Localized adiposity: Secondary | ICD-10-CM | POA: Diagnosis present

## 2012-04-05 DIAGNOSIS — G4730 Sleep apnea, unspecified: Secondary | ICD-10-CM | POA: Diagnosis present

## 2012-04-05 DIAGNOSIS — I5042 Chronic combined systolic (congestive) and diastolic (congestive) heart failure: Secondary | ICD-10-CM

## 2012-04-05 DIAGNOSIS — I10 Essential (primary) hypertension: Secondary | ICD-10-CM

## 2012-04-05 DIAGNOSIS — I495 Sick sinus syndrome: Secondary | ICD-10-CM | POA: Diagnosis present

## 2012-04-05 DIAGNOSIS — Z8249 Family history of ischemic heart disease and other diseases of the circulatory system: Secondary | ICD-10-CM

## 2012-04-05 LAB — BASIC METABOLIC PANEL
BUN: 10 mg/dL (ref 6–23)
CO2: 26 mEq/L (ref 19–32)
Calcium: 8.5 mg/dL (ref 8.4–10.5)
Chloride: 110 mEq/L (ref 96–112)
Creatinine, Ser: 0.9 mg/dL (ref 0.4–1.5)
GFR: 99.77 mL/min (ref >60.00)
Glucose, Bld: 95 mg/dL (ref 70–99)
Potassium: 4.1 mEq/L (ref 3.5–5.1)
Sodium: 141 mEq/L (ref 135–145)

## 2012-04-05 LAB — MAGNESIUM: Magnesium: 1.9 mg/dL (ref 1.5–2.5)

## 2012-04-05 MED ORDER — SODIUM CHLORIDE 0.9 % IJ SOLN
3.0000 mL | Freq: Two times a day (BID) | INTRAMUSCULAR | Status: DC
  Administered 2012-04-05 – 2012-04-07 (×5): 3 mL via INTRAVENOUS

## 2012-04-05 MED ORDER — SODIUM CHLORIDE 0.9 % IV SOLN: 250.0000 mL | INTRAVENOUS | Status: DC | PRN

## 2012-04-05 MED ORDER — METOPROLOL TARTRATE 25 MG PO TABS
25.0000 mg | ORAL_TABLET | Freq: Two times a day (BID) | ORAL | Status: DC
  Administered 2012-04-05 – 2012-04-08 (×6): 25 mg via ORAL
  Filled 2012-04-05 (×7): qty 1

## 2012-04-05 MED ORDER — NITROGLYCERIN 0.4 MG SL SUBL: 0.4000 mg | SUBLINGUAL_TABLET | SUBLINGUAL | Status: DC | PRN

## 2012-04-05 MED ORDER — ACETAMINOPHEN 325 MG PO TABS: 650.0000 mg | ORAL_TABLET | ORAL | Status: DC | PRN

## 2012-04-05 MED ORDER — SPIRONOLACTONE 25 MG PO TABS
25.0000 mg | ORAL_TABLET | Freq: Every day | ORAL | Status: DC
  Administered 2012-04-06 – 2012-04-08 (×3): 25 mg via ORAL
  Filled 2012-04-05 (×4): qty 1

## 2012-04-05 MED ORDER — ONE-DAILY MULTI VITAMINS PO TABS: 1.0000 | ORAL_TABLET | Freq: Every day | ORAL | Status: DC

## 2012-04-05 MED ORDER — FUROSEMIDE 40 MG PO TABS
40.0000 mg | ORAL_TABLET | Freq: Every day | ORAL | Status: DC
  Administered 2012-04-06 – 2012-04-07 (×2): 40 mg via ORAL
  Filled 2012-04-05 (×3): qty 1

## 2012-04-05 MED ORDER — DABIGATRAN ETEXILATE MESYLATE 150 MG PO CAPS
150.0000 mg | ORAL_CAPSULE | Freq: Two times a day (BID) | ORAL | Status: DC
  Administered 2012-04-05 – 2012-04-08 (×6): 150 mg via ORAL
  Filled 2012-04-05 (×7): qty 1

## 2012-04-05 MED ORDER — LOSARTAN POTASSIUM 50 MG PO TABS
50.0000 mg | ORAL_TABLET | Freq: Two times a day (BID) | ORAL | Status: DC
  Administered 2012-04-05 – 2012-04-08 (×6): 50 mg via ORAL
  Filled 2012-04-05 (×7): qty 1

## 2012-04-05 MED ORDER — ONDANSETRON HCL 4 MG/2ML IJ SOLN: 4.0000 mg | Freq: Four times a day (QID) | INTRAMUSCULAR | Status: DC | PRN

## 2012-04-05 MED ORDER — ALPRAZOLAM 0.25 MG PO TABS: 0.2500 mg | ORAL_TABLET | Freq: Two times a day (BID) | ORAL | Status: DC | PRN

## 2012-04-05 MED ORDER — VITAMIN B-12 100 MCG PO TABS
100.0000 ug | ORAL_TABLET | Freq: Every day | ORAL | Status: DC
  Administered 2012-04-06 – 2012-04-08 (×3): 100 ug via ORAL
  Filled 2012-04-05 (×4): qty 1

## 2012-04-05 MED ORDER — ADULT MULTIVITAMIN W/MINERALS CH
1.0000 | ORAL_TABLET | Freq: Every day | ORAL | Status: DC
  Administered 2012-04-06 – 2012-04-08 (×3): 1 via ORAL
  Filled 2012-04-05 (×4): qty 1

## 2012-04-05 MED ORDER — SODIUM CHLORIDE 0.9 % IJ SOLN: 3.0000 mL | INTRAMUSCULAR | Status: DC | PRN

## 2012-04-05 MED ORDER — DOFETILIDE 500 MCG PO CAPS
500.0000 ug | ORAL_CAPSULE | Freq: Two times a day (BID) | ORAL | Status: DC
  Administered 2012-04-05: 500 ug via ORAL
  Filled 2012-04-05 (×3): qty 1

## 2012-04-05 MED ORDER — ZOLPIDEM TARTRATE 5 MG PO TABS: 5.0000 mg | ORAL_TABLET | Freq: Every evening | ORAL | Status: DC | PRN

## 2012-04-05 NOTE — H&P (Signed)
Patient ID: SIG CLISHAM MRN: [id number], DOB/AGE: Oct 14, 1965   Admit date: 04/05/2012   Primary Physician: [name], MD Primary Cardiologist: J. Hochrein, MD   Pt. Profile:  46 y/o male with h/o Afib who presents for tikosyn loading.  Problem List  Past Medical History  Diagnosis Date  . External thrombosed hemorrhoids   . Chest pain   . Osteoarthrosis, unspecified whether generalized or localized, ankle and foot     of the knees,ankle and foot  . Guillain-Barre syndrome   . Osteoarthrosis, unspecified whether generalized or localized, lower leg   . Hypersomnia with sleep apnea, unspecified   . Cervicalgia   . Vitamin B 12 deficiency   . Reflux esophagitis   . Urticaria, unspecified   . Sinoatrial node dysfunction   . Morbid obesity   . Hernia of unspecified site of abdominal cavity without mention of obstruction or gangrene     of abdominal  . Unspecified intestinal malabsorption   . Pancytopenia   . Hypertension   . Sleep apnea     CPAP  . GERD (gastroesophageal reflux disease)   . Atrial fibrillation     a. Pradaxa started 01/2012;  b .s/p TEE/DCCV 02/16/2012; c. recurrent afib->Tikosyn Initiation 03/2012  . Nonischemic cardiomyopathy     a. 01/2012 Echo: EF 35-40%, Diff HK, mild MR;  b. 01/2012 R&L Heart Cath: mildly elevated R heart pressures (pcwp 17) , nl cors.  . Chronic systolic CHF (congestive heart failure)     a. 01/2012 Echo: EF 35-40%, Diff HK, mild MR  . Hematuria     a. 01/2012 - to f/u with urology as outpt.    Past Surgical History  Procedure Date  . Gastric bypass 2004  . Umbilical hernia repair     x 2  . Hernia repair   . Tee without cardioversion 02/16/2012    Procedure: TRANSESOPHAGEAL ECHOCARDIOGRAM (TEE);  Surgeon: [name], MD;  Location: [facility] ENDOSCOPY;  Service: Cardiovascular;  Laterality: N/A;  10a CV  . Cardioversion 02/16/2012    Procedure: CARDIOVERSION;  Surgeon: [name], MD;  Location: [facility] ENDOSCOPY;  Service:  Cardiovascular;  Laterality: N/A;     Allergies  No Known Allergies  HPI  46 y/o male with the above problem list.  He is s/p TEE/DCCV earlier this year but recently has noted increasing fatigue and irregular heart rhythm.  He was seen in clinic on 9/4 and found to be back in afib.  Decision was made to pursue elective tikosyn loading.  Home Medications  Prior to Admission medications   Medication Sig Start Date End Date Taking? Authorizing Provider  Calcium Citrate (CITRACAL PO) Take by mouth daily. tums as needed    Historical Provider, MD  Cyanocobalamin (VITAMIN B12 PO) Take 1 tablet by mouth daily.     Historical Provider, MD  dabigatran (PRADAXA) 150 MG CAPS Take 1 capsule (150 mg total) by mouth every 12 (twelve) hours. 02/16/12   Ok Anis, NP  furosemide (LASIX) 40 MG tablet Take 1 tablet (40 mg total) by mouth daily. 02/16/12 02/15/13  Ok Anis, NP  losartan (COZAAR) 50 MG tablet Take 1 tablet (50 mg total) by mouth 2 (two) times daily. 12/26/11   Rollene Rotunda, MD  metoprolol (LOPRESSOR) 25 MG tablet Take 1 tablet (25 mg total) by mouth 2 (two) times daily. 03/07/12 03/07/13  Rollene Rotunda, MD  Multiple Vitamin (MULTIVITAMIN) tablet Take 3 tablets by mouth daily.     Historical Provider, MD  spironolactone (ALDACTONE) 50 MG tablet Take 0.5 tablets (25 mg total) by mouth daily. 02/16/12 02/15/13  Ok Anis, NP  vitamin E (VITAMIN E) 400 UNIT capsule Take 400 Units by mouth daily.    Historical Provider, MD   Family History  Family History  Problem Relation Age of Onset  . Hypertension Mother   . Hypertension Father   . Heart attack Father 24    Died  . Diabetes Father   . Kidney disease Father     with transplant   Social History  History   Social History  . Marital Status: Married    Spouse Name: N/A    Number of Children: 1  . Years of Education: N/A   Occupational History  . Realtor    Social History Main Topics  . Smoking  status: Never Smoker   . Smokeless tobacco: Former Neurosurgeon    Types: Chew    Quit date: 11/11/2002  . Alcohol Use: Yes  . Drug Use: Not on file  . Sexually Active: Not on file   Other Topics Concern  . Not on file   Social History Narrative   Lives with wife.      Review of Systems General:  +++fatigue.  No chills, fever, night sweats or weight changes.  Cardiovascular:  No chest pain, dyspnea on exertion, edema, orthopnea, palpitations, paroxysmal nocturnal dyspnea. Dermatological: No rash, lesions/masses Respiratory: No cough, dyspnea Urologic: No hematuria, dysuria Abdominal:   No nausea, vomiting, diarrhea, bright red blood per rectum, melena, or hematemesis Neurologic:  No visual changes, wkns, changes in mental status. All other systems reviewed and are otherwise negative except as noted above.  Physical Exam  There were no vitals taken for this visit.  General: Pleasant, NAD Psych: Normal affect. Neuro: Alert and oriented X 3. Moves all extremities spontaneously. HEENT: Normal  Neck: Supple without bruits or JVD. Lungs:  Resp regular and unlabored, CTA. Heart: RRR no s3, s4, or murmurs. Abdomen: Soft, non-tender, non-distended, BS + x 4.  Extremities: No clubbing, cyanosis or edema. DP/PT/Radials 2+ and equal bilaterally.  Labs    Lab 04/05/12 0938  NA 141  K 4.1  CL 110  CO2 26  BUN 10  CREATININE 0.9  CALCIUM 8.5  PROT --  BILITOT --  ALKPHOS --  ALT --  AST --  GLUCOSE 95   Mg 1.9  Radiology/Studies  No results found.  ECG  Afib, 81, QTc 417  ASSESSMENT AND PLAN  1.  Afib:  Pt presents for tikosyn loading in the setting of recurrent afib.  Labs checked earlier today in the office show acceptable K+ and Mg.  Cr clearance allows for 500 mcg q 12h.  QTc=417.  Cont pradaxa.  If he doesn't convert by Monday, plan repeat DCCV.  2.  Chronic systolic CHF/NICM:  Volume stable.  Cont bb, arb, spiro, lasix.  Consider switching short acting metoprolol  to toprol xl.  Signed, Nicolasa Ducking, NP 04/05/2012, 4:38 PM I have taken a history, reviewed medications, allergies, PMH, SH, FH, and reviewed ROS and examined the patient.  I agree with the assessment and plan.  Lovie Agresta C. Daleen Squibb, MD, [facility] Pager:  [phone]

## 2012-04-05 NOTE — Assessment & Plan Note (Signed)
Reviewed pt's labs. K- 4.1 and Mg- 1.9, both of which are at goal to start Tikosyn. QTc acceptable at 430. SCr- 0.9 and CrCl- >120 mL/min. Will start patient at twice daily. Pt aware to report to hospital for admission.

## 2012-04-05 NOTE — Progress Notes (Signed)
Tyrone [name] is a 46 yo M with Afib presenting today for initiation of Tikosyn. Patient was recently hospitalized for Afib where he was cardioverted and found to be back in Afib at f/u. Pt has been experiencing significant fatigue and bradycardia on rate control and when seen by Dr. Antoine Poche on 9/04 when he decision to start Tikosyn was made.  Reviewed pt's medication list. Pt is not taking any contraindicated medications or QTc prolongating medications. He is appropriately anticoagulated with Pradaxa and reports compliance with this medication. He has never taken any antiarrhythmic medication. He is on metoprolol for rate control. Pts QTc was 441 on EKG, but Dr. [name] calculated it to be 430 and gave the go ahead.  Reviewed potential side effects with medication and importance of compliance with patient. He is aware to take this medication twice a day and call if he misses more than 2 doses in a row.   EKG- reviewed by Dr.Klein . Afib. QTc- 430 K+ 4.1, Mg 1.9   No Known Allergies  Current Outpatient Prescriptions  Medication Sig Dispense Refill  . Calcium Citrate (CITRACAL PO) Take by mouth daily. tums as needed      . Cyanocobalamin (VITAMIN B12 PO) Take 1 tablet by mouth daily.       . dabigatran (PRADAXA) 150 MG CAPS Take 1 capsule (150 mg total) by mouth every 12 (twelve) hours.  60 capsule  6  . furosemide (LASIX) 40 MG tablet Take 1 tablet (40 mg total) by mouth daily.  30 tablet  6  . losartan (COZAAR) 50 MG tablet Take 1 tablet (50 mg total) by mouth 2 (two) times daily.  60 tablet  6  . metoprolol (LOPRESSOR) 25 MG tablet Take 1 tablet (25 mg total) by mouth 2 (two) times daily.  60 tablet  6  . Multiple Vitamin (MULTIVITAMIN) tablet Take 3 tablets by mouth daily.       Marland Kitchen spironolactone (ALDACTONE) 50 MG tablet Take 0.5 tablets (25 mg total) by mouth daily.  30 tablet  6  . vitamin E (VITAMIN E) 400 UNIT capsule Take 400 Units by mouth daily.

## 2012-04-05 NOTE — Progress Notes (Signed)
Pharmacy Consult for Dofetilide (Tikosyn) Iniation  Admit Complaint: 46 y.o. male admitted 04/05/2012 with atrial fibrillation to be initiated on dofetilide.   Assessment:  Patient Exclusion Criteria: If any screening criteria checked as "Yes", then  patient  should NOT receive dofetilide until criteria item is corrected. If "Yes" please indicate correction plan.  YES  NO Patient  Exclusion Criteria Correction Plan  []  [x]  Baseline QTc interval is greater than or equal to 440 msec. IF above YES box checked dofetilide contraindicated unless patient has ICD; then may proceed if QTc 500-550 msec or with known ventricular conduction abnormalities may proceed with QTc 550-600 msec. QTc =     []  [x]  Magnesium level is less than 1.8 mEq/l : Last magnesium:  Lab Results  Component Value Date   MG 1.9 04/05/2012          [x]  Potassium level is less than 4 mEq/l : Last potassium:  Lab Results  Component Value Date   K 4.1 04/05/2012         []  [x]  Patient is known or suspected to have a digoxin level greater than 2 ng/ml: No results found for this basename: DIGOXIN      []  [x]  Creatinine clearance less than 20 ml/min (calculated usin[] g Cockcroft-Gault, actual body weight and serum creatinine): Estimated Creatinine Clearance: 117 ml/min (by C-G formula based on Cr of 0.9).    []  [x]  Patient has received drugs known to prolong the QT intervals within the last 48 hours(phenothiazines, tricyclics or tetracyclic antidepressants, erythromycin, H-1 antihistamines, cisapride, fluoroquinolones, azithromycin).   []  [x]  Patient received a dose of hydrochlorothiazide (Oretic) alone or in any combination including triamterene (Dyazide, Maxzide) in the last 48 hours.   []  [x]  Patient received a medication known to increase dofetilide plasma concentrations prior to initial dofetilide dose:    Trimethoprim (Primsol, Proloprim) in the last 36 hours   Verapamil (Calan, Verelan) in the last 36 hours or a  sustained release dose in the last 72 hours   Megestrol (Megace) in the last 5 days    Cimetidine (Tagamet) in the last 6 hours   Ketoconazole (Nizoral) in the last 24 hours   Itraconazole (Sporanox) in the last 48 hours    Prochlorperazine (Compazine) in the last 36 hours    []  [x]  Patient is known to have a history of torsades de pointes; congenital or acquired long QT syndromes.   []  [x]  Patient has received a Class 1 antiarrhythmic with less than 2 half-lives since last dose. (Disopyramide, Quinidine, Procainamide, Lidocaine, Mexiletine, Flecainide, Propafenone)   []  [x]  Patient has received amiodarone therapy in the past 3 months or amiodarone level is greater than 0.3 ng/ml.    Patient has been appropriately anticoagulated with dabigaran.  Ordering provider was confirmed at [email or web address] if they are not listed on the [facility] Authorized Prescribers list.  Goal of Therapy:  Follow renal function, electrolytes, potential drug interactions, and dose adjustment. Provide education and 1 week supply at discharge.  Plan:  1.  Initiate dofetilide based on renal function: Select One Calculated CrCl  Dose q12h  [x]  > 60 ml/min 500 mcg  []  40-60 ml/min 250 mcg  []  20-40 ml/min 125 mcg  2. Follow up QTc after the first 5 doses, renal function, electrolytes (K & Mg) daily x 3 days, dose adjustment, success of initiation and facilitate 1 week discharge supply as clinically indicated.  3. Initiate Tikosyn education video (Call [phone] and ask for video # 116).  4. Place  Enrollment Form on the chart for discharge supply of dofetilide.   Thank you for allowing pharmacy to be a part of this patients care team.  Lovenia Kim Pharm.D., BCPS Clinical Pharmacist 04/05/2012 5:06 PM Pager: [phone] Phone: [phone]

## 2012-04-06 ENCOUNTER — Encounter (HOSPITAL_COMMUNITY): Payer: Self-pay | Admitting: Internal Medicine

## 2012-04-06 DIAGNOSIS — E876 Hypokalemia: Secondary | ICD-10-CM

## 2012-04-06 DIAGNOSIS — I495 Sick sinus syndrome: Secondary | ICD-10-CM | POA: Insufficient documentation

## 2012-04-06 LAB — CBC
HCT: 34.1 % — ABNORMAL LOW (ref 39.0–52.0)
Hemoglobin: 11.7 g/dL — ABNORMAL LOW (ref 13.0–17.0)
MCH: 30.5 pg (ref 26.0–34.0)
MCHC: 34.3 g/dL (ref 30.0–36.0)
MCV: 88.8 fL (ref 78.0–100.0)
Platelets: 141 10*3/uL — ABNORMAL LOW (ref 150–400)
RBC: 3.84 MIL/uL — ABNORMAL LOW (ref 4.22–5.81)
RDW: 12.8 % (ref 11.5–15.5)
WBC: 5.4 10*3/uL (ref 4.0–10.5)

## 2012-04-06 LAB — BASIC METABOLIC PANEL
BUN: 12 mg/dL (ref 6–23)
CO2: 27 mEq/L (ref 19–32)
Calcium: 8.7 mg/dL (ref 8.4–10.5)
Chloride: 104 mEq/L (ref 96–112)
Creatinine, Ser: 0.75 mg/dL (ref 0.50–1.35)
GFR calc Af Amer: >90 mL/min (ref >90)
GFR calc non Af Amer: >90 mL/min (ref >90)
Glucose, Bld: 95 mg/dL (ref 70–99)
Potassium: 3.3 mEq/L — ABNORMAL LOW (ref 3.5–5.1)
Sodium: 140 mEq/L (ref 135–145)

## 2012-04-06 LAB — MAGNESIUM: Magnesium: 2 mg/dL (ref 1.5–2.5)

## 2012-04-06 MED ORDER — POTASSIUM CHLORIDE CRYS ER 20 MEQ PO TBCR
40.0000 meq | EXTENDED_RELEASE_TABLET | ORAL | Status: AC
  Administered 2012-04-06 (×3): 40 meq via ORAL
  Filled 2012-04-06 (×3): qty 2

## 2012-04-06 NOTE — Progress Notes (Signed)
Pt Qtc 523 after Tikosyn load, MD on call notified, no orders given, but to pass on to oncoming nurse to page MD this am to make decision on next dose. Will continue to monitor.

## 2012-04-06 NOTE — Progress Notes (Signed)
   CARE MANAGEMENT NOTE 04/06/2012  Patient:  Tyrone [name], Tyrone [name]   Account Number:  [id number]  Date Initiated:  04/06/2012  Documentation initiated by:  [facility]  Subjective/Objective Assessment:   afib     Action/Plan:   Tikosyn Loading   Anticipated DC Date:  04/07/2012   Anticipated DC Plan:  HOME/SELF CARE      DC Planning Services  CM consult  Medication Assistance      Choice offered to / List presented to:             Status of service:  Completed, signed off Medicare Important Message given?   (If response is "NO", the following Medicare IM given date fields will be blank) Date Medicare IM given:   Date Additional Medicare IM given:    Discharge Disposition:  HOME/SELF CARE  Per UR Regulation:    If discussed at Long Length of Stay Meetings, dates discussed:    Comments:  04/06/2012 1015 NCM spoke to pt and states Tikosyn did not work well with him. They are going to discontinue and start Amiodarone. Pt has drug coverage with his BCBS.  Isidoro Donning RN CCM Case Mgmt phone [phone]

## 2012-04-06 NOTE — Progress Notes (Signed)
Pt Qtc 487 prior to loading dose of tikosyn. MD on call notified and said it was ok to give. Will continue to monitor and do EKG in 3 hrs.

## 2012-04-06 NOTE — Progress Notes (Signed)
h Patient Name: Tyrone [name]     SUBJECTIVE:admitted for tikkosyn loading; had QTc of 445 and JTc of 430 ydaty Has NICM with EF 35%  We have reviewed 2 electrocardiograms in the office and as listed above the results were equivocal. Electrocardiogram obtained yesterday afternoon the QTC was considerably longer. QTC this morning is much longer. This occurs in the context of hypokalemia.  Past Medical History  Diagnosis Date  . External thrombosed hemorrhoids   . Chest pain   . Osteoarthrosis, unspecified whether generalized or localized, ankle and foot     of the knees,ankle and foot  . Guillain-Barre syndrome   . Osteoarthrosis, unspecified whether generalized or localized, lower leg   . Hypersomnia with sleep apnea, unspecified   . Cervicalgia   . Vitamin B 12 deficiency   . Reflux esophagitis   . Urticaria, unspecified   . Sinoatrial node dysfunction   . Morbid obesity   . Hernia of unspecified site of abdominal cavity without mention of obstruction or gangrene     of abdominal  . Unspecified intestinal malabsorption   . Pancytopenia   . Hypertension   . Sleep apnea     CPAP  . GERD (gastroesophageal reflux disease)   . Atrial fibrillation     a. Pradaxa started 01/2012;  b .s/p TEE/DCCV 02/16/2012; c. recurrent afib->Tikosyn Initiation 03/2012  . Nonischemic cardiomyopathy     a. 01/2012 Echo: EF 35-40%, Diff HK, mild MR;  b. 01/2012 R&L Heart Cath: mildly elevated R heart pressures (pcwp 17) , nl cors.  . Chronic systolic CHF (congestive heart failure)     a. 01/2012 Echo: EF 35-40%, Diff HK, mild MR  . Hematuria     a. 01/2012 - to f/u with urology as outpt.  . Shortness of breath   . Heart murmur     PHYSICAL EXAM Filed Vitals:   04/05/12 1653 04/05/12 1946 04/06/12 0432  BP: 130/79 128/92 131/96  Pulse: 73 65 100  Temp: 97.6 F (36.4 C) 97.9 F (36.6 C) 98.1 F (36.7 C)  TempSrc: Oral Oral Oral  Resp: 18 18 19   Height: 5\' 6"  (1.676 m)    Weight: 238 lb 1.6 oz  (108 kg)  240 lb 12.8 oz (109.226 kg)  SpO2: 98% 97% 96%    Well developed and nourished in no acute distress HENT normal Neck supple with JVP-flat Carotids brisk and full without bruits Clear Irregularly irregular rate and rhythm with rapid  ventricular response, no murmurs or gallops Abd-soft with active BS without hepatomegaly No Clubbing cyanosis edema Skin-warm and dry A & Oriented  Grossly normal sensory and motor function  TELEMETRY: Reviewed telemetry pt in af RVR:    Intake/Output Summary (Last 24 hours) at 04/06/12 0912 Last data filed at 04/06/12 0300  Gross per 24 hour  Intake    480 ml  Output   1300 ml  Net   -820 ml    LABS: Basic Metabolic Panel:  Lab 04/06/12 2956 04/05/12 0938  NA 140 141  K 3.3* 4.1  CL 104 110  CO2 27 26  GLUCOSE 95 95  BUN 12 10  CREATININE 0.75 0.9  CALCIUM 8.7 8.5  MG 2.0 1.9  PHOS -- --   Cardiac Enzymes: No results found for this basename: CKTOTAL:3,CKMB:3,CKMBINDEX:3,TROPONINI:3 in the last 72 hours CBC:  Lab 04/06/12 0645  WBC 5.4  NEUTROABS --  HGB 11.7*  HCT 34.1*  MCV 88.8  PLT 141*   PROTIME: No results  found for this basename: LABPROT:3,INR:3 in the last 72 hours Liver Function Tests: No results found for this basename: AST:2,ALT:2,ALKPHOS:2,BILITOT:2,PROT:2,ALBUMIN:2 in the last 72 hours No results found for this basename: LIPASE:2,AMYLASE:2 in the last 72 hours BNP: BNP (last 3 results)  Basename 02/11/12 1230  PROBNP 1106.0*      ASSESSMENT AND PLAN:  Patient Active Hospital Problem List: Nonischemic cardiomyopathy ()  ** Atrial fibrillation ()   Hypokalemia (04/06/2012)    The patient has rapid atrial fibrillation. His QTC has been borderline for the use of dofetilide. I think the pre-initiation ECG yesterday and a QTc interval longer and his response to the first dose of dofetilide is also worrisome with a QTC is now over 500 ms. Not withstanding the fact that his potassium is low I  think the risks of proceeding with dofetilide R. too high.  It had a 15-20 minute conversation with the family regarding QT prolongation, antiarrhythmic drug options, the role of catheter ablation. We have reviewed his echo with a left atrial dimension of 41 mm. With his rapid rates in the possibility of tachycardia-induced cardiomyopathy. There has also been however problem sinus bradycardia potentially portends other issues.  We have agreed to the following plan. #1-replete potassium #2 discontinue dofetilide #3-begin amiodarone tomorrow #4-consider rule referral for catheter ablation #5-may well end up needing pacing with sinus bradycardia.  Signed, Sherryl Manges MD  04/06/2012

## 2012-04-07 DIAGNOSIS — I5042 Chronic combined systolic (congestive) and diastolic (congestive) heart failure: Secondary | ICD-10-CM

## 2012-04-07 LAB — BASIC METABOLIC PANEL
BUN: 15 mg/dL (ref 6–23)
CO2: 25 mEq/L (ref 19–32)
Calcium: 8.7 mg/dL (ref 8.4–10.5)
Chloride: 106 mEq/L (ref 96–112)
Creatinine, Ser: 0.83 mg/dL (ref 0.50–1.35)
GFR calc Af Amer: >90 mL/min (ref >90)
GFR calc non Af Amer: >90 mL/min (ref >90)
Glucose, Bld: 89 mg/dL (ref 70–99)
Potassium: 4.1 mEq/L (ref 3.5–5.1)
Sodium: 139 mEq/L (ref 135–145)

## 2012-04-07 LAB — MAGNESIUM: Magnesium: 2 mg/dL (ref 1.5–2.5)

## 2012-04-07 MED ORDER — POTASSIUM CHLORIDE CRYS ER 20 MEQ PO TBCR
40.0000 meq | EXTENDED_RELEASE_TABLET | Freq: Two times a day (BID) | ORAL | Status: AC
  Administered 2012-04-07 – 2012-04-08 (×3): 40 meq via ORAL
  Filled 2012-04-07: qty 1
  Filled 2012-04-07 (×2): qty 2
  Filled 2012-04-07: qty 1

## 2012-04-07 MED ORDER — AMIODARONE HCL 200 MG PO TABS
400.0000 mg | ORAL_TABLET | Freq: Three times a day (TID) | ORAL | Status: DC
Start: 2012-04-07 — End: 2012-04-08
  Administered 2012-04-07 – 2012-04-08 (×4): 400 mg via ORAL
  Filled 2012-04-07 (×6): qty 2

## 2012-04-07 MED ORDER — FUROSEMIDE 10 MG/ML IJ SOLN
40.0000 mg | Freq: Two times a day (BID) | INTRAMUSCULAR | Status: AC
  Administered 2012-04-07 – 2012-04-08 (×2): 40 mg via INTRAVENOUS
  Filled 2012-04-07 (×2): qty 4

## 2012-04-07 NOTE — Progress Notes (Signed)
h Patient Name: Tyrone [name]     SUBJECTIVE:admitted for tikkosyn loading; had QTc of 445 and JTc of 430 ydaty Has NICM with EF 35%  We have reviewed 2 electrocardiograms in the office and as listed above the results were equivocal. Electrocardiogram obtained yesterday afternoon the QTC was considerably longer. QTC this morning is much longer. This occurs in the context of hypokalemia.  Past Medical History  Diagnosis Date  . External thrombosed hemorrhoids   . Chest pain   . Osteoarthrosis, unspecified whether generalized or localized, ankle and foot     of the knees,ankle and foot  . Guillain-Barre syndrome   . Osteoarthrosis, unspecified whether generalized or localized, lower leg   . Hypersomnia with sleep apnea, unspecified   . Cervicalgia   . Vitamin B 12 deficiency   . Reflux esophagitis   . Urticaria, unspecified   . Sinoatrial node dysfunction   . Morbid obesity   . Hernia of unspecified site of abdominal cavity without mention of obstruction or gangrene     of abdominal  . Unspecified intestinal malabsorption   . Pancytopenia   . Hypertension   . Sleep apnea     CPAP  . GERD (gastroesophageal reflux disease)   . Atrial fibrillation     a. Pradaxa started 01/2012;  b .s/p TEE/DCCV 02/16/2012; c. recurrent afib->Tikosyn Initiation 03/2012  . Nonischemic cardiomyopathy     a. 01/2012 Echo: EF 35-40%, Diff HK, mild MR;  b. 01/2012 R&L Heart Cath: mildly elevated R heart pressures (pcwp 17) , nl cors.  . Chronic systolic CHF (congestive heart failure)     a. 01/2012 Echo: EF 35-40%, Diff HK, mild MR  . Hematuria     a. 01/2012 - to f/u with urology as outpt.    PHYSICAL EXAM Filed Vitals:   04/05/12 1946 04/06/12 0432 04/06/12 1420 04/07/12 0438  BP: 128/92 131/96 110/75 108/88  Pulse: 65 100 67 88  Temp: 97.9 F (36.6 C) 98.1 F (36.7 C) 98.1 F (36.7 C) 98 F (36.7 C)  TempSrc: Oral Oral Oral Oral  Resp: 18 19 18 20   Height:      Weight:  240 lb 12.8 oz (109.226  kg)  238 lb 1.6 oz (108.001 kg)  SpO2: 97% 96% 97% 97%    Well developed and nourished in no acute distress HENT normal Neck supple with [id number]  Carotids brisk and full without bruits Clear Irregularly irregular rate and rhythm with rapid  ventricular response, no murmurs or gallops Abd-soft with active BS without hepatomegaly No Clubbing cyanosis tr edema Skin-warm and dry A & Oriented  Grossly normal sensory and motor function  TELEMETRY: Reviewed telemetry pt in af RVR:    Intake/Output Summary (Last 24 hours) at 04/07/12 1014 Last data filed at 04/07/12 0844  Gross per 24 hour  Intake    720 ml  Output   3825 ml  Net  -3105 ml    LABS: Basic Metabolic Panel:  Lab 04/07/12 8295 04/06/12 0645 04/05/12 0938  NA 139 140 141  K 4.1 3.3* 4.1  CL 106 104 110  CO2 25 27 26   GLUCOSE 89 95 95  BUN 15 12 10   CREATININE 0.83 0.75 0.9  CALCIUM 8.7 8.7 --  MG 2.0 2.0 --  PHOS -- -- --   Cardiac Enzymes: No results found for this basename: CKTOTAL:3,CKMB:3,CKMBINDEX:3,TROPONINI:3 in the last 72 hours CBC:  Lab 04/06/12 0645  WBC 5.4  NEUTROABS --  HGB 11.7*  HCT  34.1*  MCV 88.8  PLT 141*   BNP (last 3 results)  Basename 02/11/12 1230  PROBNP 1106.0*      ASSESSMENT AND PLAN:  Patient Active Hospital Problem List: Nonischemic cardiomyopathy ()  * Atrial fibrillation ()   Hypokalemia (04/06/2012)  CHF-chronic/acute 2/2 afib   The patient has rapid atrial fibrillation. His QTC has been borderline for the use of dofetilide. I think the pre-initiation ECG yesterday and a QTc interval longer and his response to the first dose of dofetilide is also worrisome with a QTC is now over 500 ms. Not withstanding the fact that his potassium is low I think the risks of proceeding with dofetilide R. too high.  It had a 15-20 minute conversation with the family regarding QT prolongation, antiarrhythmic drug options, the role of catheter ablation. We have reviewed his  echo with a left atrial dimension of 41 mm. With his rapid rates in the possibility of tachycardia-induced cardiomyopathy. There has also been however problem sinus bradycardia potentially portends other issues.  We have agreed to the following plan. #1-IV diuresis #2 check ECG #3-begin amiodarone   #4-consider rule referral for catheter ablation #5-may well end up needing pacing with sinus bradycardia. #6- anticipate discharge inam  Signed, Sherryl Manges MD  04/07/2012

## 2012-04-08 ENCOUNTER — Encounter (HOSPITAL_COMMUNITY): Payer: Self-pay | Admitting: Internal Medicine

## 2012-04-08 DIAGNOSIS — K2100 Gastro-esophageal reflux disease with esophagitis, without bleeding: Secondary | ICD-10-CM | POA: Insufficient documentation

## 2012-04-08 LAB — BASIC METABOLIC PANEL
BUN: 13 mg/dL (ref 6–23)
CO2: 29 mEq/L (ref 19–32)
Calcium: 8.7 mg/dL (ref 8.4–10.5)
Chloride: 101 mEq/L (ref 96–112)
Creatinine, Ser: 0.98 mg/dL (ref 0.50–1.35)
GFR calc Af Amer: >90 mL/min (ref >90)
GFR calc non Af Amer: >90 mL/min (ref >90)
Glucose, Bld: 97 mg/dL (ref 70–99)
Potassium: 3.7 mEq/L (ref 3.5–5.1)
Sodium: 139 mEq/L (ref 135–145)

## 2012-04-08 LAB — MAGNESIUM: Magnesium: 1.9 mg/dL (ref 1.5–2.5)

## 2012-04-08 MED ORDER — METOPROLOL TARTRATE 25 MG PO TABS: 50.0000 mg | ORAL_TABLET | Freq: Two times a day (BID) | ORAL | Status: DC

## 2012-04-08 MED ORDER — AMIODARONE HCL 400 MG PO TABS: 400.0000 mg | ORAL_TABLET | Freq: Two times a day (BID) | ORAL | Status: DC

## 2012-04-08 MED ORDER — FUROSEMIDE 40 MG PO TABS: 40.0000 mg | ORAL_TABLET | Freq: Two times a day (BID) | ORAL | Status: DC

## 2012-04-08 NOTE — Progress Notes (Signed)
Completed D/C instructions with patient and wife. Prescription given to pt. Patient verbalized understanding. No questions. Patient stable for discharge.

## 2012-04-08 NOTE — Progress Notes (Signed)
h Patient Name: Tyrone [name]     SUBJECTIVE:admitted for tikkosyn loading; had QTc of 445 and JTc of 430 ydaty Has NICM with EF 35%  We  reviewed 2 electrocardiograms in the office and as listed above the results were equivocal. Electrocardiogram obtained friday afternoon the QTC was considerably longer. QTC Sat morning   much longer occuring in the context of hypokalemia. We stopped tikosyn and started amio yesterday which he is tolerating   Heart rates are elevated   Past Medical History  Diagnosis Date  . External thrombosed hemorrhoids   . Chest pain   . Osteoarthrosis, unspecified whether generalized or localized, ankle and foot     of the knees,ankle and foot  . Guillain-Barre syndrome   . Osteoarthrosis, unspecified whether generalized or localized, lower leg   . Hypersomnia with sleep apnea, unspecified   . Cervicalgia   . Vitamin B 12 deficiency   . Reflux esophagitis   . Urticaria, unspecified   . Sinoatrial node dysfunction   . Morbid obesity   . Hernia of unspecified site of abdominal cavity without mention of obstruction or gangrene     of abdominal  . Unspecified intestinal malabsorption   . Pancytopenia   . Hypertension   . Sleep apnea     CPAP  . GERD (gastroesophageal reflux disease)   . Atrial fibrillation     a. Pradaxa started 01/2012;  b .s/p TEE/DCCV 02/16/2012; c. recurrent afib->Tikosyn Initiation 03/2012  . Nonischemic cardiomyopathy     a. 01/2012 Echo: EF 35-40%, Diff HK, mild MR;  b. 01/2012 R&L Heart Cath: mildly elevated R heart pressures (pcwp 17) , nl cors.  . Chronic systolic CHF (congestive heart failure)     a. 01/2012 Echo: EF 35-40%, Diff HK, mild MR  . Hematuria     a. 01/2012 - to f/u with urology as outpt.    PHYSICAL EXAM Filed Vitals:   04/07/12 0438 04/07/12 1350 04/07/12 2039 04/08/12 0443  BP: 108/88 105/64 120/90 109/85  Pulse: 88 78 65 81  Temp: 98 F (36.7 C) 97.6 F (36.4 C) 98.6 F (37 C) 97.4 F (36.3 C)  TempSrc: Oral  Oral Oral Oral  Resp: 20 18 19 18  Height:      Weight: 238 lb 1.6 oz (108.001 kg)   235 lb 14.4 oz (107.004 kg)  SpO2: 97% 100% 98% 100%    Well developed and nourished in no acute distress HENT normal Neck supple with [id number]  Carotids brisk and full without bruits Clear Irregularly irregular rate and rhythm with rapid  ventricular response, no murmurs or gallops Abd-soft with active BS without hepatomegaly No Clubbing cyanosis   edema Skin-warm and dry A & Oriented  Grossly normal sensory and motor function  TELEMETRY: Reviewed telemetry pt in af RVR:    Intake/Output Summary (Last 24 hours) at 04/08/12 0745 Last data filed at 04/08/12 0300  Gross per 24 hour  Intake    480 ml  Output   5275 ml  Net  -4795 ml    LABS: Basic Metabolic Panel:  Lab 04/07/12 0420 04/06/12 0645 04/05/12 0938  NA 139 140 141  K 4.1 3.3* 4.1  CL 106 104 110  CO2 25 27 26  GLUCOSE 89 95 95  BUN 15 12 10  CREATININE 0.83 0.75 0.9  CALCIUM 8.7 8.7 --  MG 2.0 2.0 --  PHOS -- -- --   Cardiac Enzymes: No results found for this basename: CKTOTAL:3,CKMB:3,CKMBINDEX:3,TROPONINI:3 in the   last 72 hours CBC:  Lab 04/06/12 0645  WBC 5.4  NEUTROABS --  HGB 11.7*  HCT 34.1*  MCV 88.8  PLT 141*   BNP (last 3 results)  Basename 02/11/12 1230  PROBNP 1106.0*      ASSESSMENT AND PLAN:  Patient Active Hospital Problem List: Nonischemic cardiomyopathy ()  * Atrial fibrillation ()   Hypokalemia (04/06/2012)  CHF-chronic/acute 2/2 afib   The patient has rapid atrial fibrillation. His QTC has been borderline for the use of dofetilide. I think the pre-initiation ECG yesterday and a QTc interval longer and his response to the first dose of dofetilide is also worrisome with a QTC is now over 500 ms. Not withstanding the fact that his potassium is low I think the risks of proceeding with dofetilide R. too high.  It had a 15-20 minute conversation with the family regarding QT  prolongation, antiarrhythmic drug options, the role of catheter ablation. We have reviewed his echo with a left atrial dimension of 41 mm. With his rapid rates in the possibility of tachycardia-induced cardiomyopathy. There has also been however problem sinus bradycardia potentially portends other issues.  Fluid overload is beter Will discharge on 40  bid lasix, amio 400 bid x 14 days outpt DCCV in 14 days wih Ryliee Figge or [facility] Increase lopressor to 50 bid and stop 3 days before cardioversion   Signed, Kenzee Bassin MD  04/08/2012   

## 2012-04-08 NOTE — Discharge Summary (Signed)
ELECTROPHYSIOLOGY DISCHARGE SUMMARY    Patient ID: Tyrone [name],  MRN: [id number], DOB/AGE: November 09, 1965 46 y.o.  Admit date: 04/05/2012 Discharge date: 04/08/2012  Primary Care Physician: [name], MD Primary Cardiologist: Antoine Poche, MD Primary EP: [name], MD  Primary Discharge Diagnosis:  1. Atrial fibrillation 2. Sinus node dysfunction  Secondary Discharge Diagnoses:  1. Nonischemic CM, EF 35-40% 2. Chronic systolic CHF 3. HTN 4. GERD 5. OSA 6. Osteoarthritis 5. Obesity   Procedures This Admission:  None  History and Hospital Course:  Tyrone [name] is a 46 year old gentleman with a NICM, chronic systolic HF and atrial fibrillation who underwent TEE-guided DCCV earlier this year but recently has had increasing fatigue and palpitations. He was evaluated in clinic on 03/27/2012 and found to be back in AFib. Treatment strategies were reviewed and he elected Tikosyn drug loading. He was admitted on 04/05/2012 for Tikosyn initiation. However, after his first dose he developed QT prolongation and this was discontinued. He started amiodarone and is tolerating this well so far. His rates remain elevated and his metoprolol dose was increased.   Please see Dr. Odessa Fleming recommendations as follows: "The patient has rapid atrial fibrillation. His QTC has been borderline for the use of dofetilide. I think the pre-initiation ECG yesterday and a QTc interval longer and his response to the first dose of dofetilide is also worrisome with a QTC is now over 500 ms. Not withstanding the fact that his potassium is low I think the risks of proceeding with dofetilide R. too high. I had a 15-20 minute conversation with the family regarding QT prolongation, antiarrhythmic drug options, the role of catheter ablation. We have reviewed his echo with a left atrial dimension of 41 mm. With his rapid rates in the possibility of tachycardia-induced cardiomyopathy. There has also been however problem sinus bradycardia  potentially portends other issues. Fluid overload is better. Will discharge on 40 bid lasix, amio 400 bid x 14 days. Outpt DCCV in 14 days wih [name] or [facility]. Increase lopressor to 50 bid and stop 3 days before cardioversion." He has been seen, examined and deemed stable for discharge today by Dr. Sherryl Manges.   Discharge Vitals: Blood pressure 109/85, pulse 81, temperature 97.4 F (36.3 C), temperature source Oral, resp. rate 18, height 5\' 6"  (1.676 m), weight 235 lb 14.4 oz (107.004 kg), SpO2 100.00%.   Labs: Lab Results  Component Value Date   WBC 5.4 04/06/2012   HGB 11.7* 04/06/2012   HCT 34.1* 04/06/2012   MCV 88.8 04/06/2012   PLT 141* 04/06/2012    Lab 04/08/12 0700  NA 139  K 3.7  CL 101  CO2 29  BUN 13  CREATININE 0.98  CALCIUM 8.7  PROT --  BILITOT --  ALKPHOS --  ALT --  AST --  GLUCOSE 97    Disposition:  The patient is being discharged in stable condition.  Follow-up: Follow-up Information    Follow up with Sherryl Manges, MD. (For cardioversion in ~14 days. Our office will call you to schecule.)    Contact information:   [address] [place] [address] [phone]   Discharge Medications:    Medication List     As of 04/08/2012  1:14 PM    TAKE these medications         amiodarone 400 MG tablet   Commonly known as: PACERONE   Take 1 tablet (400 mg total) by mouth 2 (two) times daily. Take 1 tablet (400 mg)  twice daily for 14 days, then take 1 tablet (400 mg) once daily thereafter.      calcium citrate 950 MG tablet   Commonly known as: CALCITRATE - dosed in mg elemental calcium   Take 1 tablet by mouth daily.      dabigatran 150 MG Caps   Commonly known as: PRADAXA   Take 1 capsule (150 mg total) by mouth every 12 (twelve) hours.      furosemide 40 MG tablet   Commonly known as: LASIX   Take 1 tablet (40 mg total) by mouth 2 (two) times daily.      losartan 50 MG tablet   Commonly known as: COZAAR   Take 1 tablet (50 mg  total) by mouth 2 (two) times daily.      metoprolol tartrate 25 MG tablet   Commonly known as: LOPRESSOR   Take 2 tablets (50 mg total) by mouth 2 (two) times daily.      multivitamin tablet   Take 3 tablets by mouth daily.      spironolactone 50 MG tablet   Commonly known as: ALDACTONE   Take 0.5 tablets (25 mg total) by mouth daily.      VITAMIN B12 PO   Take 1 tablet by mouth daily.      vitamin E 400 UNIT capsule   Generic drug: vitamin E   Take 400 Units by mouth daily.      Duration of Discharge Encounter: Greater than 30 minutes including physician time.  Signed, Rick Duff, PA-C 04/08/2012, 1:14 PM

## 2012-04-11 ENCOUNTER — Ambulatory Visit: Payer: BC Managed Care – PPO | Admitting: Cardiology

## 2012-04-15 ENCOUNTER — Encounter: Payer: Self-pay | Admitting: *Deleted

## 2012-04-15 ENCOUNTER — Telehealth: Payer: Self-pay | Admitting: Internal Medicine

## 2012-04-15 NOTE — Telephone Encounter (Signed)
I spoke with the patient's wife and gave her instructions for DCCV for 04/22/12 with Dr. [name].

## 2012-04-15 NOTE — Telephone Encounter (Signed)
Pt spouse would like to know when will the patient be set up for the cardioversion

## 2012-04-15 NOTE — Telephone Encounter (Signed)
Per the patient's wife, the patient would like to be scheduled for 9/30 with Dr. [name]. I have set this up for the patient for 9/30 at 2:00 pm with Dr. [name]. I have left a message for the patient's wife to call me back.

## 2012-04-15 NOTE — Telephone Encounter (Signed)
I called and spoke with the patient's wife. I gave her options for 9/27 with Dr. Antoine Poche or 9/30 with Dr. [name] as potential dates for DCCV. She will discuss with the patient and call me back.

## 2012-04-17 ENCOUNTER — Telehealth: Payer: Self-pay | Admitting: *Deleted

## 2012-04-17 NOTE — Telephone Encounter (Signed)
I received a call from the patient's wife, [name], stating they are scheduled to leave for a cruise a month from tomorrow. They are wanting to get a letter from Dr. [name] stating that the patient should not leave the country at this time due to his cardiac condition. He is scheduled for DCCV on 9/30 with Dr. [name], but he feels very uncomfortable leaving the country until things are more stable with his heart. Per the patient's wife, he was DCCV before and it lasted only a few weeks. I advised I will forward the message to Dr. [name] for a letter to be done. I will also forward to [facility], LPN who will be covering Dr. [name] in my abscence. The patient's wife is agreeable. She would like to be called at [phone] when the letter is done.

## 2012-04-22 ENCOUNTER — Encounter (HOSPITAL_COMMUNITY): Payer: Self-pay | Admitting: *Deleted

## 2012-04-22 ENCOUNTER — Encounter (HOSPITAL_COMMUNITY)
Admission: RE | Disposition: A | Discharge status: Home / Self Care | Payer: Self-pay | Source: Ambulatory Visit | Attending: Internal Medicine

## 2012-04-22 ENCOUNTER — Ambulatory Visit (HOSPITAL_COMMUNITY)
Admission: RE | Admit: 2012-04-22 | Discharge: 2012-04-22 | Disposition: A | Discharge status: Home / Self Care | Payer: BC Managed Care – PPO | Source: Ambulatory Visit | Attending: Internal Medicine | Admitting: Internal Medicine

## 2012-04-22 ENCOUNTER — Ambulatory Visit (HOSPITAL_COMMUNITY): Payer: BC Managed Care – PPO | Admitting: Anesthesiology

## 2012-04-22 ENCOUNTER — Encounter (HOSPITAL_COMMUNITY): Payer: Self-pay | Admitting: Anesthesiology

## 2012-04-22 DIAGNOSIS — I4891 Unspecified atrial fibrillation: Secondary | ICD-10-CM

## 2012-04-22 HISTORY — PX: CARDIOVERSION: SHX1299

## 2012-04-22 SURGERY — CARDIOVERSION
Anesthesia: Moderate Sedation

## 2012-04-22 SURGERY — CARDIOVERSION
Anesthesia: Monitor Anesthesia Care | Wound class: Clean

## 2012-04-22 MED ORDER — SODIUM CHLORIDE 0.9 % IV SOLN: INTRAVENOUS | Status: DC

## 2012-04-22 MED ORDER — PROPOFOL 10 MG/ML IV BOLUS
INTRAVENOUS | Status: DC | PRN
  Administered 2012-04-22: 100 mg via INTRAVENOUS

## 2012-04-22 MED ORDER — SODIUM CHLORIDE 0.9 % IJ SOLN: 3.0000 mL | Freq: Two times a day (BID) | INTRAMUSCULAR | Status: DC

## 2012-04-22 MED ORDER — SODIUM CHLORIDE 0.9 % IV SOLN
INTRAVENOUS | Status: DC | PRN
  Administered 2012-04-22: 14:00:00 via INTRAVENOUS

## 2012-04-22 MED ORDER — SODIUM CHLORIDE 0.9 % IJ SOLN: 3.0000 mL | INTRAMUSCULAR | Status: DC | PRN

## 2012-04-22 MED ORDER — SODIUM CHLORIDE 0.9 % IV SOLN: 250.0000 mL | INTRAVENOUS | Status: DC

## 2012-04-22 MED ORDER — LIDOCAINE HCL (CARDIAC) 20 MG/ML IV SOLN
INTRAVENOUS | Status: DC | PRN
  Administered 2012-04-22: 100 mg via INTRAVENOUS

## 2012-04-22 NOTE — Preoperative (Signed)
Beta Blockers   Reason not to administer Beta Blockers:Not Applicable 

## 2012-04-22 NOTE — Transfer of Care (Signed)
Immediate Anesthesia Transfer of Care Note  Patient: Tyrone [name]  Procedure(s) Performed: Procedure(s) (LRB) with comments: CARDIOVERSION (N/A)  Patient Location: PACU  Anesthesia Type: MAC  Level of Consciousness: awake, alert  and oriented  Airway & Oxygen Therapy: Patient Spontanous Breathing and Patient connected to nasal cannula oxygen  Post-op Assessment: Report given to PACU RN, Post -op Vital signs reviewed and stable and Patient moving all extremities X 4  Post vital signs: Reviewed and stable  Complications: No apparent anesthesia complications

## 2012-04-22 NOTE — H&P (View-Only) (Signed)
h Patient Name: Tyrone [name]     SUBJECTIVE:admitted for tikkosyn loading; had QTc of 445 and JTc of 430 ydaty Has NICM with EF 35%  We  reviewed 2 electrocardiograms in the office and as listed above the results were equivocal. Electrocardiogram obtained friday afternoon the QTC was considerably longer. QTC Sat morning   much longer occuring in the context of hypokalemia. We stopped tikosyn and started amio yesterday which he is tolerating   Heart rates are elevated   Past Medical History  Diagnosis Date  . External thrombosed hemorrhoids   . Chest pain   . Osteoarthrosis, unspecified whether generalized or localized, ankle and foot     of the knees,ankle and foot  . Guillain-Barre syndrome   . Osteoarthrosis, unspecified whether generalized or localized, lower leg   . Hypersomnia with sleep apnea, unspecified   . Cervicalgia   . Vitamin B 12 deficiency   . Reflux esophagitis   . Urticaria, unspecified   . Sinoatrial node dysfunction   . Morbid obesity   . Hernia of unspecified site of abdominal cavity without mention of obstruction or gangrene     of abdominal  . Unspecified intestinal malabsorption   . Pancytopenia   . Hypertension   . Sleep apnea     CPAP  . GERD (gastroesophageal reflux disease)   . Atrial fibrillation     a. Pradaxa started 01/2012;  b .s/p TEE/DCCV 02/16/2012; c. recurrent afib->Tikosyn Initiation 03/2012  . Nonischemic cardiomyopathy     a. 01/2012 Echo: EF 35-40%, Diff HK, mild MR;  b. 01/2012 R&L Heart Cath: mildly elevated R heart pressures (pcwp 17) , nl cors.  . Chronic systolic CHF (congestive heart failure)     a. 01/2012 Echo: EF 35-40%, Diff HK, mild MR  . Hematuria     a. 01/2012 - to f/u with urology as outpt.    PHYSICAL EXAM Filed Vitals:   04/07/12 0438 04/07/12 1350 04/07/12 2039 04/08/12 0443  BP: 108/88 105/64 120/90 109/85  Pulse: 88 78 65 81  Temp: 98 F (36.7 C) 97.6 F (36.4 C) 98.6 F (37 C) 97.4 F (36.3 C)  TempSrc: Oral  Oral Oral Oral  Resp: 20 18 19 18   Height:      Weight: 238 lb 1.6 oz (108.001 kg)   235 lb 14.4 oz (107.004 kg)  SpO2: 97% 100% 98% 100%    Well developed and nourished in no acute distress HENT normal Neck supple with [id number]  Carotids brisk and full without bruits Clear Irregularly irregular rate and rhythm with rapid  ventricular response, no murmurs or gallops Abd-soft with active BS without hepatomegaly No Clubbing cyanosis   edema Skin-warm and dry A & Oriented  Grossly normal sensory and motor function  TELEMETRY: Reviewed telemetry pt in af RVR:    Intake/Output Summary (Last 24 hours) at 04/08/12 0745 Last data filed at 04/08/12 0300  Gross per 24 hour  Intake    480 ml  Output   5275 ml  Net  -4795 ml    LABS: Basic Metabolic Panel:  Lab 04/07/12 4098 04/06/12 0645 04/05/12 0938  NA 139 140 141  K 4.1 3.3* 4.1  CL 106 104 110  CO2 25 27 26   GLUCOSE 89 95 95  BUN 15 12 10   CREATININE 0.83 0.75 0.9  CALCIUM 8.7 8.7 --  MG 2.0 2.0 --  PHOS -- -- --   Cardiac Enzymes: No results found for this basename: CKTOTAL:3,CKMB:3,CKMBINDEX:3,TROPONINI:3 in the  last 72 hours CBC:  Lab 04/06/12 0645  WBC 5.4  NEUTROABS --  HGB 11.7*  HCT 34.1*  MCV 88.8  PLT 141*   BNP (last 3 results)  Basename 02/11/12 1230  PROBNP 1106.0*      ASSESSMENT AND PLAN:  Patient Active Hospital Problem List: Nonischemic cardiomyopathy ()  * Atrial fibrillation ()   Hypokalemia (04/06/2012)  CHF-chronic/acute 2/2 afib   The patient has rapid atrial fibrillation. His QTC has been borderline for the use of dofetilide. I think the pre-initiation ECG yesterday and a QTc interval longer and his response to the first dose of dofetilide is also worrisome with a QTC is now over 500 ms. Not withstanding the fact that his potassium is low I think the risks of proceeding with dofetilide R. too high.  It had a 15-20 minute conversation with the family regarding QT  prolongation, antiarrhythmic drug options, the role of catheter ablation. We have reviewed his echo with a left atrial dimension of 41 mm. With his rapid rates in the possibility of tachycardia-induced cardiomyopathy. There has also been however problem sinus bradycardia potentially portends other issues.  Fluid overload is beter Will discharge on 40  bid lasix, amio 400 bid x 14 days outpt DCCV in 14 days wih [name] or [name] Increase lopressor to 50 bid and stop 3 days before cardioversion   Signed, Sherryl Manges MD  04/08/2012

## 2012-04-22 NOTE — Op Note (Signed)
Patient sedated by anethesia with 100 mg Propofol.  With pads in AP position patient was cardioverted ot SR with 200 J synchronized biphasic energy. 12 lead EKG pending.

## 2012-04-22 NOTE — Anesthesia Postprocedure Evaluation (Signed)
  Anesthesia Post-op Note  Patient: Tyrone [name]  Procedure(s) Performed: Procedure(s) (LRB) with comments: CARDIOVERSION (N/A)  Patient Location: Endoscopy Unit  Anesthesia Type: General  Level of Consciousness: awake, alert  and oriented  Airway and Oxygen Therapy: Patient Spontanous Breathing and Patient connected to nasal cannula oxygen  Post-op Pain: none  Post-op Assessment: Post-op Vital signs reviewed  Post-op Vital Signs: Reviewed  Complications: No apparent anesthesia complications

## 2012-04-22 NOTE — Interval H&P Note (Signed)
History and Physical Interval Note:  04/22/2012 2:17 PM  Tyrone [name]  has presented today for surgery, with the diagnosis of a-fib  The various methods of treatment have been discussed with the patient and family. After consideration of risks, benefits and other options for treatment, the patient has consented to  Procedure(s) (LRB) with comments: CARDIOVERSION (N/A) as a surgical intervention .  The patient's history has been reviewed, patient examined, no change in status, stable for surgery.  I have reviewed the patient's chart and labs.  Questions were answered to the patient's satisfaction.     [name]

## 2012-04-22 NOTE — Anesthesia Preprocedure Evaluation (Addendum)
Anesthesia Evaluation  Patient identified by MRN, date of birth, ID band Patient awake and Patient confused    Reviewed: Allergy & Precautions, H&P , NPO status   History of Anesthesia Complications Negative for: history of anesthetic complications  Airway Mallampati: II TM Distance: >3 FB Neck ROM: Full    Dental  (+) Teeth Intact and Dental Advisory Given   Pulmonary  breath sounds clear to auscultation        Cardiovascular hypertension, Pt. on home beta blockers Rhythm:Irregular Rate:Normal     Neuro/Psych    GI/Hepatic Neg liver ROS, GERD-  Controlled,  Endo/Other  negative endocrine ROS  Renal/GU negative Renal ROS  negative genitourinary   Musculoskeletal negative musculoskeletal ROS (+)   Abdominal   Peds  Hematology negative hematology ROS (+)   Anesthesia Other Findings   Reproductive/Obstetrics negative OB ROS                          Anesthesia Physical Anesthesia Plan  ASA: III  Anesthesia Plan: MAC   Post-op Pain Management:    Induction: Intravenous  Airway Management Planned: Mask  Additional Equipment: None  Intra-op Plan:   Post-operative Plan:   Informed Consent: I have reviewed the patients History and Physical, chart, labs and discussed the procedure including the risks, benefits and alternatives for the proposed anesthesia with the patient or authorized representative who has indicated his/her understanding and acceptance.   Dental advisory given  Plan Discussed with: Anesthesiologist, Surgeon and CRNA  Anesthesia Plan Comments:        Anesthesia Quick Evaluation

## 2012-04-30 DIAGNOSIS — R29810 Facial weakness: Secondary | ICD-10-CM | POA: Diagnosis present

## 2012-04-30 DIAGNOSIS — I428 Other cardiomyopathies: Secondary | ICD-10-CM | POA: Diagnosis present

## 2012-04-30 DIAGNOSIS — I509 Heart failure, unspecified: Secondary | ICD-10-CM | POA: Diagnosis present

## 2012-04-30 DIAGNOSIS — G4733 Obstructive sleep apnea (adult) (pediatric): Secondary | ICD-10-CM | POA: Diagnosis present

## 2012-04-30 DIAGNOSIS — G610 Guillain-Barre syndrome: Secondary | ICD-10-CM | POA: Diagnosis present

## 2012-04-30 DIAGNOSIS — Z833 Family history of diabetes mellitus: Secondary | ICD-10-CM

## 2012-04-30 DIAGNOSIS — I495 Sick sinus syndrome: Secondary | ICD-10-CM | POA: Diagnosis present

## 2012-04-30 DIAGNOSIS — Z87891 Personal history of nicotine dependence: Secondary | ICD-10-CM

## 2012-04-30 DIAGNOSIS — I5042 Chronic combined systolic (congestive) and diastolic (congestive) heart failure: Secondary | ICD-10-CM | POA: Diagnosis present

## 2012-04-30 DIAGNOSIS — R4701 Aphasia: Secondary | ICD-10-CM | POA: Diagnosis present

## 2012-04-30 DIAGNOSIS — I10 Essential (primary) hypertension: Secondary | ICD-10-CM | POA: Diagnosis present

## 2012-04-30 DIAGNOSIS — I4510 Unspecified right bundle-branch block: Secondary | ICD-10-CM | POA: Diagnosis present

## 2012-04-30 DIAGNOSIS — I4891 Unspecified atrial fibrillation: Secondary | ICD-10-CM | POA: Diagnosis present

## 2012-04-30 DIAGNOSIS — Z8249 Family history of ischemic heart disease and other diseases of the circulatory system: Secondary | ICD-10-CM

## 2012-04-30 DIAGNOSIS — E785 Hyperlipidemia, unspecified: Secondary | ICD-10-CM | POA: Diagnosis present

## 2012-04-30 DIAGNOSIS — Z9884 Bariatric surgery status: Secondary | ICD-10-CM

## 2012-04-30 DIAGNOSIS — I6340 Cerebral infarction due to embolism of unspecified cerebral artery: Principal | ICD-10-CM | POA: Diagnosis present

## 2012-05-01 ENCOUNTER — Inpatient Hospital Stay (HOSPITAL_COMMUNITY)
Admission: EM | Admit: 2012-05-01 | Discharge: 2012-05-02 | DRG: 533 | Disposition: A | Discharge status: Home / Self Care | Payer: BC Managed Care – PPO | Attending: Internal Medicine | Admitting: Internal Medicine

## 2012-05-01 ENCOUNTER — Encounter (HOSPITAL_COMMUNITY): Payer: Self-pay | Admitting: Emergency Medicine

## 2012-05-01 ENCOUNTER — Observation Stay (HOSPITAL_COMMUNITY): Payer: BC Managed Care – PPO

## 2012-05-01 DIAGNOSIS — I495 Sick sinus syndrome: Secondary | ICD-10-CM

## 2012-05-01 DIAGNOSIS — I4891 Unspecified atrial fibrillation: Secondary | ICD-10-CM

## 2012-05-01 DIAGNOSIS — I5042 Chronic combined systolic (congestive) and diastolic (congestive) heart failure: Secondary | ICD-10-CM

## 2012-05-01 DIAGNOSIS — I428 Other cardiomyopathies: Secondary | ICD-10-CM

## 2012-05-01 DIAGNOSIS — I10 Essential (primary) hypertension: Secondary | ICD-10-CM

## 2012-05-01 DIAGNOSIS — E876 Hypokalemia: Secondary | ICD-10-CM

## 2012-05-01 DIAGNOSIS — E669 Obesity, unspecified: Secondary | ICD-10-CM

## 2012-05-01 DIAGNOSIS — G459 Transient cerebral ischemic attack, unspecified: Secondary | ICD-10-CM

## 2012-05-01 DIAGNOSIS — K2100 Gastro-esophageal reflux disease with esophagitis, without bleeding: Secondary | ICD-10-CM

## 2012-05-01 DIAGNOSIS — I639 Cerebral infarction, unspecified: Secondary | ICD-10-CM

## 2012-05-01 DIAGNOSIS — I6340 Cerebral infarction due to embolism of unspecified cerebral artery: Principal | ICD-10-CM | POA: Diagnosis present

## 2012-05-01 HISTORY — DX: Cerebral infarction, unspecified: I63.9

## 2012-05-01 LAB — COMPREHENSIVE METABOLIC PANEL
ALT: 20 U/L (ref 0–53)
AST: 14 U/L (ref 0–37)
Albumin: 3.8 g/dL (ref 3.5–5.2)
Alkaline Phosphatase: 83 U/L (ref 39–117)
BUN: 13 mg/dL (ref 6–23)
CO2: 26 mEq/L (ref 19–32)
Calcium: 8.6 mg/dL (ref 8.4–10.5)
Chloride: 103 mEq/L (ref 96–112)
Creatinine, Ser: 1.31 mg/dL (ref 0.50–1.35)
GFR calc Af Amer: 73 mL/min — ABNORMAL LOW (ref >90)
GFR calc non Af Amer: 63 mL/min — ABNORMAL LOW (ref >90)
Glucose, Bld: 97 mg/dL (ref 70–99)
Potassium: 3.8 mEq/L (ref 3.5–5.1)
Sodium: 137 mEq/L (ref 135–145)
Total Bilirubin: 0.5 mg/dL (ref 0.3–1.2)
Total Protein: 6.6 g/dL (ref 6.0–8.3)

## 2012-05-01 LAB — CBC WITH DIFFERENTIAL/PLATELET
Basophils Absolute: 0 10*3/uL (ref 0.0–0.1)
Basophils Relative: 1 % (ref 0–1)
Eosinophils Absolute: 0.1 10*3/uL (ref 0.0–0.7)
Eosinophils Relative: 2 % (ref 0–5)
HCT: 35.2 % — ABNORMAL LOW (ref 39.0–52.0)
Hemoglobin: 11.8 g/dL — ABNORMAL LOW (ref 13.0–17.0)
Lymphocytes Relative: 44 % (ref 12–46)
Lymphs Abs: 2.6 10*3/uL (ref 0.7–4.0)
MCH: 30.6 pg (ref 26.0–34.0)
MCHC: 33.5 g/dL (ref 30.0–36.0)
MCV: 91.4 fL (ref 78.0–100.0)
Monocytes Absolute: 0.5 10*3/uL (ref 0.1–1.0)
Monocytes Relative: 8 % (ref 3–12)
Neutro Abs: 2.7 10*3/uL (ref 1.7–7.7)
Neutrophils Relative %: 45 % (ref 43–77)
Platelets: 145 10*3/uL — ABNORMAL LOW (ref 150–400)
RBC: 3.85 MIL/uL — ABNORMAL LOW (ref 4.22–5.81)
RDW: 13.7 % (ref 11.5–15.5)
WBC: 5.9 10*3/uL (ref 4.0–10.5)

## 2012-05-01 LAB — APTT: aPTT: 34 seconds (ref 24–37)

## 2012-05-01 LAB — LIPID PANEL
Cholesterol: 153 mg/dL (ref 0–200)
HDL: 62 mg/dL (ref >39)
LDL Cholesterol: 80 mg/dL (ref 0–99)
Total CHOL/HDL Ratio: 2.5 RATIO
Triglycerides: 56 mg/dL (ref <150)
VLDL: 11 mg/dL (ref 0–40)

## 2012-05-01 LAB — HEMOGLOBIN A1C
Hgb A1c MFr Bld: 5.6 % (ref <5.7)
Mean Plasma Glucose: 114 mg/dL (ref <117)

## 2012-05-01 LAB — POCT I-STAT TROPONIN I: Troponin i, poc: 0 ng/mL (ref 0.00–0.08)

## 2012-05-01 LAB — PROTIME-INR
INR: 1.29 (ref 0.00–1.49)
Prothrombin Time: 15.8 seconds — ABNORMAL HIGH (ref 11.6–15.2)

## 2012-05-01 MED ORDER — CALCIUM CITRATE 950 (200 CA) MG PO TABS
1.0000 | ORAL_TABLET | Freq: Every day | ORAL | Status: DC
  Filled 2012-05-01: qty 1

## 2012-05-01 MED ORDER — FUROSEMIDE 40 MG PO TABS
40.0000 mg | ORAL_TABLET | Freq: Two times a day (BID) | ORAL | Status: DC
  Administered 2012-05-01 – 2012-05-02 (×2): 40 mg via ORAL
  Filled 2012-05-01 (×3): qty 1

## 2012-05-01 MED ORDER — VITAMIN E 180 MG (400 UNIT) PO CAPS
400.0000 [IU] | ORAL_CAPSULE | Freq: Every day | ORAL | Status: DC
  Administered 2012-05-02: 400 [IU] via ORAL
  Filled 2012-05-01: qty 1

## 2012-05-01 MED ORDER — METOPROLOL TARTRATE 25 MG PO TABS
25.0000 mg | ORAL_TABLET | Freq: Two times a day (BID) | ORAL | Status: DC
  Administered 2012-05-01: 25 mg via ORAL
  Filled 2012-05-01 (×3): qty 1

## 2012-05-01 MED ORDER — SODIUM CHLORIDE 0.9 % IV SOLN: 20.0000 mL | INTRAVENOUS | Status: DC

## 2012-05-01 MED ORDER — ACETAMINOPHEN 650 MG RE SUPP: 650.0000 mg | Freq: Four times a day (QID) | RECTAL | Status: DC | PRN

## 2012-05-01 MED ORDER — ACETAMINOPHEN 325 MG PO TABS: 650.0000 mg | ORAL_TABLET | Freq: Four times a day (QID) | ORAL | Status: DC | PRN

## 2012-05-01 MED ORDER — LOSARTAN POTASSIUM 50 MG PO TABS
50.0000 mg | ORAL_TABLET | Freq: Two times a day (BID) | ORAL | Status: DC
  Administered 2012-05-02: 50 mg via ORAL
  Filled 2012-05-01 (×3): qty 1

## 2012-05-01 MED ORDER — SPIRONOLACTONE 25 MG PO TABS
25.0000 mg | ORAL_TABLET | Freq: Every day | ORAL | Status: DC
  Administered 2012-05-01 – 2012-05-02 (×2): 25 mg via ORAL
  Filled 2012-05-01 (×2): qty 1

## 2012-05-01 MED ORDER — METOPROLOL TARTRATE 50 MG PO TABS
50.0000 mg | ORAL_TABLET | Freq: Two times a day (BID) | ORAL | Status: DC
  Filled 2012-05-01: qty 1

## 2012-05-01 MED ORDER — SODIUM CHLORIDE 0.9 % IJ SOLN
3.0000 mL | Freq: Two times a day (BID) | INTRAMUSCULAR | Status: DC
  Administered 2012-05-01 – 2012-05-02 (×2): 3 mL via INTRAVENOUS

## 2012-05-01 MED ORDER — ADULT MULTIVITAMIN W/MINERALS CH
1.0000 | ORAL_TABLET | Freq: Every day | ORAL | Status: DC
  Administered 2012-05-02: 1 via ORAL
  Filled 2012-05-01: qty 1

## 2012-05-01 MED ORDER — ONE-DAILY MULTI VITAMINS PO TABS: 3.0000 | ORAL_TABLET | Freq: Every day | ORAL | Status: DC

## 2012-05-01 MED ORDER — RIVAROXABAN 20 MG PO TABS
20.0000 mg | ORAL_TABLET | Freq: Every day | ORAL | Status: DC
  Administered 2012-05-01 – 2012-05-02 (×2): 20 mg via ORAL
  Filled 2012-05-01 (×4): qty 1

## 2012-05-01 MED ORDER — AMIODARONE HCL 200 MG PO TABS
400.0000 mg | ORAL_TABLET | Freq: Every day | ORAL | Status: DC
  Administered 2012-05-01 – 2012-05-02 (×2): 400 mg via ORAL
  Filled 2012-05-01 (×2): qty 2

## 2012-05-01 NOTE — ED Notes (Signed)
Pt. oob to the bathroom, brushed teeth, and freshened up.  Gait steady, pt. Denies any pain or discomfort.  Pt. Reports that his lt. Fingers that were tingling is gone.  No neuro deficits noted.

## 2012-05-01 NOTE — Progress Notes (Signed)
  Echocardiogram 2D Echocardiogram has been performed.  Yavuz Kirby 05/01/2012, 8:49 AM

## 2012-05-01 NOTE — ED Provider Notes (Signed)
History     CSN: [id number]  Arrival date & time 04/30/12  2355   First MD Initiated Contact with Patient 05/01/12 0032      Chief Complaint  Patient presents with  . Aphasia    (Consider location/radiation/quality/duration/timing/severity/associated sxs/prior treatment) HPI HX per PT and his wife. At home tonight had sudden onset of aphasia and L hand tingling, his wife noticed he had L facial droop, this lasted about 2 minutes and then PT developed ability to speak normally and facial droop resolved. Now with some residual L hand tingling but no weakness, no traouble with gait or coordination, is on pradaxa for h/o A fib and recent cardioversion. No Cp or SOB, no palpitations, followed by [facility] and states his HR has been around 40 for the l;ast week, no syncope/ near syncope. Symptoms were Mod in severity Past Medical History  Diagnosis Date  . External thrombosed hemorrhoids   . Chest pain   . Osteoarthrosis, unspecified whether generalized or localized, ankle and foot     of the knees,ankle and foot  . Guillain-Barre syndrome   . Cervicalgia   . Vitamin B 12 deficiency   . Urticaria, unspecified   . Sinoatrial node dysfunction   . Morbid obesity s/p gastric bypass   . Hernia of unspecified site of abdominal cavity without mention of obstruction or gangrene     of abdominal  . Unspecified intestinal malabsorption   . Pancytopenia   . Hypertension   . Sleep apnea     CPAP  . GERD (gastroesophageal reflux disease)   . Atrial fibrillation     a. Pradaxa started 01/2012;  b .s/p TEE/DCCV 02/16/2012; c. recurrent afib->Tikosyn Initiation 03/2012  . Nonischemic cardiomyopathy     a. 01/2012 Echo: EF 35-40%, Diff HK, mild MR;  b. 01/2012 R&L Heart Cath: mildly elevated R heart pressures (pcwp 17) , nl cors.  . Chronic systolic CHF (congestive heart failure)     a. 01/2012 Echo: EF 35-40%, Diff HK, mild MR  . Hematuria     a. 01/2012 - to f/u with urology as outpt.    Past Surgical  History  Procedure Date  . Gastric bypass 2004  . Umbilical hernia repair     x 2  . Hernia repair   . Tee without cardioversion 02/16/2012    Procedure: TRANSESOPHAGEAL ECHOCARDIOGRAM (TEE);  Surgeon: [name], MD;  Location: [facility] ENDOSCOPY;  Service: Cardiovascular;  Laterality: N/A;  10a CV  . Cardioversion 02/16/2012    Procedure: CARDIOVERSION;  Surgeon: [name], MD;  Location: [facility] ENDOSCOPY;  Service: Cardiovascular;  Laterality: N/A;  . Cardioversion 04/22/2012    Procedure: CARDIOVERSION;  Surgeon: [name], MD;  Location: [facility] ENDOSCOPY;  Service: Cardiovascular;  Laterality: N/A;    Family History  Problem Relation Age of Onset  . Hypertension Mother   . Hypertension Father   . Heart attack Father 62    Died  . Diabetes Father   . Kidney disease Father     with transplant    History  Substance Use Topics  . Smoking status: Never Smoker   . Smokeless tobacco: Former Neurosurgeon    Types: Chew    Quit date: 11/11/2002  . Alcohol Use: Yes      Review of Systems  Constitutional: Negative for fever and chills.  HENT: Negative for neck pain and neck stiffness.   Eyes: Negative for pain.  Respiratory: Negative for shortness of breath.   Cardiovascular: Negative for  chest pain and palpitations.  Gastrointestinal: Negative for abdominal pain.  Genitourinary: Negative for dysuria.  Musculoskeletal: Negative for back pain.  Skin: Negative for rash.  Neurological: Positive for facial asymmetry and numbness. Negative for seizures and headaches.  All other systems reviewed and are negative.    Allergies  Review of patient's allergies indicates no known allergies.  Home Medications   Current Outpatient Rx  Name Route Sig Dispense Refill  . AMIODARONE HCL 400 MG PO TABS Oral Take 1 tablet (400 mg total) by mouth 2 (two) times daily. Take 1 tablet (400 mg) twice daily for 14 days, then take 1 tablet (400 mg) once daily thereafter. 60 tablet 3  . CALCIUM  CITRATE 950 MG PO TABS Oral Take 1 tablet by mouth daily.    Marland Kitchen VITAMIN B12 PO Oral Take 1 tablet by mouth daily.     Marland Kitchen DABIGATRAN ETEXILATE MESYLATE 150 MG PO CAPS Oral Take 1 capsule (150 mg total) by mouth every 12 (twelve) hours. 60 capsule 6  . FUROSEMIDE 40 MG PO TABS Oral Take 1 tablet (40 mg total) by mouth 2 (two) times daily. 60 tablet 6  . LOSARTAN POTASSIUM 50 MG PO TABS Oral Take 1 tablet (50 mg total) by mouth 2 (two) times daily. 60 tablet 6    Dose increased 12/26/2011.  Marland Kitchen METOPROLOL TARTRATE 25 MG PO TABS Oral Take 2 tablets (50 mg total) by mouth 2 (two) times daily. 120 tablet 6  . ONE-DAILY MULTI VITAMINS PO TABS Oral Take 3 tablets by mouth daily.     Marland Kitchen SPIRONOLACTONE 50 MG PO TABS Oral Take 0.5 tablets (25 mg total) by mouth daily. 30 tablet 6  . VITAMIN E 400 UNITS PO CAPS Oral Take 400 Units by mouth daily.      BP 133/71  Pulse 40  Temp 97.8 F (36.6 C) (Oral)  Resp 18  SpO2 97%  Physical Exam  Constitutional: He is oriented to person, place, and time. He appears well-developed and well-nourished.  HENT:  Head: Normocephalic and atraumatic.  Eyes: Conjunctivae normal and EOM are normal. Pupils are equal, round, and reactive to light.  Neck: Full passive range of motion without pain. Neck supple. No thyromegaly present.  Cardiovascular: Normal rate, regular rhythm, S1 normal, S2 normal and intact distal pulses.   Pulmonary/Chest: Effort normal and breath sounds normal.  Abdominal: Soft. Bowel sounds are normal. There is no tenderness. There is no CVA tenderness.  Musculoskeletal: Normal range of motion.  Neurological: He is alert and oriented to person, place, and time. He has normal strength and normal reflexes. No cranial nerve deficit or sensory deficit. He displays a negative Romberg sign. GCS eye subscore is 4. GCS verbal subscore is 5. GCS motor subscore is 6.       Normal Gait. No measurable sensory deficits x 4 ext. Equal intact UE/ LE strengths. No facial  droop  Skin: Skin is warm and dry. No rash noted. No cyanosis. Nails show no clubbing.  Psychiatric: He has a normal mood and affect. His speech is normal and behavior is normal.    ED Course  Procedures (including critical care time)  Results for orders placed during the hospital encounter of 05/01/12  CBC WITH DIFFERENTIAL      Component Value Range   WBC 5.9  4.0 - 10.5 K/uL   RBC 3.85 (*) 4.22 - 5.81 MIL/uL   Hemoglobin 11.8 (*) 13.0 - 17.0 g/dL   HCT 40.9 (*) 81.1 - 91.4 %  MCV 91.4  78.0 - 100.0 fL   MCH 30.6  26.0 - 34.0 pg   MCHC 33.5  30.0 - 36.0 g/dL   RDW 16.1  09.6 - 04.5 %   Platelets 145 (*) 150 - 400 K/uL   Neutrophils Relative 45  43 - 77 %   Neutro Abs 2.7  1.7 - 7.7 K/uL   Lymphocytes Relative 44  12 - 46 %   Lymphs Abs 2.6  0.7 - 4.0 K/uL   Monocytes Relative 8  3 - 12 %   Monocytes Absolute 0.5  0.1 - 1.0 K/uL   Eosinophils Relative 2  0 - 5 %   Eosinophils Absolute 0.1  0.0 - 0.7 K/uL   Basophils Relative 1  0 - 1 %   Basophils Absolute 0.0  0.0 - 0.1 K/uL  COMPREHENSIVE METABOLIC PANEL      Component Value Range   Sodium 137  135 - 145 mEq/L   Potassium 3.8  3.5 - 5.1 mEq/L   Chloride 103  96 - 112 mEq/L   CO2 26  19 - 32 mEq/L   Glucose, Bld 97  70 - 99 mg/dL   BUN 13  6 - 23 mg/dL   Creatinine, Ser 4.09  0.50 - 1.35 mg/dL   Calcium 8.6  8.4 - 81.1 mg/dL   Total Protein 6.6  6.0 - 8.3 g/dL   Albumin 3.8  3.5 - 5.2 g/dL   AST 14  0 - 37 U/L   ALT 20  0 - 53 U/L   Alkaline Phosphatase 83  39 - 117 U/L   Total Bilirubin 0.5  0.3 - 1.2 mg/dL   GFR calc non Af Amer 63 (*) >90 mL/min   GFR calc Af Amer 73 (*) >90 mL/min  PROTIME-INR      Component Value Range   Prothrombin Time 15.8 (*) 11.6 - 15.2 seconds   INR 1.29  0.00 - 1.49  APTT      Component Value Range   aPTT 34  24 - 37 seconds  LIPID PANEL      Component Value Range   Cholesterol 153  0 - 200 mg/dL   Triglycerides 56  <914 mg/dL   HDL 62  >78 mg/dL   Total CHOL/HDL Ratio 2.5       VLDL 11  0 - 40 mg/dL   LDL Cholesterol 80  0 - 99 mg/dL   Ct Head Wo Contrast  05/01/2012  *RADIOLOGY REPORT*  Clinical Data: 46 year old male with slurred speech and altered mental status.  CT HEAD WITHOUT CONTRAST  Technique:  Contiguous axial images were obtained from the base of the skull through the vertex without contrast.  Comparison: None.  Findings: Visualized paranasal sinuses and mastoids are clear. Visualized orbits and scalp soft tissues are within normal limits. No acute osseous abnormality identified.  Cerebral volume is within normal limits for age.  No midline shift, ventriculomegaly, mass effect, evidence of mass lesion, intracranial hemorrhage or evidence of cortically based acute infarction.  Gray-white matter differentiation is within normal limits throughout the brain.  No suspicious intracranial vascular hyperdensity.  IMPRESSION: Normal noncontrast CT appearance of the brain.   Original Report Authenticated By: Harley Hallmark, M.D.      Date: 05/01/2012  Rate: 42  Rhythm: sinus bradycardia  QRS Axis: normal  Intervals: normal  ST/T Wave abnormalities: nonspecific ST changes  Conduction Disutrbances:nonspecific intraventricular conduction delay  Narrative Interpretation:   Old EKG Reviewed: unchanged  Normal serial neuro exams, work up as above - ECG , labs and CT brain reviewed.   Plan CDU OBS for TIA protocol and MRI in am. PT agreeable to plan ABCD2 score = 4  MDM   Adult male with brief TIA symptoms now normal neuro exam. Has h/o afib on pradaxa now in NSR. No stroke history. No bleed on CT scan. Labs and nursing notes reviewed.         Sunnie Nielsen, MD 05/01/12 [phone]

## 2012-05-01 NOTE — Consult Note (Addendum)
CARDIOLOGY CONSULT NOTE  Patient ID: Tyrone [name], MRN: [id number], DOB/AGE: 02-14-1966 46 y.o. Admit date: 05/01/2012   Date of Consult: 05/01/2012 Primary Physician: [name], MD Primary Cardiologist:   Zannie Kehr   Reason for Consult:   Atrial fibrillation and CVA  HPI:  This pleasant gentleman has atrial fibrillation. Recently he was considered for Tikosyn. After further review of his QT interval it was felt that this would not be a good choice for him. He was started on amiodarone. He then underwent outpatient cardioversion on April 22, 2012. This was successful with return to sinus rhythm. He has remained in sinus rhythm since then. He is on Pradaxa 150 mg twice a day. He is very careful and never misses medication. He makes the point that the only pill that he's missed is the one from this morning. It is also noted that there is plan for an atrial fib ablation at some point.  Today he developed an acute neurologic abnormality. He noted decreased motion of his left hand and arm. He has been assessed by neurology. It is felt that he is a very small right Frontal stroke. There is no bleeding. It is felt to most probably be related to his atrial fibrillation. He is being admitted for observation tonight. We will have to decide how to proceed with his anticoagulation. Neurology feels that the stroke is very small and that the bleeding risk is very small. They are concerned that the recurrent stroke risk is high.  An additional issue was some left ventricular dysfunction recently. Echo done today as part of the stroke protocol shows no obvious cardiac source. In addition the ejection fraction appears to be in the 55-60% range.   Past Medical History  Diagnosis Date  . External thrombosed hemorrhoids   . Chest pain   . Osteoarthrosis, unspecified whether generalized or localized, ankle and foot     of the knees,ankle and foot  . Guillain-Barre syndrome   . Cervicalgia   .  Vitamin B 12 deficiency   . Urticaria, unspecified   . Sinoatrial node dysfunction   . Morbid obesity s/p gastric bypass   . Hernia of unspecified site of abdominal cavity without mention of obstruction or gangrene     of abdominal  . Unspecified intestinal malabsorption   . Pancytopenia   . Hypertension   . Sleep apnea     CPAP  . GERD (gastroesophageal reflux disease)   . Atrial fibrillation     a. Pradaxa started 01/2012;  b .s/p TEE/DCCV 02/16/2012; c. recurrent afib->Tikosyn Initiation 03/2012  . Nonischemic cardiomyopathy     a. 01/2012 Echo: EF 35-40%, Diff HK, mild MR;  b. 01/2012 R&L Heart Cath: mildly elevated R heart pressures (pcwp 17) , nl cors.  . Chronic systolic CHF (congestive heart failure)     a. 01/2012 Echo: EF 35-40%, Diff HK, mild MR  . Hematuria     a. 01/2012 - to f/u with urology as outpt.      Most Recent Cardiac Studies:    Surgical History:  Past Surgical History  Procedure Date  . Gastric bypass 2004  . Umbilical hernia repair     x 2  . Hernia repair   . Tee without cardioversion 02/16/2012    Procedure: TRANSESOPHAGEAL ECHOCARDIOGRAM (TEE);  Surgeon: [name], MD;  Location: [facility] ENDOSCOPY;  Service: Cardiovascular;  Laterality: N/A;  10a CV  . Cardioversion 02/16/2012    Procedure: CARDIOVERSION;  Surgeon: Madolyn Frieze  Jens Som, MD;  Location: [facility] ENDOSCOPY;  Service: Cardiovascular;  Laterality: N/A;  . Cardioversion 04/22/2012    Procedure: CARDIOVERSION;  Surgeon: [name], MD;  Location: [facility] ENDOSCOPY;  Service: Cardiovascular;  Laterality: N/A;     Home Meds: Prior to Admission medications   Medication Sig Start Date End Date Taking? Authorizing Provider  amiodarone (PACERONE) 400 MG tablet Take 1 tablet (400 mg total) by mouth 2 (two) times daily. Take 1 tablet (400 mg) twice daily for 14 days, then take 1 tablet (400 mg) once daily thereafter. 04/08/12  Yes Brooke O Edmisten, PA-C  calcium citrate (CALCITRATE - DOSED IN MG ELEMENTAL  CALCIUM) 950 MG tablet Take 1 tablet by mouth daily.   Yes Historical Provider, MD  Cyanocobalamin (VITAMIN B12 PO) Take 1 tablet by mouth daily.    Yes Historical Provider, MD  dabigatran (PRADAXA) 150 MG CAPS Take 1 capsule (150 mg total) by mouth every 12 (twelve) hours. 02/16/12  Yes Ok Anis, NP  furosemide (LASIX) 40 MG tablet Take 1 tablet (40 mg total) by mouth 2 (two) times daily. 04/08/12 04/08/13 Yes Brooke O Edmisten, PA-C  losartan (COZAAR) 50 MG tablet Take 1 tablet (50 mg total) by mouth 2 (two) times daily. 12/26/11  Yes Rollene Rotunda, MD  metoprolol tartrate (LOPRESSOR) 25 MG tablet Take 2 tablets (50 mg total) by mouth 2 (two) times daily. 04/08/12 04/08/13 Yes Brooke O Edmisten, PA-C  Multiple Vitamin (MULTIVITAMIN) tablet Take 3 tablets by mouth daily.    Yes Historical Provider, MD  spironolactone (ALDACTONE) 50 MG tablet Take 0.5 tablets (25 mg total) by mouth daily. 02/16/12 02/15/13 Yes Ok Anis, NP  vitamin E (VITAMIN E) 400 UNIT capsule Take 400 Units by mouth daily.   Yes Historical Provider, MD    Inpatient Medications:    Allergies: No Known Allergies  History   Social History  . Marital Status: Married    Spouse Name: N/A    Number of Children: 1  . Years of Education: N/A   Occupational History  . Realtor    Social History Main Topics  . Smoking status: Never Smoker   . Smokeless tobacco: Former Neurosurgeon    Types: Chew    Quit date: 11/11/2002  . Alcohol Use: Yes  . Drug Use: Not on file  . Sexually Active: Yes   Other Topics Concern  . Not on file   Social History Narrative   Lives with wife.       Family History  Problem Relation Age of Onset  . Hypertension Mother   . Hypertension Father   . Heart attack Father 42    Died  . Diabetes Father   . Kidney disease Father     with transplant     Review of Systems:Patient denies fever, chills, headache, sweats, rash, change in vision, change in hearing, chest pain, cough,  nausea vomiting, urinary symptoms. All other systems are reviewed and are negative other than the history of present illness.  Labs: No results found for this basename: CKTOTAL:4,CKMB:4,TROPONINI:4 in the last 72 hours Lab Results  Component Value Date   WBC 5.9 05/01/2012   HGB 11.8* 05/01/2012   HCT 35.2* 05/01/2012   MCV 91.4 05/01/2012   PLT 145* 05/01/2012    Lab 05/01/12 0010  NA 137  K 3.8  CL 103  CO2 26  BUN 13  CREATININE 1.31  CALCIUM 8.6  PROT 6.6  BILITOT 0.5  ALKPHOS 83  ALT 20  AST 14  GLUCOSE 97   Lab Results  Component Value Date   CHOL 153 05/01/2012   HDL 62 05/01/2012   LDLCALC 80 05/01/2012   TRIG 56 05/01/2012   Lab Results  Component Value Date   DDIMER 0.35 02/12/2012    Radiology/Studies:  Ct Head Wo Contrast  05/01/2012  *RADIOLOGY REPORT*  Clinical Data: 46 year old male with slurred speech and altered mental status.  CT HEAD WITHOUT CONTRAST  Technique:  Contiguous axial images were obtained from the base of the skull through the vertex without contrast.  Comparison: None.  Findings: Visualized paranasal sinuses and mastoids are clear. Visualized orbits and scalp soft tissues are within normal limits. No acute osseous abnormality identified.  Cerebral volume is within normal limits for age.  No midline shift, ventriculomegaly, mass effect, evidence of mass lesion, intracranial hemorrhage or evidence of cortically based acute infarction.  Gray-white matter differentiation is within normal limits throughout the brain.  No suspicious intracranial vascular hyperdensity.  IMPRESSION: Normal noncontrast CT appearance of the brain.   Original Report Authenticated By: Harley Hallmark, M.D.    Mr Brain Wo Contrast  05/01/2012  *RADIOLOGY REPORT*  Clinical Data: Slurred speech.  Left facial droop and left-sided weakness.  MRI HEAD WITHOUT CONTRAST  Technique:  Multiplanar, multiecho pulse sequences of the brain and surrounding structures were obtained according to  standard protocol without intravenous contrast.  Comparison: CT 05/01/2012  Findings: Small area of acute infarction in the right parietal cortex.  No associated hemorrhage.  Small area of chronic infarct in the right frontal white matter. No other significant ischemic change.  Brainstem and cerebellum are normal.  Basal ganglia is intact.  Empty sella is noted with a small pituitary gland in the floor of the sella.  The vessels at the base of the brain are patent.  Paranasal sinuses are clear.  IMPRESSION: Small area of acute infarction right parietal cortex.   Original Report Authenticated By: Camelia Phenes, M.D.     EKG: EKG shows normal sinus rhythm with no acute change.  Physical Exam: Blood pressure 133/77, pulse 41, temperature 97.7 F (36.5 C), temperature source Oral, resp. rate 16, SpO2 99.00%.  The patient is oriented to person time and place. Affect is normal. There is no jugulovenous distention. There no carotid bruits. Lungs are clear. Respiratory effort is nonlabored. Cardiac exam reveals S1 and S2. There is a 2/6 systolic murmur. The abdomen is soft. There is no peripheral edema. There no musculoskeletal deformities. There are no skin rashes. He is now moving his left arm and left common. I have not done a complete neurologic evaluation. This has been done by neurology.  Assessment and Plan:   Principal Problem:  *Cerebral embolism with cerebral infarction     The patient is being admitted and will be observed overnight. He has had a very small CVA. It is felt to be embolic. Question concerning his anticoagulation is quite difficult. I am reminded by Dr. [name] that at this time the only drug that has shown superiority to Coumadin for stroke prevention he is Pradaxa which he is on. It is therefore difficult to be sure how to proceed with the next change in his drugs. Neurology has mentioned that they would consider changing him to Coumadin, but they do not feel that there is a  literature were to necessarily recommend the step. Dr. [name] will be reviewing and talking with others on our team who are the most experience with this question.  We will make a decision rapidly. He has not yet received his pradaxa. He is at his highest stroke risk at this time. Neurology has made it clear that they feel that his stroke is very small. They also feel that his bleed risk is very low. We will make decision about his meds soon and proceed with therapy.   (13:33 UPDATE) I have spoken further with Dr. [name] who has conferred with Dr. [name] in others. It is felt that Xarelto was studied with patients with higher chads score. It would therefore be reasonable to consider this drug. Another option would be Coumadin keeping the INR 2.5-3.5. The final decision will be Xarelto 20 mg daily. The first dose will be given this evening after supper.   Nonischemic cardiomyopathy     Recently the patient had left ventricular dysfunction. Currently in sinus rhythm his ejection fraction is in the 55-60% range.   Atrial fibrillation     He is in sinus rhythm at this time. He continues on amiodarone orally. There is consideration being given to atrial fibrillation. With his current event we would not recommend proceeding with atrial fibrillation in the near future. Possibly moving this procedure sooner would not decrease his risk of recurrent CVA.   Chronic combined systolic and diastolic heart failure    The patient has no signs of congestive heart failure at this time. We will continue his usual medications.  As part of my evaluation today I have spoken with Dr. Thad Ranger of neurology and I have spoken with Dr. [name] of electrophysiology.    Signed, Jerral Bonito, MD, [facility] 05/01/2012, 12:56 PM

## 2012-05-01 NOTE — ED Provider Notes (Signed)
Pt placed in TIA protocol last night. Pts MRI shows that he has an acute small parietal infarct on the right side. I have consulted Cardiology [facility]) as patient was cardioverted from A. Fib on 04/22/2012 by Dr. [name]. I have made them aware of his visit to the ER and results. Neurology, Dr. Thad Ranger has been consulted and has agreed to come see the patient in the CDU. The remainder of the patients stay in the ED as been uneventful.   Neurology to admit.  Dorthula Matas, PA 05/01/12 1003  Neurology has seen patient and do not feel that they need to admit. They have asked cardiology to come see patient as they think cardio ablation procedure needs to be significantly moved up as opposed to next month.  Cardiology and neurology have seen patient. Unsure of what medication needed to treat patient at this time. They feel he is still high risk for another stroke and want Obs on him.  [facility] has agreed to admit. Obs, Team 7, Telemetry, [address]  Dorthula Matas, [place] 05/01/12 1355

## 2012-05-01 NOTE — H&P (Signed)
[facility] History and Physical  Tyrone [name] ZOX:[id number] DOB: Nov 22, 1965 DOA: 05/01/2012  Referring physician:  PCP: [name], MD  Specialists: Neuro and cardiology  Chief Complaint: left facial droop/left arm numbness/weakness  HPI: Tyrone [name]  is a very pleasant 46yo male hx of afib on amiodorone and Pradaxa, chronic systolic CHF, non-ischemic cardiomyopathy, sleep apnea, HTN, GERD presents to ED from home cc left facial droop/left arm numbness. Info obtained from pt and wife who is at bedside. Pt reports feeling better than usual since his most recent cardioversion in sept until last evening while sitting in recliner watching TV he suddenly experienced a loud unusual sounding cough. Wife states she approached pt to check on him and he reports remembering hearing her but unable to respond to her questions. He states he was unable to talk and felt his left forearm go numb to his thumb. Wife reports left corner of mouth was "drawn down" and his speech was slurred. She also reports that he got a "glazed look" on his face and was non responsive. This episode reportedly lasted approximately 2 minutes. States symptoms began to resolve almost immediately. Currently only symptom remaining is some slight left hand/arm weakness. Denies pain, nausea vomiting. Denies headache. Does report some minor visual disturbances over last few weeks and has apt with eye doctor in November. No recent illness, fever. Work up in ED includes MRI which yields small area of acute infarction right parietal cortex. Symptoms came on suddenly, have resolved and characterized as moderate.     Review of Systems: The patient denies anorexia, fever, weight loss,,  decreased hearing, hoarseness, chest pain, syncope, dyspnea on exertion, peripheral edema, balance deficits, hemoptysis, abdominal pain, melena, hematochezia, severe indigestion/heartburn, hematuria, incontinence, genital sores, muscle weakness,  difficulty  walking, depression, unusual weight change, abnormal bleeding, enlarged lymph nodes, angioedema, and breast masses.  Positive for small skin lesion on right side of nose near bridge.    Past Medical History  Diagnosis Date  . External thrombosed hemorrhoids   . Chest pain   . Osteoarthrosis, unspecified whether generalized or localized, ankle and foot     of the knees,ankle and foot  . Guillain-Barre syndrome   . Cervicalgia   . Vitamin B 12 deficiency   . Urticaria, unspecified   . Sinoatrial node dysfunction   . Morbid obesity s/p gastric bypass   . Hernia of unspecified site of abdominal cavity without mention of obstruction or gangrene     of abdominal  . Unspecified intestinal malabsorption   . Pancytopenia   . Hypertension   . Sleep apnea     CPAP  . GERD (gastroesophageal reflux disease)   . Atrial fibrillation     a. Pradaxa started 01/2012;  b .s/p TEE/DCCV 02/16/2012; c. recurrent afib->Tikosyn Initiation 03/2012  . Nonischemic cardiomyopathy     a. 01/2012 Echo: EF 35-40%, Diff HK, mild MR;  b. 01/2012 R&L Heart Cath: mildly elevated R heart pressures (pcwp 17) , nl cors.  . Chronic systolic CHF (congestive heart failure)     a. 01/2012 Echo: EF 35-40%, Diff HK, mild MR  . Hematuria     a. 01/2012 - to f/u with urology as outpt.   Past Surgical History  Procedure Date  . Gastric bypass 2004  . Umbilical hernia repair     x 2  . Hernia repair   . Tee without cardioversion 02/16/2012    Procedure: TRANSESOPHAGEAL ECHOCARDIOGRAM (TEE);  Surgeon: [name], MD;  Location: [facility]  ENDOSCOPY;  Service: Cardiovascular;  Laterality: N/A;  10a CV  . Cardioversion 02/16/2012    Procedure: CARDIOVERSION;  Surgeon: [name], MD;  Location[facility] ENDOSCOPY;  Service: Cardiovascular;  Laterality: N/A;  . Cardioversion 04/22/2012    Procedure: CARDIOVERSION;  Surgeon: [name], MD;  Location: [facility] ENDOSCOPY;  Service: Cardiovascular;  Laterality: N/A;   Social History:   reports that he has never smoked. He quit smokeless tobacco use about 9 years ago. His smokeless tobacco use included Chew. He reports that he drinks alcohol. His drug history not on file. Lives at home with wife. They have 1 child. 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  No Known Allergies  Family History  Problem Relation Age of Onset  . Hypertension Mother   . Hypertension Father   . Heart attack Father 106    Died  . Diabetes Father   . Kidney disease Father     with transplant     Prior to Admission medications   Medication Sig Start Date End Date Taking? Authorizing Provider  amiodarone (PACERONE) 400 MG tablet Take 1 tablet (400 mg total) by mouth 2 (two) times daily. Take 1 tablet (400 mg) twice daily for 14 days, then take 1 tablet (400 mg) once daily thereafter. 04/08/12  Yes Brooke O Edmisten, PA-C  calcium citrate (CALCITRATE - DOSED IN MG ELEMENTAL CALCIUM) 950 MG tablet Take 1 tablet by mouth daily.   Yes Historical Provider, MD  Cyanocobalamin (VITAMIN B12 PO) Take 1 tablet by mouth daily.    Yes Historical Provider, MD  dabigatran (PRADAXA) 150 MG CAPS Take 1 capsule (150 mg total) by mouth every 12 (twelve) hours. 02/16/12  Yes Ok Anis, NP  furosemide (LASIX) 40 MG tablet Take 1 tablet (40 mg total) by mouth 2 (two) times daily. 04/08/12 04/08/13 Yes Brooke O Edmisten, PA-C  losartan (COZAAR) 50 MG tablet Take 1 tablet (50 mg total) by mouth 2 (two) times daily. 12/26/11  Yes Rollene Rotunda, MD  metoprolol tartrate (LOPRESSOR) 25 MG tablet Take 2 tablets (50 mg total) by mouth 2 (two) times daily. 04/08/12 04/08/13 Yes Brooke O Edmisten, PA-C  Multiple Vitamin (MULTIVITAMIN) tablet Take 3 tablets by mouth daily.    Yes Historical Provider, MD  spironolactone (ALDACTONE) 50 MG tablet Take 0.5 tablets (25 mg total) by mouth daily. 02/16/12 02/15/13 Yes Ok Anis, NP  vitamin E (VITAMIN E) 400 UNIT capsule Take 400  Units by mouth daily.   Yes Historical Provider, MD   Physical Exam: Filed Vitals:   05/01/12 0700 05/01/12 0935 05/01/12 1133 05/01/12 1422  BP: 123/71 121/70 133/77 133/81  Pulse: 43 44 41 47  Temp:   97.7 F (36.5 C) 97.9 F (36.6 C)  TempSrc:   Oral Oral  Resp: 19 11 16 18   SpO2: 100% 99% 99% 100%     General:  Awake, alert, well nourished. Looks a little older than stated age  Eyes: PERRL EOMI no scleral icterus  ENT: mucus membranes of mouth moist/pink, ears clear. No drainage nose  Neck: supple. No JVD, full rom  Cardiovascular: RRR, Faint systolic murmur.  No LEE   Respiratory: normal effort. BSCTAB. No wheeze  Abdomen: obese, soft +BS non-tender to palpation  Skin: warm dry. Small round lesion right side of nose near bridge, otherwise no lesions, rash  Musculoskeletal: MOE no joint swelling/erythema. Non-tender to palpation  Psychiatric: appropriate, cooperative  Neurologic: cranial nerve II-XII  intact. Bilateral LE and UE strength 5/5. Speech clear, facial symmetry. Reflexes slightly diminished bilaterally  Labs on Admission:  Basic Metabolic Panel:  Lab 05/01/12 1610  NA 137  K 3.8  CL 103  CO2 26  GLUCOSE 97  BUN 13  CREATININE 1.31  CALCIUM 8.6  MG --  PHOS --   Liver Function Tests:  Lab 05/01/12 0010  AST 14  ALT 20  ALKPHOS 83  BILITOT 0.5  PROT 6.6  ALBUMIN 3.8   No results found for this basename: LIPASE:5,AMYLASE:5 in the last 168 hours No results found for this basename: AMMONIA:5 in the last 168 hours CBC:  Lab 05/01/12 0010  WBC 5.9  NEUTROABS 2.7  HGB 11.8*  HCT 35.2*  MCV 91.4  PLT 145*   Cardiac Enzymes: No results found for this basename: CKTOTAL:5,CKMB:5,CKMBINDEX:5,TROPONINI:5 in the last 168 hours  BNP (last 3 results)  Basename 02/11/12 1230  PROBNP 1106.0*   CBG: No results found for this basename: GLUCAP:5 in the last 168 hours  Radiological Exams on Admission: Ct Head Wo Contrast  05/01/2012   *RADIOLOGY REPORT*  Clinical Data: 46 year old male with slurred speech and altered mental status.  CT HEAD WITHOUT CONTRAST  Technique:  Contiguous axial images were obtained from the base of the skull through the vertex without contrast.  Comparison: None.  Findings: Visualized paranasal sinuses and mastoids are clear. Visualized orbits and scalp soft tissues are within normal limits. No acute osseous abnormality identified.  Cerebral volume is within normal limits for age.  No midline shift, ventriculomegaly, mass effect, evidence of mass lesion, intracranial hemorrhage or evidence of cortically based acute infarction.  Gray-white matter differentiation is within normal limits throughout the brain.  No suspicious intracranial vascular hyperdensity.  IMPRESSION: Normal noncontrast CT appearance of the brain.   Original Report Authenticated By: Harley Hallmark, M.D.    Mr Brain Wo Contrast  05/01/2012  *RADIOLOGY REPORT*  Clinical Data: Slurred speech.  Left facial droop and left-sided weakness.  MRI HEAD WITHOUT CONTRAST  Technique:  Multiplanar, multiecho pulse sequences of the brain and surrounding structures were obtained according to standard protocol without intravenous contrast.  Comparison: CT 05/01/2012  Findings: Small area of acute infarction in the right parietal cortex.  No associated hemorrhage.  Small area of chronic infarct in the right frontal white matter. No other significant ischemic change.  Brainstem and cerebellum are normal.  Basal ganglia is intact.  Empty sella is noted with a small pituitary gland in the floor of the sella.  The vessels at the base of the brain are patent.  Paranasal sinuses are clear.  IMPRESSION: Small area of acute infarction right parietal cortex.   Original Report Authenticated By: Camelia Phenes, M.D.     EKG: Independently reviewed.   Assessment/Plan Principal Problem: 1. Cerebral embolism with cerebral infarction: admit to tele. Appreciate neuro  And  Cards assistance.  CT and MRI as above. Echo with EF 60-65% mildly dilated LV.  Carotid dopplar without significant stenosis Neuro indicates stroke is small and they feel his bleed risk is low. Will start Xarelto 20mg  Daily  per cards and neuro. Pradaxa discontinued Will also ask PT/OT to see.  Lipids ok  2.  Nonischemic cardiomyopathy: echo yields EF 60%. No CP. Currently SR. Monitor on tele Currently compensated, continue metoprolol, lasix, losartan, aldactone  3.  Atrial fibrillation: currently SR. Continue Amiodarone, switched from Pradaxa to Xarelto as noted above   4. OSA Code Status: full Family Communication:  pt and wife at bedside Disposition Plan: home when medically stable hopefully 24-36 hours.  Time spent: 40 minutes  Gwenyth Bender   NP [facility]   If 7PM-7AM, please contact night-coverage [email or web address] Password [email or web address] 05/01/2012, 2:32 PM   Pt seen and examined Agree with A/P as noted by Toya Smothers, NP R parietal CVA, P.Afib, NICM, anticoagulation switched from Pradaxa to Xarelto per cards recommendations  Pt eval, Neuro checks  Zannie Cove, MD [phone]

## 2012-05-01 NOTE — ED Notes (Signed)
Dr. Thad Ranger in with pt.

## 2012-05-01 NOTE — ED Provider Notes (Signed)
Medical screening examination/treatment/procedure(s) were performed by non-physician practitioner and as supervising physician I was immediately available for consultation/collaboration.  Zelma Snead L Natalio Salois, MD 05/01/12 2028 

## 2012-05-01 NOTE — Progress Notes (Signed)
Spoke with Shanda Bumps, [place] with [facility] regarding patient HR (39-41) and medications. Per Shanda Bumps, ok to give Metoprolol 25mg  BID and amiodarone 400mg  daily with his heart rate ranging low. Only hold medications for sympotamatic bradycardia. Pt is currently asymptomatic and resting in his room with family. Will continue to monitor patient closely. Ramond Craver, Rn

## 2012-05-01 NOTE — ED Notes (Signed)
Family at bedside. 

## 2012-05-01 NOTE — Progress Notes (Signed)
VASCULAR LAB PRELIMINARY  PRELIMINARY  PRELIMINARY  PRELIMINARY  Carotid duplex  completed.    Preliminary report:  Bilateral:  No evidence of hemodynamically significant internal carotid artery stenosis.   Vertebral artery flow is antegrade.      Rehaan Viloria, RVT 05/01/2012, 8:32 AM

## 2012-05-01 NOTE — Consult Note (Signed)
[facility] STROKE CONSULT NOTE       Chief Complaint: stroke   HPI:    Tyrone [name] is an 46 y.o. male with known A-fib on Pradaxa and recently underwent cardioversion 1 week ago. Patient was his normal state until last night at 10:30. He was sleeping and awoke coughing loudly. His wife cam to his side and and noted a left facial droop and that he was not responding.  He could understand her but had difficulty expressing himself for about 1 minute. Since then he has felt his left arm is heavy feeling but no other deficits.  He was brought to ED where CT head negative but MRI shows a right parietal infarct. Per patient and wife, he is scheduled for a trial ablation in November.   LSN: 10:30 pm 04-30-12 tPA Given: No: symptoms resolved    Past Medical History  Diagnosis Date  . External thrombosed hemorrhoids   . Chest pain   . Osteoarthrosis, unspecified whether generalized or localized, ankle and foot     of the knees,ankle and foot  . Guillain-Barre syndrome   . Cervicalgia   . Vitamin B 12 deficiency   . Urticaria, unspecified   . Sinoatrial node dysfunction   . Morbid obesity s/p gastric bypass   . Hernia of unspecified site of abdominal cavity without mention of obstruction or gangrene     of abdominal  . Unspecified intestinal malabsorption   . Pancytopenia   . Hypertension   . Sleep apnea     CPAP  . GERD (gastroesophageal reflux disease)   . Atrial fibrillation     a. Pradaxa started 01/2012;  b .s/p TEE/DCCV 02/16/2012; c. recurrent afib->Tikosyn Initiation 03/2012  . Nonischemic cardiomyopathy     a. 01/2012 Echo: EF 35-40%, Diff HK, mild MR;  b. 01/2012 R&L Heart Cath: mildly elevated R heart pressures (pcwp 17) , nl cors.  . Chronic systolic CHF (congestive heart failure)     a. 01/2012 Echo: EF 35-40%, Diff HK, mild MR  . Hematuria     a. 01/2012 - to f/u with urology as outpt.    Past Surgical History  Procedure Date  . Gastric bypass 2004   . Umbilical hernia repair     x 2  . Hernia repair   . Tee without cardioversion 02/16/2012    Procedure: TRANSESOPHAGEAL ECHOCARDIOGRAM (TEE);  Surgeon: [name], MD;  Location: [facility] ENDOSCOPY;  Service: Cardiovascular;  Laterality: N/A;  10a CV  . Cardioversion 02/16/2012    Procedure: CARDIOVERSION;  Surgeon: [name], MD;  Location: [name] ENDOSCOPY;  Service: Cardiovascular;  Laterality: N/A;  . Cardioversion 04/22/2012    Procedure: CARDIOVERSION;  Surgeon: [name], MD;  Location: [facility] ENDOSCOPY;  Service: Cardiovascular;  Laterality: N/A;    Family History  Problem Relation Age of Onset  . Hypertension Mother   . Hypertension Father   . Heart attack Father 51    Died  . Diabetes Father   . Kidney disease Father     with transplant   Social History:  reports that he has never smoked. He quit smokeless tobacco use about 9 years ago. His smokeless tobacco use included Chew. He reports that he drinks alcohol. His drug history not on file.  Allergies: No Known Allergies  Medications:    Current Facility-Administered Medications  Medication Dose Route Frequency Provider Last Rate Last Dose  . 0.9 %  sodium chloride infusion  20 mL Intravenous Continuous Sunnie Nielsen, MD       Current Outpatient Prescriptions  Medication Sig Dispense Refill  . amiodarone (PACERONE) 400 MG tablet Take 1 tablet (400 mg total) by mouth 2 (two) times daily. Take 1 tablet (400 mg) twice daily for 14 days, then take 1 tablet (400 mg) once daily thereafter.  60 tablet  3  . calcium citrate (CALCITRATE - DOSED IN MG ELEMENTAL CALCIUM) 950 MG tablet Take 1 tablet by mouth daily.      . Cyanocobalamin (VITAMIN B12 PO) Take 1 tablet by mouth daily.       . dabigatran (PRADAXA) 150 MG CAPS Take 1 capsule (150 mg total) by mouth every 12 (twelve) hours.  60 capsule  6  . furosemide (LASIX) 40 MG tablet Take 1 tablet (40 mg total) by mouth 2 (two) times daily.  60 tablet  6  . losartan (COZAAR)  50 MG tablet Take 1 tablet (50 mg total) by mouth 2 (two) times daily.  60 tablet  6  . metoprolol tartrate (LOPRESSOR) 25 MG tablet Take 2 tablets (50 mg total) by mouth 2 (two) times daily.  120 tablet  6  . Multiple Vitamin (MULTIVITAMIN) tablet Take 3 tablets by mouth daily.       Marland Kitchen spironolactone (ALDACTONE) 50 MG tablet Take 0.5 tablets (25 mg total) by mouth daily.  30 tablet  6  . vitamin E (VITAMIN E) 400 UNIT capsule Take 400 Units by mouth daily.         ROS: History obtained from the patient  General ROS: negative for - chills, fatigue, fever, night sweats, weight gain or weight loss Psychological ROS: negative for - behavioral disorder, hallucinations, memory difficulties, mood swings or suicidal ideation Ophthalmic ROS: negative for - blurry vision, double vision, eye pain or loss of vision ENT ROS: negative for - epistaxis, nasal discharge, oral lesions, sore throat, tinnitus or vertigo Allergy and Immunology ROS: negative for - hives or itchy/watery eyes Hematological and Lymphatic ROS: negative for - bleeding problems, bruising or swollen lymph nodes Endocrine ROS: negative for - galactorrhea, hair pattern changes, polydipsia/polyuria or temperature intolerance Respiratory ROS: negative for - cough, hemoptysis, shortness of breath or wheezing Cardiovascular ROS: negative for - chest pain, dyspnea on exertion, edema or irregular heartbeat Gastrointestinal ROS: negative for - abdominal pain, diarrhea, hematemesis, nausea/vomiting or stool incontinence Genito-Urinary ROS: negative for - dysuria, hematuria, incontinence or urinary frequency/urgency Musculoskeletal ROS: negative for - joint swelling or muscular weakness Neurological ROS: as noted in HPI Dermatological ROS: negative for rash and skin lesion changes   Physical Examination: Blood pressure 121/70, pulse 44, temperature 97.7 F (36.5 C), temperature source Oral, resp. rate 11, SpO2 99.00%.  Neurologic  Examination:  Mental Status: Alert, oriented, thought content appropriate.  Speech fluent without evidence of aphasia.  Able to follow 3 step commands without difficulty. Cranial Nerves: II: Discs flat bilaterally; Visual fields grossly normal, pupils equal, round, reactive to light and accommodation III,IV, VI: ptosis not present, extra-ocular motions intact bilaterally V,VII: smile symmetric, facial light touch sensation normal bilaterally VIII: hearing normal bilaterally IX,X: gag reflex present XI: bilateral shoulder shrug XII: midline tongue extension Motor: Right : Upper extremity   5/5    Left:     Upper extremity   5/5  Lower extremity   5/5     Lower extremity   5/5 Tone and bulk:normal tone throughout; no atrophy noted Sensory: Pinprick and light touch intact  throughout, bilaterally Deep Tendon Reflexes: 2+ UE bil, 1+ KJ, and 1+ AJ bilaterally Plantars: Right: downgoing   Left: downgoing Cerebellar: normal finger-to-nose,  normal heel-to-shin test CV: pulses palpable throughout     Lab Results  Component Value Date/Time   CHOL 153 05/01/2012  1:01 AM    Results for orders placed during the hospital encounter of 05/01/12 (from the past 48 hour(s))  CBC WITH DIFFERENTIAL     Status: Abnormal   Collection Time   05/01/12 12:10 AM      Component Value Range Comment   WBC 5.9  4.0 - 10.5 K/uL    RBC 3.85 (*) 4.22 - 5.81 MIL/uL    Hemoglobin 11.8 (*) 13.0 - 17.0 g/dL    HCT 16.1 (*) 09.6 - 52.0 %    MCV 91.4  78.0 - 100.0 fL    MCH 30.6  26.0 - 34.0 pg    MCHC 33.5  30.0 - 36.0 g/dL    RDW 04.5  40.9 - 81.1 %    Platelets 145 (*) 150 - 400 K/uL    Neutrophils Relative 45  43 - 77 %    Neutro Abs 2.7  1.7 - 7.7 K/uL    Lymphocytes Relative 44  12 - 46 %    Lymphs Abs 2.6  0.7 - 4.0 K/uL    Monocytes Relative 8  3 - 12 %    Monocytes Absolute 0.5  0.1 - 1.0 K/uL    Eosinophils Relative 2  0 - 5 %    Eosinophils Absolute 0.1  0.0 - 0.7 K/uL    Basophils Relative 1  0  - 1 %    Basophils Absolute 0.0  0.0 - 0.1 K/uL   COMPREHENSIVE METABOLIC PANEL     Status: Abnormal   Collection Time   05/01/12 12:10 AM      Component Value Range Comment   Sodium 137  135 - 145 mEq/L    Potassium 3.8  3.5 - 5.1 mEq/L    Chloride 103  96 - 112 mEq/L    CO2 26  19 - 32 mEq/L    Glucose, Bld 97  70 - 99 mg/dL    BUN 13  6 - 23 mg/dL    Creatinine, Ser 9.14  0.50 - 1.35 mg/dL    Calcium 8.6  8.4 - 78.2 mg/dL    Total Protein 6.6  6.0 - 8.3 g/dL    Albumin 3.8  3.5 - 5.2 g/dL    AST 14  0 - 37 U/L    ALT 20  0 - 53 U/L    Alkaline Phosphatase 83  39 - 117 U/L    Total Bilirubin 0.5  0.3 - 1.2 mg/dL    GFR calc non Af Amer 63 (*) >90 mL/min    GFR calc Af Amer 73 (*) >90 mL/min   PROTIME-INR     Status: Abnormal   Collection Time   05/01/12  1:01 AM      Component Value Range Comment   Prothrombin Time 15.8 (*) 11.6 - 15.2 seconds    INR 1.29  0.00 - 1.49   APTT     Status: Normal   Collection Time   05/01/12  1:01 AM      Component Value Range Comment   aPTT 34  24 - 37 seconds   LIPID PANEL     Status: Normal   Collection Time   05/01/12  1:01 AM  Component Value Range Comment   Cholesterol 153  0 - 200 mg/dL    Triglycerides 56  <161 mg/dL    HDL 62  >09 mg/dL    Total CHOL/HDL Ratio 2.5      VLDL 11  0 - 40 mg/dL    LDL Cholesterol 80  0 - 99 mg/dL   POCT I-STAT TROPONIN I     Status: Normal   Collection Time   05/01/12  7:35 AM      Component Value Range Comment   Troponin i, poc 0.00  0.00 - 0.08 ng/mL    Comment 3              Ct Head Wo Contrast  05/01/2012  *RADIOLOGY REPORT*  Clinical Data: 46 year old male with slurred speech and altered mental status.  CT HEAD WITHOUT CONTRAST  Technique:  Contiguous axial images were obtained from the base of the skull through the vertex without contrast.  Comparison: None.  Findings: Visualized paranasal sinuses and mastoids are clear. Visualized orbits and scalp soft tissues are within normal limits. No  acute osseous abnormality identified.  Cerebral volume is within normal limits for age.  No midline shift, ventriculomegaly, mass effect, evidence of mass lesion, intracranial hemorrhage or evidence of cortically based acute infarction.  Gray-white matter differentiation is within normal limits throughout the brain.  No suspicious intracranial vascular hyperdensity.  IMPRESSION: Normal noncontrast CT appearance of the brain.   Original Report Authenticated By: Harley Hallmark, M.D.    Mr Brain Wo Contrast  05/01/2012  *RADIOLOGY REPORT*  Clinical Data: Slurred speech.  Left facial droop and left-sided weakness.  MRI HEAD WITHOUT CONTRAST  Technique:  Multiplanar, multiecho pulse sequences of the brain and surrounding structures were obtained according to standard protocol without intravenous contrast.  Comparison: CT 05/01/2012  Findings: Small area of acute infarction in the right parietal cortex.  No associated hemorrhage.  Small area of chronic infarct in the right frontal white matter. No other significant ischemic change.  Brainstem and cerebellum are normal.  Basal ganglia is intact.  Empty sella is noted with a small pituitary gland in the floor of the sella.  The vessels at the base of the brain are patent.  Paranasal sinuses are clear.  IMPRESSION: Small area of acute infarction right parietal cortex.   Original Report Authenticated By: Camelia Phenes, M.D.    TEE 7-13 Study Conclusions  - Left ventricle: Systolic function was mildly to moderately reduced. The estimated ejection fraction was in the range of 40% to 45%. Diffuse hypokinesis. - Aortic valve: Trivial regurgitation. - Mitral valve: No evidence of vegetation. No evidence of vegetation. Mild regurgitation. - Left atrium: The atrium was moderately dilated. No evidence of thrombus in the atrial cavity or appendage. - Right ventricle: The cavity size was mildly dilated. Systolic function was mildly reduced. - Right atrium: The atrium  was moderately dilated. - Atrial septum: No defect or patent foramen ovale was identified. - Tricuspid valve: No evidence of vegetation. - Pericardium, extracardiac: A trivial pericardial effusion was identified.  Carotid Doppler: Preliminary report: Bilateral: No evidence of hemodynamically significant internal carotid artery stenosis. Vertebral artery flow is antegrade  EKG NSR  Assessment:    46 y.o. male with acute right parietal cortex infarct. Likely cardio embolic given his history of A-Fib with recent cardioversion, LV diffuse hypokinesis and EF 40-45% (TEE 01-2012) and location of infarct. Patient is currently on Paradaxa. Cardiology has been notified.   Stroke Risk Factors -  atrial fibrillation  PLAN: 1. HgbA1c, fasting lipid panel  2. Risk factor modification 3. Once cardiology has evaluated the patient decisions will be made concerning further medical management.    Felicie Morn PA-C [facility] [phone]  05/01/2012, 9:59 AM    Patient seen and examined.  Clinical course and management discussed.  Necessary edits performed.  I agree with the above.  Thana Farr, MD [facility] [phone]  05/01/2012  11:19 AM

## 2012-05-01 NOTE — ED Notes (Addendum)
SPOUSE REPORTS BRIEF EPISODE OF APHASIA / LEFT FACIAL ASYMMETRY AT HOME THIS EVENING , ALERT AND ORIENTED AT ARRIVAL , EQUAL STRONG GRIPS , NO ARM DRIFT , AMBULATORY. SPEECH CLEAR WITH NO FACIAL ASYMMETRY.

## 2012-05-02 DIAGNOSIS — I495 Sick sinus syndrome: Secondary | ICD-10-CM

## 2012-05-02 DIAGNOSIS — E876 Hypokalemia: Secondary | ICD-10-CM

## 2012-05-02 DIAGNOSIS — I428 Other cardiomyopathies: Secondary | ICD-10-CM

## 2012-05-02 DIAGNOSIS — K2100 Gastro-esophageal reflux disease with esophagitis, without bleeding: Secondary | ICD-10-CM

## 2012-05-02 LAB — T4, FREE: Free T4: 1.11 ng/dL (ref 0.80–1.80)

## 2012-05-02 LAB — TSH: TSH: 2.518 u[IU]/mL (ref 0.350–4.500)

## 2012-05-02 MED ORDER — RIVAROXABAN 20 MG PO TABS: 20.0000 mg | ORAL_TABLET | Freq: Every day | ORAL | Status: DC

## 2012-05-02 NOTE — Care Management Note (Signed)
    Page 1 of 1   05/02/2012     11:10:37 AM   CARE MANAGEMENT NOTE 05/02/2012  Patient:  Tyrone [name], Tyrone [name]   Account Number:  [id number]  Date Initiated:  05/02/2012  Documentation initiated by:  Donn Pierini  Subjective/Objective Assessment:   Pt admitted with CVA     Action/Plan:   PTA pt lived at home with spouse   Anticipated DC Date:  05/02/2012   Anticipated DC Plan:  HOME/SELF CARE      DC Planning Services  CM consult      Choice offered to / List presented to:             Status of service:  Completed, signed off Medicare Important Message given?   (If response is "NO", the following Medicare IM given date fields will be blank) Date Medicare IM given:   Date Additional Medicare IM given:    Discharge Disposition:  HOME/SELF CARE  Per UR Regulation:  Reviewed for med. necessity/level of care/duration of stay  If discussed at Long Length of Stay Meetings, dates discussed:    Comments:  05/02/12- 1000- Donn Pierini RN, BSN [phone] Pt for d/c home today with Xarelto- spoke with pt at bedside- pharmacy of choice is [facility] in [place]- call made to pharmacy- who does not have Xarelto 20 mg in stock but can order today to have in stock tomorrow 05/03/12- benefits check completed and pt has  copay of $40 for 30/day supply with a $120 copay for 90 day supply or mail order. Pt given copay info along with info on [facility] supply. Pt given option to go to different pharmacy- but would like to stay with [facility]- prescriptions to be sent electronically per MD- call made back to [facility] to confirm that drug will be ordered today to have tomorrow- pt has received dose already for today. Pt aware the importance to get drug tomorrow.

## 2012-05-02 NOTE — Progress Notes (Signed)
Reviewed discharge instructions with patient and his wife, they stated their understanding.  Patient given education handout about Atrial Arrythmia Ablation, which is planned for a month.  Patient discharged via wheelchair and volunteer.  Colman Cater

## 2012-05-02 NOTE — Progress Notes (Signed)
Patient Name: Tyrone [name]      SUBJECTIVE: events noted.  Discussion with Dr Pearlean Brownie yday raised his concerns about  dabigtran and cardioversion and i refer him to Circ 123: 131-6;2011  Prev EF 40-45>>>65% hooray  On Rivaroxaban as alternative to  dabigitran   mopstly recovered  Past Medical History  Diagnosis Date  . External thrombosed hemorrhoids   . Chest pain   . Osteoarthrosis, unspecified whether generalized or localized, ankle and foot     of the knees,ankle and foot  . Guillain-Barre syndrome   . Cervicalgia   . Vitamin B 12 deficiency   . Urticaria, unspecified   . Sinoatrial node dysfunction   . Morbid obesity s/p gastric bypass   . Hernia of unspecified site of abdominal cavity without mention of obstruction or gangrene     of abdominal  . Unspecified intestinal malabsorption   . Pancytopenia   . Hypertension   . Sleep apnea     CPAP  . GERD (gastroesophageal reflux disease)   . Atrial fibrillation     a. Pradaxa started 01/2012;  b .s/p TEE/DCCV 02/16/2012; c. recurrent afib->Tikosyn Initiation 03/2012  . Nonischemic cardiomyopathy     a. 01/2012 Echo: EF 35-40%, Diff HK, mild MR;  b. 01/2012 R&L Heart Cath: mildly elevated R heart pressures (pcwp 17) , nl cors.  . Chronic systolic CHF (congestive heart failure)     a. 01/2012 Echo: EF 35-40%, Diff HK, mild MR  . Hematuria     a. 01/2012 - to f/u with urology as outpt.    PHYSICAL EXAM Filed Vitals:   05/02/12 0200 05/02/12 0400 05/02/12 0600 05/02/12 0800  BP: 99/52 99/59 120/64 123/81  Pulse:  40 43 40  Temp:   97.5 F (36.4 C)   TempSrc:   Oral   Resp:      SpO2:   99%    Well developed and nourished in no acute distress HENT normal Neck supple with JVP-flat Carotids brisk and full without bruits Clear Regular rate and rhythm, 2/6 murmur Abd-soft with active BS without hepatomegaly No Clubbing cyanosis edema Skin-warm and dry A & Oriented  Grossly normal sensory and motor function   TELEMETRY:  Reviewed telemetry pt in sinus brady   Intake/Output Summary (Last 24 hours) at 05/02/12 0806 Last data filed at 05/01/12 2126  Gross per 24 hour  Intake      3 ml  Output      0 ml  Net      3 ml    LABS: Basic Metabolic Panel:  Lab 05/01/12 1610  NA 137  K 3.8  CL 103  CO2 26  GLUCOSE 97  BUN 13  CREATININE 1.31  CALCIUM 8.6  MG --  PHOS --   Cardiac Enzymes: No results found for this basename: CKTOTAL:3,CKMB:3,CKMBINDEX:3,TROPONINI:3 in the last 72 hours CBC:  Lab 05/01/12 0010  WBC 5.9  NEUTROABS 2.7  HGB 11.8*  HCT 35.2*  MCV 91.4  PLT 145*   PROTIME:  Basename 05/01/12 0101  LABPROT 15.8*  INR 1.29   Liver Function Tests:  Basename 05/01/12 0010  AST 14  ALT 20  ALKPHOS 83  BILITOT 0.5  PROT 6.6  ALBUMIN 3.8   No results found for this basename: LIPASE:2,AMYLASE:2 in the last 72 hours BNP: BNP (last 3 results)  Basename 02/11/12 1230  PROBNP 1106.0*   D-Dimer: No results found for this basename: DDIMER:2 in the last 72 hours Hemoglobin A1C:  Basename 05/01/12  0101  HGBA1C 5.6   Fasting Lipid Panel:  Basename 05/01/12 0101  CHOL 153  HDL 62  LDLCALC 80  TRIG 56  CHOLHDL 2.5  LDLDIRECT --   Thyroid Function Tests:  Basename 05/01/12 1622  TSH 2.518  T4TOTAL --  T3FREE --  THYROIDAB --     ASSESSMENT AND PLAN:  Patient Active Hospital Problem List: Cerebral embolism with cerebral infarction (05/01/2012)   Nonischemic cardiomyopathy ()   Atrial fibrillation ()   Sinoatrial node dysfunction/bradycardia ()   Chronic combined systolic and diastolic heart failure (04/07/2012)   Will stop lopressor Continue amio Continue Rivaroxaban as opposed to dabigitran D/c when ready  We will follow for now with no other changes in plans   Signed, Sherryl Manges MD  05/02/2012 \

## 2012-05-02 NOTE — Progress Notes (Signed)
Stroke Team Progress Note  HISTORY Tyrone [name] is an 46 y.o. male with known A-fib on Pradaxa and recently underwent cardioversion 1 week ago. Patient was his normal state until last night 04/30/2012 at 10:30. He was sleeping and awoke coughing loudly. His wife came to his side and and noted a left facial droop and that he was not responding. He could understand her but had difficulty expressing himself for about 1 minute. Since then he has felt his left arm is heavy feeling but no other deficits. He was brought to ED where CT head negative but MRI shows a right parietal infarct. Per patient and wife, he is scheduled for a trial ablation in November.   Patient was not a TPA candidate secondary to delay in arrival. He was admitted  for further evaluation and treatment.  SUBJECTIVE His wife is at the bedside.  Overall he feels his condition is gradually improving. He still has some tingling in his left arm. PT to work with momentarily.  OBJECTIVE Most recent Vital Signs: Filed Vitals:   05/02/12 0015 05/02/12 0200 05/02/12 0400 05/02/12 0600  BP: 100/68 99/52 99/59  120/64  Pulse:   40 43  Temp:    97.5 F (36.4 C)  TempSrc:    Oral  Resp:      SpO2:    99%   Intake/Output from previous day: 10/09 0701 - 10/10 0700 In: 3 [I.V.:3] Out: -   IV Fluid Intake:     . DISCONTD: sodium chloride      MEDICATIONS    . amiodarone  400 mg Oral Daily  . calcium citrate  1 tablet Oral Daily  . furosemide  40 mg Oral BID  . losartan  50 mg Oral BID  . metoprolol tartrate  25 mg Oral BID  . multivitamin with minerals  1 tablet Oral Daily  . rivaroxaban  20 mg Oral Daily  . sodium chloride  3 mL Intravenous Q12H  . spironolactone  25 mg Oral Daily  . vitamin E  400 Units Oral Daily  . DISCONTD: metoprolol tartrate  50 mg Oral BID  . DISCONTD: multivitamin  3 tablet Oral Daily   PRN:  acetaminophen, acetaminophen  Diet:  Cardiac thin liquids Activity:  Bathroom privileges with  assistance DVT Prophylaxis:  SCDs, pradaxa  CLINICALLY SIGNIFICANT STUDIES Basic Metabolic Panel:  Lab 05/01/12 1610  NA 137  K 3.8  CL 103  CO2 26  GLUCOSE 97  BUN 13  CREATININE 1.31  CALCIUM 8.6  MG --  PHOS --   Liver Function Tests:  Lab 05/01/12 0010  AST 14  ALT 20  ALKPHOS 83  BILITOT 0.5  PROT 6.6  ALBUMIN 3.8   CBC:  Lab 05/01/12 0010  WBC 5.9  NEUTROABS 2.7  HGB 11.8*  HCT 35.2*  MCV 91.4  PLT 145*   Coagulation:  Lab 05/01/12 0101  LABPROT 15.8*  INR 1.29   Cardiac Enzymes: No results found for this basename: CKTOTAL:3,CKMB:3,CKMBINDEX:3,TROPONINI:3 in the last 168 hours Urinalysis: No results found for this basename: COLORURINE:2,APPERANCEUR:2,LABSPEC:2,PHURINE:2,GLUCOSEU:2,HGBUR:2,BILIRUBINUR:2,KETONESUR:2,PROTEINUR:2,UROBILINOGEN:2,NITRITE:2,LEUKOCYTESUR:2 in the last 168 hours Lipid Panel    Component Value Date/Time   CHOL 153 05/01/2012 0101   TRIG 56 05/01/2012 0101   HDL 62 05/01/2012 0101   CHOLHDL 2.5 05/01/2012 0101   VLDL 11 05/01/2012 0101   LDLCALC 80 05/01/2012 0101   HgbA1C  Lab Results  Component Value Date   HGBA1C 5.6 05/01/2012    Urine Drug Screen:   No results  found for this basename: labopia, cocainscrnur, labbenz, amphetmu, thcu, labbarb    Alcohol Level: No results found for this basename: ETH:2 in the last 168 hours   CT of the brain  05/01/2012   Normal noncontrast CT appearance of the brain.    MRI of the brain  05/01/2012   Small area of acute infarction right parietal cortex.     MRA of the brain    2D Echocardiogram  EF 60-65% with no source of embolus.   Carotid Doppler  No evidence of hemodynamically significant internal carotid artery stenosis. Vertebral artery flow is antegrade.   TEE 02/16/2012 40% to 45%. Diffuse hypokinesis. No PFO. No SOE.  EKG  sinus bradycardia, incomplete RBBB.   Therapy Recommendations PT - ; OT - ; ST -   Physical Exam   Pleasant middle aged Caucasian male not in  distress.Awake  Alert oriented x 3. Normal speech and language.eye movements full without nystagmus.Fundi not visualized. Vision acuity and fields appear normal. Face symmetric. Tongue midline. Normal strength, tone, reflexes and coordination. Normal sensation. Gait deferred.  ASSESSMENT Tyrone [name] is a 46 y.o. male presenting with left facial droop and expressive aphasia. Imaging confirms a right parietal cortical infarct. Infarct felt to be embolic secondary to known atrial fibrillation.  Work up underway. On pradaxa prior to admission. Now on pradaxa for secondary stroke prevention. Patient with resultant left hemisensory deficit.  Obesity Hypertension Sleep apnea, CPAP at home atrial fibrillation  Hospital day # 1  TREATMENT/PLAN  Continue pradaxa for secondary stroke prevention.  Ok for discharge from stroke standpoint  F/u Dr. Pearlean Brownie in 2 mos, stroke clinic, call for appt.  D/w Dr [name] and Short  Annie Main, MSN, RN, ANVP-BC, ANP-BC, GNP-BC [facility] Pager: [phone] 05/02/2012 7:33 AM  Scribe for Dr. Delia Heady, Stroke Center Medical Director, who has personally reviewed chart, pertinent data, examined the patient and developed the plan of care. Pager:  [phone]

## 2012-05-02 NOTE — Discharge Summary (Signed)
Physician Discharge Summary  Tyrone [name] ZOX:[id number] DOB: [birth year] DOA: 05/01/2012  PCP: Tyrone Peri, MD  Admit date: 05/01/2012 Discharge date: 05/02/2012  Recommendations for Outpatient Follow-up:  1. Patient was discharged from PT services 2. Follow up with primary care doctor within 1 week for heart rate and blood pressure check and medication titration as needed.  Outpatient evaluation for normocytic anemia.   3. Follow up with cardiology at previously scheduled appointments 4. Follow up with Dr. Pearlean Brownie, Neurology, in stroke clinic in 2 months, call for appointment  Discharge Diagnoses:  Principal Problem:  *Cerebral embolism with cerebral infarction Active Problems:  Nonischemic cardiomyopathy  Atrial fibrillation  Sinoatrial node dysfunction/bradycardia  Chronic combined systolic and diastolic heart failure   Discharge Condition: stable, improved  Diet recommendation: Healthy heart  Wt Readings from Last 3 Encounters:  05/02/12 106.641 kg (235 lb 1.6 oz)  04/22/12 106.142 kg (234 lb)  04/22/12 106.142 kg (234 lb)    History of present illness:   Tyrone [name] is a very pleasant 46yo male hx of afib on amiodorone and Pradaxa, chronic systolic CHF, non-ischemic cardiomyopathy, sleep apnea, HTN, GERD presents to ED from home cc left facial droop/left arm numbness. Info obtained from pt and wife who is at bedside. Pt reports feeling better than usual since his most recent cardioversion in sept until last evening while sitting in recliner watching TV he suddenly experienced a loud unusual sounding cough. Wife states she approached pt to check on him and he reports remembering hearing her but unable to respond to her questions. He states he was unable to talk and felt his left forearm go numb to his thumb. Wife reports left corner of mouth was "drawn down" and his speech was slurred. She also reports that he got a "glazed look" on his face and was non responsive. This episode  reportedly lasted approximately 2 minutes. States symptoms began to resolve almost immediately. Currently only symptom remaining is some slight left hand/arm weakness. Denies pain, nausea vomiting. Denies headache. Does report some minor visual disturbances over last few weeks and has apt with eye doctor in November. No recent illness, fever. Work up in ED includes MRI which yields small area of acute infarction right parietal cortex. Symptoms came on suddenly, have resolved and characterized as moderate.    Hospital Course:   Cerebral embolism with cerebral infarction.  Head CT was normal on 10/9, however, follow up MRI brain demonstrated a small area of acute infarction in the right parietal cortex.  Given the timing of the event, it is likely that he experienced an embolic stroke secondary to his cardioversion despite anticoagulation.  TEE from 7/26 had previously demonstrated a mildly depressed EF of 40-45% with no PFO and diffuse hypokinesis, however, TTE during admission demonstarted a normal ejection fraction with a mildly dilated left atrium.  Carotid dopplers showed no stenosis.  His symptoms rapidly improved and he was asymptomatic at the time of discharge.  He should continue xarelto for secondary stroke prevention.  He was evaluated by PT who discharged him.    Atrial fibrillation status post ablation with asymptomatic bradycardia with RBBB.  [name] was admitted to telemetry where he was found to have asymptomatic bradycardia with RBBB.  He was seen by cardiology who recommended stopping his beta blocker, which was done on the day of discharge.  His amiodarone was continued.   He should have a blood pressure and heart rate check by his primary care doctor within one week.  For anticoagulation, he should stop pradaxa and start xarelto 20mg  daily for stroke prevention per Neurology and Cardiology.   Hyperlipidemia:  At goal.  Continue statin.   Lab Results  Component Value Date   CHOL 153  05/01/2012   HDL 62 05/01/2012   LDLCALC 80 05/01/2012   TRIG 56 05/01/2012   CHOLHDL 2.5 05/01/2012    Nonischemic cardiomyopathy appears to be : echo yields EF 60%.   His metoprolol was discontinued by cardiology secondary to severe bradycardia, however, he should continue lasix, losartan, and aldactone until he follows up with cardiology.    OSA, stable.  He had a mildly depressed TSH during admission which may have been due to acute illness/hospitalization, however, he is on amiodarone.   He should have a repeat TSH in 2-4 weeks by his primary care doctor to ensure thyroid function has normalized.    Procedures:  CT head 10/9  MRI brain 10/9  Carotid duplex 10/9  TTE 10/9  Consultations:  Neurology  Cardiology  Discharge Exam: Filed Vitals:   05/02/12 0800  BP: 123/81  Pulse: 40  Temp:   Resp:    Filed Vitals:   05/02/12 0400 05/02/12 0600 05/02/12 0800 05/02/12 0916  BP: 99/59 120/64 123/81   Pulse: 40 43 40   Temp:  97.5 F (36.4 C)    TempSrc:  Oral    Resp:      Height:    5\' 9"  (1.753 m)  Weight:    106.641 kg (235 lb 1.6 oz)  SpO2:  99%      General: Caucasian male, no acute distress, sitting at edge of bed comfortable HEENT:  MMM Cardiovascular: Bradycardic, regular rhythm, no m/r/g, 2+ pulses Respiratory: CTAB ABD:  Soft Neuro:  No facial droop, tongue midline, strength 5/5 throughout, sensation intact to light touch.    Discharge Instructions      Discharge Orders    Future Appointments: Provider: Department: Dept Phone: Center:   05/15/2012 8:30 AM Beatrice Lecher, PA [facility] [phone] [facility]   05/27/2012 9:00 AM Hillis Range, MD [facility] [phone] [facility]     Future Orders Please Complete By Expires   Diet - low sodium heart healthy      Increase activity slowly      Discharge instructions      Comments:   You were hospitalized with a stroke after cardioversion despite being on  anticoagulation.  Please stop pradaxa and start xarelto to thin the blood and prevent future strokes.  You had a head CT which was normal and a brain MRI which showed a small stroke.  Your symptoms improved.  You had an ultrasound of your neck arteries, which was normal, and an ultrasound of your heart which shows the function of your heart is improved since three months ago.  You were seen by neurology who would like to see you in clinic for follow up in 2 months.  Cardiology also consulted and recommended stopping your metoprolol because of your slow heart rate.  Please continue your amiodarone once daily as you previously were and continue your other home medications.  Please have your primary care doctor check your blood pressure and heart rate in clinic in 1 week and follow up with cardiology at your already scheduled appointments.   Call MD for:      Comments:   Call 911 if you have chest pain, shortness of breath, slurred speech, confusion, facial droop, or weakness or numbness of an  arm or leg.   Call MD for:  temperature >100.4      Call MD for:  persistant nausea and vomiting      Call MD for:  severe uncontrolled pain      Call MD for:  difficulty breathing, headache or visual disturbances      Call MD for:  hives      Call MD for:  extreme fatigue      Call MD for:  persistant dizziness or light-headedness      (HEART FAILURE PATIENTS) Call MD:  Anytime you have any of the following symptoms: 1) 3 pound weight gain in 24 hours or 5 pounds in 1 week 2) shortness of breath, with or without a dry hacking cough 3) swelling in the hands, feet or stomach 4) if you have to sleep on extra pillows at night in order to breathe.          Medication List     As of 05/02/2012 10:25 AM    STOP taking these medications         dabigatran 150 MG Caps   Commonly known as: PRADAXA      metoprolol tartrate 25 MG tablet   Commonly known as: LOPRESSOR      TAKE these medications          amiodarone 400 MG tablet   Commonly known as: PACERONE   Take 1 tablet (400 mg total) by mouth 2 (two) times daily. Take 1 tablet (400 mg) twice daily for 14 days, then take 1 tablet (400 mg) once daily thereafter.      calcium citrate 950 MG tablet   Commonly known as: CALCITRATE - dosed in mg elemental calcium   Take 1 tablet by mouth daily.      furosemide 40 MG tablet   Commonly known as: LASIX   Take 1 tablet (40 mg total) by mouth 2 (two) times daily.      losartan 50 MG tablet   Commonly known as: COZAAR   Take 1 tablet (50 mg total) by mouth 2 (two) times daily.      multivitamin tablet   Take 3 tablets by mouth daily.      Rivaroxaban 20 MG Tabs   Commonly known as: XARELTO   Take 1 tablet (20 mg total) by mouth daily.      spironolactone 50 MG tablet   Commonly known as: ALDACTONE   Take 0.5 tablets (25 mg total) by mouth daily.      VITAMIN B12 PO   Take 1 tablet by mouth daily.      vitamin E 400 UNIT capsule   Generic drug: vitamin E   Take 400 Units by mouth daily.        Follow-up Information    Follow up with Gates Rigg, MD. Schedule an appointment as soon as possible for a visit in 2 months. (stroke clinic)    Contact information:   [address] [facility] [place] [address] [phone]       Follow up with [facility], MD. Schedule an appointment as soon as possible for a visit in 1 week. (heart rate and blood pressure check)    Contact information:   [address]  [place] [address] [phone]           The results of significant diagnostics from this hospitalization (including imaging, microbiology, ancillary and laboratory) are listed below for reference.    Significant Diagnostic Studies:  Ct Head Wo Contrast  05/01/2012  *RADIOLOGY REPORT*  Clinical Data: 46 year old male with slurred speech and altered mental status.  CT HEAD WITHOUT CONTRAST  Technique:  Contiguous axial images were obtained  from the base of the skull through the vertex without contrast.  Comparison: None.  Findings: Visualized paranasal sinuses and mastoids are clear. Visualized orbits and scalp soft tissues are within normal limits. No acute osseous abnormality identified.  Cerebral volume is within normal limits for age.  No midline shift, ventriculomegaly, mass effect, evidence of mass lesion, intracranial hemorrhage or evidence of cortically based acute infarction.  Gray-white matter differentiation is within normal limits throughout the brain.  No suspicious intracranial vascular hyperdensity.  IMPRESSION: Normal noncontrast CT appearance of the brain.   Original Report Authenticated By: Harley Hallmark, M.D.    Mr Brain Wo Contrast  05/01/2012  *RADIOLOGY REPORT*  Clinical Data: Slurred speech.  Left facial droop and left-sided weakness.  MRI HEAD WITHOUT CONTRAST  Technique:  Multiplanar, multiecho pulse sequences of the brain and surrounding structures were obtained according to standard protocol without intravenous contrast.  Comparison: CT 05/01/2012  Findings: Small area of acute infarction in the right parietal cortex.  No associated hemorrhage.  Small area of chronic infarct in the right frontal white matter. No other significant ischemic change.  Brainstem and cerebellum are normal.  Basal ganglia is intact.  Empty sella is noted with a small pituitary gland in the floor of the sella.  The vessels at the base of the brain are patent.  Paranasal sinuses are clear.  IMPRESSION: Small area of acute infarction right parietal cortex.   Original Report Authenticated By: Camelia Phenes, M.D.     Microbiology: No results found for this or any previous visit (from the past 240 hour(s)).   Labs: Basic Metabolic Panel:  Lab 05/01/12 1610  NA 137  K 3.8  CL 103  CO2 26  GLUCOSE 97  BUN 13  CREATININE 1.31  CALCIUM 8.6  MG --  PHOS --   Liver Function Tests:  Lab 05/01/12 0010  AST 14  ALT 20  ALKPHOS 83    BILITOT 0.5  PROT 6.6  ALBUMIN 3.8   No results found for this basename: LIPASE:5,AMYLASE:5 in the last 168 hours No results found for this basename: AMMONIA:5 in the last 168 hours CBC:  Lab 05/01/12 0010  WBC 5.9  NEUTROABS 2.7  HGB 11.8*  HCT 35.2*  MCV 91.4  PLT 145*   Cardiac Enzymes: No results found for this basename: CKTOTAL:5,CKMB:5,CKMBINDEX:5,TROPONINI:5 in the last 168 hours BNP: BNP (last 3 results)  Basename 02/11/12 1230  PROBNP 1106.0*   CBG: No results found for this basename: GLUCAP:5 in the last 168 hours  Time coordinating discharge: 45 minutes  Signed:  Dadrian Ballantine  [facility] 05/02/2012, 10:25 AM

## 2012-05-02 NOTE — Plan of Care (Signed)
Problem: Phase III Progression Outcomes Goal: Rehab Team goals identified Outcome: Not Applicable Date Met:  05/02/12 Patient close to baseline with minimal right hand/arm weakness.

## 2012-05-02 NOTE — Evaluation (Signed)
Physical Therapy Evaluation Patient Details Name: Tyrone [name] MRN: [id number] DOB: 06-Aug-1965 Today's Date: 05/02/2012 Time: 0865-7846 PT Time Calculation (min): 31 min  PT Assessment / Plan / Recommendation Clinical Impression  pt presents with small R Parietal Cortex infarct.  pt moving well and only residual deficit is mild weakness and decreased activity tolerance in L UE.  pt has good family support and pt/wife had lots of questions.  Discussed resumption of activity at home and no further PT needs at this time.      PT Assessment  Patent does not need any further PT services    Follow Up Recommendations  No PT follow up    Does the patient have the potential to tolerate intense rehabilitation      Barriers to Discharge        Equipment Recommendations  None recommended by PT    Recommendations for Other Services     Frequency      Precautions / Restrictions Precautions Precautions: None Restrictions Weight Bearing Restrictions: No   Pertinent Vitals/Pain Denies pain.        Mobility  Bed Mobility Bed Mobility: Not assessed (pt sitting EOB) Transfers Transfers: Sit to Stand;Stand to Sit Sit to Stand: 7: Independent;With upper extremity assist;From bed Stand to Sit: 7: Independent;With upper extremity assist;To bed Ambulation/Gait Ambulation/Gait Assistance: 7: Independent Ambulation Distance (Feet): 250 Feet Assistive device: None Ambulation/Gait Assistance Details: Demos good safety even with balance challenges.   Gait Pattern: Within Functional Limits Stairs: Yes Stairs Assistance: 6: Modified independent (Device/Increase time) Stair Management Technique: One rail Left;Forwards Number of Stairs: 11  Wheelchair Mobility Wheelchair Mobility: No Modified Rankin (Stroke Patients Only) Pre-Morbid Rankin Score: No symptoms Modified Rankin: No significant disability    Shoulder Instructions     Exercises     PT Diagnosis:    PT Problem List:   PT  Treatment Interventions:     PT Goals    Visit Information  Last PT Received On: 05/02/12 Assistance Needed: +1    Subjective Data  Subjective: I'm sorry we have so many questions.   Patient Stated Goal: Home   Prior Functioning  Home Living Lives With: Spouse Available Help at Discharge: Family;Available 24 hours/day (if needed) Type of Home: House Home Access: Stairs to enter [employer] of Steps: 2 Entrance Stairs-Rails: Left Home Layout: One level;Laundry or work area in [employer]: None Prior Function Level of Independence: Independent Able to Take Stairs?: Yes Driving: Yes Communication Communication: No difficulties Dominant Hand: Right    Cognition  Overall Cognitive Status: Appears within functional limits for tasks assessed/performed Arousal/Alertness: Awake/alert Orientation Level: Appears intact for tasks assessed Behavior During Session: [facility] for tasks performed    Extremity/Trunk Assessment Right Upper Extremity Assessment RUE ROM/Strength/Tone: Within functional levels RUE Sensation: WFL - Light Touch RUE Coordination: WFL - gross/fine motor Left Upper Extremity Assessment LUE ROM/Strength/Tone: Deficits LUE ROM/Strength/Tone Deficits: AROM WFL, Strength grossly 4+/5 LUE Sensation: WFL - Light Touch LUE Coordination: WFL - gross/fine motor Right Lower Extremity Assessment RLE ROM/Strength/Tone: Within functional levels RLE Sensation: WFL - Light Touch Left Lower Extremity Assessment LLE ROM/Strength/Tone: Within functional levels LLE Sensation: WFL - Light Touch Trunk Assessment Trunk Assessment: Normal   Balance Balance Balance Assessed: Yes Standardized Balance Assessment Standardized Balance Assessment: Dynamic Gait Index Dynamic Gait Index Level Surface: Normal Change in Gait Speed: Normal Gait with Horizontal Head Turns: Normal Gait with Vertical Head Turns: Normal Gait and Pivot Turn: Normal Step Over  Obstacle:  Normal Step Around Obstacles: Normal Steps: Mild Impairment Total Score: 23   End of Session PT - End of Session Equipment Utilized During Treatment: Gait belt Activity Tolerance: Patient tolerated treatment well Patient left: in bed;with call bell/phone within reach;with family/visitor present (Sitting EOB) Nurse Communication: Mobility status  GP     Sunny Schlein, [place] [phone] 05/02/2012, 9:47 AM

## 2012-05-02 NOTE — Plan of Care (Signed)
Problem: Discharge Progression Outcomes Goal: INR monitor plan established Outcome: Not Applicable Date Met:  05/02/12 ON [place] [facility]

## 2012-05-10 ENCOUNTER — Encounter: Payer: Self-pay | Admitting: Internal Medicine

## 2012-05-10 ENCOUNTER — Ambulatory Visit (INDEPENDENT_AMBULATORY_CARE_PROVIDER_SITE_OTHER): Payer: BC Managed Care – PPO | Admitting: Internal Medicine

## 2012-05-10 VITALS — BP 122/86 | HR 55 | Ht 69.0 in | Wt 236.8 lb

## 2012-05-10 DIAGNOSIS — I6340 Cerebral infarction due to embolism of unspecified cerebral artery: Secondary | ICD-10-CM

## 2012-05-10 DIAGNOSIS — I428 Other cardiomyopathies: Secondary | ICD-10-CM

## 2012-05-10 DIAGNOSIS — I4891 Unspecified atrial fibrillation: Secondary | ICD-10-CM

## 2012-05-10 LAB — CBC WITH DIFFERENTIAL/PLATELET
Basophils Absolute: 0.1 10*3/uL (ref 0.0–0.1)
Basophils Relative: 1.2 % (ref 0.0–3.0)
Eosinophils Absolute: 0.1 10*3/uL (ref 0.0–0.7)
Eosinophils Relative: 1.8 % (ref 0.0–5.0)
HCT: 37.2 % — ABNORMAL LOW (ref 39.0–52.0)
Hemoglobin: 12.2 g/dL — ABNORMAL LOW (ref 13.0–17.0)
Lymphocytes Relative: 34.9 % (ref 12.0–46.0)
Lymphs Abs: 1.5 10*3/uL (ref 0.7–4.0)
MCHC: 32.9 g/dL (ref 30.0–36.0)
MCV: 93.5 fl (ref 78.0–100.0)
Monocytes Absolute: 0.2 10*3/uL (ref 0.1–1.0)
Monocytes Relative: 5.4 % (ref 3.0–12.0)
Neutro Abs: 2.4 10*3/uL (ref 1.4–7.7)
Neutrophils Relative %: 56.7 % (ref 43.0–77.0)
Platelets: 157 10*3/uL (ref 150.0–400.0)
RBC: 3.98 Mil/uL — ABNORMAL LOW (ref 4.22–5.81)
RDW: 13.9 % (ref 11.5–14.6)
WBC: 4.3 10*3/uL — ABNORMAL LOW (ref 4.5–10.5)

## 2012-05-10 LAB — HEPATIC FUNCTION PANEL
ALT: 35 U/L (ref 0–53)
AST: 21 U/L (ref 0–37)
Albumin: 4 g/dL (ref 3.5–5.2)
Alkaline Phosphatase: 72 U/L (ref 39–117)
Bilirubin, Direct: 0.2 mg/dL (ref 0.0–0.3)
Total Bilirubin: 0.8 mg/dL (ref 0.3–1.2)
Total Protein: 6.9 g/dL (ref 6.0–8.3)

## 2012-05-10 LAB — BASIC METABOLIC PANEL
BUN: 18 mg/dL (ref 6–23)
CO2: 29 mEq/L (ref 19–32)
Calcium: 8.9 mg/dL (ref 8.4–10.5)
Chloride: 103 mEq/L (ref 96–112)
Creatinine, Ser: 0.9 mg/dL (ref 0.4–1.5)
GFR: 95.9 mL/min (ref >60.00)
Glucose, Bld: 91 mg/dL (ref 70–99)
Potassium: 4 mEq/L (ref 3.5–5.1)
Sodium: 138 mEq/L (ref 135–145)

## 2012-05-10 LAB — TSH: TSH: 2.53 u[IU]/mL (ref 0.35–5.50)

## 2012-05-10 LAB — T4, FREE: Free T4: 0.98 ng/dL (ref 0.60–1.60)

## 2012-05-10 MED ORDER — AMIODARONE HCL 400 MG PO TABS: 200.0000 mg | ORAL_TABLET | Freq: Every day | ORAL | Status: DC

## 2012-05-10 NOTE — Patient Instructions (Signed)
Your physician has recommended that you have an ablation. Catheter ablation is a medical procedure used to treat some cardiac arrhythmias (irregular heartbeats). During catheter ablation, a long, thin, flexible tube is put into a blood vessel in your groin (upper thigh), or neck. This tube is called an ablation catheter. It is then guided to your heart through the blood vessel. Radio frequency waves destroy small areas of heart tissue where abnormal heartbeats may cause an arrhythmia to start. Please see the instruction sheet given to you today.---will schedule for 06/24/12 with a TEE on 06/24/12  Will call with times   Your physician recommends that you return for lab work today and on 06/18/12 prior to the ablation(do not have to be fasting)  Your physician has recommended you make the following change in your medication:  1) Decrease Amiodarone to 200mg  daily

## 2012-05-12 ENCOUNTER — Encounter: Payer: Self-pay | Admitting: Internal Medicine

## 2012-05-12 NOTE — Progress Notes (Signed)
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  He was initially diagnosed with atrial fibrillation earlier this year after presenting with CHF.  He was found to have a depressed EF.  He was cardioverted 7/13.  He did well initially but developed recurrent atrial fibrillation.  He was hospitalized for initiation of tikosyn but did not tolerate this medicine due to QT prolongation.  He was placed on amiodarone and cardioverted 04/22/12.  He was anticoagulated with pradaxa at the time.  Unfortunately, he returned 05/01/12 with acute CVA.  His PTT at that time was 34.  He was switched from pradaxa to xarelto.  His EF was found to have normalized.   The patient has maintained sinus rhythm with amiodarone 400mg  daily since that time.  He is very concerned about side effects with this medicine.  Today, he denies symptoms of palpitations, chest pain, shortness of breath, orthopnea, PND, lower extremity edema, dizziness, presyncope, syncope, or neurologic sequela. The patient is tolerating medications without difficulties and is otherwise without complaint today.   Past Medical History  Diagnosis Date  . External thrombosed hemorrhoids   . Osteoarthrosis, unspecified whether generalized or localized, ankle and foot     of the knees,ankle and foot  . Guillain-Barre syndrome   . Cervicalgia   . Vitamin B 12 deficiency   . Urticaria, unspecified   . Sinoatrial node dysfunction   . Morbid obesity s/p gastric bypass   . Hernia of unspecified site of abdominal cavity without mention of obstruction or gangrene     of abdominal  . Unspecified intestinal malabsorption   . Pancytopenia   . Hypertension   . Sleep apnea     CPAP compliant  . GERD (gastroesophageal reflux  disease)   . Persistent atrial fibrillation     a. Pradaxa started 01/2012;  b .s/p TEE/DCCV 02/16/2012; c. recurrent afib->Tikosyn Initiation 03/2012  . Nonischemic cardiomyopathy     a. 01/2012 Echo: EF 35-40%, Diff HK, mild MR;  b. 01/2012 R&L Heart Cath: mildly elevated R heart pressures (pcwp 17) , nl cors.  . Hematuria     a. 01/2012 - to f/u with urology as outpt.  . CVA (cerebral infarction) 05/01/12    on pradaxa at time of stroke, switched to xarelto   Past Surgical History  Procedure Date  . Gastric bypass 2004  . Umbilical hernia repair     x 2  . Hernia repair   . Tee without cardioversion 02/16/2012    Procedure: TRANSESOPHAGEAL ECHOCARDIOGRAM (TEE);  Surgeon: [name], MD;  Location[facility] ENDOSCOPY;  Service: Cardiovascular;  Laterality: N/A;  10a CV  . Cardioversion 02/16/2012    Procedure: CARDIOVERSION;  Surgeon: [name], MD;  Location: [facility] ENDOSCOPY;  Service: Cardiovascular;  Laterality: N/A;  . Cardioversion 04/22/2012    Procedure: CARDIOVERSION;  Surgeon: [name], MD;  Location: [facility] ENDOSCOPY;  Service: Cardiovascular;  Laterality: N/A;    Current Outpatient Prescriptions  Medication Sig Dispense Refill  . amiodarone (PACERONE) 400 MG tablet Take 0.5 tablets (200 mg total) by mouth daily.  60 tablet  3  . calcium citrate (CALCITRATE - DOSED IN MG ELEMENTAL CALCIUM) 950 MG tablet Take 1 tablet by mouth daily.      . Cyanocobalamin (VITAMIN B12 PO)  Take 1 tablet by mouth daily.       . furosemide (LASIX) 40 MG tablet Take 1 tablet (40 mg total) by mouth 2 (two) times daily.  60 tablet  6  . losartan (COZAAR) 50 MG tablet Take 1 tablet (50 mg total) by mouth 2 (two) times daily.  60 tablet  6  . Multiple Vitamin (MULTIVITAMIN) tablet Take 3 tablets by mouth daily.       . Rivaroxaban (XARELTO) 20 MG TABS Take 1 tablet (20 mg total) by mouth daily.  30 tablet  3  . spironolactone (ALDACTONE) 50 MG tablet Take 0.5 tablets (25 mg total) by mouth daily.  30  tablet  6  . vitamin E (VITAMIN E) 400 UNIT capsule Take 400 Units by mouth daily.        No Known Allergies  History   Social History  . Marital Status: Married    Spouse Name: N/A    Number of Children: 1  . Years of Education: N/A   Occupational History  . Realtor    Social History Main Topics  . Smoking status: Never Smoker   . Smokeless tobacco: Former Neurosurgeon    Types: Chew    Quit date: 11/11/2002  . Alcohol Use: Yes  . Drug Use: Not on file  . Sexually Active: Yes   Other Topics Concern  . Not on file   Social History Narrative   Lives with wife.  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 Hypertension Mother   . Hypertension Father   . Heart attack Father 70    Died  . Diabetes Father   . Kidney disease Father     with transplant    ROS- All systems are reviewed and negative except as per the HPI above  Physical Exam: Filed Vitals:   05/10/12 1208  BP: 122/86  Pulse: 55  Height: 5\' 9"  (1.753 m)  Weight: 236 lb 12.8 oz (107.412 kg)    GEN- The patient is well appearing, alert and oriented x 3 today.   Head- normocephalic, atraumatic Eyes-  Sclera clear, conjunctiva pink Ears- hearing intact Oropharynx- clear Neck- supple, no JVP Lymph- no cervical lymphadenopathy Lungs- Clear to ausculation bilaterally, normal work of breathing Heart- Regular rate and rhythm, no murmurs, rubs or gallops, PMI not laterally displaced GI- soft, NT, ND, + BS Extremities- no clubbing, cyanosis, or edema MS- no significant deformity or atrophy Skin- no rash or lesion Psych- euthymic mood, full affect Neuro- strength and sensation are intact  Echo- reviewed in epic ekg today reveals sinus rhythm 55 bpm, PR 152, incomplete RBBB, otherwise normal ekg  Assessment and Plan:

## 2012-05-12 NOTE — Assessment & Plan Note (Signed)
The patient has symptomatic persistent atrial fibrillation.  He has failed medical therapy with tikosyn.  Given his prior cardiomyopathy, his only other antiarrhythmic option is felt to be amiodarone.  He is very concerned about risks of this medicine long term. Presently, he is anticoagulated with xarelto.  He recently had a CVA while anticoagulated with pradaxa. Therapeutic strategies for afib including medicine and ablation were discussed in detail with the patient today. Risk, benefits, and alternatives to EP study and radiofrequency ablation for afib were also discussed in detail today. These risks include but are not limited to stroke, bleeding, vascular damage, tamponade, perforation, damage to the esophagus, lungs, and other structures, pulmonary vein stenosis, worsening renal function, and death. The patient understands these risk and wishes to proceed.   Given his recent CVA, I think that we should wait 6-8 weeks before we proceed with ablation. He will continue xarelto and amiodarone 200mg  daily in the interim.

## 2012-05-12 NOTE — Assessment & Plan Note (Signed)
Tachycardia mediated CM Recent echo reveals that EF has recovered

## 2012-05-12 NOTE — Assessment & Plan Note (Signed)
He recently had a acute stroke several days following cardioversion.  Though he had been diligently taking pradaxa, I am concerned that the reason for his stroke may have been inadequate absorption of pradaxa due to his prior bariatric surgery and poor gastric absorption.  This is supported by his PTT of 34. As xarelto is less dependant on gastric absorption, I think that this is a better anticoagulant option long term. We will defer catheter ablation until he is another 6-8 weeks out from his prior stroke.

## 2012-05-13 ENCOUNTER — Telehealth: Payer: Self-pay | Admitting: Internal Medicine

## 2012-05-13 NOTE — Telephone Encounter (Signed)
Walk In pt Form " Attending Physicians Statement" needs Completed Sent to [facility] for Completion 05/13/12/KM

## 2012-05-15 ENCOUNTER — Encounter: Payer: BC Managed Care – PPO | Admitting: Physician Assistant

## 2012-05-16 ENCOUNTER — Encounter: Payer: BC Managed Care – PPO | Admitting: Physician Assistant

## 2012-05-20 ENCOUNTER — Other Ambulatory Visit: Payer: Self-pay | Admitting: *Deleted

## 2012-05-20 ENCOUNTER — Encounter: Payer: Self-pay | Admitting: *Deleted

## 2012-05-20 DIAGNOSIS — I4891 Unspecified atrial fibrillation: Secondary | ICD-10-CM

## 2012-05-20 DIAGNOSIS — Z01812 Encounter for preprocedural laboratory examination: Secondary | ICD-10-CM

## 2012-05-27 ENCOUNTER — Ambulatory Visit: Payer: BC Managed Care – PPO | Admitting: Internal Medicine

## 2012-06-11 ENCOUNTER — Encounter (HOSPITAL_COMMUNITY): Payer: Self-pay | Admitting: Pharmacy Technician

## 2012-06-18 ENCOUNTER — Other Ambulatory Visit (INDEPENDENT_AMBULATORY_CARE_PROVIDER_SITE_OTHER): Payer: BC Managed Care – PPO

## 2012-06-18 DIAGNOSIS — I4891 Unspecified atrial fibrillation: Secondary | ICD-10-CM

## 2012-06-18 DIAGNOSIS — Z01812 Encounter for preprocedural laboratory examination: Secondary | ICD-10-CM

## 2012-06-18 LAB — CBC WITH DIFFERENTIAL/PLATELET
Basophils Absolute: 0 10*3/uL (ref 0.0–0.1)
Basophils Relative: 0.7 % (ref 0.0–3.0)
Eosinophils Absolute: 0.1 10*3/uL (ref 0.0–0.7)
Eosinophils Relative: 2.1 % (ref 0.0–5.0)
HCT: 32.6 % — ABNORMAL LOW (ref 39.0–52.0)
Hemoglobin: 10.6 g/dL — ABNORMAL LOW (ref 13.0–17.0)
Lymphocytes Relative: 36 % (ref 12.0–46.0)
Lymphs Abs: 1.4 10*3/uL (ref 0.7–4.0)
MCHC: 32.7 g/dL (ref 30.0–36.0)
MCV: 96 fl (ref 78.0–100.0)
Monocytes Absolute: 0.2 10*3/uL (ref 0.1–1.0)
Monocytes Relative: 6.1 % (ref 3.0–12.0)
Neutro Abs: 2.2 10*3/uL (ref 1.4–7.7)
Neutrophils Relative %: 55.1 % (ref 43.0–77.0)
Platelets: 138 10*3/uL — ABNORMAL LOW (ref 150.0–400.0)
RBC: 3.4 Mil/uL — ABNORMAL LOW (ref 4.22–5.81)
RDW: 13.5 % (ref 11.5–14.6)
WBC: 4 10*3/uL — ABNORMAL LOW (ref 4.5–10.5)

## 2012-06-18 LAB — BASIC METABOLIC PANEL
BUN: 12 mg/dL (ref 6–23)
CO2: 27 mEq/L (ref 19–32)
Calcium: 8.3 mg/dL — ABNORMAL LOW (ref 8.4–10.5)
Chloride: 105 mEq/L (ref 96–112)
Creatinine, Ser: 0.9 mg/dL (ref 0.4–1.5)
GFR: 95.86 mL/min (ref >60.00)
Glucose, Bld: 106 mg/dL — ABNORMAL HIGH (ref 70–99)
Potassium: 4.8 mEq/L (ref 3.5–5.1)
Sodium: 137 mEq/L (ref 135–145)

## 2012-06-24 ENCOUNTER — Encounter (HOSPITAL_COMMUNITY)
Admission: RE | Disposition: A | Discharge status: Home / Self Care | Payer: Self-pay | Source: Ambulatory Visit | Attending: Cardiology

## 2012-06-24 ENCOUNTER — Encounter (HOSPITAL_COMMUNITY): Payer: Self-pay

## 2012-06-24 ENCOUNTER — Ambulatory Visit (HOSPITAL_COMMUNITY)
Admission: RE | Admit: 2012-06-24 | Discharge: 2012-06-24 | Disposition: A | Discharge status: Home / Self Care | Payer: BC Managed Care – PPO | Source: Ambulatory Visit | Attending: Cardiology | Admitting: Cardiology

## 2012-06-24 DIAGNOSIS — I4891 Unspecified atrial fibrillation: Secondary | ICD-10-CM

## 2012-06-24 HISTORY — PX: TEE WITHOUT CARDIOVERSION: SHX5443

## 2012-06-24 SURGERY — ECHOCARDIOGRAM, TRANSESOPHAGEAL
Anesthesia: Moderate Sedation

## 2012-06-24 MED ORDER — SODIUM CHLORIDE 0.9 % IV SOLN
INTRAVENOUS | Status: DC
  Administered 2012-06-24: 500 mL via INTRAVENOUS

## 2012-06-24 MED ORDER — FENTANYL CITRATE 0.05 MG/ML IJ SOLN
INTRAMUSCULAR | Status: AC
  Filled 2012-06-24: qty 2

## 2012-06-24 MED ORDER — MIDAZOLAM HCL 10 MG/2ML IJ SOLN
INTRAMUSCULAR | Status: DC | PRN
  Administered 2012-06-24: 2 mg via INTRAVENOUS
  Administered 2012-06-24: 1 mg via INTRAVENOUS

## 2012-06-24 MED ORDER — MIDAZOLAM HCL 5 MG/ML IJ SOLN
INTRAMUSCULAR | Status: AC
  Filled 2012-06-24: qty 2

## 2012-06-24 MED ORDER — BUTAMBEN-TETRACAINE-BENZOCAINE 2-2-14 % EX AERO
INHALATION_SPRAY | CUTANEOUS | Status: DC | PRN
  Administered 2012-06-24: 2 via TOPICAL

## 2012-06-24 MED ORDER — FENTANYL CITRATE 0.05 MG/ML IJ SOLN
INTRAMUSCULAR | Status: DC | PRN
  Administered 2012-06-24 (×2): 25 ug via INTRAVENOUS

## 2012-06-24 NOTE — CV Procedure (Signed)
Preliminary TEE report.  Versed 3 mg, Fentanyl 50 micrograms IV  Complications none  Findings: No LA/LAA thrombus.  Trivial MR. Normal LV function.  Full report to follow  Peter Swaziland MD, [facility]  06/24/2012 9:42 AM

## 2012-06-24 NOTE — Interval H&P Note (Signed)
History and Physical Interval Note:  06/24/2012 9:25 AM  Tyrone [name]  has presented today for surgery, with the diagnosis of a-fib  The various methods of treatment have been discussed with the patient and family. After consideration of risks, benefits and other options for treatment, the patient has consented to  Procedure(s) (LRB) with comments: TRANSESOPHAGEAL ECHOCARDIOGRAM (TEE) (N/A) as a surgical intervention .  The patient's history has been reviewed, patient examined, no change in status, stable for surgery.  I have reviewed the patient's chart and labs.  Questions were answered to the patient's satisfaction.     Theron Arista [facility] 06/24/2012 9:25 AM

## 2012-06-24 NOTE — H&P (Signed)
Physician History and Physical    Patient ID: Tyrone [name] MRN: [id number] DOB/AGE: 1966/07/11 46 y.o. Admit date: 06/24/2012  Primary Care Physician:  [name] MD Primary Cardiologist Hillis Range MD  HPI: 46 yo WM with recurrent Afib seen today for TEE in anticipation of Afib ablation in am. He has a history of tachycardia mediated cardiomyopathy and CHF. EF has normalized with control of his arrhythmia. He is on amiodarone. He is s/p CVA and is now on Xarelto. currrently without chest pain, SOB, or palpitations.  Review of systems complete and found to be negative unless listed above  Past Medical History  Diagnosis Date  . External thrombosed hemorrhoids   . Osteoarthrosis, unspecified whether generalized or localized, ankle and foot     of the knees,ankle and foot  . Guillain-Barre syndrome   . Cervicalgia   . Vitamin B 12 deficiency   . Urticaria, unspecified   . Sinoatrial node dysfunction   . Morbid obesity s/p gastric bypass   . Hernia of unspecified site of abdominal cavity without mention of obstruction or gangrene     of abdominal  . Unspecified intestinal malabsorption   . Pancytopenia   . Hypertension   . Sleep apnea     CPAP compliant  . GERD (gastroesophageal reflux disease)   . Persistent atrial fibrillation     a. Pradaxa started 01/2012;  b .s/p TEE/DCCV 02/16/2012; c. recurrent afib->Tikosyn Initiation 03/2012  . Nonischemic cardiomyopathy     a. 01/2012 Echo: EF 35-40%, Diff HK, mild MR;  b. 01/2012 R&L Heart Cath: mildly elevated R heart pressures (pcwp 17) , nl cors.  . Hematuria     a. 01/2012 - to f/u with urology as outpt.  . CVA (cerebral infarction) 05/01/12    on pradaxa at time of stroke, switched to xarelto    Family History  Problem Relation Age of Onset  . Hypertension Mother   . Hypertension Father   . Heart attack Father 55    Died  . Diabetes Father   . Kidney disease Father     with transplant    History   Social History  . Marital  Status: Married    Spouse Name: N/A    Number of Children: 1  . Years of Education: N/A   Occupational History  . Realtor    Social History Main Topics  . Smoking status: Never Smoker   . Smokeless tobacco: Former Neurosurgeon    Types: Chew    Quit date: 11/11/2002  . Alcohol Use: Yes  . Drug Use: Not on file  . Sexually Active: Yes   Other Topics Concern  . Not on file   Social History Narrative   Lives with wife.  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 Gastric bypass 2004  . Umbilical hernia repair     x 2  . Hernia repair   . Tee without cardioversion 02/16/2012    Procedure: TRANSESOPHAGEAL ECHOCARDIOGRAM (TEE);  Surgeon: [name], MD;  Location: [facility] ENDOSCOPY;  Service: Cardiovascular;  Laterality: N/A;  10a CV  . Cardioversion 02/16/2012    Procedure: CARDIOVERSION;  Surgeon: [name], MD;  Location: [facility] ENDOSCOPY;  Service: Cardiovascular;  Laterality: N/A;  . Cardioversion 04/22/2012    Procedure: CARDIOVERSION;  Surgeon: [name], MD;  Location: [facility] ENDOSCOPY;  Service: Cardiovascular;  Laterality: N/A;     Prescriptions prior to  admission  Medication Sig Dispense Refill  . amiodarone (PACERONE) 400 MG tablet Take 0.5 tablets (200 mg total) by mouth daily.  60 tablet  3  . calcium citrate (CALCITRATE - DOSED IN MG ELEMENTAL CALCIUM) 950 MG tablet Take 1 tablet by mouth daily.      . furosemide (LASIX) 40 MG tablet Take 1 tablet (40 mg total) by mouth 2 (two) times daily.  60 tablet  6  . losartan (COZAAR) 50 MG tablet Take 1 tablet (50 mg total) by mouth 2 (two) times daily.  60 tablet  6  . Multiple Vitamin (MULTIVITAMIN) tablet Take 3 tablets by mouth daily.       . Rivaroxaban (XARELTO) 20 MG TABS Take 1 tablet (20 mg total) by mouth daily.  30 tablet  3  . spironolactone (ALDACTONE) 50 MG tablet Take 0.5 tablets (25 mg total) by mouth daily.  30 tablet  6  . vitamin B-12  (CYANOCOBALAMIN) 1000 MCG tablet Take 1,000 mcg by mouth daily.      . vitamin E (VITAMIN E) 400 UNIT capsule Take 400 Units by mouth daily.        Physical Exam: Blood pressure 130/81, temperature 98.8 F (37.1 C), temperature source Oral, resp. rate 14, height 5\' 9"  (1.753 m), weight 103.874 kg (229 lb), SpO2 97.00%.  GEN- The patient is well appearing, alert and oriented x 3 today.  Head- normocephalic, atraumatic  Eyes- Sclera clear, conjunctiva pink  Ears- hearing intact  Oropharynx- clear  Neck- supple, no JVP  Lymph- no cervical lymphadenopathy  Lungs- Clear to ausculation bilaterally, normal work of breathing  Heart- Regular rate and rhythm, no murmurs, rubs or gallops, PMI not laterally displaced  GI- soft, NT, ND, + BS  Extremities- no clubbing, cyanosis, or edema  MS- no significant deformity or atrophy  Skin- no rash or lesion  Psych- euthymic mood, full affect  Neuro- strength and sensation are intac   Labs:   Lab Results  Component Value Date   WBC 4.0* 06/18/2012   HGB 10.6* 06/18/2012   HCT 32.6* 06/18/2012   MCV 96.0 06/18/2012   PLT 138.0* 06/18/2012    Lab 06/18/12 0911  NA 137  K 4.8  CL 105  CO2 27  BUN 12  CREATININE 0.9  CALCIUM 8.3*  PROT --  BILITOT --  ALKPHOS --  ALT --  AST --  GLUCOSE 106*   Lab Results  Component Value Date   CKTOTAL 57 02/12/2012   CKMB 1.2 02/12/2012   TROPONINI <0.30 02/12/2012    Lab Results  Component Value Date   CHOL 153 05/01/2012   CHOL 125 02/13/2012   Lab Results  Component Value Date   HDL 62 05/01/2012   HDL 62 10/30/8117   Lab Results  Component Value Date   LDLCALC 80 05/01/2012   LDLCALC 55 02/13/2012   Lab Results  Component Value Date   TRIG 56 05/01/2012   TRIG 39 02/13/2012   Lab Results  Component Value Date   CHOLHDL 2.5 05/01/2012   CHOLHDL 2.0 02/13/2012   No results found for this basename: LDLDIRECT      Telemetry: NSR  ASSESSMENT AND PLAN:  1. Recurrent atrial  fibrillation 2. CVA 3. Tachycardia mediated cardiomyopathy- now with normal EF.  Plan: TEE today. Possible afib ablation in am.  Signed: Judi Cong 06/24/2012, 9:19 AM

## 2012-06-24 NOTE — Progress Notes (Signed)
*  PRELIMINARY RESULTS* Echocardiogram Echocardiogram Transesophageal has been performed.  Tyrone [name] 06/24/2012, 10:05 AM

## 2012-06-25 ENCOUNTER — Encounter (HOSPITAL_COMMUNITY)
Admission: RE | Disposition: A | Discharge status: Home / Self Care | Payer: Self-pay | Source: Ambulatory Visit | Attending: Internal Medicine

## 2012-06-25 ENCOUNTER — Encounter (HOSPITAL_COMMUNITY): Payer: Self-pay | Admitting: Anesthesiology

## 2012-06-25 ENCOUNTER — Ambulatory Visit (HOSPITAL_COMMUNITY)
Admission: RE | Admit: 2012-06-25 | Discharge: 2012-06-26 | Disposition: A | Discharge status: Home / Self Care | Payer: BC Managed Care – PPO | Source: Ambulatory Visit | Attending: Internal Medicine | Admitting: Internal Medicine

## 2012-06-25 ENCOUNTER — Encounter (HOSPITAL_COMMUNITY): Payer: Self-pay | Admitting: General Practice

## 2012-06-25 ENCOUNTER — Ambulatory Visit (HOSPITAL_COMMUNITY): Payer: BC Managed Care – PPO | Admitting: Anesthesiology

## 2012-06-25 DIAGNOSIS — I4891 Unspecified atrial fibrillation: Secondary | ICD-10-CM

## 2012-06-25 DIAGNOSIS — I5042 Chronic combined systolic (congestive) and diastolic (congestive) heart failure: Secondary | ICD-10-CM

## 2012-06-25 DIAGNOSIS — I428 Other cardiomyopathies: Secondary | ICD-10-CM | POA: Insufficient documentation

## 2012-06-25 DIAGNOSIS — E669 Obesity, unspecified: Secondary | ICD-10-CM

## 2012-06-25 DIAGNOSIS — I10 Essential (primary) hypertension: Secondary | ICD-10-CM

## 2012-06-25 DIAGNOSIS — K2100 Gastro-esophageal reflux disease with esophagitis, without bleeding: Secondary | ICD-10-CM

## 2012-06-25 DIAGNOSIS — E876 Hypokalemia: Secondary | ICD-10-CM

## 2012-06-25 DIAGNOSIS — I495 Sick sinus syndrome: Secondary | ICD-10-CM | POA: Insufficient documentation

## 2012-06-25 DIAGNOSIS — I6340 Cerebral infarction due to embolism of unspecified cerebral artery: Secondary | ICD-10-CM

## 2012-06-25 HISTORY — PX: ATRIAL FIBRILLATION ABLATION: SHX5456

## 2012-06-25 LAB — POCT ACTIVATED CLOTTING TIME
Activated Clotting Time: 179 seconds
Activated Clotting Time: 194 seconds

## 2012-06-25 SURGERY — ATRIAL FIBRILLATION ABLATION
Anesthesia: General

## 2012-06-25 MED ORDER — ISOPROTERENOL HCL 0.2 MG/ML IJ SOLN
1000.0000 ug | INTRAVENOUS | Status: DC | PRN
  Administered 2012-06-25: 20 ug/min via INTRAVENOUS

## 2012-06-25 MED ORDER — SODIUM CHLORIDE 0.9 % IJ SOLN
3.0000 mL | Freq: Two times a day (BID) | INTRAMUSCULAR | Status: DC
  Administered 2012-06-25 (×2): 3 mL via INTRAVENOUS

## 2012-06-25 MED ORDER — HEPARIN SODIUM (PORCINE) 1000 UNIT/ML IJ SOLN
INTRAMUSCULAR | Status: DC | PRN
  Administered 2012-06-25: 10000 [IU] via INTRAVENOUS
  Administered 2012-06-25: 5000 [IU] via INTRAVENOUS
  Administered 2012-06-25: 7000 [IU] via INTRAVENOUS

## 2012-06-25 MED ORDER — BUPIVACAINE HCL (PF) 0.25 % IJ SOLN
INTRAMUSCULAR | Status: AC
  Filled 2012-06-25: qty 30

## 2012-06-25 MED ORDER — ONDANSETRON HCL 4 MG/2ML IJ SOLN: 4.0000 mg | Freq: Four times a day (QID) | INTRAMUSCULAR | Status: DC | PRN

## 2012-06-25 MED ORDER — ZOLPIDEM TARTRATE 5 MG PO TABS
5.0000 mg | ORAL_TABLET | Freq: Every evening | ORAL | Status: DC | PRN
  Administered 2012-06-25: 22:00:00 5 mg via ORAL
  Filled 2012-06-25: qty 1

## 2012-06-25 MED ORDER — VITAMIN E 180 MG (400 UNIT) PO CAPS
400.0000 [IU] | ORAL_CAPSULE | Freq: Every day | ORAL | Status: DC
  Administered 2012-06-26: 400 [IU] via ORAL
  Filled 2012-06-25: qty 1

## 2012-06-25 MED ORDER — PROPOFOL 10 MG/ML IV BOLUS
INTRAVENOUS | Status: DC | PRN
  Administered 2012-06-25: 200 mg via INTRAVENOUS
  Administered 2012-06-25 (×2): 100 mg via INTRAVENOUS

## 2012-06-25 MED ORDER — MIDAZOLAM HCL 5 MG/5ML IJ SOLN
INTRAMUSCULAR | Status: DC | PRN
  Administered 2012-06-25: 2 mg via INTRAVENOUS

## 2012-06-25 MED ORDER — FENTANYL CITRATE 0.05 MG/ML IJ SOLN
INTRAMUSCULAR | Status: DC | PRN
  Administered 2012-06-25 (×2): 100 ug via INTRAVENOUS

## 2012-06-25 MED ORDER — SPIRONOLACTONE 25 MG PO TABS
25.0000 mg | ORAL_TABLET | Freq: Every day | ORAL | Status: DC
  Administered 2012-06-26: 25 mg via ORAL
  Filled 2012-06-25: qty 1

## 2012-06-25 MED ORDER — VITAMIN B-12 1000 MCG PO TABS
1000.0000 ug | ORAL_TABLET | Freq: Every day | ORAL | Status: DC
  Filled 2012-06-25: qty 1

## 2012-06-25 MED ORDER — ADULT MULTIVITAMIN W/MINERALS CH
1.0000 | ORAL_TABLET | Freq: Every day | ORAL | Status: DC
  Administered 2012-06-26: 10:00:00 1 via ORAL
  Filled 2012-06-25: qty 1

## 2012-06-25 MED ORDER — ACETAMINOPHEN 325 MG PO TABS: 650.0000 mg | ORAL_TABLET | ORAL | Status: DC | PRN

## 2012-06-25 MED ORDER — AMIODARONE HCL 200 MG PO TABS
200.0000 mg | ORAL_TABLET | Freq: Every day | ORAL | Status: DC
  Administered 2012-06-26: 200 mg via ORAL
  Filled 2012-06-25: qty 1

## 2012-06-25 MED ORDER — PROTAMINE SULFATE 10 MG/ML IV SOLN
INTRAVENOUS | Status: DC | PRN
  Administered 2012-06-25: 20 mg via INTRAVENOUS
  Administered 2012-06-25: 10 mg via INTRAVENOUS

## 2012-06-25 MED ORDER — EPHEDRINE SULFATE 50 MG/ML IJ SOLN
INTRAMUSCULAR | Status: DC | PRN
  Administered 2012-06-25: 5 mg via INTRAVENOUS
  Administered 2012-06-25 (×2): 10 mg via INTRAVENOUS

## 2012-06-25 MED ORDER — RIVAROXABAN 20 MG PO TABS
20.0000 mg | ORAL_TABLET | Freq: Every day | ORAL | Status: DC
  Administered 2012-06-25: 20 mg via ORAL
  Filled 2012-06-25 (×2): qty 1

## 2012-06-25 MED ORDER — HYDROCODONE-ACETAMINOPHEN 5-325 MG PO TABS: 1.0000 | ORAL_TABLET | ORAL | Status: DC | PRN

## 2012-06-25 MED ORDER — SODIUM CHLORIDE 0.9 % IV SOLN
INTRAVENOUS | Status: DC | PRN
  Administered 2012-06-25: 07:00:00 via INTRAVENOUS

## 2012-06-25 MED ORDER — CALCIUM CITRATE 950 (200 CA) MG PO TABS
1.0000 | ORAL_TABLET | Freq: Every day | ORAL | Status: DC
  Administered 2012-06-26: 200 mg via ORAL
  Filled 2012-06-25: qty 1

## 2012-06-25 MED ORDER — ONE-DAILY MULTI VITAMINS PO TABS: 3.0000 | ORAL_TABLET | Freq: Every day | ORAL | Status: DC

## 2012-06-25 MED ORDER — SODIUM CHLORIDE 0.9 % IV SOLN: 250.0000 mL | INTRAVENOUS | Status: DC | PRN

## 2012-06-25 MED ORDER — LIDOCAINE HCL (CARDIAC) 20 MG/ML IV SOLN
INTRAVENOUS | Status: DC | PRN
  Administered 2012-06-25: 60 mg via INTRAVENOUS

## 2012-06-25 MED ORDER — ONDANSETRON HCL 4 MG/2ML IJ SOLN: 4.0000 mg | Freq: Once | INTRAMUSCULAR | Status: DC | PRN

## 2012-06-25 MED ORDER — SODIUM CHLORIDE 0.9 % IJ SOLN: 3.0000 mL | INTRAMUSCULAR | Status: DC | PRN

## 2012-06-25 MED ORDER — ONDANSETRON HCL 4 MG/2ML IJ SOLN
INTRAMUSCULAR | Status: DC | PRN
  Administered 2012-06-25: 4 mg via INTRAVENOUS

## 2012-06-25 MED ORDER — LOSARTAN POTASSIUM 50 MG PO TABS
50.0000 mg | ORAL_TABLET | Freq: Two times a day (BID) | ORAL | Status: DC
  Administered 2012-06-25 – 2012-06-26 (×2): 50 mg via ORAL
  Filled 2012-06-25 (×3): qty 1

## 2012-06-25 MED ORDER — HYDROMORPHONE HCL PF 1 MG/ML IJ SOLN: 0.2500 mg | INTRAMUSCULAR | Status: DC | PRN

## 2012-06-25 NOTE — Anesthesia Procedure Notes (Signed)
Procedure Name: LMA Insertion Date/Time: 06/25/2012 7:49 AM Performed by: Tyrone Nine Pre-anesthesia Checklist: Patient identified, Patient being monitored, Emergency Drugs available, Timeout performed and Suction available Patient Re-evaluated:Patient Re-evaluated prior to inductionOxygen Delivery Method: Circle system utilized Preoxygenation: Pre-oxygenation with 100% oxygen Intubation Type: IV induction LMA: LMA with gastric port inserted LMA Size: 5.0 Number of attempts: 1 Placement Confirmation: positive ETCO2 Tube secured with: Tape Dental Injury: Teeth and Oropharynx as per pre-operative assessment

## 2012-06-25 NOTE — Preoperative (Signed)
Beta Blockers   Reason not to administer Beta Blockers:Not Applicable 

## 2012-06-25 NOTE — Anesthesia Preprocedure Evaluation (Addendum)
Anesthesia Evaluation  Patient identified by MRN, date of birth, ID band Patient awake    Reviewed: Allergy & Precautions, H&P , NPO status , Patient's Chart, lab work & pertinent test results  Airway Mallampati: II TM Distance: >3 FB Neck ROM: full    Dental  (+) Teeth Intact and Dental Advisory Given   Pulmonary sleep apnea ,  C-Pap setting 4 cm H2O @ HS   Pulmonary exam normal       Cardiovascular hypertension, + dysrhythmias Atrial Fibrillation Rhythm:Regular Rate:Bradycardia  ------------------------------------------------------------ Study Conclusions  - Left ventricle: The cavity size was normal. Wall thickness   was normal. Systolic function was normal. The estimated   ejection fraction was in the range of 60% to 65%. - Left atrium: The atrium was mildly dilated. - Pulmonary arteries: PA peak pressure: 33mm Hg (S). Transthoracic echocardiography.  M-mode, complete 2D, spectral Doppler, and color Doppler.  Weight:  Weight: 106.4kg. Weight: 234lb.  Blood pressure:     123/71. Patient status:  Observation.  Location:  Echo laboratory.  05-01-12   Neuro/Psych  Neuromuscular disease CVA, No Residual Symptoms    GI/Hepatic GERD-  Medicated and Controlled,S/P Gastric Bypass w/ loss of 200 #'s   Endo/Other  Morbid obesity  Renal/GU      Musculoskeletal  (+) Arthritis -, Osteoarthritis,    Abdominal   Peds  Hematology negative hematology ROS (+)   Anesthesia Other Findings   Reproductive/Obstetrics                      Anesthesia Physical Anesthesia Plan  ASA: III  Anesthesia Plan: General   Post-op Pain Management:    Induction: Intravenous  Airway Management Planned: LMA  Additional Equipment:   Intra-op Plan:   Post-operative Plan: Extubation in OR  Informed Consent: I have reviewed the patients History and Physical, chart, labs and discussed the procedure including the  risks, benefits and alternatives for the proposed anesthesia with the patient or authorized representative who has indicated his/her understanding and acceptance.     Plan Discussed with: CRNA, Anesthesiologist and Surgeon  Anesthesia Plan Comments:         Anesthesia Quick Evaluation

## 2012-06-25 NOTE — Anesthesia Postprocedure Evaluation (Signed)
  Anesthesia Post-op Note  Patient: Tyrone [name]  Procedure(s) Performed: Procedure(s) (LRB) with comments: ATRIAL FIBRILLATION ABLATION (N/A)  Patient Location: Cath Lab  Anesthesia Type:General  Level of Consciousness: awake, alert , oriented and patient cooperative  Airway and Oxygen Therapy: Patient Spontanous Breathing  Post-op Pain: none  Post-op Assessment: Post-op Vital signs reviewed, Patient's Cardiovascular Status Stable, Respiratory Function Stable, Patent Airway, No signs of Nausea or vomiting and Pain level controlled  Post-op Vital Signs: stable  Complications: No apparent anesthesia complications

## 2012-06-25 NOTE — H&P (Signed)
Tyrone [name] is a 46 y.o. male with a h/o obesity, OSA (on CPAP), persistent atrial fibrillation, and tachycardia mediated cardiomyopathy (now resolved) who presents for consideration of catheter ablation for afib. He was initially diagnosed with atrial fibrillation earlier this year after presenting with CHF. He was found to have a depressed EF. He was cardioverted 7/13. He did well initially but developed recurrent atrial fibrillation. He was hospitalized for initiation of tikosyn but did not tolerate this medicine due to QT prolongation. He was placed on amiodarone and cardioverted 04/22/12. He was anticoagulated with pradaxa at the time. Unfortunately, he returned 05/01/12 with acute CVA. His PTT at that time was 34. He was switched from pradaxa to xarelto. His EF was found to have normalized.  The patient has maintained sinus rhythm with amiodarone. He is very concerned about side effects with this medicine.     Past Medical History   Diagnosis  Date   .  External thrombosed hemorrhoids    .  Osteoarthrosis, unspecified whether generalized or localized, ankle and foot      of the knees,ankle and foot   .  Guillain-Barre syndrome    .  Cervicalgia    .  Vitamin B 12 deficiency    .  Urticaria, unspecified    .  Sinoatrial node dysfunction    .  Morbid obesity s/p gastric bypass    .  Hernia of unspecified site of abdominal cavity without mention of obstruction or gangrene      of abdominal   .  Unspecified intestinal malabsorption    .  Pancytopenia    .  Hypertension    .  Sleep apnea      CPAP compliant   .  GERD (gastroesophageal reflux disease)    .  Persistent atrial fibrillation      a. Pradaxa started 01/2012; b .s/p TEE/DCCV 02/16/2012; c. recurrent afib->Tikosyn Initiation 03/2012   .  Nonischemic cardiomyopathy      a. 01/2012 Echo: EF 35-40%, Diff HK, mild MR; b. 01/2012 R&L Heart Cath: mildly elevated R heart pressures (pcwp 17) , nl cors.   .  Hematuria      a. 01/2012 - to f/u  with urology as outpt.   .  CVA (cerebral infarction)  05/01/12     on pradaxa at time of stroke, switched to xarelto    Past Surgical History   Procedure  Date   .  Gastric bypass  2004   .  Umbilical hernia repair      x 2   .  Hernia repair    .  Tee without cardioversion  02/16/2012     Procedure: TRANSESOPHAGEAL ECHOCARDIOGRAM (TEE); Surgeon: [name], MD; Location: [facility] ENDOSCOPY; Service: Cardiovascular; Laterality: N/A; 10a CV   .  Cardioversion  02/16/2012     Procedure: CARDIOVERSION; Surgeon: [name], MD; Location: [facility] ENDOSCOPY; Service: Cardiovascular; Laterality: N/A;   .  Cardioversion  04/22/2012     Procedure: CARDIOVERSION; Surgeon: [name], MD; Location: [facility] ENDOSCOPY; Service: Cardiovascular; Laterality: N/A;    Current Outpatient Prescriptions   Medication  Sig  Dispense  Refill   .  amiodarone (PACERONE) 400 MG tablet  Take 0.5 tablets (200 mg total) by mouth daily.  60 tablet  3   .  calcium citrate (CALCITRATE - DOSED IN MG ELEMENTAL CALCIUM) 950 MG tablet  Take 1 tablet by mouth daily.     .  Cyanocobalamin (VITAMIN B12 PO)  Take 1 tablet by mouth daily.     .  furosemide (LASIX) 40 MG tablet  Take 1 tablet (40 mg total) by mouth 2 (two) times daily.  60 tablet  6   .  losartan (COZAAR) 50 MG tablet  Take 1 tablet (50 mg total) by mouth 2 (two) times daily.  60 tablet  6   .  Multiple Vitamin (MULTIVITAMIN) tablet  Take 3 tablets by mouth daily.     .  Rivaroxaban (XARELTO) 20 MG TABS  Take 1 tablet (20 mg total) by mouth daily.  30 tablet  3   .  spironolactone (ALDACTONE) 50 MG tablet  Take 0.5 tablets (25 mg total) by mouth daily.  30 tablet  6   .  vitamin E (VITAMIN E) 400 UNIT capsule  Take 400 Units by mouth daily.      No Known Allergies  History    Social History   .  Marital Status:  Married     Spouse Name:  N/A     Number of Children:  1   .  Years of Education:  N/A    Occupational History   .  Realtor     Social History  Main Topics   .  Smoking status:  Never Smoker   .  Smokeless tobacco:  Former Neurosurgeon     Types:  Chew     Quit date:  11/11/2002   .  Alcohol Use:  Yes   .  Drug Use:  Not on file   .  Sexually Active:  Yes    Other Topics  Concern   .  Not on file    Social History Narrative    Lives with wife. 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  Hypertension  Mother    .  Hypertension  Father    .  Heart attack  Father  55      Died    .  Diabetes  Father    .  Kidney disease  Father       with transplant    ROS- All systems are reviewed and negative except as per the HPI above   Physical Exam:  Filed Vitals:    05/10/12 1208   BP:  122/86   Pulse:  55   Height:  5\' 9"  (1.753 m)   Weight:  236 lb 12.8 oz (107.412 kg)    GEN- The patient is well appearing, alert and oriented x 3 today.  Head- normocephalic, atraumatic  Eyes- Sclera clear, conjunctiva pink  Ears- hearing intact  Oropharynx- clear  Neck- supple, no JVP  Lymph- no cervical lymphadenopathy  Lungs- Clear to ausculation bilaterally, normal work of breathing  Heart- Regular rate and rhythm, no murmurs, rubs or gallops, PMI not laterally displaced  GI- soft, NT, ND, + BS  Extremities- no clubbing, cyanosis, or edema  MS- no significant deformity or atrophy  Skin- no rash or lesion  Psych- euthymic mood, full affect  Neuro- strength and sensation are intact  Echo- reviewed in epic , TEE from yesterday also reviewed- no LAA thrombus  Assessment and Plan:   Atrial fibrillation - Hillis Range, MD   The patient has symptomatic persistent atrial fibrillation. He has failed medical therapy with tikosyn. Given his prior cardiomyopathy, his only other antiarrhythmic option is felt to be amiodarone. He  is very concerned about risks of this medicine long term.  Presently, he is anticoagulated with xarelto.  Therapeutic strategies for afib  including medicine and ablation were discussed in detail with the patient today. Risk, benefits, and alternatives to EP study and radiofrequency ablation for afib were also discussed in detail today. These risks include but are not limited to stroke, bleeding, vascular damage, tamponade, perforation, damage to the esophagus, lungs, and other structures, pulmonary vein stenosis, worsening renal function, and death. The patient understands these risk and wishes to proceed at this time.    Fayrene Fearing Dnasia Gauna,MD

## 2012-06-25 NOTE — Op Note (Addendum)
SURGEON:  Hillis Range, MD  PREPROCEDURE DIAGNOSES: 1. Persistent atrial fibrillation.  POSTPROCEDURE DIAGNOSES: 1. Persistent atrial fibrillation.  PROCEDURES: 1. Comprehensive electrophysiologic study. 2. Coronary sinus pacing and recording. 3. Three-dimensional mapping of atrial fibrillation (with additional mapping and ablation of a second discrete right atrial focus) 4. Ablation of atrial fibrillation (with additional mapping and ablation of a second discrete right atrial focus) 5. Intracardiac echocardiography. 6. Transseptal puncture of an intact septum. 7. Rotational Angiography with processing at an independent workstation 8. Arrhythmia induction with pacing with isuprel infusion  INTRODUCTION:  Tyrone [name] is a 46 y.o. male with a history of atrial fibrillation who now presents for EP study and radiofrequency ablation.  The patient reports initially being diagnosed with atrial fibrillation after presenting with symptomatic palpitations and fatgiue. The patient reports increasing frequency and duration of atrial fibrillation since that time.  The patient has failed medical therapy with Joice Lofts.  He has no other antiarrhythmic options other than amiodarone.  Given his young age, he is felt to not be a candidate for amiodarone long term.  The patient therefore presents today for catheter ablation of atrial fibrillation.  DESCRIPTION OF PROCEDURE:  Informed written consent was obtained, and the patient was brought to the electrophysiology lab in a fasting state.  The patient was adequately sedated with intravenous medications as outlined in the anesthesia report.  The patient's left and right groins were prepped and draped in the usual sterile fashion by the EP lab staff.  Using a percutaneous Seldinger technique, two 7-French and one 11-French hemostasis sheaths were placed into the right common femoral vein.   3 Dimensional Rotational Angiography: A 5 french pigtail catheter was  introduced through the right common femoral vein and advanced into the inferior venocava.  3 demential rotational angiography was then performed by power injection of 100cc of nonionic contrast.  Reprocessing at an independent work station was then performed.   This demonstrated a moderate sized left atrium with 4 separate pulmonary veins.  The superior veins were large (22-26mm).  The inferior veins were moderate in size (17-14mm).  There were no anomalous veins or significant abnormalities.  A 3 dimensional rendering of the left atrium was then merged using [employer] onto the WellPoint system and registered with intracardiac echo (see below).  The pigtail catheter was then removed.  Catheter Placement:  A 7-French Biosense Webster Decapolar coronary sinus catheter was introduced through the right common femoral vein and advanced into the coronary sinus for recording and pacing from this location.  A 6-French quadripolar Josephson catheter was introduced through the right common femoral vein and advanced into the right ventricle for recording and pacing.  This catheter was then pulled back to the His bundle location.    Initial Measurements: The patient presented to the electrophysiology lab in sinus rhythm.  His PR interval measured 141 msec with a QRS duringation of 112 msec and a QT interval of 548 msec.  The AH interval measured and the HV interval measured 41 msec.     Intracardiac Echocardiography: A 10-French Biosense Webster AcuNav intracardiac echocardiography catheter was introduced through the left common femoral vein and advanced into the right atrium. Intracardiac echocardiography was performed of the left atrium, and a three-dimensional anatomical rendering of the left atrium was performed using CARTO sound technology.  The patient was noted to have a moderate sized left atrium.  The interatrial septum was prominent but not aneurysmal. All 4 pulmonary veins were  visualized  and noted to have separate ostia.  The left atrial appendage was visualized and did not reveal thrombus.   There was no evidence of pulmonary vein stenosis.   Transseptal Puncture: The middle right common femoral vein sheath was exchanged for an 8.5 [place] SL2 transseptal sheath and transseptal access was achieved in a standard fashion using a Brockenbrough needle under biplane fluoroscopy with intracardiac echocardiography confirmation of the transseptal puncture.  Once transseptal access had been achieved, heparin was administered intravenously and intra- arterially in order to maintain an ACT of greater than 320 seconds throughout the procedure.   3D Mapping and Ablation: The His bundle catheter was removed and in its place a 3.5 mm Biosense Webster EZ [employer] ablation catheter was advanced into the right atrium.  The transseptal sheath was pulled back into the IVC over a guidewire.  The ablation catheter was advanced across the transseptal hole using the wire as a guide.  The transseptal sheath was then re-advanced over the guidewire into the left atrium.  A duodecapolar Biosense Webster circular mapping catheter was introduced through the transseptal sheath and positioned over the mouth of all 4 pulmonary veins.  Three-dimensional electroanatomical mapping was performed using CARTO technology.  This demonstrated electrical activity within all four pulmonary veins at baseline. The patient underwent successful sequential electrical isolation and anatomical encircling of all four pulmonary veins using radiofrequency current with a circular mapping catheter as a guide.   Additional Mapping and ablation of a second discrete focus: The circular mapping catheter was pulled back into the right atrium and 3D mapping was performed at the junction of the superior vena cava and right atrium.  Electrical activity was observed within the SVC.  I therefore elected to perform right atrial ablation  in this area.  A series of radiofrequency applications were delivered in a circular fashion around the ostium of the SVC.  Prior to each ablation lesion, pacing was performed from the distal ablation electrode to insure that diaphragmatic stimulation was not observed to avoid phrenic nerve injury.  Diaphragmatic excursion was also observed during ablation.         Measurements Following Ablation: Following ablation, Isuprel was infused up to 20 mcg/min with no inducible atrial fibrillation, atrial tachycardia, atrial flutter, or sustained PACs. In sinus rhythm with RR interval was 1027, with PR , QRS 113 msec, and Qtc 443 msec.  Following ablation the AH interval measured 74 msec with an HV interval of 41 msec. Ventricular pacing was performed, which revealed midline decremental VA conduction with a VA Wenckebach cycle length of 430 msec.  Rapid atrial pacing was performed, which revealed an AV Wenckebach cycle length of 360 msec.  Electroisolation was then again confirmed in all four pulmonary veins.  Rapid atrial pacing was performed down to a cycle length of with no arrhythmias observed.  The procedure was therefore considered completed.  All catheters were removed, and the sheaths were aspirated and flushed.  The patient was transferred to the recovery area for sheath removal per protocol.  A limited bedside transthoracic echocardiogram revealed no pericardial effusion.  There were no early apparent complications.  CONCLUSIONS: 1. Sinus rhythm upon presentation.   2. Rotational Angiography reveals a moderate sized left atrium with four separate pulmonary veins without evidence of pulmonary vein stenosis. 3. Successful electrical isolation and anatomical encircling of all four pulmonary veins with radiofrequency current.  SVC isolation was also performed. 4. No inducible arrhythmias following ablation both on and off of Isuprel 5.  No early apparent complications.   Sunnie Odden,MD 11:06 AM 06/25/2012

## 2012-06-25 NOTE — Transfer of Care (Signed)
Immediate Anesthesia Transfer of Care Note  Patient: Tyrone [name]  Procedure(s) Performed: Procedure(s) (LRB) with comments: ATRIAL FIBRILLATION ABLATION (N/A)  Patient Location: PACU  Anesthesia Type:General  Level of Consciousness: awake, alert , oriented and patient cooperative  Airway & Oxygen Therapy: Patient Spontanous Breathing and Patient connected to nasal cannula oxygen  Post-op Assessment: Report given to PACU RN and Post -op Vital signs reviewed and stable  Post vital signs: Reviewed and stable  Complications: No apparent anesthesia complications

## 2012-06-26 DIAGNOSIS — I4891 Unspecified atrial fibrillation: Secondary | ICD-10-CM

## 2012-06-26 DIAGNOSIS — I10 Essential (primary) hypertension: Secondary | ICD-10-CM

## 2012-06-26 LAB — BASIC METABOLIC PANEL
BUN: 10 mg/dL (ref 6–23)
CO2: 27 mEq/L (ref 19–32)
Calcium: 7.9 mg/dL — ABNORMAL LOW (ref 8.4–10.5)
Chloride: 103 mEq/L (ref 96–112)
Creatinine, Ser: 0.92 mg/dL (ref 0.50–1.35)
GFR calc Af Amer: >90 mL/min (ref >90)
GFR calc non Af Amer: >90 mL/min (ref >90)
Glucose, Bld: 102 mg/dL — ABNORMAL HIGH (ref 70–99)
Potassium: 4 mEq/L (ref 3.5–5.1)
Sodium: 137 mEq/L (ref 135–145)

## 2012-06-26 LAB — POCT ACTIVATED CLOTTING TIME
Activated Clotting Time: 239 seconds
Activated Clotting Time: 289 seconds
Activated Clotting Time: 324 seconds
Activated Clotting Time: 279 seconds

## 2012-06-26 MED ORDER — OFF THE BEAT BOOK
Freq: Once | Status: DC
  Filled 2012-06-26: qty 1

## 2012-06-26 MED ORDER — PANTOPRAZOLE SODIUM 40 MG PO TBEC: 40.0000 mg | DELAYED_RELEASE_TABLET | Freq: Every day | ORAL | Status: DC

## 2012-06-26 NOTE — Discharge Summary (Signed)
ELECTROPHYSIOLOGY DISCHARGE SUMMARY    Patient ID: Tyrone [name],  MRN: [id number], DOB/AGE: February 02, 1966 46 y.o.  Admit date: 06/25/2012 Discharge date: 06/26/2012  Primary Care Physician: [name], MD Primary Cardiologist:   Primary Discharge Diagnosis:  1. Persistent atrial fibrillation s/p AFib ablation 2. Sinus node dysfunction  Secondary Discharge Diagnoses:  1. Tachycardia-mediated cardiomyopathy, EF 35-40% 2. Prior CVA 3. OSA 4. Obesity s/p gastric bypass surgery 5. HTN 6. GERD 7. History of Guillian-Barre syndrome  Procedures This Admission:  1. Comprehensive electrophysiologic study.  2. Coronary sinus pacing and recording.  3. Three-dimensional mapping of atrial fibrillation (with additional mapping and ablation of a second discrete right atrial focus)  4. Ablation of atrial fibrillation (with additional mapping and ablation of a second discrete right atrial focus)  5. Intracardiac echocardiography.  6. Transseptal puncture of an intact septum.  7. Rotational Angiography with processing at an independent workstation  8. Arrhythmia induction with pacing with isuprel infusion CONCLUSIONS:  1. Sinus rhythm upon presentation.  2. Rotational Angiography reveals a moderate sized left atrium with four separate pulmonary veins without evidence of pulmonary vein stenosis.  3. Successful electrical isolation and anatomical encircling of all four pulmonary veins with radiofrequency current. SVC isolation was also performed.  4. No inducible arrhythmias following ablation both on and off of Isuprel  5. No early apparent complications.  History and Hospital Course:  Tyrone [name] is a 46 year old man with persistent atrial fibrillation, initially diagnosed after presenting acute CHF, palpitations and fatgiue. He reports increasing frequency and duration of atrial fibrillation since that time and has failed medical therapy with Joice Lofts. He has no other antiarrhythmic options except  amiodarone and, given his young age, he is felt to not be a candidate for amiodarone long term. Tyrone [name] therefore presented yesterday for catheter ablation of atrial fibrillation. He tolerated this procedure well without any immediate complication. He remains hemodynamically stable and afebrile. His groin site is intact without significant bleeding or hematoma. He has been given discharge instructions including wound care and activity restrictions. He will follow-up in clinic in 12 weeks. He has been seen, examined and deemed stable for discharge today by Dr. Hillis Range.  Discharge Vitals: Blood pressure 113/59, pulse 46, temperature 98.1 F (36.7 C), temperature source Oral, resp. rate 18, height 5\' 6"  (1.676 m), weight 236 lb 15.9 oz (107.5 kg), SpO2 98.00%.   Labs: Lab Results  Component Value Date   WBC 4.0* 06/18/2012   HGB 10.6* 06/18/2012   HCT 32.6* 06/18/2012   MCV 96.0 06/18/2012   PLT 138.0* 06/18/2012    Lab 06/26/12 0505  NA 137  K 4.0  CL 103  CO2 27  BUN 10  CREATININE 0.92  CALCIUM 7.9*  PROT --  BILITOT --  ALKPHOS --  ALT --  AST --  GLUCOSE 102*    Disposition:  The patient is being discharged in stable condition.  Follow-up: Follow-up Information    Follow up with Hillis Range, MD. On 09/25/2012. (At 9:30 AM)    Contact information:   [address] [place] [address] [phone]       Discharge Medications:    Medication List     As of 06/26/2012  9:53 AM    TAKE these medications         amiodarone 400 MG tablet   Commonly known as: PACERONE   Take 0.5 tablets (200 mg total) by mouth daily.  calcium citrate 950 MG tablet   Commonly known as: CALCITRATE - dosed in mg elemental calcium   Take 1 tablet by mouth daily.      furosemide 40 MG tablet   Commonly known as: LASIX   Take 1 tablet (40 mg total) by mouth 2 (two) times daily.      losartan 50 MG tablet   Commonly known as: COZAAR   Take 1 tablet (50 mg  total) by mouth 2 (two) times daily.      multivitamin tablet   Take 3 tablets by mouth daily.      pantoprazole 40 MG tablet   Commonly known as: PROTONIX   Take 1 tablet (40 mg total) by mouth daily.      Rivaroxaban 20 MG Tabs   Commonly known as: XARELTO   Take 1 tablet (20 mg total) by mouth daily.      spironolactone 50 MG tablet   Commonly known as: ALDACTONE   Take 0.5 tablets (25 mg total) by mouth daily.      vitamin B-12 1000 MCG tablet   Commonly known as: CYANOCOBALAMIN   Take 1,000 mcg by mouth daily.      vitamin E 400 UNIT capsule   Generic drug: vitamin E   Take 400 Units by mouth daily.      Duration of Discharge Encounter: Greater than 30 minutes including physician time.  Limmie Patricia, PA-C 06/26/2012, 9:53 AM    Hillis Range, MD

## 2012-06-26 NOTE — Progress Notes (Signed)
   SUBJECTIVE: The patient is doing well today.  At this time, he denies chest pain, shortness of breath, odynophagia, neuro symptoms, or any new concerns.     Marland Kitchen amiodarone  200 mg Oral Daily  . calcium citrate  1 tablet Oral Daily  . losartan  50 mg Oral BID  . multivitamin with minerals  1 tablet Oral Daily  . Rivaroxaban  20 mg Oral Q supper  . sodium chloride  3 mL Intravenous Q12H  . spironolactone  25 mg Oral Daily  . vitamin B-12  1,000 mcg Oral Daily  . vitamin E  400 Units Oral Daily  . [DISCONTINUED] multivitamin  3 tablet Oral Daily      OBJECTIVE: Physical Exam: Filed Vitals:   06/25/12 1800 06/25/12 2015 06/26/12 0050 06/26/12 0420  BP: 99/56 107/56 93/50 116/67  Pulse:  54 54 54  Temp:  97.7 F (36.5 C) 98.4 F (36.9 C) 98.5 F (36.9 C)  TempSrc:  Oral Oral Oral  Resp: 29 25 26 17   Height:      Weight:    236 lb 15.9 oz (107.5 kg)  SpO2:   100% 100%    Intake/Output Summary (Last 24 hours) at 06/26/12 0831 Last data filed at 06/25/12 2000  Gross per 24 hour  Intake   1740 ml  Output    270 ml  Net   1470 ml    Telemetry reveals sinus rhythm, nonsustained afib x 1 GEN- The patient is well appearing, alert and oriented x 3 today.   Head- normocephalic, atraumatic Eyes-  Sclera clear, conjunctiva pink Ears- hearing intact Oropharynx- clear Neck- supple, no JVP Lymph- no cervical lymphadenopathy Lungs- Clear to ausculation bilaterally, normal work of breathing Heart- Regular rate and rhythm, no murmurs, rubs or gallops, PMI not laterally displaced GI- soft, NT, ND, + BS Extremities- no clubbing, cyanosis, or edema, minor ecchymosis of R groin especially over scrotum, no bruit Skin- no rash or lesion Psych- euthymic mood, full affect Neuro- strength and sensation are intact  LABS: Basic Metabolic Panel:  Basename 06/26/12 0505  NA 137  K 4.0  CL 103  CO2 27  GLUCOSE 102*  BUN 10  CREATININE 0.92  CALCIUM 7.9*  MG --  PHOS --    ASSESSMENT AND PLAN:  Active Problems:  Atrial fibrillation  Sinoatrial node dysfunction/bradycardia  1. afib Doing well s/p ablation Resume home medicine Continue amiodarone and xarelto Add protonix 40mg  daily x 6 weeks  2. HTN Stable Resume home medicine   DC to home today  Follow up with me in 12 weeks    Hillis Range, MD 06/26/2012 8:31 AM

## 2012-06-26 NOTE — Progress Notes (Signed)
After I received report on patient I was notified that patient had previous burst Afib followed by 1.39 pause approximately 1148 pm. Patient then remained Sinus brady the rest of the night.

## 2012-06-26 NOTE — Progress Notes (Signed)
Utilization Review Completed.   Gailya Tauer, RN, BSN Nurse Case Manager  [phone]  

## 2012-07-01 ENCOUNTER — Telehealth: Payer: Self-pay | Admitting: Internal Medicine

## 2012-07-01 NOTE — Telephone Encounter (Signed)
plz return call to pt on cell [phone] regarding groin pain since last Friday 06/28/12.  Pt has ablation w/[facility] on 06/25/12

## 2012-07-01 NOTE — Telephone Encounter (Signed)
Spoke with patient and let him know that the knot in his groin is normal.  It is the size of a small grape.  As long as it does not get any larger we will continue to monitor.  He will call me if it gets worse.

## 2012-08-07 ENCOUNTER — Other Ambulatory Visit: Payer: Self-pay | Admitting: Cardiology

## 2012-08-30 ENCOUNTER — Other Ambulatory Visit: Payer: Self-pay | Admitting: *Deleted

## 2012-08-30 MED ORDER — RIVAROXABAN 20 MG PO TABS: 20.0000 mg | ORAL_TABLET | Freq: Every day | ORAL | Status: DC

## 2012-08-30 NOTE — Telephone Encounter (Signed)
Fax Received. Refill Completed. Margarita Bobrowski Chowoe (R.M.A)  Patient wife called wanting refills for pt.

## 2012-09-07 ENCOUNTER — Other Ambulatory Visit: Payer: Self-pay

## 2012-09-25 ENCOUNTER — Ambulatory Visit (INDEPENDENT_AMBULATORY_CARE_PROVIDER_SITE_OTHER): Payer: BC Managed Care – PPO | Admitting: Internal Medicine

## 2012-09-25 ENCOUNTER — Encounter: Payer: Self-pay | Admitting: Internal Medicine

## 2012-09-25 VITALS — BP 140/79 | HR 70 | Ht 66.0 in | Wt 233.4 lb

## 2012-09-25 DIAGNOSIS — I4891 Unspecified atrial fibrillation: Secondary | ICD-10-CM

## 2012-09-25 MED ORDER — SPIRONOLACTONE 50 MG PO TABS: 25.0000 mg | ORAL_TABLET | Freq: Every day | ORAL | Status: DC

## 2012-09-25 MED ORDER — FUROSEMIDE 40 MG PO TABS: 40.0000 mg | ORAL_TABLET | Freq: Every day | ORAL | Status: DC

## 2012-09-25 NOTE — Progress Notes (Signed)
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  Since his recent afib ablation, the patient reports doing very well. He denies procedure related complications.  He has had no afib.  Today, he denies symptoms of palpitations, chest pain, shortness of breath,  lower extremity edema, dizziness, presyncope, or syncope.  The patient is otherwise without complaint today.   Past Medical History  Diagnosis Date  . External thrombosed hemorrhoids   . Osteoarthrosis, unspecified whether generalized or localized, ankle and foot     of the knees,ankle and foot  . Guillain-Barre syndrome   . Cervicalgia   . Vitamin B 12 deficiency   . Urticaria, unspecified   . Morbid obesity s/p gastric bypass   . Hernia of unspecified site of abdominal cavity without mention of obstruction or gangrene     of abdominal  . Unspecified intestinal malabsorption   . Pancytopenia   . Hypertension   . Sleep apnea     CPAP compliant  . GERD (gastroesophageal reflux disease)   . Persistent atrial fibrillation     a. Pradaxa started 01/2012;  b .s/p TEE/DCCV 02/16/2012; c. recurrent afib->Tikosyn Initiation 03/2012  . Nonischemic cardiomyopathy     a. 01/2012 Echo: EF 35-40%, Diff HK, mild MR;  b. 01/2012 R&L Heart Cath: mildly elevated R heart pressures (pcwp 17) , nl cors.  . Hematuria     a. 01/2012 - to f/u with urology as outpt.  . CVA (cerebral infarction) 05/01/12    on pradaxa at time of stroke, switched to xarelto   Past Surgical History  Procedure Laterality Date  . Gastric bypass  2004  . Umbilical hernia repair      x 2  . Hernia repair    . Tee without cardioversion  02/16/2012    Procedure: TRANSESOPHAGEAL ECHOCARDIOGRAM (TEE);  Surgeon: [name], MD;  Location: [facility] ENDOSCOPY;  Service: Cardiovascular;  Laterality: N/A;  10a CV  . Cardioversion  02/16/2012    Procedure: CARDIOVERSION;  Surgeon: [name], MD;  Location: [facility] ENDOSCOPY;  Service: Cardiovascular;  Laterality: N/A;  . Cardioversion  04/22/2012    Procedure: CARDIOVERSION;  Surgeon: [name], MD;  Location: [facility] ENDOSCOPY;  Service: Cardiovascular;  Laterality: N/A;  . Tee without cardioversion  06/24/2012    Procedure: TRANSESOPHAGEAL ECHOCARDIOGRAM (TEE);  Surgeon: [name], MD;  Location: [facility] ENDOSCOPY;  Service: Cardiovascular;  Laterality: N/A;  . Atrial fibrillation ablation  06/25/2012    PVI by Dr [name]    Current Outpatient Prescriptions  Medication Sig Dispense Refill  . calcium citrate (CALCITRATE - DOSED IN MG ELEMENTAL CALCIUM) 950 MG tablet Take 1 tablet by mouth daily.      . furosemide (LASIX) 40 MG tablet Take 1 tablet (40 mg total) by mouth 2 (two) times daily.  60 tablet  6  . Multiple Vitamin (MULTIVITAMIN) tablet Take 3 tablets by mouth daily.       . Rivaroxaban (XARELTO) 20 MG TABS Take 1 tablet (20 mg total) by mouth daily.  30 tablet  6  . spironolactone (ALDACTONE) 50 MG tablet Take 0.5 tablets (25 mg total) by mouth daily.  30 tablet  6  . vitamin B-12 (CYANOCOBALAMIN) 1000 MCG tablet Take 1,000 mcg by mouth daily.      . vitamin E (VITAMIN E) 400 UNIT capsule Take 400 Units by mouth daily.      Marland Kitchen  losartan (COZAAR) 50 MG tablet TAKE ONE TABLET BY MOUTH TWICE DAILY  60 tablet  5   No current facility-administered medications for this visit.    Physical Exam: Filed Vitals:   09/25/12 0950  BP: 140/79  Pulse: 70  Height: 5\' 6"  (1.676 m)  Weight: 233 lb 6.4 oz (105.87 kg)    GEN- The patient is well appearing, alert and oriented x 3 today.   Head- normocephalic, atraumatic Eyes-  Sclera clear, conjunctiva pink Ears- hearing intact Oropharynx- clear Lungs- Clear to ausculation bilaterally, normal work of breathing Heart- Regular rate and rhythm, no murmurs, rubs or gallops, PMI not laterally displaced GI- soft, NT, ND, + BS Extremities- no clubbing, cyanosis, or edema  ekg  today reveals sinus rhythm 65 bpm, incomplete RBBB, LVH  Assessment and Plan:  1. afib Doing well s/p afib ablation without recurrence Given prior stroke, would continue anticoagulation with xarelto Stop amiodarone  2. Nonischemic CM euvolemic Decrease lasix to 40mg  daily Continue daily weights Repeat echo upon return  3. Obesity Weight loss advised

## 2012-09-25 NOTE — Patient Instructions (Signed)
Your physician recommends that you schedule a follow-up appointment in: 3 months with Dr [name]   Your physician has recommended you make the following change in your medication:  1) Stop Amiodarone 2) Decrease Furosemide to 40mg  daily   Weigh daily and keep log

## 2012-12-03 ENCOUNTER — Other Ambulatory Visit (HOSPITAL_COMMUNITY): Payer: Self-pay | Admitting: Cardiology

## 2013-01-01 ENCOUNTER — Encounter: Payer: Self-pay | Admitting: Internal Medicine

## 2013-01-01 ENCOUNTER — Ambulatory Visit (INDEPENDENT_AMBULATORY_CARE_PROVIDER_SITE_OTHER): Payer: BC Managed Care – PPO | Admitting: Internal Medicine

## 2013-01-01 VITALS — BP 135/92 | HR 60 | Ht 66.0 in | Wt 243.0 lb

## 2013-01-01 DIAGNOSIS — I4891 Unspecified atrial fibrillation: Secondary | ICD-10-CM

## 2013-01-01 MED ORDER — FUROSEMIDE 40 MG PO TABS: 20.0000 mg | ORAL_TABLET | Freq: Every day | ORAL | Status: DC

## 2013-01-01 NOTE — Progress Notes (Signed)
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 He has had no further afib post ablation off of AAD therapy.  Today, he denies symptoms of palpitations, chest pain, shortness of breath,  lower extremity edema, dizziness, presyncope, or syncope.  The patient is otherwise without complaint today.   Past Medical History  Diagnosis Date  . External thrombosed hemorrhoids   . Osteoarthrosis, unspecified whether generalized or localized, ankle and foot     of the knees,ankle and foot  . Guillain-Barre syndrome   . Cervicalgia   . Vitamin B 12 deficiency   . Urticaria, unspecified   . Morbid obesity s/p gastric bypass   . Hernia of unspecified site of abdominal cavity without mention of obstruction or gangrene     of abdominal  . Unspecified intestinal malabsorption   . Pancytopenia   . Hypertension   . Sleep apnea     CPAP compliant  . GERD (gastroesophageal reflux disease)   . Persistent atrial fibrillation     a. Pradaxa started 01/2012;  b .s/p TEE/DCCV 02/16/2012; c. recurrent afib->Tikosyn Initiation 03/2012  . Nonischemic cardiomyopathy     a. 01/2012 Echo: EF 35-40%, Diff HK, mild MR;  b. 01/2012 R&L Heart Cath: mildly elevated R heart pressures (pcwp 17) , nl cors.  . Hematuria     a. 01/2012 - to f/u with urology as outpt.  . CVA (cerebral infarction) 05/01/12    on pradaxa at time of stroke, switched to xarelto   Past Surgical History  Procedure Laterality Date  . Gastric bypass  2004  . Umbilical hernia repair      x 2  . Hernia repair    . Tee without cardioversion  02/16/2012    Procedure: TRANSESOPHAGEAL ECHOCARDIOGRAM (TEE);  Surgeon: [name], MD;  Location: [facility] ENDOSCOPY;  Service: Cardiovascular;  Laterality: N/A;  10a CV  . Cardioversion  02/16/2012    Procedure: CARDIOVERSION;  Surgeon: [name], MD;  Location: [facility] ENDOSCOPY;  Service: Cardiovascular;   Laterality: N/A;  . Cardioversion  04/22/2012    Procedure: CARDIOVERSION;  Surgeon: [name], MD;  Location: [facility] ENDOSCOPY;  Service: Cardiovascular;  Laterality: N/A;  . Tee without cardioversion  06/24/2012    Procedure: TRANSESOPHAGEAL ECHOCARDIOGRAM (TEE);  Surgeon: [name], MD;  Location: [facility] ENDOSCOPY;  Service: Cardiovascular;  Laterality: N/A;  . Atrial fibrillation ablation  06/25/2012    PVI by Dr [name]    Current Outpatient Prescriptions  Medication Sig Dispense Refill  . calcium citrate (CALCITRATE - DOSED IN MG ELEMENTAL CALCIUM) 950 MG tablet Take 1 tablet by mouth daily.      . furosemide (LASIX) 40 MG tablet Take 1 tablet (40 mg total) by mouth daily.  30 tablet  1  . losartan (COZAAR) 50 MG tablet TAKE ONE TABLET BY MOUTH TWICE DAILY  60 tablet  5  . Multiple Vitamin (MULTIVITAMIN) tablet Take 3 tablets by mouth daily.       . Rivaroxaban (XARELTO) 20 MG TABS Take 1 tablet (20 mg total) by mouth daily.  30 tablet  6  . spironolactone (ALDACTONE) 50 MG tablet Take 0.5 tablets (25 mg total) by mouth daily.  30 tablet  6  . vitamin B-12 (CYANOCOBALAMIN) 1000 MCG tablet Take 1,000 mcg by mouth daily.      . vitamin E (VITAMIN E) 400 UNIT capsule Take 400 Units by mouth daily.  No current facility-administered medications for this visit.    Physical Exam: Filed Vitals:   01/01/13 0901  BP: 135/92  Pulse: 60  Height: 5\' 6"  (1.676 m)  Weight: 243 lb (110.224 kg)    GEN- The patient is well appearing, alert and oriented x 3 today.   Head- normocephalic, atraumatic Eyes-  Sclera clear, conjunctiva pink Ears- hearing intact Oropharynx- clear Lungs- Clear to ausculation bilaterally, normal work of breathing Heart- Regular rate and rhythm, no murmurs, rubs or gallops, PMI not laterally displaced GI- soft, NT, ND, + BS Extremities- no clubbing, cyanosis, or edema  ekg today reveals sinus rhythm 63 bpm, incomplete RBBB   Assessment and Plan:  1.  afib Doing well s/p afib ablation without recurrence off of AAD Given prior stroke, would continue anticoagulation with xarelto long term  2. Nonischemic CM euvolemic Decrease lasix to 20mg  daily Continue daily weights Repeat echo upon return  3. Obesity Weight loss advised

## 2013-01-01 NOTE — Patient Instructions (Addendum)
Your physician wants you to follow-up in: 6 months with Dr Jacquiline Doe will receive a reminder letter in the mail two months in advance. If you don't receive a letter, please call our office to schedule the follow-up appointment.   Your physician has recommended you make the following change in your medication:  1) Decrease Furosemide to 20mg  daily

## 2013-02-07 ENCOUNTER — Telehealth: Payer: Self-pay | Admitting: Internal Medicine

## 2013-02-07 DIAGNOSIS — I10 Essential (primary) hypertension: Secondary | ICD-10-CM

## 2013-02-07 MED ORDER — SPIRONOLACTONE 50 MG PO TABS: 50.0000 mg | ORAL_TABLET | Freq: Every day | ORAL | Status: DC

## 2013-02-07 MED ORDER — LOSARTAN POTASSIUM 50 MG PO TABS: 50.0000 mg | ORAL_TABLET | Freq: Two times a day (BID) | ORAL | Status: DC

## 2013-02-07 NOTE — Telephone Encounter (Signed)
New Prob    States pt is having some BP issues, would like to speak to nurse regarding this. Please call.

## 2013-02-07 NOTE — Telephone Encounter (Signed)
Dr Antoine Poche is the patients primary cardiologist.   I will forward results to him as I am away for the next week.  The patient will need very close K follow-up on spironolactone 50mg  daily.

## 2013-02-07 NOTE — Telephone Encounter (Signed)
[name] called regarding pt needing a refill for Losartan 50 mg twice a day. Prescription sent to [facility] in [place]. [name] aware.  [name] also states she is concern because pt's BP has been going up for the last couple of weeks. BP has been from 150/105 to 158-160 /105. Today has been 142/102. Pt's BP usually runs in the 115/75. Dr.. [name] DOD recommends for pt to increased spironolactone to one tablet ( 50 mg) once a day a prescription sent to [facility], [facility] in [place] . Pt to have BMET lab work in 2 weeks. [name] aware. Pt has an appointment for labs on August 1 st 2014 in AM pt/ wife aware.

## 2013-02-12 NOTE — Telephone Encounter (Signed)
Check a BMET next week and then again in one month.

## 2013-02-12 NOTE — Telephone Encounter (Signed)
Scheduled for August 1 and will repeat in 1 month.

## 2013-02-21 ENCOUNTER — Other Ambulatory Visit (INDEPENDENT_AMBULATORY_CARE_PROVIDER_SITE_OTHER): Payer: BC Managed Care – PPO

## 2013-02-21 DIAGNOSIS — I10 Essential (primary) hypertension: Secondary | ICD-10-CM

## 2013-02-21 LAB — BASIC METABOLIC PANEL
BUN: 10 mg/dL (ref 6–23)
CO2: 28 mEq/L (ref 19–32)
Calcium: 8.6 mg/dL (ref 8.4–10.5)
Chloride: 106 mEq/L (ref 96–112)
Creatinine, Ser: 0.9 mg/dL (ref 0.4–1.5)
GFR: 100.73 mL/min (ref >60.00)
Glucose, Bld: 92 mg/dL (ref 70–99)
Potassium: 3.8 mEq/L (ref 3.5–5.1)
Sodium: 139 mEq/L (ref 135–145)

## 2013-03-04 ENCOUNTER — Other Ambulatory Visit: Payer: Self-pay

## 2013-03-04 MED ORDER — FUROSEMIDE 40 MG PO TABS: 20.0000 mg | ORAL_TABLET | Freq: Every day | ORAL | Status: DC

## 2013-03-06 ENCOUNTER — Other Ambulatory Visit: Payer: Self-pay | Admitting: *Deleted

## 2013-03-06 ENCOUNTER — Telehealth: Payer: Self-pay | Admitting: *Deleted

## 2013-03-06 MED ORDER — FUROSEMIDE 40 MG PO TABS: 20.0000 mg | ORAL_TABLET | Freq: Every day | ORAL | Status: DC

## 2013-03-06 NOTE — Telephone Encounter (Signed)
called to let Tyrone [name] know that we sent refill.

## 2013-03-29 ENCOUNTER — Other Ambulatory Visit: Payer: Self-pay | Admitting: Cardiology

## 2013-03-31 ENCOUNTER — Telehealth: Payer: Self-pay | Admitting: Internal Medicine

## 2013-03-31 NOTE — Telephone Encounter (Deleted)
Error

## 2013-04-28 ENCOUNTER — Other Ambulatory Visit: Payer: Self-pay | Admitting: Cardiology

## 2013-04-28 ENCOUNTER — Telehealth: Payer: Self-pay | Admitting: *Deleted

## 2013-04-28 MED ORDER — RIVAROXABAN 20 MG PO TABS: 20.0000 mg | ORAL_TABLET | Freq: Every day | ORAL | Status: DC

## 2013-04-28 NOTE — Telephone Encounter (Signed)
Refill

## 2013-05-29 ENCOUNTER — Other Ambulatory Visit: Payer: Self-pay

## 2013-07-30 ENCOUNTER — Ambulatory Visit (INDEPENDENT_AMBULATORY_CARE_PROVIDER_SITE_OTHER): Payer: BC Managed Care – PPO | Admitting: Internal Medicine

## 2013-07-30 ENCOUNTER — Encounter: Payer: Self-pay | Admitting: Internal Medicine

## 2013-07-30 VITALS — BP 159/96 | HR 58 | Ht 66.0 in | Wt 242.0 lb

## 2013-07-30 DIAGNOSIS — I5042 Chronic combined systolic (congestive) and diastolic (congestive) heart failure: Secondary | ICD-10-CM

## 2013-07-30 DIAGNOSIS — I495 Sick sinus syndrome: Secondary | ICD-10-CM

## 2013-07-30 DIAGNOSIS — I10 Essential (primary) hypertension: Secondary | ICD-10-CM

## 2013-07-30 DIAGNOSIS — I428 Other cardiomyopathies: Secondary | ICD-10-CM

## 2013-07-30 DIAGNOSIS — I6340 Cerebral infarction due to embolism of unspecified cerebral artery: Secondary | ICD-10-CM

## 2013-07-30 DIAGNOSIS — I4891 Unspecified atrial fibrillation: Secondary | ICD-10-CM

## 2013-07-30 DIAGNOSIS — E669 Obesity, unspecified: Secondary | ICD-10-CM

## 2013-07-30 MED ORDER — AMLODIPINE BESYLATE 5 MG PO TABS
5.0000 mg | ORAL_TABLET | Freq: Every day | ORAL | Status: DC
Start: 2013-07-30 — End: 2014-04-20

## 2013-07-30 NOTE — Patient Instructions (Signed)
Your physician recommends that you schedule a follow-up appointment in: 4 weeks with Lori Gerhardt,NP and 6 months with Dr Allred  Your physician has recommended you make the following change in your medication:  1) Start Amlodipine 5mg daily    

## 2013-08-03 NOTE — Progress Notes (Signed)
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  He is doing well. He has had no further afib post ablation off of AAD therapy.  Today, he denies symptoms of palpitations, chest pain, shortness of breath,  lower extremity edema, dizziness, presyncope, or syncope.  The patient is otherwise without complaint today.   His concern today is with increased blood pressure.  He has had increased salt over the holidays and has gained weight also.  He has not been compliant with lifestyle modification.  He says that his stress level is quite high.  Past Medical History  Diagnosis Date  . External thrombosed hemorrhoids   . Osteoarthrosis, unspecified whether generalized or localized, ankle and foot     of the knees,ankle and foot  . Guillain-Barre syndrome   . Cervicalgia   . Vitamin B 12 deficiency   . Urticaria, unspecified   . Morbid obesity s/p gastric bypass   . Hernia of unspecified site of abdominal cavity without mention of obstruction or gangrene     of abdominal  . Unspecified intestinal malabsorption   . Pancytopenia   . Hypertension   . Sleep apnea     CPAP compliant  . GERD (gastroesophageal reflux disease)   . Persistent atrial fibrillation     a. Pradaxa started 01/2012;  b .s/p TEE/DCCV 02/16/2012; c. recurrent afib->Tikosyn Initiation 03/2012  . Nonischemic cardiomyopathy     a. 01/2012 Echo: EF 35-40%, Diff HK, mild MR;  b. 01/2012 R&L Heart Cath: mildly elevated R heart pressures (pcwp 17) , nl cors.  . Hematuria     a. 01/2012 - to f/u with urology as outpt.  . CVA (cerebral infarction) 05/01/12    on pradaxa at time of stroke, switched to xarelto   Past Surgical History  Procedure Laterality Date  . Gastric bypass  2004  . Umbilical hernia repair      x 2  . Hernia repair    . Tee without cardioversion  02/16/2012    Procedure: TRANSESOPHAGEAL ECHOCARDIOGRAM (TEE);  Surgeon:  [name], MD;  Location: [facility] ENDOSCOPY;  Service: Cardiovascular;  Laterality: N/A;  10a CV  . Cardioversion  02/16/2012    Procedure: CARDIOVERSION;  Surgeon: [name], MD;  Location: [facility] ENDOSCOPY;  Service: Cardiovascular;  Laterality: N/A;  . Cardioversion  04/22/2012    Procedure: CARDIOVERSION;  Surgeon: [name], MD;  Location: [facility] ENDOSCOPY;  Service: Cardiovascular;  Laterality: N/A;  . Tee without cardioversion  06/24/2012    Procedure: TRANSESOPHAGEAL ECHOCARDIOGRAM (TEE);  Surgeon: [name], MD;  Location: [facility] ENDOSCOPY;  Service: Cardiovascular;  Laterality: N/A;  . Atrial fibrillation ablation  06/25/2012    PVI by Dr [name]    Current Outpatient Prescriptions  Medication Sig Dispense Refill  . calcium citrate (CALCITRATE - DOSED IN MG ELEMENTAL CALCIUM) 950 MG tablet Take 1 tablet by mouth daily.      . furosemide (LASIX) 40 MG tablet Take 0.5 tablets (20 mg total) by mouth daily.  30 tablet  4  . losartan (COZAAR) 50 MG tablet Take 1 tablet (50 mg total) by mouth 2 (two) times daily.  60 tablet  5  . Multiple Vitamin (MULTIVITAMIN) tablet Take 1 tablet by mouth daily.       . Rivaroxaban (XARELTO) 20 MG TABS tablet Take 1 tablet (20 mg total) by mouth daily with supper.  30 tablet  6  .  spironolactone (ALDACTONE) 50 MG tablet Take 1 tablet (50 mg total) by mouth daily.  30 tablet  6  . vitamin B-12 (CYANOCOBALAMIN) 1000 MCG tablet Take 1,000 mcg by mouth daily.      . vitamin E (VITAMIN E) 400 UNIT capsule Take 400 Units by mouth daily.      Marland Kitchen. amLODipine (NORVASC) 5 MG tablet Take 1 tablet (5 mg total) by mouth daily.  90 tablet  3   No current facility-administered medications for this visit.    Physical Exam: Filed Vitals:   07/30/13 1454  BP: 159/96  Pulse: 58  Height: 5\' 6"  (1.676 m)  Weight: 242 lb (109.77 kg)    GEN- The patient is well appearing, alert and oriented x 3 today.   Head- normocephalic, atraumatic Eyes-  Sclera clear,  conjunctiva pink Ears- hearing intact Oropharynx- clear Lungs- Clear to ausculation bilaterally, normal work of breathing Heart- Regular rate and rhythm, no murmurs, rubs or gallops, PMI not laterally displaced GI- soft, NT, ND, + BS Extremities- no clubbing, cyanosis, or edema  ekg today reveals sinus rhythm 58 bpm, incomplete RBBB , LVH with repolarization abnormality  Assessment and Plan:  1. afib Doing well s/p afib ablation without recurrence off of AAD. No changes today Given prior stroke, would continue anticoagulation with xarelto long term  2. Nonischemic CM euvolemic No changes Repeat echo   3. Obesity Weight loss advised  4. HTN Above goal 2 gram sodium restriction and lifestyle modification including weight loss are advised Add amlodipine 5mg  daily today.  Return to see Norma FredricksonLori Gerhardt in 4 weeks for blood pressure management I will see in 6 months

## 2013-08-04 ENCOUNTER — Other Ambulatory Visit: Payer: Self-pay | Admitting: Internal Medicine

## 2013-08-05 ENCOUNTER — Other Ambulatory Visit: Payer: Self-pay | Admitting: *Deleted

## 2013-08-05 DIAGNOSIS — I4891 Unspecified atrial fibrillation: Secondary | ICD-10-CM

## 2013-08-05 DIAGNOSIS — I10 Essential (primary) hypertension: Secondary | ICD-10-CM

## 2013-08-06 ENCOUNTER — Telehealth: Payer: Self-pay | Admitting: Internal Medicine

## 2013-08-06 NOTE — Telephone Encounter (Signed)
Follow Up:  Tyrone [name] called to set up echo appt. Tyrone [name] is wanting to know why Dr. [name] wants him to have an Echo. Tyrone [name] is requesting a call back.

## 2013-08-06 NOTE — Telephone Encounter (Signed)
Spoke with patient and the echo is scheduled for 08/13/13

## 2013-08-06 NOTE — Telephone Encounter (Signed)
New message     Pt got a msg that Dr [name] want to schedule an echo.  He was just here last week and he did not say anything about an echo.  Did he find something?

## 2013-08-06 NOTE — Telephone Encounter (Signed)
left message for patient to call me back.  Just need to get the echo to compare to the one done in 2013 with afib and CM

## 2013-08-13 ENCOUNTER — Other Ambulatory Visit (HOSPITAL_COMMUNITY): Payer: BC Managed Care – PPO

## 2013-08-14 ENCOUNTER — Ambulatory Visit: Payer: BC Managed Care – PPO | Admitting: Internal Medicine

## 2013-08-27 ENCOUNTER — Encounter: Payer: Self-pay | Admitting: Nurse Practitioner

## 2013-08-27 ENCOUNTER — Ambulatory Visit (INDEPENDENT_AMBULATORY_CARE_PROVIDER_SITE_OTHER): Payer: BC Managed Care – PPO | Admitting: Nurse Practitioner

## 2013-08-27 VITALS — BP 110/80 | HR 62 | Ht 66.0 in | Wt 242.6 lb

## 2013-08-27 DIAGNOSIS — I428 Other cardiomyopathies: Secondary | ICD-10-CM

## 2013-08-27 DIAGNOSIS — I10 Essential (primary) hypertension: Secondary | ICD-10-CM

## 2013-08-27 LAB — CBC
HCT: 37 % — ABNORMAL LOW (ref 39.0–52.0)
Hemoglobin: 12.1 g/dL — ABNORMAL LOW (ref 13.0–17.0)
MCHC: 32.8 g/dL (ref 30.0–36.0)
MCV: 93.9 fl (ref 78.0–100.0)
Platelets: 190 10*3/uL (ref 150.0–400.0)
RBC: 3.94 Mil/uL — ABNORMAL LOW (ref 4.22–5.81)
RDW: 14.4 % (ref 11.5–14.6)
WBC: 5.1 10*3/uL (ref 4.5–10.5)

## 2013-08-27 LAB — BASIC METABOLIC PANEL
BUN: 11 mg/dL (ref 6–23)
CO2: 28 mEq/L (ref 19–32)
Calcium: 8.5 mg/dL (ref 8.4–10.5)
Chloride: 103 mEq/L (ref 96–112)
Creatinine, Ser: 0.9 mg/dL (ref 0.4–1.5)
GFR: 101.88 mL/min (ref >60.00)
Glucose, Bld: 88 mg/dL (ref 70–99)
Potassium: 4.4 mEq/L (ref 3.5–5.1)
Sodium: 138 mEq/L (ref 135–145)

## 2013-08-27 LAB — LIPID PANEL
Cholesterol: 173 mg/dL (ref 0–200)
HDL: 78.4 mg/dL (ref >39.00)
LDL Cholesterol: 88 mg/dL (ref 0–99)
Total CHOL/HDL Ratio: 2
Triglycerides: 31 mg/dL (ref 0.0–149.0)
VLDL: 6.2 mg/dL (ref 0.0–40.0)

## 2013-08-27 LAB — HEPATIC FUNCTION PANEL
ALT: 16 U/L (ref 0–53)
AST: 17 U/L (ref 0–37)
Albumin: 3.9 g/dL (ref 3.5–5.2)
Alkaline Phosphatase: 66 U/L (ref 39–117)
Bilirubin, Direct: 0.1 mg/dL (ref 0.0–0.3)
Total Bilirubin: 0.6 mg/dL (ref 0.3–1.2)
Total Protein: 6.7 g/dL (ref 6.0–8.3)

## 2013-08-27 NOTE — Patient Instructions (Addendum)
Stay on your current medicines  Continue to monitor your blood pressure at home - call us for readings if too low or too high - goal is to be around 135/85  We will check labs today  Try to be more active   See Dr. [name] in 6 months  Call the [facility] office at [phone] if you have any questions, problems or concerns.

## 2013-08-27 NOTE — Progress Notes (Signed)
Tyrone [name] Date of Birth: 26-Jul-1965 Medical Record #[id number]  History of Present Illness: Tyrone [name] is seen back today for a one month check. Seen for Tyrone [name]. He has had AF and has had ablation in 2013 and remains off of AAD. Other issues inclue HTN, sleep apnea, guillain-barre sydrome, OA, morbid obesity with past gastric bypass, GERD, CVA in 2013 and NICM with past EF down to 35 to 40% but recovered and now normal per echo January 2015.   Last seen a month ago - weight was up. Not compliant with lifestyle modifications. He is to continue with long term anticoagulation due to his stroke. BP was high - Norvasc was added. Echo updated - had done in the [place] office - EF normal. Mild LVH noted.   Comes back today. Here alone. Doing ok. BP much better. He has made some "work related adjustments" to help with his stress level. No chest pain. Not short of breath. Feels good on his medicines. Does seem motivated to start making changes. Echo reviewed with him today.    Current Outpatient Prescriptions  Medication Sig Dispense Refill  . amLODipine (NORVASC) 5 MG tablet Take 1 tablet (5 mg total) by mouth daily.  90 tablet  3  . calcium citrate (CALCITRATE - DOSED IN MG ELEMENTAL CALCIUM) 950 MG tablet Take 1 tablet by mouth daily.      . furosemide (LASIX) 40 MG tablet Take 0.5 tablets (20 mg total) by mouth daily.  30 tablet  4  . losartan (COZAAR) 50 MG tablet TAKE ONE TABLET BY MOUTH TWICE DAILY  60 tablet  0  . Multiple Vitamin (MULTIVITAMIN) tablet Take 1 tablet by mouth daily.       . Rivaroxaban (XARELTO) 20 MG TABS tablet Take 1 tablet (20 mg total) by mouth daily with supper.  30 tablet  6  . spironolactone (ALDACTONE) 50 MG tablet Take 1 tablet (50 mg total) by mouth daily.  30 tablet  6  . vitamin B-12 (CYANOCOBALAMIN) 1000 MCG tablet Take 1,000 mcg by mouth daily.      . vitamin E (VITAMIN E) 400 UNIT capsule Take 400 Units by mouth daily.       No current facility-administered  medications for this visit.    No Known Allergies  Past Medical History  Diagnosis Date  . External thrombosed hemorrhoids   . Osteoarthrosis, unspecified whether generalized or localized, ankle and foot     of the knees,ankle and foot  . Guillain-Barre syndrome   . Cervicalgia   . Vitamin B 12 deficiency   . Urticaria, unspecified   . Morbid obesity s/p gastric bypass   . Hernia of unspecified site of abdominal cavity without mention of obstruction or gangrene     of abdominal  . Unspecified intestinal malabsorption   . Pancytopenia   . Hypertension   . Sleep apnea     CPAP compliant  . GERD (gastroesophageal reflux disease)   . Persistent atrial fibrillation     a. Pradaxa started 01/2012;  b .s/p TEE/DCCV 02/16/2012; c. recurrent afib->Tikosyn Initiation 03/2012  . Nonischemic cardiomyopathy     a. 01/2012 Echo: EF 35-40%, Diff HK, mild MR;  b. 01/2012 R&L Heart Cath: mildly elevated R heart pressures (pcwp 17) , nl cors.  . Hematuria     a. 01/2012 - to f/u with urology as outpt.  . CVA (cerebral infarction) 05/01/12    on pradaxa at time of stroke, switched to xarelto  Past Surgical History  Procedure Laterality Date  . Gastric bypass  2004  . Umbilical hernia repair      x 2  . Hernia repair    . Tee without cardioversion  02/16/2012    Procedure: TRANSESOPHAGEAL ECHOCARDIOGRAM (TEE);  Surgeon: [name], MD;  Location: [facility] ENDOSCOPY;  Service: Cardiovascular;  Laterality: N/A;  10a CV  . Cardioversion  02/16/2012    Procedure: CARDIOVERSION;  Surgeon: [name], MD;  Location: [facility] ENDOSCOPY;  Service: Cardiovascular;  Laterality: N/A;  . Cardioversion  04/22/2012    Procedure: CARDIOVERSION;  Surgeon: [name], MD;  Location: [facility] ENDOSCOPY;  Service: Cardiovascular;  Laterality: N/A;  . Tee without cardioversion  06/24/2012    Procedure: TRANSESOPHAGEAL ECHOCARDIOGRAM (TEE);  Surgeon: [name], MD;  Location: [facility] ENDOSCOPY;  Service: Cardiovascular;   Laterality: N/A;  . Atrial fibrillation ablation  06/25/2012    PVI by Dr Tyrone Frame    History  Smoking status  . Never Smoker   Smokeless tobacco  . Former Neurosurgeon  . Types: Chew  . Quit date: 11/11/2002    History  Alcohol Use  . Yes    Comment: occasional    Family History  Problem Relation Age of Onset  . Hypertension Mother   . Hypertension Father   . Heart attack Father 44    Died  . Diabetes Father   . Kidney disease Father     with transplant    Review of Systems: The review of systems is per the HPI.  All other systems were reviewed and are negative.  Physical Exam: BP 110/80  Pulse 62  Ht 5\' 6"  (1.676 m)  Wt 242 lb 9.6 oz (110.043 kg)  BMI 39.18 kg/m2  SpO2 96% Patient is very pleasant and in no acute distress. Morbidly obese. Skin is warm and dry. Color is normal.  HEENT is unremarkable. Normocephalic/atraumatic. PERRL. Sclera are nonicteric. Neck is supple. No masses. No JVD. Lungs are clear. Cardiac exam shows a regular rate and rhythm. Abdomen is soft. Extremities are without edema. Gait and ROM are intact. No gross neurologic deficits noted.  Wt Readings from Last 3 Encounters:  08/27/13 242 lb 9.6 oz (110.043 kg)  07/30/13 242 lb (109.77 kg)  01/01/13 243 lb (110.224 kg)     LABORATORY DATA: PENDING  Lab Results  Component Value Date   WBC 4.0* 06/18/2012   HGB 10.6* 06/18/2012   HCT 32.6* 06/18/2012   PLT 138.0* 06/18/2012   GLUCOSE 92 02/21/2013   CHOL 153 05/01/2012   TRIG 56 05/01/2012   HDL 62 05/01/2012   LDLCALC 80 05/01/2012   ALT 35 05/10/2012   AST 21 05/10/2012   NA 139 02/21/2013   K 3.8 02/21/2013   CL 106 02/21/2013   CREATININE 0.9 02/21/2013   BUN 10 02/21/2013   CO2 28 02/21/2013   TSH 2.53 05/10/2012   INR 1.29 05/01/2012   HGBA1C 5.6 05/01/2012     Assessment / Plan: 1. HTN - BP has improved greatly. No change in his current regimen. He will continue to monitor at home.   2. AF - s/p ablation - off of AAD - remains on long term  anticoagulation  3. Morbid obesity  4. NICM - EF has recovered per recent echo  Needs to work on CV risk factors - discussed in depth today. Will update his labs today as well - he is fasting. See him back in 6 months.    Patient  is agreeable to this plan and will call if any problems develop in the interim.   Rosalio Macadamia, RN, ANP-C [facility] [address] [place], [place]  [address] [phone]

## 2013-09-01 ENCOUNTER — Other Ambulatory Visit: Payer: Self-pay | Admitting: Internal Medicine

## 2013-09-01 ENCOUNTER — Other Ambulatory Visit: Payer: Self-pay | Admitting: *Deleted

## 2013-09-01 MED ORDER — LOSARTAN POTASSIUM 50 MG PO TABS: ORAL_TABLET | ORAL | Status: DC

## 2013-09-23 ENCOUNTER — Other Ambulatory Visit: Payer: Self-pay | Admitting: Internal Medicine

## 2013-10-14 ENCOUNTER — Telehealth: Payer: Self-pay | Admitting: Internal Medicine

## 2013-10-14 NOTE — Telephone Encounter (Signed)
New message     Talk to the nurse about a medication his PCP prescribed.

## 2013-10-14 NOTE — Telephone Encounter (Signed)
Vit D level was low at 17.1  His PCP wants his to take 1,000u daily of Vit D   Is going to start this and let us know if there is a problem

## 2013-11-14 ENCOUNTER — Other Ambulatory Visit: Payer: Self-pay

## 2013-11-14 MED ORDER — RIVAROXABAN 20 MG PO TABS: 20.0000 mg | ORAL_TABLET | Freq: Every day | ORAL | Status: DC

## 2014-01-25 ENCOUNTER — Other Ambulatory Visit: Payer: Self-pay | Admitting: Internal Medicine

## 2014-02-20 ENCOUNTER — Other Ambulatory Visit: Payer: Self-pay | Admitting: Internal Medicine

## 2014-03-05 ENCOUNTER — Other Ambulatory Visit: Payer: Self-pay | Admitting: Internal Medicine

## 2014-03-20 ENCOUNTER — Other Ambulatory Visit: Payer: Self-pay | Admitting: Internal Medicine

## 2014-04-03 ENCOUNTER — Other Ambulatory Visit: Payer: Self-pay | Admitting: Internal Medicine

## 2014-04-20 ENCOUNTER — Ambulatory Visit (INDEPENDENT_AMBULATORY_CARE_PROVIDER_SITE_OTHER): Payer: BC Managed Care – PPO | Admitting: Internal Medicine

## 2014-04-20 ENCOUNTER — Encounter: Payer: Self-pay | Admitting: Internal Medicine

## 2014-04-20 VITALS — BP 100/82 | HR 56 | Ht 66.0 in | Wt 256.6 lb

## 2014-04-20 DIAGNOSIS — I480 Paroxysmal atrial fibrillation: Secondary | ICD-10-CM

## 2014-04-20 DIAGNOSIS — I428 Other cardiomyopathies: Secondary | ICD-10-CM

## 2014-04-20 DIAGNOSIS — I10 Essential (primary) hypertension: Secondary | ICD-10-CM

## 2014-04-20 DIAGNOSIS — E669 Obesity, unspecified: Secondary | ICD-10-CM

## 2014-04-20 DIAGNOSIS — I4891 Unspecified atrial fibrillation: Secondary | ICD-10-CM

## 2014-04-20 LAB — CBC
HCT: 33.9 % — ABNORMAL LOW (ref 39.0–52.0)
Hemoglobin: 11.1 g/dL — ABNORMAL LOW (ref 13.0–17.0)
MCHC: 32.9 g/dL (ref 30.0–36.0)
MCV: 89.8 fl (ref 78.0–100.0)
Platelets: 197 10*3/uL (ref 150.0–400.0)
RBC: 3.77 Mil/uL — ABNORMAL LOW (ref 4.22–5.81)
RDW: 15.4 % (ref 11.5–15.5)
WBC: 4.9 10*3/uL (ref 4.0–10.5)

## 2014-04-20 LAB — BASIC METABOLIC PANEL
BUN: 17 mg/dL (ref 6–23)
CO2: 25 mEq/L (ref 19–32)
Calcium: 8.4 mg/dL (ref 8.4–10.5)
Chloride: 104 mEq/L (ref 96–112)
Creatinine, Ser: 0.9 mg/dL (ref 0.4–1.5)
GFR: 91.59 mL/min (ref >60.00)
Glucose, Bld: 95 mg/dL (ref 70–99)
Potassium: 4.3 mEq/L (ref 3.5–5.1)
Sodium: 135 mEq/L (ref 135–145)

## 2014-04-20 MED ORDER — SPIRONOLACTONE 50 MG PO TABS: ORAL_TABLET | ORAL | Status: DC

## 2014-04-20 MED ORDER — LOSARTAN POTASSIUM 50 MG PO TABS: ORAL_TABLET | ORAL | Status: DC

## 2014-04-20 MED ORDER — AMLODIPINE BESYLATE 5 MG PO TABS: 5.0000 mg | ORAL_TABLET | Freq: Every day | ORAL | Status: DC

## 2014-04-20 MED ORDER — FUROSEMIDE 40 MG PO TABS: 40.0000 mg | ORAL_TABLET | Freq: Every day | ORAL | Status: DC

## 2014-04-20 MED ORDER — RIVAROXABAN 20 MG PO TABS: 20.0000 mg | ORAL_TABLET | Freq: Every day | ORAL | Status: DC

## 2014-04-20 NOTE — Addendum Note (Signed)
Addended by: Lendon Ka on: 04/20/2014 11:31 AM   Modules accepted: Orders

## 2014-04-20 NOTE — Patient Instructions (Signed)
Your physician recommends that you return for lab work in:  TODAY (BMET, CBC)  Your physician wants you to follow-up in:  6 MONTHS WITH DONNA CARROLL, NP You will receive a reminder letter in the mail two months in advance. If you don't receive a letter, please call our office to schedule the follow-up appointment. Your physician wants you to follow-up in: 12 MONTHS WITH DR [name].  You will receive a reminder letter in the mail two months in advance. If you don't receive a letter, please call our office to schedule the follow-up appointment.

## 2014-04-20 NOTE — Addendum Note (Signed)
Addended by: Lendon Ka on: 04/20/2014 12:02 PM   Modules accepted: Orders

## 2014-04-20 NOTE — Progress Notes (Signed)
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  He is doing well. He has had no further afib post ablation off of AAD therapy.  He has not been very successful with weight reduction and exercise efforts. Today, he denies symptoms of palpitations, chest pain, shortness of breath,  lower extremity edema, dizziness, presyncope, or syncope.  The patient is otherwise without complaint today.     Past Medical History  Diagnosis Date  . External thrombosed hemorrhoids   . Osteoarthrosis, unspecified whether generalized or localized, ankle and foot     of the knees,ankle and foot  . Guillain-Barre syndrome   . Cervicalgia   . Vitamin B 12 deficiency   . Urticaria, unspecified   . Morbid obesity s/p gastric bypass   . Hernia of unspecified site of abdominal cavity without mention of obstruction or gangrene     of abdominal  . Unspecified intestinal malabsorption   . Pancytopenia   . Hypertension   . Sleep apnea     CPAP compliant  . GERD (gastroesophageal reflux disease)   . Persistent atrial fibrillation     a. Pradaxa started 01/2012;  b .s/p TEE/DCCV 02/16/2012; c. recurrent afib->Tikosyn Initiation 03/2012  . Nonischemic cardiomyopathy     a. 01/2012 Echo: EF 35-40%, Diff HK, mild MR;  b. 01/2012 R&L Heart Cath: mildly elevated R heart pressures (pcwp 17) , nl cors.  . Hematuria     a. 01/2012 - to f/u with urology as outpt.  . CVA (cerebral infarction) 05/01/12    on pradaxa at time of stroke, switched to xarelto   Past Surgical History  Procedure Laterality Date  . Gastric bypass  2004  . Umbilical hernia repair      x 2  . Hernia repair    . Tee without cardioversion  02/16/2012    Procedure: TRANSESOPHAGEAL ECHOCARDIOGRAM (TEE);  Surgeon: Tyrone Bunting, MD;  Location: [facility] ENDOSCOPY;  Service: Cardiovascular;  Laterality: N/A;  10a CV  . Cardioversion  02/16/2012    Procedure:  CARDIOVERSION;  Surgeon: Tyrone Bunting, MD;  Location: [facility] ENDOSCOPY;  Service: Cardiovascular;  Laterality: N/A;  . Cardioversion  04/22/2012    Procedure: CARDIOVERSION;  Surgeon: Tyrone Salvia, MD;  Location: [facility] ENDOSCOPY;  Service: Cardiovascular;  Laterality: N/A;  . Tee without cardioversion  06/24/2012    Procedure: TRANSESOPHAGEAL ECHOCARDIOGRAM (TEE);  Surgeon: Tyrone M Swaziland, MD;  Location: [facility] ENDOSCOPY;  Service: Cardiovascular;  Laterality: N/A;  . Atrial fibrillation ablation  06/25/2012    PVI by Dr Tyrone [name]   ROS- all systems are reviewed and negative except as per HPI above  Current Outpatient Prescriptions  Medication Sig Dispense Refill  . amLODipine (NORVASC) 5 MG tablet Take 1 tablet (5 mg total) by mouth daily.  90 tablet  3  . calcium citrate (CALCITRATE - DOSED IN MG ELEMENTAL CALCIUM) 950 MG tablet Take 1 tablet by mouth daily.      . furosemide (LASIX) 40 MG tablet TAKE ONE-HALF TABLET BY MOUTH ONCE DAILY  30 tablet  0  . losartan (COZAAR) 50 MG tablet TAKE ONE TABLET BY MOUTH TWICE DAILY  60 tablet  6  . Multiple Vitamin (MULTIVITAMIN) tablet Take 2 tablets in the morning and 1 tablet in the evening      . rivaroxaban (XARELTO) 20 MG TABS tablet Take 1 tablet (20 mg total) by mouth daily with supper.  30  tablet  6  . spironolactone (ALDACTONE) 50 MG tablet TAKE ONE TABLET BY MOUTH ONCE DAILY  30 tablet  3  . vitamin B-12 (CYANOCOBALAMIN) 1000 MCG tablet Take 1,000 mcg by mouth daily.      . vitamin E (VITAMIN E) 400 UNIT capsule Take 400 Units by mouth daily.       No current facility-administered medications for this visit.    Physical Exam: Filed Vitals:   04/20/14 1051  BP: 100/82  Pulse: 56  Height:  (1.676 m)  Weight: 256 lb 9.6 oz (116.393 kg)    GEN- The patient is well appearing, alert and oriented x 3 today.   Head- normocephalic, atraumatic Eyes-  Sclera clear, conjunctiva pink Ears- hearing intact Oropharynx- clear Lungs- Clear to  ausculation bilaterally, normal work of breathing Heart- Regular rate and rhythm, no murmurs, rubs or gallops, PMI not laterally displaced GI- soft, NT, ND, + BS Extremities- no clubbing, cyanosis, or edema  ekg today reveals sinus rhythm 58 bpm, incomplete RBBB , LVH with repolarization abnormality  Assessment and Plan:  1. afib Doing well s/p afib ablation without recurrence off of AAD. No changes today Given prior stroke, would continue anticoagulation with xarelto long term Bmet/ cbc on xarelto today  2. Nonischemic CM EF has improved with sinus rhythm  3. Obesity Weight loss advised.  He is s/p prior gastric bypass. Body mass index is 41.44 kg/(m^2).  4. HTN Stable No change required today Bmet/ today.  Tyrone Coco NP to see in the afib clinic in 6 months for additional lifestyle modificaiton I will see in a year

## 2014-05-05 NOTE — Telephone Encounter (Signed)
error 

## 2014-07-02 ENCOUNTER — Encounter (HOSPITAL_COMMUNITY): Payer: Self-pay | Admitting: Cardiovascular Disease

## 2014-10-29 ENCOUNTER — Encounter (HOSPITAL_COMMUNITY): Payer: Self-pay | Admitting: Nurse Practitioner

## 2014-10-29 ENCOUNTER — Ambulatory Visit (HOSPITAL_COMMUNITY)
Admission: RE | Admit: 2014-10-29 | Discharge: 2014-10-29 | Disposition: A | Discharge status: Home / Self Care | Payer: BC Managed Care – PPO | Source: Ambulatory Visit | Attending: Nurse Practitioner | Admitting: Nurse Practitioner

## 2014-10-29 VITALS — BP 122/90 | HR 55 | Ht 66.0 in | Wt 253.4 lb

## 2014-10-29 DIAGNOSIS — I429 Cardiomyopathy, unspecified: Secondary | ICD-10-CM | POA: Diagnosis not present

## 2014-10-29 DIAGNOSIS — I4891 Unspecified atrial fibrillation: Secondary | ICD-10-CM | POA: Insufficient documentation

## 2014-10-29 DIAGNOSIS — E669 Obesity, unspecified: Secondary | ICD-10-CM | POA: Diagnosis not present

## 2014-10-29 DIAGNOSIS — I10 Essential (primary) hypertension: Secondary | ICD-10-CM | POA: Diagnosis not present

## 2014-10-29 DIAGNOSIS — I481 Persistent atrial fibrillation: Secondary | ICD-10-CM

## 2014-10-29 DIAGNOSIS — I4819 Other persistent atrial fibrillation: Secondary | ICD-10-CM

## 2014-10-29 LAB — BASIC METABOLIC PANEL
Anion gap: 7 (ref 5–15)
BUN: 9 mg/dL (ref 6–23)
CO2: 26 mmol/L (ref 19–32)
Calcium: 8.8 mg/dL (ref 8.4–10.5)
Chloride: 104 mmol/L (ref 96–112)
Creatinine, Ser: 0.83 mg/dL (ref 0.50–1.35)
GFR calc Af Amer: >90 mL/min (ref >90)
GFR calc non Af Amer: >90 mL/min (ref >90)
Glucose, Bld: 103 mg/dL — ABNORMAL HIGH (ref 70–99)
Potassium: 4.2 mmol/L (ref 3.5–5.1)
Sodium: 137 mmol/L (ref 135–145)

## 2014-10-29 LAB — CBC
HCT: 34 % — ABNORMAL LOW (ref 39.0–52.0)
Hemoglobin: 11.1 g/dL — ABNORMAL LOW (ref 13.0–17.0)
MCH: 28.2 pg (ref 26.0–34.0)
MCHC: 32.6 g/dL (ref 30.0–36.0)
MCV: 86.5 fL (ref 78.0–100.0)
Platelets: 191 10*3/uL (ref 150–400)
RBC: 3.93 MIL/uL — ABNORMAL LOW (ref 4.22–5.81)
RDW: 15.4 % (ref 11.5–15.5)
WBC: 3.9 10*3/uL — ABNORMAL LOW (ref 4.0–10.5)

## 2014-10-29 NOTE — Patient Instructions (Signed)
Follow up with allred in one year.

## 2014-10-29 NOTE — Progress Notes (Signed)
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  He is doing well s/p ablation 06/25/12.Has not noticed any afib. Compliant with CPAP. Has gained weight since last visit. Tolerating blood thinner without bleeding issues.   Today, he denies symptoms of palpitations, chest pain, shortness of breath,  lower extremity edema, dizziness, presyncope, or syncope.  The patient is otherwise without complaint today.     Past Medical History  Diagnosis Date  . External thrombosed hemorrhoids   . Osteoarthrosis, unspecified whether generalized or localized, ankle and foot     of the knees,ankle and foot  . Guillain-Barre syndrome   . Cervicalgia   . Vitamin B 12 deficiency   . Urticaria, unspecified   . Morbid obesity s/p gastric bypass   . Hernia of unspecified site of abdominal cavity without mention of obstruction or gangrene     of abdominal  . Unspecified intestinal malabsorption   . Pancytopenia   . Hypertension   . Sleep apnea     CPAP compliant  . GERD (gastroesophageal reflux disease)   . Persistent atrial fibrillation     a. Pradaxa started 01/2012;  b .s/p TEE/DCCV 02/16/2012; c. recurrent afib->Tikosyn Initiation 03/2012  . Nonischemic cardiomyopathy     a. 01/2012 Echo: EF 35-40%, Diff HK, mild MR;  b. 01/2012 R&L Heart Cath: mildly elevated R heart pressures (pcwp 17) , nl cors.  . Hematuria     a. 01/2012 - to f/u with urology as outpt.  . CVA (cerebral infarction) 05/01/12    on pradaxa at time of stroke, switched to xarelto   Past Surgical History  Procedure Laterality Date  . Gastric bypass  2004  . Umbilical hernia repair      x 2  . Hernia repair    . Tee without cardioversion  02/16/2012    Procedure: TRANSESOPHAGEAL ECHOCARDIOGRAM (TEE);  Surgeon: [name], MD;  Location: [facility] ENDOSCOPY;  Service: Cardiovascular;  Laterality: N/A;  10a CV  . Cardioversion  02/16/2012      Procedure: CARDIOVERSION;  Surgeon: [name], MD;  Location: [facility] ENDOSCOPY;  Service: Cardiovascular;  Laterality: N/A;  . Cardioversion  04/22/2012    Procedure: CARDIOVERSION;  Surgeon: [name], MD;  Location: [facility] ENDOSCOPY;  Service: Cardiovascular;  Laterality: N/A;  . Tee without cardioversion  06/24/2012    Procedure: TRANSESOPHAGEAL ECHOCARDIOGRAM (TEE);  Surgeon: [name], MD;  Location: [facility] ENDOSCOPY;  Service: Cardiovascular;  Laterality: N/A;  . Atrial fibrillation ablation  06/25/2012    PVI by Dr [name]  . Left and right heart catheterization with coronary angiogram N/A 02/13/2012    Procedure: LEFT AND RIGHT HEART CATHETERIZATION WITH CORONARY ANGIOGRAM;  Surgeon: Tonny Bollman, MD;  Location: [facility] CATH LAB;  Service: Cardiovascular;  Laterality: N/A;  . Atrial fibrillation ablation N/A 06/25/2012    Procedure: ATRIAL FIBRILLATION ABLATION;  Surgeon: Hillis Range, MD;  Location: [facility] CATH LAB;  Service: Cardiovascular;  Laterality: N/A;   ROS- all systems are reviewed and negative except as per HPI above  Current Outpatient Prescriptions  Medication Sig Dispense Refill  . amLODipine (NORVASC) 5 MG tablet Take 1 tablet (5 mg total) by mouth daily. 90 tablet 3  . calcium citrate (CALCITRATE - DOSED IN MG ELEMENTAL CALCIUM) 950 MG tablet Take 1 tablet by mouth daily.    . furosemide (LASIX) 40 MG tablet Take 1 tablet (40 mg total)  by mouth daily. 90 tablet 3  . losartan (COZAAR) 50 MG tablet TAKE ONE TABLET BY MOUTH TWICE DAILY 180 tablet 3  . Multiple Vitamin (MULTIVITAMIN) tablet Take 2 tablets in the morning and 1 tablet in the evening    . rivaroxaban (XARELTO) 20 MG TABS tablet Take 1 tablet (20 mg total) by mouth daily with supper. 90 tablet 3  . spironolactone (ALDACTONE) 50 MG tablet TAKE ONE TABLET BY MOUTH ONCE DAILY 90 tablet 3  . vitamin B-12 (CYANOCOBALAMIN) 1000 MCG tablet Take 1,000 mcg by mouth daily.    . vitamin E (VITAMIN E) 400 UNIT capsule Take  400 Units by mouth daily.     No current facility-administered medications for this encounter.    Physical Exam: Filed Vitals:   10/29/14 0841  BP: 122/90  Pulse: 55  Height:  (1.676 m)  Weight: 253 lb 6.4 oz (114.941 kg)    GEN- The patient is well appearing, alert and oriented x 3 today.   Head- normocephalic, atraumatic Eyes-  Sclera clear, conjunctiva pink Ears- hearing intact Oropharynx- clear Lungs- Clear to ausculation bilaterally, normal work of breathing Heart- Regular rate and rhythm, no murmurs, rubs or gallops, PMI not laterally displaced GI- soft, NT, ND, + BS Extremities- no clubbing, cyanosis, or edema  ekg today reveals sinus rhythm 55 bpm,  LVH with repolarization abnormality.  Assessment and Plan:  1. afib Doing well s/p afib ablation without recurrence off of AAD. No changes today Given prior stroke, would continue anticoagulation with xarelto long term Bmet/ cbc on xarelto today  2. Nonischemic CM EF has improved with sinus rhythm  3. Obesity Weight loss advised.  He is s/p prior gastric bypass. Body mass index is 40.92 kg/(m^2).  4. HTN Stable No change required today Bmet/ today.  F/u Dr. [name] in one year.       Patient ID: Tyrone [name], male   DOB: 14-May-1966, 49 y.o.   MRN: [id number]

## 2014-10-30 NOTE — Addendum Note (Signed)
Encounter addended by: Wandalee Ferdinand on: 10/30/2014  8:54 AM<BR>     Documentation filed: Charges VN

## 2015-04-06 ENCOUNTER — Other Ambulatory Visit: Payer: Self-pay | Admitting: Internal Medicine

## 2015-04-19 ENCOUNTER — Other Ambulatory Visit: Payer: Self-pay

## 2015-04-19 NOTE — Telephone Encounter (Signed)
Hillis Range, MD at 04/20/2014 11:13 AM  losartan (COZAAR) 50 MG tabletTAKE ONE TABLET BY MOUTH TWICE DAILY  Patient insurance will no longer pay for 50 mg BID

## 2015-04-20 ENCOUNTER — Other Ambulatory Visit: Payer: Self-pay

## 2015-04-20 NOTE — Telephone Encounter (Signed)
yes

## 2015-04-23 MED ORDER — LOSARTAN POTASSIUM 100 MG PO TABS: 100.0000 mg | ORAL_TABLET | Freq: Every day | ORAL | Status: DC

## 2015-04-28 ENCOUNTER — Telehealth: Payer: Self-pay

## 2015-04-28 NOTE — Telephone Encounter (Signed)
Patient's wife called in asking for a refill of Losartan.  Pt has been taking 50 mg 2x daily.  Pharmacy states that insurance doesn't want to pay for 2x daily. Wife says pharmacy  recommended to patient to see if you will prescribe 100 mg tab and let patient cut them in half taking 50 mg twice daily.  Patient only has about 5 days left of 50 mg.  Wife is calling back in the morning to set up appoint for patient to be seen for his 12 month follow up. Please advise.

## 2015-04-29 MED ORDER — LOSARTAN POTASSIUM 100 MG PO TABS: 50.0000 mg | ORAL_TABLET | Freq: Two times a day (BID) | ORAL | Status: DC

## 2015-04-29 NOTE — Telephone Encounter (Signed)
Wife aware  Efraim Kaufmann will call to set up appointment for Allred

## 2015-04-29 NOTE — Telephone Encounter (Signed)
I have sent into the pharmacy to be changed as his insurance will pay this way

## 2015-06-14 ENCOUNTER — Ambulatory Visit (INDEPENDENT_AMBULATORY_CARE_PROVIDER_SITE_OTHER): Payer: BLUE CROSS/BLUE SHIELD | Admitting: Internal Medicine

## 2015-06-14 ENCOUNTER — Encounter: Payer: Self-pay | Admitting: Internal Medicine

## 2015-06-14 VITALS — BP 120/84 | HR 56 | Ht 66.0 in | Wt 254.6 lb

## 2015-06-14 DIAGNOSIS — E669 Obesity, unspecified: Secondary | ICD-10-CM | POA: Diagnosis not present

## 2015-06-14 DIAGNOSIS — I481 Persistent atrial fibrillation: Secondary | ICD-10-CM | POA: Diagnosis not present

## 2015-06-14 DIAGNOSIS — I4819 Other persistent atrial fibrillation: Secondary | ICD-10-CM

## 2015-06-14 DIAGNOSIS — I10 Essential (primary) hypertension: Secondary | ICD-10-CM | POA: Diagnosis not present

## 2015-06-14 DIAGNOSIS — R251 Tremor, unspecified: Secondary | ICD-10-CM

## 2015-06-14 MED ORDER — RIVAROXABAN 20 MG PO TABS: 20.0000 mg | ORAL_TABLET | Freq: Every day | ORAL | Status: DC

## 2015-06-14 NOTE — Patient Instructions (Addendum)
Medication Instructions:  Your physician recommends that you continue on your current medications as directed. Please refer to the Current Medication list given to you today.   Labwork: Your physician recommends that you return for lab work tomorrow: Fasting Lipid and BMET    Testing/Procedures: N/A  Follow-Up: Your physician wants you to follow-up in: 12 months with Dr. [name]. You will receive a reminder letter in the mail two months in advance. If you don't receive a letter, please call our office to schedule the follow-up appointment.   Any Other Special Instructions Will Be Listed Below (If Applicable). You have been referred to Dr. Lucia Gaskins at [facility] for tremor    If you need a refill on your cardiac medications before your next appointment, please call your pharmacy.

## 2015-06-14 NOTE — Progress Notes (Signed)
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  He is doing well. He has had no further afib post ablation off of AAD therapy.  He has not been very successful with weight reduction and exercise efforts.  We discussed this at length again today. Today, he denies symptoms of palpitations, chest pain, shortness of breath,  lower extremity edema, dizziness, presyncope, or syncope. He has had mild L upper extremity tremor with intention.  His wife has been alarmed by this.  The patient is otherwise without complaint today.     Past Medical History  Diagnosis Date  . External thrombosed hemorrhoids   . Osteoarthrosis, unspecified whether generalized or localized, ankle and foot     of the knees,ankle and foot  . Guillain-Barre syndrome (HCC)   . Cervicalgia   . Vitamin B 12 deficiency   . Urticaria, unspecified   . Morbid obesity s/p gastric bypass   . Hernia of unspecified site of abdominal cavity without mention of obstruction or gangrene     of abdominal  . Unspecified intestinal malabsorption   . Pancytopenia   . Hypertension   . Sleep apnea     CPAP compliant  . GERD (gastroesophageal reflux disease)   . Persistent atrial fibrillation (HCC)     a. Pradaxa started 01/2012;  b .s/p TEE/DCCV 02/16/2012; c. recurrent afib->Tikosyn Initiation 03/2012  . Nonischemic cardiomyopathy (HCC)     a. 01/2012 Echo: EF 35-40%, Diff HK, mild MR;  b. 01/2012 R&L Heart Cath: mildly elevated R heart pressures (pcwp 17) , nl cors.  . Hematuria     a. 01/2012 - to f/u with urology as outpt.  . CVA (cerebral infarction) 05/01/12    on pradaxa at time of stroke, switched to xarelto   Past Surgical History  Procedure Laterality Date  . Gastric bypass  2004  . Umbilical hernia repair      x 2  . Hernia repair    . Tee without cardioversion  02/16/2012    Procedure: TRANSESOPHAGEAL ECHOCARDIOGRAM (TEE);  Surgeon:  Tyrone Bunting, MD;  Location: [facility] ENDOSCOPY;  Service: Cardiovascular;  Laterality: N/A;  10a CV  . Cardioversion  02/16/2012    Procedure: CARDIOVERSION;  Surgeon: Tyrone Bunting, MD;  Location: [facility] ENDOSCOPY;  Service: Cardiovascular;  Laterality: N/A;  . Cardioversion  04/22/2012    Procedure: CARDIOVERSION;  Surgeon: Tyrone Salvia, MD;  Location: [facility] ENDOSCOPY;  Service: Cardiovascular;  Laterality: N/A;  . Tee without cardioversion  06/24/2012    Procedure: TRANSESOPHAGEAL ECHOCARDIOGRAM (TEE);  Surgeon: Tyrone M Swaziland, MD;  Location: [facility] ENDOSCOPY;  Service: Cardiovascular;  Laterality: N/A;  . Atrial fibrillation ablation  06/25/2012    PVI by Dr [name]  . Left and right heart catheterization with coronary angiogram N/A 02/13/2012    Procedure: LEFT AND RIGHT HEART CATHETERIZATION WITH CORONARY ANGIOGRAM;  Surgeon: Tyrone Bollman, MD;  Location: [facility] CATH LAB;  Service: Cardiovascular;  Laterality: N/A;  . Atrial fibrillation ablation N/A 06/25/2012    Procedure: ATRIAL FIBRILLATION ABLATION;  Surgeon: Tyrone Range, MD;  Location: [facility] CATH LAB;  Service: Cardiovascular;  Laterality: N/A;   ROS- all systems are reviewed and negative except as per HPI above  Current Outpatient Prescriptions  Medication Sig Dispense Refill  . amLODipine (NORVASC) 5 MG tablet TAKE ONE TABLET BY MOUTH ONCE DAILY 90 tablet 1  . calcium citrate (CALCITRATE - DOSED IN MG ELEMENTAL  CALCIUM) 950 MG tablet Take 1 tablet by mouth daily.    . furosemide (LASIX) 40 MG tablet Take 1 tablet (40 mg total) by mouth daily. 90 tablet 3  . losartan (COZAAR) 100 MG tablet Take 0.5 tablets (50 mg total) by mouth 2 (two) times daily. 90 tablet 3  . Methylcellulose, Laxative, (CITRUCEL PO) Take 1 capsule by mouth daily.    . Multiple Vitamin (MULTIVITAMIN) tablet Take 2 tablets in the morning and 1 tablet in the evening    . rivaroxaban (XARELTO) 20 MG TABS tablet Take 1 tablet (20 mg total) by mouth daily with supper. 90 tablet 3   . spironolactone (ALDACTONE) 50 MG tablet TAKE ONE TABLET BY MOUTH ONCE DAILY 90 tablet 1  . vitamin B-12 (CYANOCOBALAMIN) 1000 MCG tablet Take 1,000 mcg by mouth daily.    . vitamin E (VITAMIN E) 400 UNIT capsule Take 400 Units by mouth daily.     No current facility-administered medications for this visit.    Physical Exam: Filed Vitals:   06/14/15 1558  BP: 120/84  Pulse: 56  Height:  (1.676 m)  Weight: 254 lb 9.6 oz (115.486 kg)    GEN- The patient is well appearing, alert and oriented x 3 today.   Head- normocephalic, atraumatic Eyes-  Sclera clear, conjunctiva pink Ears- hearing intact Oropharynx- clear Lungs- Clear to ausculation bilaterally, normal work of breathing Heart- Regular rate and rhythm, no murmurs, rubs or gallops, PMI not laterally displaced GI- soft, NT, ND, + BS Extremities- no clubbing, cyanosis, or edema Neuro- strength intact, mild L finger dysdiodokinesis  ekg today reveals sinus rhythm   Assessment and Plan:  1. afib Doing well s/p afib ablation without recurrence off of AAD. No changes today Given prior stroke, would continue anticoagulation with xarelto long term  2. Nonischemic CM EF has improved with sinus rhythm No chf symptoms  3. Obesity Weight loss advised.  He is s/p prior gastric bypass.  Regular exercise is encouraged Body mass index is 41.11 kg/(m^2).  4. HTN Stable No change required today  5. Intention tremor Will refer back to neurology as he has not seen them for quite some time.  6. HL Fasting lipids and lfts  Return to see me in 1 year Tyrone Range MD, [facility] 06/14/2015 8:17 PM

## 2015-06-15 ENCOUNTER — Other Ambulatory Visit (INDEPENDENT_AMBULATORY_CARE_PROVIDER_SITE_OTHER): Payer: BLUE CROSS/BLUE SHIELD | Admitting: *Deleted

## 2015-06-15 DIAGNOSIS — I10 Essential (primary) hypertension: Secondary | ICD-10-CM | POA: Diagnosis not present

## 2015-06-15 LAB — LIPID PANEL
Cholesterol: 165 mg/dL (ref 125–200)
HDL: 85 mg/dL (ref >=40)
LDL Cholesterol: 71 mg/dL (ref <130)
Total CHOL/HDL Ratio: 1.9 Ratio (ref <=5.0)
Triglycerides: 46 mg/dL (ref <150)
VLDL: 9 mg/dL (ref <30)

## 2015-06-15 LAB — BASIC METABOLIC PANEL
BUN: 10 mg/dL (ref 7–25)
CO2: 25 mmol/L (ref 20–31)
Calcium: 8.3 mg/dL — ABNORMAL LOW (ref 8.6–10.3)
Chloride: 105 mmol/L (ref 98–110)
Creat: 0.86 mg/dL (ref 0.70–1.33)
Glucose, Bld: 95 mg/dL (ref 65–99)
Potassium: 4.2 mmol/L (ref 3.5–5.3)
Sodium: 139 mmol/L (ref 135–146)

## 2015-06-15 NOTE — Addendum Note (Signed)
Addended by: Tonita Phoenix on: 06/15/2015 07:56 AM   Modules accepted: Orders

## 2015-07-04 ENCOUNTER — Other Ambulatory Visit: Payer: Self-pay | Admitting: Internal Medicine

## 2015-07-05 ENCOUNTER — Ambulatory Visit (INDEPENDENT_AMBULATORY_CARE_PROVIDER_SITE_OTHER): Payer: BLUE CROSS/BLUE SHIELD | Admitting: Neurology

## 2015-07-05 ENCOUNTER — Encounter: Payer: Self-pay | Admitting: Neurology

## 2015-07-05 VITALS — BP 125/79 | HR 60 | Ht 66.0 in | Wt 252.8 lb

## 2015-07-05 DIAGNOSIS — R251 Tremor, unspecified: Secondary | ICD-10-CM | POA: Diagnosis not present

## 2015-07-05 NOTE — Progress Notes (Signed)
[facility]    Provider:  Dr Lucia Gaskins Referring Provider: [name], MD Primary Care Physician:  [name], MD  CC:  Tremors  HPI:  Tyrone [name] is a 49 y.o. male here as a referral from Dr. Sherryll Burger for Tremors. Past medical history of hypertension and atrial fibrillation, gastric bypass,. Her with his wife who also provides information. He noticed it initially when he was handing someone some papers. He has some left-sided weakness after a stroke. He has slight tremor left > right hand mostly while doing something. No associated tremor in the face or legs. He drinks a lot of caffeinated tea, 3-4 cups a day. He had lost over 200 pounds with gastric bypass. He goes to Dr. Clent Ridges in [place] for the gastric bypass but has not seen him recently. He is followed by his primary care and vitamin levels are checked to ensure no deficiency secondary to his bypass, he was found to be B12 deficient and now he takes b12 and multivitamins. No family history of tremors. Tremor is worse some days. May be stress related. Notices it most with reading a paper, he feels the paper shaking. They first noticed ths symptoms a few months ago. No resting tremor. He doesn't notice anything inparticular that makes it better or worse. Hasn't noticed effects of alcohol on the tremor. No other focal neurologic deficits.   Reviewed notes, labs and imaging from outside physicians, which showed:  MRI of the brain (personally reviewed images and agree with the following) Comparison: CT 05/01/2012  Findings: Small area of acute infarction in the right parietal cortex. No associated hemorrhage.  Small area of chronic infarct in the right frontal white matter. No other significant ischemic change. Brainstem and cerebellum are normal. Basal ganglia is intact. Empty sella is noted with a small pituitary gland in the floor of the sella.  The vessels at the base of the brain are patent. Paranasal sinuses  are clear.  IMPRESSION: Small area of acute infarction right parietal cortex.  Review of Systems: Patient complains of symptoms per HPI as well as the following symptoms: Loss of vision, easy bruising, easy bleeding, cough, snoring, rash, weakness, tremor, snoring, racing thoughts  Pertinent negatives per HPI. All others negative.   Social History   Social History  . Marital Status: Married    Spouse Name: N/A  . Number of Children: 1  . Years of Education: N/A   Occupational History  . Realtor    Social History Main Topics  . Smoking status: Never Smoker   . Smokeless tobacco: Former Neurosurgeon    Types: Chew    Quit date: 11/11/2002  . Alcohol Use: Yes     Comment: occasional  . Drug Use: No  . Sexual Activity: Yes   Other Topics Concern  . Not on file   Social History Narrative   Lives with wife.  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 Hypertension Mother   . Hypertension Father   . Heart attack Father 97    Died  . Diabetes Father   . Kidney disease Father     with transplant  . Stroke Paternal Grandmother     Past Medical History  Diagnosis Date  . External thrombosed hemorrhoids   . Osteoarthrosis, unspecified whether generalized or localized, ankle and foot     of the knees,ankle and foot  . Cervicalgia   .  Vitamin B 12 deficiency   . Urticaria, unspecified   . Morbid obesity s/p gastric bypass   . Hernia of unspecified site of abdominal cavity without mention of obstruction or gangrene     of abdominal  . Unspecified intestinal malabsorption   . Pancytopenia   . Hypertension   . Sleep apnea     CPAP compliant  . GERD (gastroesophageal reflux disease)   . Persistent atrial fibrillation (HCC)     a. Pradaxa started 01/2012;  b .s/p TEE/DCCV 02/16/2012; c. recurrent afib->Tikosyn Initiation 03/2012  . Nonischemic cardiomyopathy (HCC)     a. 01/2012 Echo: EF 35-40%, Diff HK, mild  MR;  b. 01/2012 R&L Heart Cath: mildly elevated R heart pressures (pcwp 17) , nl cors.  . Hematuria     a. 01/2012 - to f/u with urology as outpt.  . CVA (cerebral infarction) 05/01/12    on pradaxa at time of stroke, switched to xarelto  . Guillain-Barre syndrome (HCC)     from stroke in 2013  . Guillain-Barre syndrome [facility])     Past Surgical History  Procedure Laterality Date  . Gastric bypass  2004  . Umbilical hernia repair      x 2  . Hernia repair    . Tee without cardioversion  02/16/2012    Procedure: TRANSESOPHAGEAL ECHOCARDIOGRAM (TEE);  Surgeon: [name], MD;  Location: [facility] ENDOSCOPY;  Service: Cardiovascular;  Laterality: N/A;  10a CV  . Cardioversion  02/16/2012    Procedure: CARDIOVERSION;  Surgeon: [name], MD;  Location: [facility] ENDOSCOPY;  Service: Cardiovascular;  Laterality: N/A;  . Cardioversion  04/22/2012    Procedure: CARDIOVERSION;  Surgeon: [name], MD;  Location: [facility] ENDOSCOPY;  Service: Cardiovascular;  Laterality: N/A;  . Tee without cardioversion  06/24/2012    Procedure: TRANSESOPHAGEAL ECHOCARDIOGRAM (TEE);  Surgeon: [name], MD;  Location: [facility] ENDOSCOPY;  Service: Cardiovascular;  Laterality: N/A;  . Atrial fibrillation ablation  06/25/2012    PVI by Dr [name]  . Left and right heart catheterization with coronary angiogram N/A 02/13/2012    Procedure: LEFT AND RIGHT HEART CATHETERIZATION WITH CORONARY ANGIOGRAM;  Surgeon: Tonny Bollman, MD;  Location: [facility] CATH LAB;  Service: Cardiovascular;  Laterality: N/A;  . Atrial fibrillation ablation N/A 06/25/2012    Procedure: ATRIAL FIBRILLATION ABLATION;  Surgeon: Hillis Range, MD;  Location: [facility] CATH LAB;  Service: Cardiovascular;  Laterality: N/A;    Current Outpatient Prescriptions  Medication Sig Dispense Refill  . amLODipine (NORVASC) 5 MG tablet TAKE ONE TABLET BY MOUTH ONCE DAILY 90 tablet 1  . calcium citrate (CALCITRATE - DOSED IN MG ELEMENTAL CALCIUM) 950 MG tablet Take 1 tablet by  mouth daily.    Marland Kitchen losartan (COZAAR) 100 MG tablet Take 0.5 tablets (50 mg total) by mouth 2 (two) times daily. 90 tablet 3  . Methylcellulose, Laxative, (CITRUCEL PO) Take 1 capsule by mouth daily.    . Multiple Vitamin (MULTIVITAMIN) tablet Take 2 tablets in the morning and 1 tablet in the evening    . rivaroxaban (XARELTO) 20 MG TABS tablet Take 1 tablet (20 mg total) by mouth daily with supper. 90 tablet 3  . spironolactone (ALDACTONE) 50 MG tablet TAKE ONE TABLET BY MOUTH ONCE DAILY 90 tablet 1  . vitamin B-12 (CYANOCOBALAMIN) 1000 MCG tablet Take 1,000 mcg by mouth daily.    . vitamin E (VITAMIN E) 400 UNIT capsule Take 400 Units by mouth daily.    . furosemide (LASIX) 40 MG  tablet TAKE ONE TABLET BY MOUTH ONCE DAILY 90 tablet 3   No current facility-administered medications for this visit.    Allergies as of 07/05/2015  . (No Known Allergies)   Physical exam: Exam: Gen: NAD, conversant, well nourised, obese, well groomed                     CV: RRR. +SEM, No Carotid Bruits. No peripheral edema, warm, nontender Eyes: Conjunctivae clear without exudates or hemorrhage  Neuro: Detailed Neurologic Exam  Speech:    Speech is normal; fluent and spontaneous with normal comprehension.  Cognition:    The patient is oriented to person, place, and time;     recent and remote memory intact;     language fluent;     normal attention, concentration,     fund of knowledge Cranial Nerves:    The pupils are equal, round, and reactive to light. The fundi are flat Visual fields are full to finger confrontation. Extraocular movements are intact. Trigeminal sensation is intact and the muscles of mastication are normal. The face is symmetric. The palate elevates in the midline. Hearing intact. Voice is normal. Shoulder shrug is normal. The tongue has normal motion without fasciculations.   Coordination:    Normal finger to nose and heel to shin.   Gait:    Heel-toe and tandem gait are normal.     Motor Observation: Mild posturalhigh frequency and low amplitude  tremor with kinetic components.    No asymmetry, no atrophy.  Tone:    Normal muscle tone.    Posture:    Posture is normal. normal erect    Strength:    Strength is V/V in the upper and lower limbs.      Sensation: intact to LT     Reflex Exam:  DTR's:    Deep tendon reflexes in the upper and lower extremities are normal bilaterally.   Toes:    The toes are downgoing bilaterally.   Clonus:    Clonus is absent.   Assessment/Plan:  49 year old male here for evaluation of tremor. Neurologic exam is again for mild high-frequency low amplitude postural tremor with kinetic components. Differential diagnosis includes enhanced physiologic tremor versus essential tremor. We'll check thyroid and CMP labs today. Patient has been asked to discontinue all caffeine products. He is also instructed to pay attention to the tremor after drinking alcohol.   CC: Dr. Gayla Medicus, MD  [facility] [address] [place], [place] [address]  Phone [phone] Fax [phone]

## 2015-07-05 NOTE — Patient Instructions (Signed)
Physiologic tremor vs Benign Essential Tremor  Remember to drink plenty of fluid, eat healthy meals and do not skip any meals. Try to eat protein with a every meal and eat a healthy snack such as fruit or nuts in between meals. Try to keep a regular sleep-wake schedule and try to exercise daily, particularly in the form of walking, 20-30 minutes a day, if you can.   As far as your medications are concerned, I would like to suggest: Continue current medications  As far as diagnostic testing: Labs  I would like to see you back in 6 months, sooner if we need to. Please call us with any interim questions, concerns, problems, updates or refill requests.   Our phone number is [phone]. We also have an after hours call service for urgent matters and there is a physician on-call for urgent questions. For any emergencies you know to call 911 or go to the nearest emergency room

## 2015-07-06 ENCOUNTER — Telehealth: Payer: Self-pay | Admitting: *Deleted

## 2015-07-06 LAB — COMPREHENSIVE METABOLIC PANEL
ALT: 15 IU/L (ref 0–44)
AST: 18 IU/L (ref 0–40)
Albumin/Globulin Ratio: 1.5 (ref 1.1–2.5)
Albumin: 4 g/dL (ref 3.5–5.5)
Alkaline Phosphatase: 75 IU/L (ref 39–117)
BUN/Creatinine Ratio: 11 (ref 9–20)
BUN: 9 mg/dL (ref 6–24)
Bilirubin Total: 0.4 mg/dL (ref 0.0–1.2)
CO2: 22 mmol/L (ref 18–29)
Calcium: 8.7 mg/dL (ref 8.7–10.2)
Chloride: 104 mmol/L (ref 96–106)
Creatinine, Ser: 0.84 mg/dL (ref 0.76–1.27)
GFR calc Af Amer: 118 mL/min/{1.73_m2} (ref >59)
GFR calc non Af Amer: 102 mL/min/{1.73_m2} (ref >59)
Globulin, Total: 2.7 g/dL (ref 1.5–4.5)
Glucose: 99 mg/dL (ref 65–99)
Potassium: 5.2 mmol/L (ref 3.5–5.2)
Sodium: 141 mmol/L (ref 134–144)
Total Protein: 6.7 g/dL (ref 6.0–8.5)

## 2015-07-06 LAB — THYROID PANEL WITH TSH
Free Thyroxine Index: 1.8 (ref 1.2–4.9)
T3 Uptake Ratio: 27 % (ref 24–39)
T4, Total: 6.8 ug/dL (ref 4.5–12.0)
TSH: 3.03 u[IU]/mL (ref 0.450–4.500)

## 2015-07-06 NOTE — Telephone Encounter (Signed)
LVM that labs normal per Dr Lucia Gaskins. Gave [facility] phone number if he has further questions.

## 2015-07-06 NOTE — Telephone Encounter (Signed)
-----   Message from Anson Fret, MD sent at 07/06/2015 11:15 AM EST ----- Labs all normal thanks

## 2015-10-11 ENCOUNTER — Other Ambulatory Visit: Payer: Self-pay | Admitting: Internal Medicine

## 2015-10-16 ENCOUNTER — Encounter: Payer: Self-pay | Admitting: Nurse Practitioner

## 2016-01-10 ENCOUNTER — Encounter: Payer: Self-pay | Admitting: Neurology

## 2016-01-10 ENCOUNTER — Ambulatory Visit (INDEPENDENT_AMBULATORY_CARE_PROVIDER_SITE_OTHER): Payer: BLUE CROSS/BLUE SHIELD | Admitting: Neurology

## 2016-01-10 VITALS — BP 122/80 | HR 79 | Ht 66.0 in | Wt 249.8 lb

## 2016-01-10 DIAGNOSIS — G250 Essential tremor: Secondary | ICD-10-CM | POA: Diagnosis not present

## 2016-01-10 MED ORDER — PROPRANOLOL HCL 10 MG PO TABS: 10.0000 mg | ORAL_TABLET | Freq: Three times a day (TID) | ORAL | Status: DC

## 2016-01-10 NOTE — Patient Instructions (Signed)
Remember to drink plenty of fluid, eat healthy meals and do not skip any meals. Try to eat protein with a every meal and eat a healthy snack such as fruit or nuts in between meals. Try to keep a regular sleep-wake schedule and try to exercise daily, particularly in the form of walking, 20-30 minutes a day, if you can.   As far as your medications are concerned, I would like to suggest: Start propranolol  2-3x a day. We can always increase to extended release  at night if tolerated.   I would like to see you back in 3-6 months, sooner if we need to. Please call us with any interim questions, concerns, problems, updates or refill requests.   Our phone number is [phone]. We also have an after hours call service for urgent matters and there is a physician on-call for urgent questions. For any emergencies you know to call 911 or go to the nearest emergency room  Essential Tremor A tremor is trembling or shaking that you cannot control. Most tremors affect the hands or arms. Tremors can also affect the head, vocal cords, face, and other parts of the body.  Essential tremor is a tremor without a known cause.  CAUSES Essential tremor has no known cause.  RISK FACTORS You may be at greater risk of essential tremor if:   You have a family member with essential tremor.   You are age 66 or older.   You take certain medicines. SIGNS AND SYMPTOMS The main sign of a tremor is uncontrolled and unintentional rhythmic shaking of a body part.  You may have difficulty eating with a spoon or fork.   You may have difficulty writing.   You may nod your head up and down or side to side.   You may have a quivering voice.  Your tremors:  May get worse over time.   May come and go.   May be more noticeable on one side of your body.   May get worse due to stress, fatigue, caffeine, and extreme heat or cold.  DIAGNOSIS Your health care provider can diagnose essential tremor based  on your symptoms, medical history, and a physical examination. There is no single test to diagnose an essential tremor. However, your health care provider may perform a variety of tests to rule out other conditions. Tests may include:   Blood and urine tests.   Imaging studies of your brain, such as:   CT scan.   MRI.   A test that measures involuntary muscle movement (electromyogram). TREATMENT Your tremors may go away without treatment. Mild tremors may not need treatment if they do not affect your day-to-day life. Severe tremors may need to be treated using one or a combination of the following options:   Medicines. This may include medicine that is injected.  Lifestyle changes.   Physical therapy.  HOME CARE INSTRUCTIONS  Take medicines only as directed by your health care provider.   Limit alcohol intake to no more than 1 drink per day for nonpregnant women and 2 drinks per day for men. One drink equals 12 oz of beer, 5 oz of wine, or 1 oz of hard liquor.  Do not use any tobacco products, including cigarettes, chewing tobacco, or electronic cigarettes. If you need help quitting, ask your health care provider.  Take medicines only as directed by your health care provider.   Avoid extreme heat or cold.   Limit the amount of caffeine you consumeas directed  by your health care provider.   Try to get eight hours of sleep each night.  Find ways to manage your stress, such as meditation or yoga.  Keep all follow-up visits as directed by your health care provider. This is important. This includes any physical therapy visits. SEEK MEDICAL CARE IF:  You experience any changes in the location or intensity of your tremors.   You start having a tremor after starting a new medicine.   You have tremor with other symptoms such as:   Numbness.   Tingling.   Pain.   Weakness.   Your tremor gets worse.   Your tremor interferes with your daily life.     This information is not intended to replace advice given to you by your health care provider. Make sure you discuss any questions you have with your health care provider.   Document Released: 07/31/2014 Document Reviewed: 07/31/2014 Elsevier Interactive Patient Education 2016 Elsevier Inc.  Propranolol tablets What is this medicine? PROPRANOLOL (proe PRAN oh lole) is a beta-blocker. Beta-blockers reduce the workload on the heart and help it to beat more regularly. This medicine is used to treat high blood pressure, to control irregular heart rhythms (arrhythmias) and to relieve chest pain caused by angina. It may also be helpful after a heart attack. This medicine is also used to prevent migraine headaches, relieve uncontrollable shaking (tremors), and help certain problems related to the thyroid gland and adrenal gland. This medicine may be used for other purposes; ask your health care provider or pharmacist if you have questions. What should I tell my health care provider before I take this medicine? They need to know if you have any of these conditions: -circulation problems or blood vessel disease -diabetes -history of heart attack or heart disease, vasospastic angina -kidney disease -liver disease -lung or breathing disease, like asthma or emphysema -pheochromocytoma -slow heart rate -thyroid disease -an unusual or allergic reaction to propranolol, other beta-blockers, medicines, foods, dyes, or preservatives -pregnant or trying to get pregnant -breast-feeding How should I use this medicine? Take this medicine by mouth with a glass of water. Follow the directions on the prescription label. Take your doses at regular intervals. Do not take your medicine more often than directed. Do not stop taking except on your the advice of your doctor or health care professional. Talk to your pediatrician regarding the use of this medicine in children. Special care may be needed. Overdosage: If  you think you have taken too much of this medicine contact a poison control center or emergency room at once. NOTE: This medicine is only for you. Do not share this medicine with others. What if I miss a dose? If you miss a dose, take it as soon as you can. If it is almost time for your next dose, take only that dose. Do not take double or extra doses. What may interact with this medicine? Do not take this medicine with any of the following medications: -feverfew -phenothiazines like chlorpromazine, mesoridazine, prochlorperazine, thioridazine This medicine may also interact with the following medications: -aluminum hydroxide gel -antipyrine -antiviral medicines for HIV or AIDS -barbiturates like phenobarbital -certain medicines for blood pressure, heart disease, irregular heart beat -cimetidine -ciprofloxacin -diazepam -fluconazole -haloperidol -isoniazid -medicines for cholesterol like cholestyramine or colestipol -medicines for mental depression -medicines for migraine headache like almotriptan, eletriptan, frovatriptan, naratriptan, rizatriptan, sumatriptan, zolmitriptan -NSAIDs, medicines for pain and inflammation, like ibuprofen or naproxen -phenytoin -rifampin -teniposide -theophylline -thyroid medicines -tolbutamide -warfarin -zileuton This list may  not describe all possible interactions. Give your health care provider a list of all the medicines, herbs, non-prescription drugs, or dietary supplements you use. Also tell them if you smoke, drink alcohol, or use illegal drugs. Some items may interact with your medicine. What should I watch for while using this medicine? Visit your doctor or health care professional for regular check ups. Check your blood pressure and pulse rate regularly. Ask your health care professional what your blood pressure and pulse rate should be, and when you should contact them. You may get drowsy or dizzy. Do not drive, use machinery, or do anything  that needs mental alertness until you know how this drug affects you. Do not stand or sit up quickly, especially if you are an older patient. This reduces the risk of dizzy or fainting spells. Alcohol can make you more drowsy and dizzy. Avoid alcoholic drinks. This medicine can affect blood sugar levels. If you have diabetes, check with your doctor or health care professional before you change your diet or the dose of your diabetic medicine. Do not treat yourself for coughs, colds, or pain while you are taking this medicine without asking your doctor or health care professional for advice. Some ingredients may increase your blood pressure. What side effects may I notice from receiving this medicine? Side effects that you should report to your doctor or health care professional as soon as possible: -allergic reactions like skin rash, itching or hives, swelling of the face, lips, or tongue -breathing problems -changes in blood sugar -cold hands or feet -difficulty sleeping, nightmares -dry peeling skin -hallucinations -muscle cramps or weakness -slow heart rate -swelling of the legs and ankles -vomiting Side effects that usually do not require medical attention (report to your doctor or health care professional if they continue or are bothersome): -change in sex drive or performance -diarrhea -dry sore eyes -hair loss -nausea -weak or tired This list may not describe all possible side effects. Call your doctor for medical advice about side effects. You may report side effects to FDA at 1-800-FDA-1088. Where should I keep my medicine? Keep out of the reach of children. Store at room temperature between 15 and 30 degrees C (59 and 86 degrees F). Protect from light. Throw away any unused medicine after the expiration date. NOTE: This sheet is a summary. It may not cover all possible information. If you have questions about this medicine, talk to your doctor, pharmacist, or health care  provider.    2016, Elsevier/Gold Standard. (2013-03-14 14:51:53)

## 2016-01-10 NOTE — Progress Notes (Signed)
[id number]    Provider:  Dr Lucia Gaskins Referring Provider: [name], MD Primary Care Physician:  [name], MD  CC: Tremors  Interval history 01/10/2016: TSH was normal. Cutting out caffeine didn;t help. He has gotten used to it. Wife thinks it is worse. He would like treatment. Discussed essential tremor, diagnosis, features, likely progression, and the multiple ways to manage. Decided to try propranolol and he will email me with his progress and we can further titrate as needed, discussed side effects and other medictaions we cna use, deep brain stimulation in severe cases.   HPI: Tyrone [name] is a 50 y.o. male here as a referral from Dr. Sherryll Burger for Tremors. Past medical history of hypertension and atrial fibrillation, gastric bypass,. Her with his wife who also provides information. He noticed it initially when he was handing someone some papers. He has some left-sided weakness after a stroke. He has slight tremor left > right hand mostly while doing something. No associated tremor in the face or legs. He drinks a lot of caffeinated tea, 3-4 cups a day. He had lost over 200 pounds with gastric bypass. He goes to Dr. Clent Ridges in [place] for the gastric bypass but has not seen him recently. He is followed by his primary care and vitamin levels are checked to ensure no deficiency secondary to his bypass, he was found to be B12 deficient and now he takes b12 and multivitamins. No family history of tremors. Tremor is worse some days. May be stress related. Notices it most with reading a paper, he feels the paper shaking. They first noticed ths symptoms a few months ago. No resting tremor. He doesn't notice anything inparticular that makes it better or worse. Hasn't noticed effects of alcohol on the tremor. No other focal neurologic deficits.   Reviewed notes, labs and imaging from outside physicians, which showed:  MRI of the brain (personally reviewed images and agree with the  following) Comparison: CT 05/01/2012  Findings: Small area of acute infarction in the right parietal cortex. No associated hemorrhage.  Small area of chronic infarct in the right frontal white matter. No other significant ischemic change. Brainstem and cerebellum are normal. Basal ganglia is intact. Empty sella is noted with a small pituitary gland in the floor of the sella.  The vessels at the base of the brain are patent. Paranasal sinuses are clear.  IMPRESSION: Small area of acute infarction right parietal cortex.  Review of Systems: Patient complains of symptoms per HPI as well as the following symptoms: Loss of vision, easy bruising, easy bleeding, cough, snoring, rash, weakness, tremor, snoring, racing thoughts Pertinent negatives per HPI. All others negative.   Social History   Social History  . Marital Status: Married    Spouse Name: N/A  . Number of Children: 1  . Years of Education: N/A   Occupational History  . Realtor    Social History Main Topics  . Smoking status: Never Smoker   . Smokeless tobacco: Former Neurosurgeon    Types: Chew    Quit date: 11/11/2002  . Alcohol Use: Yes     Comment: occasional  . Drug Use: No  . Sexual Activity: Yes   Other Topics Concern  . Not on file   Social History Narrative   Lives with wife.  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 Hypertension Mother   . Hypertension Father   .  Heart attack Father 36    Died  . Diabetes Father   . Kidney disease Father     with transplant  . Stroke Paternal Grandmother     Past Medical History  Diagnosis Date  . External thrombosed hemorrhoids   . Osteoarthrosis, unspecified whether generalized or localized, ankle and foot     of the knees,ankle and foot  . Cervicalgia   . Vitamin B 12 deficiency   . Urticaria, unspecified   . Morbid obesity s/p gastric bypass   . Hernia of unspecified site of  abdominal cavity without mention of obstruction or gangrene     of abdominal  . Unspecified intestinal malabsorption   . Pancytopenia   . Hypertension   . Sleep apnea     CPAP compliant  . GERD (gastroesophageal reflux disease)   . Persistent atrial fibrillation (HCC)     a. Pradaxa started 01/2012;  b .s/p TEE/DCCV 02/16/2012; c. recurrent afib->Tikosyn Initiation 03/2012  . Nonischemic cardiomyopathy (HCC)     a. 01/2012 Echo: EF 35-40%, Diff HK, mild MR;  b. 01/2012 R&L Heart Cath: mildly elevated R heart pressures (pcwp 17) , nl cors.  . Hematuria     a. 01/2012 - to f/u with urology as outpt.  . CVA (cerebral infarction) 05/01/12    on pradaxa at time of stroke, switched to xarelto  . Guillain-Barre syndrome (HCC)     from stroke in 2013    Past Surgical History  Procedure Laterality Date  . Gastric bypass  2004  . Umbilical hernia repair      x 2  . Hernia repair    . Tee without cardioversion  02/16/2012    Procedure: TRANSESOPHAGEAL ECHOCARDIOGRAM (TEE);  Surgeon: [name], MD;  Location: [facility] ENDOSCOPY;  Service: Cardiovascular;  Laterality: N/A;  10a CV  . Cardioversion  02/16/2012    Procedure: CARDIOVERSION;  Surgeon: [name], MD;  Location: [facility] ENDOSCOPY;  Service: Cardiovascular;  Laterality: N/A;  . Cardioversion  04/22/2012    Procedure: CARDIOVERSION;  Surgeon: [name], MD;  Location: [facility] ENDOSCOPY;  Service: Cardiovascular;  Laterality: N/A;  . Tee without cardioversion  06/24/2012    Procedure: TRANSESOPHAGEAL ECHOCARDIOGRAM (TEE);  Surgeon: [name], MD;  Location: [facility] ENDOSCOPY;  Service: Cardiovascular;  Laterality: N/A;  . Left and right heart catheterization with coronary angiogram N/A 02/13/2012    Procedure: LEFT AND RIGHT HEART CATHETERIZATION WITH CORONARY ANGIOGRAM;  Surgeon: Tonny Bollman, MD;  Location: [facility] CATH LAB;  Service: Cardiovascular;  Laterality: N/A;  . Atrial fibrillation ablation N/A 06/25/2012    PVI by Dr [name]     Current Outpatient Prescriptions  Medication Sig Dispense Refill  . amLODipine (NORVASC) 5 MG tablet TAKE ONE TABLET BY MOUTH ONCE DAILY 90 tablet 2  . calcium citrate (CALCITRATE - DOSED IN MG ELEMENTAL CALCIUM) 950 MG tablet Take 1 tablet by mouth daily.    . furosemide (LASIX) 40 MG tablet TAKE ONE TABLET BY MOUTH ONCE DAILY 90 tablet 3  . losartan (COZAAR) 100 MG tablet Take 0.5 tablets (50 mg total) by mouth 2 (two) times daily. 90 tablet 3  . Methylcellulose, Laxative, (CITRUCEL PO) Take 1 capsule by mouth daily.    . Multiple Vitamin (MULTIVITAMIN) tablet Take 2 tablets in the morning and 1 tablet in the evening    . rivaroxaban (XARELTO) 20 MG TABS tablet Take 1 tablet (20 mg total) by mouth daily with supper. 90 tablet 3  . spironolactone (ALDACTONE)  50 MG tablet TAKE ONE TABLET BY MOUTH ONCE DAILY 90 tablet 2  . vitamin B-12 (CYANOCOBALAMIN) 1000 MCG tablet Take 1,000 mcg by mouth daily.    . vitamin E (VITAMIN E) 400 UNIT capsule Take 400 Units by mouth daily.     No current facility-administered medications for this visit.    Allergies as of 01/10/2016  . (No Known Allergies)    Vitals: BP 122/80 mmHg  Pulse 79  Ht  (1.676 m)  Wt 249 lb 12.8 oz (113.309 kg)  BMI 40.34 kg/m2  SpO2 96% Last Weight:  Wt Readings from Last 1 Encounters:  01/10/16 249 lb 12.8 oz (113.309 kg)   Last Height:   Ht Readings from Last 1 Encounters:  01/10/16  (1.676 m)    Neuro: Detailed Neurologic Exam  Speech:  Speech is normal; fluent and spontaneous with normal comprehension.  Cognition:  The patient is oriented to person, place, and time;   recent and remote memory intact;   language fluent;   normal attention, concentration,   fund of knowledge Cranial Nerves:  The pupils are equal, round, and reactive to light. The fundi are flat Visual fields are full to finger confrontation. Extraocular movements are intact. Trigeminal sensation is intact  and the muscles of mastication are normal. The face is symmetric. The palate elevates in the midline. Hearing intact. Voice is normal. Shoulder shrug is normal. The tongue has normal motion without fasciculations.   Coordination:  Normal finger to nose and heel to shin.   Gait:  Heel-toe and tandem gait are normal.   Motor Observation: Mild posturalhigh frequency and low amplitude tremor with kinetic components. No asymmetry, no atrophy.  Tone:  Normal muscle tone.   Posture:  Posture is normal. normal erect   Strength:  Strength is V/V in the upper and lower limbs.    Sensation: intact to LT   Reflex Exam:  DTR's:  Deep tendon reflexes in the upper and lower extremities are normal bilaterally.  Toes:  The toes are downgoing bilaterally.  Clonus:  Clonus is absent.   Assessment/Plan: 50 year old male here for evaluation of tremor. Neurologic exam is again for mild high-frequency low amplitude postural tremor with kinetic components. Likely essential tremor.   As far as your medications are concerned, I would like to suggest: Start propranolol  2-3x a day. We can always increase to extended release  at night if tolerated.   I would like to see you back in 3-6 months, sooner if we need to. Please call us with any interim questions, concerns, problems, updates or refill requests.     CC: Dr. Gayla Medicus, MD  [facility] [address] [place], [place] [address]  Phone [phone] Fax [phone]  A total of 30  minutes was spent face-to-face with this patient. Over half this time was spent on counseling patient on the essential tremor diagnosis and different diagnostic and therapeutic options available.

## 2016-02-03 DIAGNOSIS — L718 Other rosacea: Secondary | ICD-10-CM | POA: Diagnosis not present

## 2016-02-03 DIAGNOSIS — Z85828 Personal history of other malignant neoplasm of skin: Secondary | ICD-10-CM | POA: Diagnosis not present

## 2016-02-17 DIAGNOSIS — R319 Hematuria, unspecified: Secondary | ICD-10-CM | POA: Diagnosis not present

## 2016-02-17 DIAGNOSIS — R300 Dysuria: Secondary | ICD-10-CM | POA: Diagnosis not present

## 2016-02-17 DIAGNOSIS — N200 Calculus of kidney: Secondary | ICD-10-CM | POA: Diagnosis not present

## 2016-02-17 DIAGNOSIS — Z299 Encounter for prophylactic measures, unspecified: Secondary | ICD-10-CM | POA: Diagnosis not present

## 2016-02-17 DIAGNOSIS — I10 Essential (primary) hypertension: Secondary | ICD-10-CM | POA: Diagnosis not present

## 2016-02-18 DIAGNOSIS — N390 Urinary tract infection, site not specified: Secondary | ICD-10-CM | POA: Diagnosis not present

## 2016-02-18 DIAGNOSIS — B961 Klebsiella pneumoniae [K. pneumoniae] as the cause of diseases classified elsewhere: Secondary | ICD-10-CM | POA: Diagnosis not present

## 2016-02-19 DIAGNOSIS — N390 Urinary tract infection, site not specified: Secondary | ICD-10-CM | POA: Diagnosis not present

## 2016-02-19 DIAGNOSIS — B961 Klebsiella pneumoniae [K. pneumoniae] as the cause of diseases classified elsewhere: Secondary | ICD-10-CM | POA: Diagnosis not present

## 2016-03-14 DIAGNOSIS — N3021 Other chronic cystitis with hematuria: Secondary | ICD-10-CM | POA: Diagnosis not present

## 2016-03-14 DIAGNOSIS — R310 Gross hematuria: Secondary | ICD-10-CM | POA: Diagnosis not present

## 2016-03-14 DIAGNOSIS — N400 Enlarged prostate without lower urinary tract symptoms: Secondary | ICD-10-CM | POA: Diagnosis not present

## 2016-03-23 DIAGNOSIS — R310 Gross hematuria: Secondary | ICD-10-CM | POA: Diagnosis not present

## 2016-04-09 DIAGNOSIS — R05 Cough: Secondary | ICD-10-CM | POA: Diagnosis not present

## 2016-04-09 DIAGNOSIS — A084 Viral intestinal infection, unspecified: Secondary | ICD-10-CM | POA: Diagnosis not present

## 2016-04-09 DIAGNOSIS — R0602 Shortness of breath: Secondary | ICD-10-CM | POA: Diagnosis not present

## 2016-04-09 DIAGNOSIS — R509 Fever, unspecified: Secondary | ICD-10-CM | POA: Diagnosis not present

## 2016-04-10 ENCOUNTER — Telehealth: Payer: Self-pay | Admitting: Neurology

## 2016-04-10 NOTE — Telephone Encounter (Signed)
Called and spoke to wife. Ok per Dr Lucia Gaskins to fit them in. Made f/u on Wed 9/20 at 10am, check in 945am. Advised them to bring updated copy of insurance card and medication list. She verbalized understanding.

## 2016-04-10 NOTE — Telephone Encounter (Signed)
Patient's wife is calling. She states the patient has had a severe headache since last Friday on the lower left side of his head in the back and would like a soon appointment. Please call and discuss.

## 2016-04-11 NOTE — Telephone Encounter (Signed)
Dr Lucia Gaskins- FYI  Called and spoke to wife. Advised per Dr Lucia Gaskins that she recommends he go to the ER to be evaluated due to nausea and vomiting/nose bleed. Wife stated he was In bathtub this morning. She saw there was blood out of left side of nose. He denied cutting himself shaving. She stated "I know he does not want to jump the gun and take off somewhere". She said he only had one episode of throwing up this morning and the one nose bleed. Advised Dr Lucia Gaskins recommends he go to ED to be evaluated. Highly recommend if he continues to throw up or have nose bleeds. Dr Lucia Gaskins can then see him for f/u tomorrow still. She verbalized understanding. She stated his headache much better this morning than it has been. Advised I will let Dr Lucia Gaskins know.

## 2016-04-11 NOTE — Telephone Encounter (Signed)
Patient's wife is calling back. She states the patient has had 1 nose bleed today and is throwing up but his headache is better. She thinks the patient needs to be seen today instead of tomorrow. Please call and advise.

## 2016-04-12 ENCOUNTER — Ambulatory Visit (INDEPENDENT_AMBULATORY_CARE_PROVIDER_SITE_OTHER): Payer: BLUE CROSS/BLUE SHIELD | Admitting: Family Medicine

## 2016-04-12 ENCOUNTER — Ambulatory Visit (INDEPENDENT_AMBULATORY_CARE_PROVIDER_SITE_OTHER): Payer: BLUE CROSS/BLUE SHIELD | Admitting: Neurology

## 2016-04-12 ENCOUNTER — Encounter: Payer: Self-pay | Admitting: Neurology

## 2016-04-12 ENCOUNTER — Ambulatory Visit (INDEPENDENT_AMBULATORY_CARE_PROVIDER_SITE_OTHER): Payer: BLUE CROSS/BLUE SHIELD

## 2016-04-12 VITALS — BP 116/74 | HR 74 | Temp 98.3°F | Resp 18 | Ht 65.0 in | Wt 239.2 lb

## 2016-04-12 VITALS — BP 111/73 | HR 74 | Ht 66.0 in | Wt 238.6 lb

## 2016-04-12 DIAGNOSIS — R509 Fever, unspecified: Secondary | ICD-10-CM

## 2016-04-12 DIAGNOSIS — R112 Nausea with vomiting, unspecified: Secondary | ICD-10-CM | POA: Diagnosis not present

## 2016-04-12 DIAGNOSIS — G43A0 Cyclical vomiting, not intractable: Secondary | ICD-10-CM | POA: Diagnosis not present

## 2016-04-12 DIAGNOSIS — H539 Unspecified visual disturbance: Secondary | ICD-10-CM

## 2016-04-12 DIAGNOSIS — R51 Headache: Secondary | ICD-10-CM | POA: Diagnosis not present

## 2016-04-12 DIAGNOSIS — R197 Diarrhea, unspecified: Secondary | ICD-10-CM

## 2016-04-12 DIAGNOSIS — R1115 Cyclical vomiting syndrome unrelated to migraine: Secondary | ICD-10-CM

## 2016-04-12 DIAGNOSIS — R519 Headache, unspecified: Secondary | ICD-10-CM

## 2016-04-12 LAB — POCT CBC
Granulocyte percent: 39.3 %G (ref 37–80)
HCT, POC: 31.9 % — AB (ref 43.5–53.7)
Hemoglobin: 10.8 g/dL — AB (ref 14.1–18.1)
Lymph, poc: 2.1 (ref 0.6–3.4)
MCH, POC: 24.5 pg — AB (ref 27–31.2)
MCHC: 33.9 g/dL (ref 31.8–35.4)
MCV: 72.4 fL — AB (ref 80–97)
MID (cbc): 0.5 (ref 0–0.9)
MPV: 6.6 fL (ref 0–99.8)
POC Granulocyte: 1.7 — AB (ref 2–6.9)
POC LYMPH PERCENT: 49.8 %L (ref 10–50)
POC MID %: 10.9 %M (ref 0–12)
Platelet Count, POC: 282 10*3/uL (ref 142–424)
RBC: 4.4 M/uL — AB (ref 4.69–6.13)
RDW, POC: 20.8 %
WBC: 4.3 10*3/uL — AB (ref 4.6–10.2)

## 2016-04-12 LAB — COMPREHENSIVE METABOLIC PANEL
ALT: 8 U/L — ABNORMAL LOW (ref 9–46)
AST: 10 U/L (ref 10–35)
Albumin: 3.4 g/dL — ABNORMAL LOW (ref 3.6–5.1)
Alkaline Phosphatase: 74 U/L (ref 40–115)
BUN: 6 mg/dL — ABNORMAL LOW (ref 7–25)
CO2: 23 mmol/L (ref 20–31)
Calcium: 8.4 mg/dL — ABNORMAL LOW (ref 8.6–10.3)
Chloride: 104 mmol/L (ref 98–110)
Creat: 0.8 mg/dL (ref 0.70–1.33)
Glucose, Bld: 88 mg/dL (ref 65–99)
Potassium: 5.1 mmol/L (ref 3.5–5.3)
Sodium: 136 mmol/L (ref 135–146)
Total Bilirubin: 0.3 mg/dL (ref 0.2–1.2)
Total Protein: 6.3 g/dL (ref 6.1–8.1)

## 2016-04-12 LAB — IFOBT (OCCULT BLOOD): IFOBT: NEGATIVE

## 2016-04-12 LAB — TSH: TSH: 1.7 mIU/L (ref 0.40–4.50)

## 2016-04-12 MED ORDER — HYDROCORTISONE 2.5 % RE CREA: 1.0000 "application " | TOPICAL_CREAM | Freq: Two times a day (BID) | RECTAL | 0 refills | Status: DC

## 2016-04-12 MED ORDER — METRONIDAZOLE 500 MG PO TABS: 500.0000 mg | ORAL_TABLET | Freq: Three times a day (TID) | ORAL | 0 refills | Status: DC

## 2016-04-12 MED ORDER — CIPROFLOXACIN HCL 500 MG PO TABS: 500.0000 mg | ORAL_TABLET | Freq: Two times a day (BID) | ORAL | 0 refills | Status: DC

## 2016-04-12 NOTE — Patient Instructions (Addendum)
I have sent in the Anusol cream for your hemorrhoid.    Go over to [facility] for the MRI.    I think you have gastroenteritis.  We may need to start you on antibiotics for this for the duration of your symptoms, but don't want to do this until we know for sure you don't need a lumbar puncture.  I'll be talking with you later today or tomorrow.    It was good to see you today.     IF you received an x-ray today, you will receive an invoice from [facility]. Please contact [facility] at [phone] with questions or concerns regarding your invoice.   IF you received labwork today, you will receive an invoice from [employer]. Please contact [facility] at [phone] with questions or concerns regarding your invoice.   Our billing staff will not be able to assist you with questions regarding bills from these companies.  You will be contacted with the lab results as soon as they are available. The fastest way to get your results is to activate your My Chart account. Instructions are located on the last page of this paperwork. If you have not heard from [place] regarding the results in 2 weeks, please contact this office.

## 2016-04-12 NOTE — Progress Notes (Signed)
Tyrone [name] is a 50 y.o. male who presents to [facility] today for diarrhea, fevers, melena  1.  Diarrhea:  Present since Friday. Patient states he's had ongoing headache for the past 2 weeks, see below for further details. Patient states that on Friday he had temperature at home to 103. Had episodes of explosive diarrhea that day. He also had persistent nausea that day but no vomiting until Sunday. Diarrhea and fevers continued throughout the weekend. He presented to an outside urgent care on Sunday. He is diagnosed with what sounds to be gastroenteritis and nausea. Provided Phenergan for relief. He took Imodium that day as well as his Phenergan and that help with the nausea and diarrhea. However Monday the diarrhea returned and has persisted. Fever has also persisted. He had an episode of vomiting yesterday and episode of vomiting today. He has not taken any Phenergan for the past several days. He has not taken any Imodium for the past several days either.  There was some concern for possible melena/dark stool. He noticed some minor red streaking when wiping. No blood in the toilet bowl. Denies any actual abdominal pain just states he has nausea and diarrhea. He was seen at his neurologist office earlier today who recommended he come in that today be evaluated  2.  Headaches:  Ongoing issue for the past several weeks. He is being followed by his neurologist for this. He actually has an MRI scheduled later today for further evaluation as his headache suddenly became much worse. He describes pounding pressure behind his eyes. Also pain in the back of his neck. He denies any neck stiffness. No alterations of consciousness. No injuries or trauma to head or neck. No changes in vision or hearing.  He does have history of CVA secondary to atrial fibrillation. He is on Xarelto.  ROS as above.    PMH reviewed. Patient is a nonsmoker.   Past Medical History:  Diagnosis Date  .  Cervicalgia   . CVA (cerebral infarction) 05/01/12   on pradaxa at time of stroke, switched to xarelto  . External thrombosed hemorrhoids   . GERD (gastroesophageal reflux disease)   . Guillain-Barre syndrome (HCC)    from stroke in 2013  . Hematuria    a. 01/2012 - to f/u with urology as outpt.  Marland Kitchen Hernia of unspecified site of abdominal cavity without mention of obstruction or gangrene    of abdominal  . Hypertension   . Morbid obesity s/p gastric bypass   . Nonischemic cardiomyopathy (HCC)    a. 01/2012 Echo: EF 35-40%, Diff HK, mild MR;  b. 01/2012 R&L Heart Cath: mildly elevated R heart pressures (pcwp 17) , nl cors.  . Osteoarthrosis, unspecified whether generalized or localized, ankle and foot    of the knees,ankle and foot  . Pancytopenia   . Persistent atrial fibrillation (HCC)    a. Pradaxa started 01/2012;  b .s/p TEE/DCCV 02/16/2012; c. recurrent afib->Tikosyn Initiation 03/2012  . Sleep apnea    CPAP compliant  . Unspecified intestinal malabsorption   . Urticaria, unspecified   . Vitamin B 12 deficiency    Past Surgical History:  Procedure Laterality Date  . ATRIAL FIBRILLATION ABLATION N/A 06/25/2012   PVI by Dr [name]  . CARDIOVERSION  02/16/2012   Procedure: CARDIOVERSION;  Surgeon: [name], MD;  Location: [facility] ENDOSCOPY;  Service: Cardiovascular;  Laterality: N/A;  . CARDIOVERSION  04/22/2012   Procedure: CARDIOVERSION;  Surgeon: [name],  MD;  Location: [facility] ENDOSCOPY;  Service: Cardiovascular;  Laterality: N/A;  . GASTRIC BYPASS  2004  . HERNIA REPAIR    . LEFT AND RIGHT HEART CATHETERIZATION WITH CORONARY ANGIOGRAM N/A 02/13/2012   Procedure: LEFT AND RIGHT HEART CATHETERIZATION WITH CORONARY ANGIOGRAM;  Surgeon: Tonny Bollman, MD;  Location: [facility] CATH LAB;  Service: Cardiovascular;  Laterality: N/A;  . TEE WITHOUT CARDIOVERSION  02/16/2012   Procedure: TRANSESOPHAGEAL ECHOCARDIOGRAM (TEE);  Surgeon: [name], MD;  Location: [facility] ENDOSCOPY;  Service:  Cardiovascular;  Laterality: N/A;  10a CV  . TEE WITHOUT CARDIOVERSION  06/24/2012   Procedure: TRANSESOPHAGEAL ECHOCARDIOGRAM (TEE);  Surgeon: [name], MD;  Location: [facility] ENDOSCOPY;  Service: Cardiovascular;  Laterality: N/A;  . UMBILICAL HERNIA REPAIR     x 2    Medications reviewed. Current Outpatient Prescriptions  Medication Sig Dispense Refill  . amLODipine (NORVASC) 5 MG tablet TAKE ONE TABLET BY MOUTH ONCE DAILY 90 tablet 2  . calcium citrate (CALCITRATE - DOSED IN MG ELEMENTAL CALCIUM) 950 MG tablet Take 1 tablet by mouth daily.    . furosemide (LASIX) 40 MG tablet TAKE ONE TABLET BY MOUTH ONCE DAILY 90 tablet 3  . losartan (COZAAR) 100 MG tablet Take 0.5 tablets (50 mg total) by mouth 2 (two) times daily. 90 tablet 3  . Methylcellulose, Laxative, (CITRUCEL PO) Take 1 capsule by mouth daily.    . Multiple Vitamin (MULTIVITAMIN) tablet Take 2 tablets in the morning and 1 tablet in the evening    . rivaroxaban (XARELTO) 20 MG TABS tablet Take 1 tablet (20 mg total) by mouth daily with supper. 90 tablet 3  . spironolactone (ALDACTONE) 50 MG tablet TAKE ONE TABLET BY MOUTH ONCE DAILY 90 tablet 2  . vitamin B-12 (CYANOCOBALAMIN) 1000 MCG tablet Take 1,000 mcg by mouth daily.    . vitamin E (VITAMIN E) 400 UNIT capsule Take 400 Units by mouth daily.    . propranolol (INDERAL) 10 MG tablet Take 1 tablet (10 mg total) by mouth 3 (three) times daily. (Patient not taking: Reported on 04/12/2016) 90 tablet 11   No current facility-administered medications for this visit.      Physical Exam:  BP 116/74 (BP Location: Right Arm, Patient Position: Sitting, Cuff Size: Large)   Pulse 74   Temp 98.3 F (36.8 C) (Oral)   Resp 18   Ht 5\' 5"  (1.651 m)   Wt 239 lb 3.2 oz (108.5 kg)   SpO2 97%   BMI 39.80 kg/m  Gen:  Alert, cooperative patient who appears stated age in no acute distress.  Vital signs reviewed. HEENT: EOMI,  MMM.  Funduscopy within normal limits bilaterally. Pulm:  Clear  to auscultation bilaterally with good air movement.  No wheezes or rales noted.   Cardiac:  Regular rate and rhythm without murmur auscultated.  Good S1/S2. Abd:  Soft/nondistended/nontender.  Good bowel sounds throughout all four quadrants.  No masses noted.  Exts: Non edematous BL  LE, warm and well perfused.  Rectal exam:  Some mild surrounding erythema of rectum. Nonthrombosed external hemorrhoid noted about 4:00 position. Good rectal tone. No stool in vault. No internal hemorrhoids palpated. Prostate normal. Neuro: Alert and oriented 4. Cranial nerve 2-12 intact. No focal neurological deficits. Strength and sensation are 5 out of 5 throughout.  Results for orders placed or performed in visit on 04/12/16  POCT CBC  Result Value Ref Range   WBC 4.3 (A) 4.6 - 10.2 K/uL   Lymph, poc 2.1  0.6 - 3.4   POC LYMPH PERCENT 49.8 10 - 50 %L   MID (cbc) 0.5 0 - 0.9   POC MID % 10.9 0 - 12 %M   POC Granulocyte 1.7 (A) 2 - 6.9   Granulocyte percent 39.3 37 - 80 %G   RBC 4.40 (A) 4.69 - 6.13 M/uL   Hemoglobin 10.8 (A) 14.1 - 18.1 g/dL   HCT, POC 69.631.9 (A) 29.543.5 - 53.7 %   MCV 72.4 (A) 80 - 97 fL   MCH, POC 24.5 (A) 27 - 31.2 pg   MCHC 33.9 31.8 - 35.4 g/dL   RDW, POC 28.420.8 %   Platelet Count, POC 282 142 - 424 K/uL   MPV 6.6 0 - 99.8 fL  IFOBT POC (occult bld, rslt in office)  Result Value Ref Range   IFOBT Negative     Assessment and Plan:  1.  Fever and diarrhea:  - Gastroenteritis most likely etiology -- fever noted.  - Has recently been treated with antibiotics about 3 weeks ago. This was Bactrim. His diarrhea did not start until Friday about 2 weeks after he finishes antibiotics. I therefore think that C. difficile is a little less unlikely. -Treat with Imodium for relief. -Don't have a clear diagnosis at this point in time. Plan to treat with Cipro and Flagyl if he does not warrant need for lumbar puncture. -Negative Hemoccult here in clinic was a little surprised about. No melena on  glove. - Checking CMET/CBC/TSH today.  Will call with results.   #2. Headache: -He is being treated and further evaluated for this by his neurologist. He has an MRI coming up and less than hour. -He is not exhibiting any neurological deficits that I can tell today. - Meningitis a possibility Fortune a lumbar puncture. However I do not think this is very likely at this time.  **Update:  Contacted by his neurologist later this PM who states that his MRI did not show any acute findings. No need for lumbar puncture. We'll start antibiotics.

## 2016-04-12 NOTE — Progress Notes (Signed)
[facility]    Provider:  Dr Lucia Gaskins Referring Provider: [name], MD Primary Care Physician:  [name], MD  CC: Tremors  Interval History 04/12/2016: Patient here for a new issue today new onset headache. They went to urgent care on Sunday and we encouraged him to go to the emergency room yesterday and they did not. He needs to call his primary care for his diarrhea and vomiting. 2 weeks ago started dull headache on the back of the head radiating to the front of the head. The pain starts in the left occipital area and radiates to the front. There is a know where the pain starts. headches have been worsening, they are severe, he has been vomiting. Over the weekend he kept ice on his head. He could not get out of the bed all say Sunday. They went to urgent care Sunday and he was given hydrocodone. Stools was initially red streaked and now is charcoal/black. He is having severe diarrhea. He goes 15 times a day. He is dizzy. He is lightheaded. He is fatigued. He has had severe headache. The headache is continuous. It is a dull sever ache. No focal neurologic deficits. Sunday temperature was 103.6.   Interval history 01/10/2016: TSH was normal. Cutting out caffeine didn;t help. He has gotten used to it. Wife thinks it is worse. He would like treatment. Discussed essential tremor, diagnosis, features, likely progression, and the multiple ways to manage. Decided to try propranolol and he will email me with his progress and we can further titrate as needed, discussed side effects and other medictaions we cna use, deep brain stimulation in severe cases.   HPI: Tyrone [name] is a 50 y.o. male here as a referral from Dr. Sherryll Burger for Tremors. Past medical history of hypertension and atrial fibrillation, gastric bypass,. Her with his wife who also provides information. He noticed it initially when he was handing someone some papers. He has some left-sided weakness after a stroke. He has  slight tremor left > right hand mostly while doing something. No associated tremor in the face or legs. He drinks a lot of caffeinated tea, 3-4 cups a day. He had lost over 200 pounds with gastric bypass. He goes to Dr. Clent Ridges in [place] for the gastric bypass but has not seen him recently. He is followed by his primary care and vitamin levels are checked to ensure no deficiency secondary to his bypass, he was found to be B12 deficient and now he takes b12 and multivitamins. No family history of tremors. Tremor is worse some days. May be stress related. Notices it most with reading a paper, he feels the paper shaking. They first noticed ths symptoms a few months ago. No resting tremor. He doesn't notice anything inparticular that makes it better or worse. Hasn't noticed effects of alcohol on the tremor. No other focal neurologic deficits.   Reviewed notes, labs and imaging from outside physicians, which showed:  MRI of the brain (personally reviewed images and agree with the following) Comparison: CT 05/01/2012  Findings: Small area of acute infarction in the right parietal cortex. No associated hemorrhage.  Small area of chronic infarct in the right frontal white matter. No other significant ischemic change. Brainstem and cerebellum are normal. Basal ganglia is intact. Empty sella is noted with a small pituitary gland in the floor of the sella.  The vessels at the base of the brain are patent. Paranasal sinuses are clear.  IMPRESSION: Small area of acute infarction right  parietal cortex.  Review of Systems: Patient complains of symptoms per HPI as well as the following symptoms: Loss of vision, easy bruising, easy bleeding, cough, snoring, rash, weakness, tremor, snoring, racing thoughts Pertinent negatives per HPI. All others negative.   Social History   Social History  . Marital status: Married    Spouse name: N/A  . Number of children: 1  . Years of education: N/A    Occupational History  . Realtor    Social History Main Topics  . Smoking status: Never Smoker  . Smokeless tobacco: Former Neurosurgeon    Types: Chew    Quit date: 11/11/2002  . Alcohol use Yes     Comment: occasional  . Drug use: No  . Sexual activity: Yes   Other Topics Concern  . Not on file   Social History Narrative   Lives with wife.  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 Hypertension Mother   . Hypertension Father   . Heart attack Father 7    Died  . Diabetes Father   . Kidney disease Father     with transplant  . Stroke Paternal Grandmother     Past Medical History:  Diagnosis Date  . Cervicalgia   . CVA (cerebral infarction) 05/01/12   on pradaxa at time of stroke, switched to xarelto  . External thrombosed hemorrhoids   . GERD (gastroesophageal reflux disease)   . Guillain-Barre syndrome (HCC)    from stroke in 2013  . Hematuria    a. 01/2012 - to f/u with urology as outpt.  Marland Kitchen Hernia of unspecified site of abdominal cavity without mention of obstruction or gangrene    of abdominal  . Hypertension   . Morbid obesity s/p gastric bypass   . Nonischemic cardiomyopathy (HCC)    a. 01/2012 Echo: EF 35-40%, Diff HK, mild MR;  b. 01/2012 R&L Heart Cath: mildly elevated R heart pressures (pcwp 17) , nl cors.  . Osteoarthrosis, unspecified whether generalized or localized, ankle and foot    of the knees,ankle and foot  . Pancytopenia   . Persistent atrial fibrillation (HCC)    a. Pradaxa started 01/2012;  b .s/p TEE/DCCV 02/16/2012; c. recurrent afib->Tikosyn Initiation 03/2012  . Sleep apnea    CPAP compliant  . Unspecified intestinal malabsorption   . Urticaria, unspecified   . Vitamin B 12 deficiency     Past Surgical History:  Procedure Laterality Date  . ATRIAL FIBRILLATION ABLATION N/A 06/25/2012   PVI by Dr [name]  . CARDIOVERSION  02/16/2012   Procedure: CARDIOVERSION;  Surgeon:  [name], MD;  Location: [facility] ENDOSCOPY;  Service: Cardiovascular;  Laterality: N/A;  . CARDIOVERSION  04/22/2012   Procedure: CARDIOVERSION;  Surgeon: [name], MD;  Location: [facility] ENDOSCOPY;  Service: Cardiovascular;  Laterality: N/A;  . GASTRIC BYPASS  2004  . HERNIA REPAIR    . LEFT AND RIGHT HEART CATHETERIZATION WITH CORONARY ANGIOGRAM N/A 02/13/2012   Procedure: LEFT AND RIGHT HEART CATHETERIZATION WITH CORONARY ANGIOGRAM;  Surgeon: Tonny Bollman, MD;  Location: [facility] CATH LAB;  Service: Cardiovascular;  Laterality: N/A;  . TEE WITHOUT CARDIOVERSION  02/16/2012   Procedure: TRANSESOPHAGEAL ECHOCARDIOGRAM (TEE);  Surgeon: [name], MD;  Location: [facility] ENDOSCOPY;  Service: Cardiovascular;  Laterality: N/A;  10a CV  . TEE WITHOUT CARDIOVERSION  06/24/2012   Procedure: TRANSESOPHAGEAL ECHOCARDIOGRAM (TEE);  Surgeon: [name], MD;  Location: [facility] ENDOSCOPY;  Service: Cardiovascular;  Laterality: N/A;  . UMBILICAL HERNIA REPAIR     x 2    Current Outpatient Prescriptions  Medication Sig Dispense Refill  . amLODipine (NORVASC) 5 MG tablet TAKE ONE TABLET BY MOUTH ONCE DAILY 90 tablet 2  . calcium citrate (CALCITRATE - DOSED IN MG ELEMENTAL CALCIUM) 950 MG tablet Take 1 tablet by mouth daily.    . furosemide (LASIX) 40 MG tablet TAKE ONE TABLET BY MOUTH ONCE DAILY 90 tablet 3  . losartan (COZAAR) 100 MG tablet Take 0.5 tablets (50 mg total) by mouth 2 (two) times daily. 90 tablet 3  . Methylcellulose, Laxative, (CITRUCEL PO) Take 1 capsule by mouth daily.    . Multiple Vitamin (MULTIVITAMIN) tablet Take 2 tablets in the morning and 1 tablet in the evening    . rivaroxaban (XARELTO) 20 MG TABS tablet Take 1 tablet (20 mg total) by mouth daily with supper. 90 tablet 3  . spironolactone (ALDACTONE) 50 MG tablet TAKE ONE TABLET BY MOUTH ONCE DAILY 90 tablet 2  . vitamin B-12 (CYANOCOBALAMIN) 1000 MCG tablet Take 1,000 mcg by mouth daily.    . vitamin E (VITAMIN E) 400 UNIT capsule  Take 400 Units by mouth daily.    . propranolol (INDERAL) 10 MG tablet Take 1 tablet (10 mg total) by mouth 3 (three) times daily. (Patient not taking: Reported on 04/12/2016) 90 tablet 11   No current facility-administered medications for this visit.     Allergies as of 04/12/2016  . (No Known Allergies)    Vitals: BP 111/73 (BP Location: Right Arm, Patient Position: Sitting, Cuff Size: Large)   Pulse 74   Ht 5\' 6"  (1.676 m)   Wt 238 lb 9.6 oz (108.2 kg)   BMI 38.51 kg/m  Last Weight:  Wt Readings from Last 1 Encounters:  04/12/16 238 lb 9.6 oz (108.2 kg)   Last Height:   Ht Readings from Last 1 Encounters:  04/12/16 5\' 6"  (1.676 m)    Neuro: Detailed Neurologic Exam  Speech:  Speech is normal; fluent and spontaneous with normal comprehension.  Cognition:  The patient is oriented to person, place, and time;   recent and remote memory intact;   language fluent;   normal attention, concentration,   fund of knowledge Cranial Nerves:  The pupils are equal, round, and reactive to light. The fundi are flat Visual fields are full to finger confrontation. Extraocular movements are intact. Trigeminal sensation is intact and the muscles of mastication are normal. The face is symmetric. The palate elevates in the midline. Hearing intact. Voice is normal. Shoulder shrug is normal. The tongue has normal motion without fasciculations.   Coordination:  Normal finger to nose and heel to shin.   Gait:  Heel-toe and tandem gait are normal.   Motor Observation: Mild posturalhigh frequency and low amplitude tremor with kinetic components. No asymmetry, no atrophy.  Tone:  Normal muscle tone.   Posture:  Posture is normal. normal erect   Strength:  Strength is V/V in the upper and lower limbs.    Sensation: intact to LT   Reflex Exam:  DTR's:  Deep tendon reflexes in the upper and lower extremities are normal bilaterally.   Toes:  The toes are downgoing bilaterally.  Clonus:  Clonus is absent.   Assessment/Plan: 50 year old male here for evaluation of tremor. Neurologic exam is again for mild high-frequency low amplitude postural tremor with kinetic components. Likely essential tremor.   Acute onset severe  refractory headache with vision changes 2 weeks: mri of the brain w/wo contrast stat today at 4pm Urgent care [address] 3:25 pm for diarrhea, black stools, fever May need an LP if workup unremarkable I advised him to consider going to the emergency room they decline we will do what we can. His BP is fine today, he is ambulatory and oriented so we called urgent care and appreciate Angie and their wonderful staff who are able to see him at 1:15.  As far as your medications are concerned, I would like to suggest for essential tremor: propranolol 10mg  2-3x a day. We can always increase to extended release 60mg  at night if tolerated.     CC: Dr. Gayla Medicus, MD  [facility] [address] [place], [place] [address]  Phone [phone] Fax [phone] Naomie Dean, MD  [facility] [address] [place], [place] [address]  Phone [phone] Fax [phone]  A total of 45 minutes was spent face-to-face with this patient. Over half this time was spent on counseling patient on the severe headache, nausea, vomiting diagnosis and different diagnostic and therapeutic options available.

## 2016-04-13 MED ORDER — GADOPENTETATE DIMEGLUMINE 469.01 MG/ML IV SOLN: 20.0000 mL | Freq: Once | INTRAVENOUS | Status: AC | PRN

## 2016-05-04 DIAGNOSIS — R310 Gross hematuria: Secondary | ICD-10-CM | POA: Diagnosis not present

## 2016-05-04 DIAGNOSIS — N400 Enlarged prostate without lower urinary tract symptoms: Secondary | ICD-10-CM | POA: Diagnosis not present

## 2016-05-29 ENCOUNTER — Encounter: Payer: Self-pay | Admitting: Neurology

## 2016-06-05 ENCOUNTER — Ambulatory Visit (INDEPENDENT_AMBULATORY_CARE_PROVIDER_SITE_OTHER): Payer: BLUE CROSS/BLUE SHIELD | Admitting: Physician Assistant

## 2016-06-05 VITALS — BP 130/80 | HR 69 | Temp 98.7°F | Resp 16 | Ht 65.0 in | Wt 239.0 lb

## 2016-06-05 DIAGNOSIS — G4733 Obstructive sleep apnea (adult) (pediatric): Secondary | ICD-10-CM | POA: Diagnosis not present

## 2016-06-05 DIAGNOSIS — R040 Epistaxis: Secondary | ICD-10-CM

## 2016-06-05 DIAGNOSIS — Z7689 Persons encountering health services in other specified circumstances: Secondary | ICD-10-CM | POA: Diagnosis not present

## 2016-06-05 DIAGNOSIS — D649 Anemia, unspecified: Secondary | ICD-10-CM

## 2016-06-05 DIAGNOSIS — Z9989 Dependence on other enabling machines and devices: Secondary | ICD-10-CM | POA: Diagnosis not present

## 2016-06-05 LAB — POCT CBC
Granulocyte percent: 57.3 %G (ref 37–80)
HCT, POC: 28.4 % — AB (ref 43.5–53.7)
Hemoglobin: 9.6 g/dL — AB (ref 14.1–18.1)
Lymph, poc: 1.9 (ref 0.6–3.4)
MCH, POC: 24.9 pg — AB (ref 27–31.2)
MCHC: 33.9 g/dL (ref 31.8–35.4)
MCV: 73.4 fL — AB (ref 80–97)
MID (cbc): 0.4 (ref 0–0.9)
MPV: 6.9 fL (ref 0–99.8)
POC Granulocyte: 3.1 (ref 2–6.9)
POC LYMPH PERCENT: 35.5 %L (ref 10–50)
POC MID %: 7.2 %M (ref 0–12)
Platelet Count, POC: 213 10*3/uL (ref 142–424)
RBC: 3.87 M/uL — AB (ref 4.69–6.13)
RDW, POC: 18.2 %
WBC: 5.4 10*3/uL (ref 4.6–10.2)

## 2016-06-05 NOTE — Progress Notes (Signed)
Patient ID: Tyrone [name], male    DOB: 1965/10/24, 50 y.o.   MRN: [id number]  PCP: Tyrone Peri, MD, looking for someone new, in this area. Dr. Lucia Gaskins recommended me.  Chief Complaint  Patient presents with  . Nose Problem    x 5-6 days bleeding    Subjective:    HPI Presents for evaluation of nose bleeds. He is accompanied by his wife, Tyrone [name].  Stroke in 04/2012. Takes Xarelto. Never had nose bleeds until 03/2016, when he experienced a GI illness. Always the LEFT nostril. Never the RIGHT. Has had 4 bleeds in the past 5 days. Begin suddenly, and gush. With pressure, it takes 4-5 minutes to stop. No associated HA. Reports dry cough since prior to his last visit here. Voice is more raspy over the past 2-3 weeks. Some runny nose on a cold day if he's outside, otherwise no congestion, drainage.  Uses CPAP for OSA and has warmed humidified air. Needs new equipment but has been advised that he needs an updated sleep study/titration before he can get more.  Chart reviewed. He has been anemic since at least 08/27/2013 (as far back as I have results in the EMR). Denies any evaluation for that. Has not yet had a colonoscopy.     Review of Systems As above. No CP, SOB, HA, dizziness. No sore throat. No ear fullness or pain. No sneezing. No fatigue.    Patient Active Problem List   Diagnosis Date Noted  . OSA on CPAP 06/05/2016  . Tremor 07/05/2015  . Cerebral embolism with cerebral infarction (HCC) 05/01/2012  . Reflux esophagitis   . Chronic combined systolic and diastolic heart failure (HCC) 04/07/2012  . Hypokalemia 04/06/2012  . Sinoatrial node dysfunction/bradycardia   . Nonischemic cardiomyopathy (HCC)   . Atrial fibrillation (HCC)   . Hypertension 11/16/2011  . Obesity 11/16/2011     Prior to Admission medications   Medication Sig Start Date End Date Taking? Authorizing Provider  amLODipine (NORVASC) 5 MG tablet TAKE ONE TABLET BY MOUTH ONCE DAILY 10/11/15  Yes  Hillis Range, MD  calcium citrate (CALCITRATE - DOSED IN MG ELEMENTAL CALCIUM) 950 MG tablet Take 1 tablet by mouth daily.   Yes Historical Provider, MD  hydrocortisone (ANUSOL-HC) 2.5 % rectal cream Place 1 application rectally 2 (two) times daily. X 5 days 04/12/16  Yes Tobey Grim, MD  losartan (COZAAR) 100 MG tablet Take 0.5 tablets (50 mg total) by mouth 2 (two) times daily. 04/29/15  Yes Hillis Range, MD  Methylcellulose, Laxative, (CITRUCEL PO) Take 1 capsule by mouth daily.   Yes Historical Provider, MD  Multiple Vitamin (MULTIVITAMIN) tablet Take 2 tablets in the morning and 1 tablet in the evening   Yes Historical Provider, MD  rivaroxaban (XARELTO) 20 MG TABS tablet Take 1 tablet (20 mg total) by mouth daily with supper. 06/14/15  Yes Hillis Range, MD  spironolactone (ALDACTONE) 50 MG tablet TAKE ONE TABLET BY MOUTH ONCE DAILY 10/11/15  Yes Hillis Range, MD  vitamin B-12 (CYANOCOBALAMIN) 1000 MCG tablet Take 1,000 mcg by mouth daily.   Yes Historical Provider, MD  vitamin E (VITAMIN E) 400 UNIT capsule Take 400 Units by mouth daily.   Yes Historical Provider, MD     No Known Allergies     Objective:  Physical Exam  Constitutional: He is oriented to person, place, and time. He appears well-developed and well-nourished. He is active and cooperative. No distress.  BP 130/80 (BP Location: Right Arm, Patient Position: Sitting,  Cuff Size: Normal)   Pulse 69   Temp 98.7 F (37.1 C) (Oral)   Resp 16   Ht 5\' 5"  (1.651 m)   Wt 239 lb (108.4 kg)   SpO2 98%   BMI 39.77 kg/m   HENT:  Head: Normocephalic and atraumatic.  Right Ear: Hearing, tympanic membrane, external ear and ear canal normal.  Left Ear: Hearing, tympanic membrane, external ear and ear canal normal.  Nose: Mucosal edema present. No rhinorrhea, nose lacerations or sinus tenderness. Epistaxis is observed.  No foreign bodies.  Mouth/Throat: Uvula is midline, oropharynx is clear and moist and mucous membranes are normal.  No uvula swelling.  Nasal exam was uncomfortable to patient. Just as I was examining the nasal septum, his head jerked slightly and the speculum touched the septum and the nare began to bleed again. Resolved with holding pressure.  Eyes: Conjunctivae are normal. No scleral icterus.  Neck: Normal range of motion. Neck supple. No thyromegaly present.  Cardiovascular: Normal rate, regular rhythm and normal heart sounds.   Pulses:      Radial pulses are 2+ on the right side, and 2+ on the left side.  Pulmonary/Chest: Effort normal and breath sounds normal.  Lymphadenopathy:       Head (right side): No tonsillar, no preauricular, no posterior auricular and no occipital adenopathy present.       Head (left side): No tonsillar, no preauricular, no posterior auricular and no occipital adenopathy present.    He has no cervical adenopathy.       Right: No supraclavicular adenopathy present.       Left: No supraclavicular adenopathy present.  Neurological: He is alert and oriented to person, place, and time. No sensory deficit.  Skin: Skin is warm, dry and intact. No rash noted. No cyanosis or erythema. Nails show no clubbing.  Psychiatric: He has a normal mood and affect. His speech is normal and behavior is normal.       Results for orders placed or performed in visit on 06/05/16  POCT CBC  Result Value Ref Range   WBC 5.4 4.6 - 10.2 K/uL   Lymph, poc 1.9 0.6 - 3.4   POC LYMPH PERCENT 35.5 10 - 50 %L   MID (cbc) 0.4 0 - 0.9   POC MID % 7.2 0 - 12 %M   POC Granulocyte 3.1 2 - 6.9   Granulocyte percent 57.3 37 - 80 %G   RBC 3.87 (A) 4.69 - 6.13 M/uL   Hemoglobin 9.6 (A) 14.1 - 18.1 g/dL   HCT, POC 28.3 (A) 66.2 - 53.7 %   MCV 73.4 (A) 80 - 97 fL   MCH, POC 24.9 (A) 27 - 31.2 pg   MCHC 33.9 31.8 - 35.4 g/dL   RDW, POC 94.7 %   Platelet Count, POC 213 142 - 424 K/uL   MPV 6.9 0 - 99.8 fL       Assessment & Plan:   1. Epistaxis I cannot identify the exact location of the bleeding.  Refer to ENT, as he may need cauterization. In the meantime, he'll use saline nasal spray and Vaseline to moisturize the nose, avoid hard blowing of his nose, etc.  - Ambulatory referral to ENT  2. Anemia, unspecified type Not new, but worsening. Await remaining labs. Will start iron supplementation if needed. Refer to GI. - POCT CBC - Iron, TIBC and Ferritin Panel - Ambulatory referral to Gastroenterology  3. OSA on CPAP He'll talk with Dr. Lucia Gaskins about  updating his sleep study/titration study. He needs to make sure the air is humidified, especially given the epistaxis.  4. Encounter to establish care He'll sign release to obtain his records from previous PCP. [facility] is due in March.   Fernande Brashelle S. Lynette Topete, PA-C Physician Assistant-Certified [facility]

## 2016-06-05 NOTE — Patient Instructions (Addendum)
Talk with Dr. Lucia Gaskins about a repeat sleep study and new CPAP set up, with humidified air.  Use saline nasal spray and Vaseline in the nose to help prevent bleeding. For now, avoid hard blowing of your nose.  When your nose does bleed, hold pressure for 15-20 minutes (without peeking!). If it's still bleeding, hold for another 20 minutes. If it continues after that, go to the emergency department.  While the nose bleeding is your reason for being here today, the anemia is more concerning to me at the moment. It's important that we get that addressed soon.     IF you received an x-ray today, you will receive an invoice from [facility]. Please contact [facility] at [phone] with questions or concerns regarding your invoice.   IF you received labwork today, you will receive an invoice from [employer]. Please contact [facility] at [phone] with questions or concerns regarding your invoice.   Our billing staff will not be able to assist you with questions regarding bills from these companies.  You will be contacted with the lab results as soon as they are available. The fastest way to get your results is to activate your My Chart account. Instructions are located on the last page of this paperwork. If you have not heard from [place] regarding the results in 2 weeks, please contact this office.

## 2016-06-08 DIAGNOSIS — R1013 Epigastric pain: Secondary | ICD-10-CM | POA: Diagnosis not present

## 2016-06-08 DIAGNOSIS — R079 Chest pain, unspecified: Secondary | ICD-10-CM | POA: Diagnosis not present

## 2016-06-08 DIAGNOSIS — Z1211 Encounter for screening for malignant neoplasm of colon: Secondary | ICD-10-CM | POA: Diagnosis not present

## 2016-06-13 ENCOUNTER — Telehealth: Payer: Self-pay | Admitting: Pharmacist

## 2016-06-13 NOTE — Telephone Encounter (Signed)
Received fax from Dr Kenna Gilbert office that pt is scheduled to have a colonoscopy on 06/26/16. He takes Xarelto for afib with CHADS2 score of 4 (HTN, HF, and stroke in 2013 after cardioversion while taking Pradaxa - thought to be due to GI absorption issues with pt's hx of bariatric surgery). Recommend pt only hold Xarelto for 24 hours and resume ASAP after procedure. Clearance faxed to [phone].

## 2016-06-23 ENCOUNTER — Other Ambulatory Visit: Payer: Self-pay | Admitting: Gastroenterology

## 2016-07-04 DIAGNOSIS — I10 Essential (primary) hypertension: Secondary | ICD-10-CM | POA: Diagnosis not present

## 2016-07-04 DIAGNOSIS — R040 Epistaxis: Secondary | ICD-10-CM | POA: Insufficient documentation

## 2016-07-04 DIAGNOSIS — Z8673 Personal history of transient ischemic attack (TIA), and cerebral infarction without residual deficits: Secondary | ICD-10-CM | POA: Diagnosis not present

## 2016-07-04 DIAGNOSIS — Z7901 Long term (current) use of anticoagulants: Secondary | ICD-10-CM | POA: Diagnosis not present

## 2016-07-06 NOTE — H&P (Signed)
Tyrone FlightJohn L [name] HPI: This [age] year white old male presents to the office for colorectal cancer screening. He averages has 3-4 BM's per day with no obvious blood or mucus in the stool. He has good appetite and his weight has been stable. He has hemorrhoids that flare up occcasionally and is has a prescription for Hydrocortisone cream. He had been hoarse recently with epigastric pain, chest pain and retrosternal burning, which is worse at night. He denies having any complaints of nausea, vomiting, acid reflux, dysphagia or odynophagia. He had labs done on 06/05/2016 which revealed a hemoglobin of 9.6 gm/dl. He denies having a family history of colon cancer, celiac sprue or IBD. He has been having a lot of nosebleeds recently. He is waiting for a referral to an ENT for futher evaluation of his nosebleeds.  Past Medical History:  Diagnosis Date  . Cervicalgia   . CVA (cerebral infarction) 05/01/12   on pradaxa at time of stroke, switched to xarelto  . External thrombosed hemorrhoids   . GERD (gastroesophageal reflux disease)   . Guillain Barr syndrome [facility](HCC)    following seasonal influenza vaccination  . Guillain-Barre syndrome (HCC)    from stroke in 2013  . Hematuria    a. 01/2012 - to f/u with urology as outpt.  Marland Kitchen. Hernia of unspecified site of abdominal cavity without mention of obstruction or gangrene    of abdominal  . Hypertension   . Morbid obesity s/p gastric bypass   . Nonischemic cardiomyopathy (HCC)    a. 01/2012 Echo: EF 35-40%, Diff HK, mild MR;  b. 01/2012 R&L Heart Cath: mildly elevated R heart pressures (pcwp 17) , nl cors.  . Osteoarthrosis, unspecified whether generalized or localized, ankle and foot    of the knees,ankle and foot  . Pancytopenia   . Persistent atrial fibrillation (HCC)    a. Pradaxa started 01/2012;  b .s/p TEE/DCCV 02/16/2012; c. recurrent afib->Tikosyn Initiation 03/2012  . Sleep apnea    CPAP compliant  . Unspecified intestinal malabsorption   . Urticaria,  unspecified   . Vitamin B 12 deficiency     Past Surgical History:  Procedure Laterality Date  . ATRIAL FIBRILLATION ABLATION N/A 06/25/2012   PVI by Dr [name]  . CARDIOVERSION  02/16/2012   Procedure: CARDIOVERSION;  Surgeon: Lewayne BuntingBrian S Crenshaw, MD;  [facility];  Service: Cardiovascular;  Laterality: N/A;  . CARDIOVERSION  04/22/2012   Procedure: CARDIOVERSION;  Surgeon: Duke SalviaSteven C Klein, MD;  [facility];  Service: Cardiovascular;  Laterality: N/A;  . GASTRIC BYPASS  2004  . HERNIA REPAIR    . LEFT AND RIGHT HEART CATHETERIZATION WITH CORONARY ANGIOGRAM N/A 02/13/2012   Procedure: LEFT AND RIGHT HEART CATHETERIZATION WITH CORONARY ANGIOGRAM;  Surgeon: Tonny BollmanMichael Cooper, MD;  [facility];  Service: Cardiovascular;  Laterality: N/A;  . TEE WITHOUT CARDIOVERSION  02/16/2012   Procedure: TRANSESOPHAGEAL ECHOCARDIOGRAM (TEE);  Surgeon: Lewayne BuntingBrian S Crenshaw, MD;  [facility];  Service: Cardiovascular;  Laterality: N/A;  10a CV  . TEE WITHOUT CARDIOVERSION  06/24/2012   Procedure: TRANSESOPHAGEAL ECHOCARDIOGRAM (TEE);  Surgeon: Peter M SwazilandJordan, MD;  [facility];  Service: Cardiovascular;  Laterality: N/A;  . UMBILICAL HERNIA REPAIR     x 2    Family History  Problem Relation Age of Onset  . Hypertension Mother   . Hypertension Father   . Heart attack Father 3444    Died  . Diabetes Father   . Kidney disease Father  with transplant  . Stroke Paternal Grandmother     Social History:  reports that he has never smoked. He quit smokeless tobacco use about 13 years ago. His smokeless tobacco use included Chew. He reports that he drinks alcohol. He reports that he does not use drugs.  Allergies: No Known Allergies  Medications: Scheduled: Continuous:  No results found for this or any previous visit (from the past 24 hour(s)).   No results found.  ROS:  As stated above in the HPI otherwise negative.  There were no vitals taken for this visit.     PE: Gen: NAD, Alert and Oriented HEENT:  [place]/AT, EOMI Neck: Supple, no LAD Lungs: CTA Bilaterally CV: RRR without M/G/R ABM: Soft, NTND, +BS Ext: No C/C/E  Assessment/Plan: 1) Screening colonoscopy. 2) GERD - EGD.  Commodore Bellew D 07/06/2016, 12:47 PM

## 2016-07-07 ENCOUNTER — Encounter (HOSPITAL_COMMUNITY)
Admission: RE | Disposition: A | Discharge status: Home / Self Care | Payer: Self-pay | Source: Ambulatory Visit | Attending: Gastroenterology

## 2016-07-07 ENCOUNTER — Ambulatory Visit (HOSPITAL_COMMUNITY): Payer: BLUE CROSS/BLUE SHIELD | Admitting: Registered Nurse

## 2016-07-07 ENCOUNTER — Encounter (HOSPITAL_COMMUNITY): Payer: Self-pay | Admitting: Anesthesiology

## 2016-07-07 ENCOUNTER — Ambulatory Visit (HOSPITAL_COMMUNITY)
Admission: RE | Admit: 2016-07-07 | Discharge: 2016-07-07 | Disposition: A | Discharge status: Home / Self Care | Payer: BLUE CROSS/BLUE SHIELD | Source: Ambulatory Visit | Attending: Gastroenterology | Admitting: Gastroenterology

## 2016-07-07 DIAGNOSIS — L509 Urticaria, unspecified: Secondary | ICD-10-CM | POA: Diagnosis not present

## 2016-07-07 DIAGNOSIS — E538 Deficiency of other specified B group vitamins: Secondary | ICD-10-CM | POA: Diagnosis not present

## 2016-07-07 DIAGNOSIS — Z833 Family history of diabetes mellitus: Secondary | ICD-10-CM | POA: Diagnosis not present

## 2016-07-07 DIAGNOSIS — Z841 Family history of disorders of kidney and ureter: Secondary | ICD-10-CM | POA: Insufficient documentation

## 2016-07-07 DIAGNOSIS — Z1211 Encounter for screening for malignant neoplasm of colon: Secondary | ICD-10-CM | POA: Diagnosis not present

## 2016-07-07 DIAGNOSIS — K210 Gastro-esophageal reflux disease with esophagitis: Secondary | ICD-10-CM | POA: Diagnosis not present

## 2016-07-07 DIAGNOSIS — Z8249 Family history of ischemic heart disease and other diseases of the circulatory system: Secondary | ICD-10-CM | POA: Diagnosis not present

## 2016-07-07 DIAGNOSIS — I10 Essential (primary) hypertension: Secondary | ICD-10-CM | POA: Diagnosis not present

## 2016-07-07 DIAGNOSIS — I110 Hypertensive heart disease with heart failure: Secondary | ICD-10-CM | POA: Diagnosis not present

## 2016-07-07 DIAGNOSIS — I481 Persistent atrial fibrillation: Secondary | ICD-10-CM | POA: Insufficient documentation

## 2016-07-07 DIAGNOSIS — R1013 Epigastric pain: Secondary | ICD-10-CM | POA: Diagnosis not present

## 2016-07-07 DIAGNOSIS — I739 Peripheral vascular disease, unspecified: Secondary | ICD-10-CM | POA: Insufficient documentation

## 2016-07-07 DIAGNOSIS — I428 Other cardiomyopathies: Secondary | ICD-10-CM | POA: Insufficient documentation

## 2016-07-07 DIAGNOSIS — Z8619 Personal history of other infectious and parasitic diseases: Secondary | ICD-10-CM | POA: Diagnosis not present

## 2016-07-07 DIAGNOSIS — K909 Intestinal malabsorption, unspecified: Secondary | ICD-10-CM | POA: Insufficient documentation

## 2016-07-07 DIAGNOSIS — Z9884 Bariatric surgery status: Secondary | ICD-10-CM | POA: Diagnosis not present

## 2016-07-07 DIAGNOSIS — M542 Cervicalgia: Secondary | ICD-10-CM | POA: Diagnosis not present

## 2016-07-07 DIAGNOSIS — R040 Epistaxis: Secondary | ICD-10-CM | POA: Diagnosis not present

## 2016-07-07 DIAGNOSIS — Z823 Family history of stroke: Secondary | ICD-10-CM | POA: Diagnosis not present

## 2016-07-07 DIAGNOSIS — I429 Cardiomyopathy, unspecified: Secondary | ICD-10-CM | POA: Diagnosis not present

## 2016-07-07 DIAGNOSIS — D509 Iron deficiency anemia, unspecified: Secondary | ICD-10-CM | POA: Diagnosis not present

## 2016-07-07 DIAGNOSIS — K5730 Diverticulosis of large intestine without perforation or abscess without bleeding: Secondary | ICD-10-CM | POA: Diagnosis not present

## 2016-07-07 DIAGNOSIS — Z720 Tobacco use: Secondary | ICD-10-CM | POA: Insufficient documentation

## 2016-07-07 DIAGNOSIS — Z8673 Personal history of transient ischemic attack (TIA), and cerebral infarction without residual deficits: Secondary | ICD-10-CM | POA: Insufficient documentation

## 2016-07-07 DIAGNOSIS — Z6839 Body mass index (BMI) 39.0-39.9, adult: Secondary | ICD-10-CM | POA: Insufficient documentation

## 2016-07-07 DIAGNOSIS — K208 Other esophagitis: Secondary | ICD-10-CM | POA: Diagnosis not present

## 2016-07-07 DIAGNOSIS — M1990 Unspecified osteoarthritis, unspecified site: Secondary | ICD-10-CM | POA: Insufficient documentation

## 2016-07-07 DIAGNOSIS — G4730 Sleep apnea, unspecified: Secondary | ICD-10-CM | POA: Diagnosis not present

## 2016-07-07 DIAGNOSIS — D61818 Other pancytopenia: Secondary | ICD-10-CM | POA: Diagnosis not present

## 2016-07-07 DIAGNOSIS — Z980 Intestinal bypass and anastomosis status: Secondary | ICD-10-CM | POA: Insufficient documentation

## 2016-07-07 DIAGNOSIS — I5042 Chronic combined systolic (congestive) and diastolic (congestive) heart failure: Secondary | ICD-10-CM | POA: Diagnosis not present

## 2016-07-07 HISTORY — PX: COLONOSCOPY: SHX5424

## 2016-07-07 HISTORY — PX: ESOPHAGOGASTRODUODENOSCOPY: SHX5428

## 2016-07-07 SURGERY — EGD (ESOPHAGOGASTRODUODENOSCOPY)
Anesthesia: Monitor Anesthesia Care

## 2016-07-07 MED ORDER — PROPOFOL 10 MG/ML IV BOLUS
INTRAVENOUS | Status: AC
  Filled 2016-07-07: qty 40

## 2016-07-07 MED ORDER — SODIUM CHLORIDE 0.9 % IV SOLN: INTRAVENOUS | Status: DC

## 2016-07-07 MED ORDER — PROPOFOL 500 MG/50ML IV EMUL
INTRAVENOUS | Status: DC | PRN
  Administered 2016-07-07: 200 ug/kg/min via INTRAVENOUS

## 2016-07-07 MED ORDER — LACTATED RINGERS IV SOLN
INTRAVENOUS | Status: DC | PRN
  Administered 2016-07-07: 14:00:00 via INTRAVENOUS

## 2016-07-07 MED ORDER — LACTATED RINGERS IV SOLN
INTRAVENOUS | Status: DC
  Administered 2016-07-07: 1000 mL via INTRAVENOUS

## 2016-07-07 MED ORDER — ONDANSETRON HCL 4 MG/2ML IJ SOLN: 4.0000 mg | Freq: Once | INTRAMUSCULAR | Status: DC | PRN

## 2016-07-07 MED ORDER — PROPOFOL 10 MG/ML IV BOLUS
INTRAVENOUS | Status: AC
  Filled 2016-07-07: qty 20

## 2016-07-07 NOTE — Anesthesia Preprocedure Evaluation (Signed)
Anesthesia Evaluation  Patient identified by MRN, date of birth, ID band Patient awake    Reviewed: Allergy & Precautions, NPO status , Patient's Chart, lab work & pertinent test results  Airway Mallampati: III  TM Distance: >3 FB Neck ROM: Full    Dental no notable dental hx. (+) Teeth Intact   Pulmonary sleep apnea and Continuous Positive Airway Pressure Ventilation ,    Pulmonary exam normal breath sounds clear to auscultation       Cardiovascular hypertension, Pt. on medications + Peripheral Vascular Disease  Normal cardiovascular exam+ dysrhythmias Atrial Fibrillation  Rhythm:Irregular Rate:Normal     Neuro/Psych Hx/o Guillian-Barre after stroke  Neuromuscular disease CVA, No Residual Symptoms    GI/Hepatic Neg liver ROS, GERD  Medicated and Controlled,S/P Gastric bypass Screening colonoscopy and EGD   Endo/Other  Morbid obesity  Renal/GU negative Renal ROS  negative genitourinary   Musculoskeletal  (+) Arthritis , Osteoarthritis,    Abdominal   Peds  Hematology  (+) anemia , On xarelto-last dose 5 days ago   Anesthesia Other Findings   Reproductive/Obstetrics                             Anesthesia Physical Anesthesia Plan  ASA: III  Anesthesia Plan: MAC   Post-op Pain Management:    Induction:   Airway Management Planned: Natural Airway  Additional Equipment:   Intra-op Plan:   Post-operative Plan:   Informed Consent: I have reviewed the patients History and Physical, chart, labs and discussed the procedure including the risks, benefits and alternatives for the proposed anesthesia with the patient or authorized representative who has indicated his/her understanding and acceptance.     Plan Discussed with: CRNA, Anesthesiologist and Surgeon  Anesthesia Plan Comments:         Anesthesia Quick Evaluation

## 2016-07-07 NOTE — Op Note (Signed)
[facility] Patient Name: Tyrone SellsJohn [name] Procedure Date: 07/07/2016 MRN: [id number] Attending MD: Jeani HawkingPatrick Joanann Mies , MD Date of Birth: 1965/11/24 CSN: [id number] Age: 50 Admit Type: Outpatient Procedure:                Colonoscopy Indications:              Screening for colorectal malignant neoplasm Providers:                Jeani HawkingPatrick Latandra Loureiro, MD, Dow AdolphKaren Hinson, RN, Harrington ChallengerHope Parker,                            Technician Referring MD:              Medicines:                Propofol per Anesthesia Complications:            No immediate complications. Estimated Blood Loss:     Estimated blood loss: none. Procedure:                Pre-Anesthesia Assessment:                           - Prior to the procedure, a History and Physical                            was performed, and patient medications and                            allergies were reviewed. The patient's tolerance of                            previous anesthesia was also reviewed. The risks                            and benefits of the procedure and the sedation                            options and risks were discussed with the patient.                            All questions were answered, and informed consent                            was obtained. Prior Anticoagulants: The patient has                            taken no previous anticoagulant or antiplatelet                            agents. ASA Grade Assessment: III - A patient with                            severe systemic disease. After reviewing the risks  and benefits, the patient was deemed in                            satisfactory condition to undergo the procedure.                           - Sedation was administered by an anesthesia                            professional. Deep sedation was attained.                           After obtaining informed consent, the colonoscope                            was passed under direct vision.  Throughout the                            procedure, the patient's blood pressure, pulse, and                            oxygen saturations were monitored continuously. The                            EC-[id number] ([id number]) scope was introduced through                            the anus and advanced to the the cecum, identified                            by appendiceal orifice and ileocecal valve. The                            colonoscopy was performed without difficulty. The                            patient tolerated the procedure well. The quality                            of the bowel preparation was good. The ileocecal                            valve, appendiceal orifice, and rectum were                            photographed. Scope In: 2:17:04 PM Scope Out: 2:33:29 PM Scope Withdrawal Time: 0 hours 11 minutes 6 seconds  Total Procedure Duration: 0 hours 16 minutes 25 seconds  Findings:      The entire examined colon appeared normal. Impression:               - The entire examined colon is normal.                           - No specimens collected. Moderate Sedation:  N/A- Per Anesthesia Care Recommendation:           - Patient has a contact number available for                            emergencies. The signs and symptoms of potential                            delayed complications were discussed with the                            patient. Return to normal activities tomorrow.                            Written discharge instructions were provided to the                            patient.                           - Resume previous diet.                           - Continue present medications.                           - Await pathology results.                           - Repeat colonoscopy in 10 years for surveillance. Procedure Code(s):        --- Professional ---                           [phone], Colonoscopy, flexible; diagnostic, including                             collection of specimen(s) by brushing or washing,                            when performed (separate procedure) Diagnosis Code(s):        --- Professional ---                           Z12.11, Encounter for screening for malignant                            neoplasm of colon CPT copyright 2016 [employer]. All rights reserved. The codes documented in this report are preliminary and upon coder review may  be revised to meet current compliance requirements. Jeani Hawking, MD Jeani Hawking, MD 07/07/2016 2:51:05 PM This report has been signed electronically. Number of Addenda: 0

## 2016-07-07 NOTE — Anesthesia Postprocedure Evaluation (Signed)
Anesthesia Post Note  Patient: LORETO VINCE  Procedure(s) Performed: Procedure(s) (LRB): ESOPHAGOGASTRODUODENOSCOPY (EGD) (N/A) COLONOSCOPY (N/A)  Patient location during evaluation: PACU Anesthesia Type: MAC Level of consciousness: awake and alert and oriented Pain management: pain level controlled Vital Signs Assessment: post-procedure vital signs reviewed and stable Respiratory status: nonlabored ventilation, spontaneous breathing and respiratory function stable Cardiovascular status: stable and blood pressure returned to baseline Postop Assessment: no signs of nausea or vomiting Anesthetic complications: no    Last Vitals:  Vitals:   07/07/16 1450 07/07/16 1500  BP: 126/77 127/72  Pulse: (!) 57 (!) 54  Resp: 15 18  Temp:      Last Pain:  Vitals:   07/07/16 1442  TempSrc: Oral                 Nyan Dufresne A.

## 2016-07-07 NOTE — Op Note (Signed)
[facility] Patient Name: Tyrone [name] Procedure Date: 07/07/2016 MRN: [id number] Attending MD: Jeani Hawking , MD Date of Birth: 14-Jun-1966 CSN: [id number] Age: 50 Admit Type: Outpatient Procedure:                Upper GI endoscopy Indications:              Iron deficiency anemia, Heartburn Providers:                Jeani Hawking, MD, Janae Sauce. Steele Berg, RN, Harrington Challenger,                            Technician Referring MD:              Medicines:                Propofol per Anesthesia Complications:            No immediate complications. Estimated Blood Loss:     Estimated blood loss: none. Procedure:                Pre-Anesthesia Assessment:                           - Prior to the procedure, a History and Physical                            was performed, and patient medications and                            allergies were reviewed. The patient's tolerance of                            previous anesthesia was also reviewed. The risks                            and benefits of the procedure and the sedation                            options and risks were discussed with the patient.                            All questions were answered, and informed consent                            was obtained. Prior Anticoagulants: The patient has                            taken Xarelto (rivaroxaban), last dose was 2 days                            prior to procedure. ASA Grade Assessment: III - A                            patient with severe systemic disease. After  reviewing the risks and benefits, the patient was                            deemed in satisfactory condition to undergo the                            procedure.                           After obtaining informed consent, the endoscope was                            passed under direct vision. Throughout the                            procedure, the patient's blood pressure, pulse, and                        oxygen saturations were monitored continuously. The                            EC-[id number] ([id number]) scope was introduced through                            the mouth, and advanced to the proximal jejunum.                            The upper GI endoscopy was accomplished without                            difficulty. The patient tolerated the procedure                            well. Scope In: Scope Out: Findings:      LA Grade A (one or more mucosal breaks less than 5 mm, not extending       between tops of 2 mucosal folds) esophagitis with no bleeding was found.      Evidence of a Roux-en-Y gastrojejunostomy was found. The gastrojejunal       anastomosis was characterized by healthy appearing mucosa. This was       traversed. The pouch-to-jejunum limb was characterized by healthy       appearing mucosa. The duodenum-to-jejunum limb was examined.      The examined jejunum was normal. Impression:               - LA Grade A reflux esophagitis.                           - Roux-en-Y gastrojejunostomy with gastrojejunal                            anastomosis characterized by healthy appearing                            mucosa. This is the source of his iron deficiency  anemia.                           - Normal examined jejunum.                           - No specimens collected. Moderate Sedation:      N/A- Per Anesthesia Care Recommendation:           - Patient has a contact number available for                            emergencies. The signs and symptoms of potential                            delayed complications were discussed with the                            patient. Return to normal activities tomorrow.                            Written discharge instructions were provided to the                            patient.                           - Resume previous diet.                           - Continue present medications.                            - Discharge patient to home.                           - PPI.                           - Iron supplementation. Procedure Code(s):        --- Professional ---                           [phone], Esophagogastroduodenoscopy, flexible,                            transoral; diagnostic, including collection of                            specimen(s) by brushing or washing, when performed                            (separate procedure) Diagnosis Code(s):        --- Professional ---                           K21.0, Gastro-esophageal reflux disease with  esophagitis                           Z98.0, Intestinal bypass and anastomosis status                           D50.9, Iron deficiency anemia, unspecified                           R12, Heartburn CPT copyright 2016 American Medical Association. All rights reserved. The codes documented in this report are preliminary and upon coder review may  be revised to meet current compliance requirements. Jeani Hawking, MD Jeani Hawking, MD 07/07/2016 3:02:24 PM This report has been signed electronically. Number of Addenda: 0

## 2016-07-07 NOTE — Transfer of Care (Signed)
Immediate Anesthesia Transfer of Care Note  Patient: Tyrone [name]  Procedure(s) Performed: Procedure(s): ESOPHAGOGASTRODUODENOSCOPY (EGD) (N/A) COLONOSCOPY (N/A)  Patient Location: PACU  Anesthesia Type:MAC  Level of Consciousness: awake, alert , oriented and patient cooperative  Airway & Oxygen Therapy: Patient Spontanous Breathing and Patient connected to face mask oxygen  Post-op Assessment: Report given to RN, Post -op Vital signs reviewed and stable and Patient moving all extremities X 4  Post vital signs: stable  Last Vitals:  Vitals:   07/07/16 1356  BP: 138/80  Pulse: (!) 52  Resp: 15  Temp: 36.9 C    Last Pain:  Vitals:   07/07/16 1356  TempSrc: Oral         Complications: No apparent anesthesia complications

## 2016-07-07 NOTE — Discharge Instructions (Signed)

## 2016-07-08 DIAGNOSIS — H1033 Unspecified acute conjunctivitis, bilateral: Secondary | ICD-10-CM | POA: Diagnosis not present

## 2016-07-10 ENCOUNTER — Encounter (HOSPITAL_COMMUNITY): Payer: Self-pay | Admitting: Gastroenterology

## 2016-07-10 DIAGNOSIS — H0012 Chalazion right lower eyelid: Secondary | ICD-10-CM | POA: Diagnosis not present

## 2016-07-10 DIAGNOSIS — H01024 Squamous blepharitis left upper eyelid: Secondary | ICD-10-CM | POA: Diagnosis not present

## 2016-07-10 DIAGNOSIS — H01021 Squamous blepharitis right upper eyelid: Secondary | ICD-10-CM | POA: Diagnosis not present

## 2016-07-10 DIAGNOSIS — H01022 Squamous blepharitis right lower eyelid: Secondary | ICD-10-CM | POA: Diagnosis not present

## 2016-07-19 ENCOUNTER — Other Ambulatory Visit: Payer: Self-pay | Admitting: Internal Medicine

## 2016-07-20 ENCOUNTER — Encounter: Payer: Self-pay | Admitting: Neurology

## 2016-07-21 DIAGNOSIS — G4730 Sleep apnea, unspecified: Secondary | ICD-10-CM | POA: Diagnosis not present

## 2016-07-21 DIAGNOSIS — G4733 Obstructive sleep apnea (adult) (pediatric): Secondary | ICD-10-CM | POA: Diagnosis not present

## 2016-07-26 ENCOUNTER — Ambulatory Visit: Payer: BLUE CROSS/BLUE SHIELD | Admitting: Neurology

## 2016-08-02 ENCOUNTER — Ambulatory Visit: Payer: BLUE CROSS/BLUE SHIELD | Admitting: Neurology

## 2016-08-03 DIAGNOSIS — D509 Iron deficiency anemia, unspecified: Secondary | ICD-10-CM | POA: Diagnosis not present

## 2016-08-03 DIAGNOSIS — R194 Change in bowel habit: Secondary | ICD-10-CM | POA: Diagnosis not present

## 2016-08-03 DIAGNOSIS — K210 Gastro-esophageal reflux disease with esophagitis: Secondary | ICD-10-CM | POA: Diagnosis not present

## 2016-08-14 ENCOUNTER — Encounter: Payer: Self-pay | Admitting: Physician Assistant

## 2016-08-21 DIAGNOSIS — I10 Essential (primary) hypertension: Secondary | ICD-10-CM | POA: Diagnosis not present

## 2016-09-13 ENCOUNTER — Ambulatory Visit (INDEPENDENT_AMBULATORY_CARE_PROVIDER_SITE_OTHER): Payer: BLUE CROSS/BLUE SHIELD | Admitting: Neurology

## 2016-09-13 VITALS — BP 124/82 | HR 60 | Ht 65.0 in | Wt 241.8 lb

## 2016-09-13 DIAGNOSIS — G250 Essential tremor: Secondary | ICD-10-CM

## 2016-09-13 MED ORDER — GABAPENTIN 300 MG PO CAPS: 300.0000 mg | ORAL_CAPSULE | Freq: Three times a day (TID) | ORAL | 11 refills | Status: DC

## 2016-09-13 MED ORDER — GABAPENTIN 300 MG PO CAPS: 300.0000 mg | ORAL_CAPSULE | Freq: Three times a day (TID) | ORAL | 4 refills | Status: DC

## 2016-09-13 NOTE — Patient Instructions (Addendum)
Remember to drink plenty of fluid, eat healthy meals and do not skip any meals. Try to eat protein with a every meal and eat a healthy snack such as fruit or nuts in between meals. Try to keep a regular sleep-wake schedule and try to exercise daily, particularly in the form of walking, 20-30 minutes a day, if you can.   As far as your medications are concerned, I would like to suggest: Gabapentin 300mg  three times a day  I would like to see you back in 6-9 months, sooner if we need to. Please call us with any interim questions, concerns, problems, updates or refill requests.   Please also call us for any test results so we can go over those with you on the phone.  My clinical assistant and will answer any of your questions and relay your messages to me and also relay most of my messages to you.   Our phone number is [phone]. We also have an after hours call service for urgent matters and there is a physician on-call for urgent questions. For any emergencies you know to call 911 or go to the nearest emergency room  Gabapentin capsules or tablets What is this medicine? GABAPENTIN (GA ba pen tin) is used to control partial seizures in adults with epilepsy. It is also used to treat certain types of nerve pain. This medicine may be used for other purposes; ask your health care provider or pharmacist if you have questions. COMMON BRAND NAME(S): Active-PAC with Gabapentin, Gabarone, Neurontin What should I tell my health care provider before I take this medicine? They need to know if you have any of these conditions: -kidney disease -suicidal thoughts, plans, or attempt; a previous suicide attempt by you or a family member -an unusual or allergic reaction to gabapentin, other medicines, foods, dyes, or preservatives -pregnant or trying to get pregnant -breast-feeding How should I use this medicine? Take this medicine by mouth with a glass of water. Follow the directions on the prescription  label. You can take it with or without food. If it upsets your stomach, take it with food.Take your medicine at regular intervals. Do not take it more often than directed. Do not stop taking except on your doctor's advice. If you are directed to break the 600 or 800 mg tablets in half as part of your dose, the extra half tablet should be used for the next dose. If you have not used the extra half tablet within 28 days, it should be thrown away. A special MedGuide will be given to you by the pharmacist with each prescription and refill. Be sure to read this information carefully each time. Talk to your pediatrician regarding the use of this medicine in children. Special care may be needed. Overdosage: If you think you have taken too much of this medicine contact a poison control center or emergency room at once. NOTE: This medicine is only for you. Do not share this medicine with others. What if I miss a dose? If you miss a dose, take it as soon as you can. If it is almost time for your next dose, take only that dose. Do not take double or extra doses. What may interact with this medicine? Do not take this medicine with any of the following medications: -other gabapentin products This medicine may also interact with the following medications: -alcohol -antacids -antihistamines for allergy, cough and cold -certain medicines for anxiety or sleep -certain medicines for depression or psychotic disturbances -homatropine; hydrocodone -  naproxen -narcotic medicines (opiates) for pain -phenothiazines like chlorpromazine, mesoridazine, prochlorperazine, thioridazine This list may not describe all possible interactions. Give your health care provider a list of all the medicines, herbs, non-prescription drugs, or dietary supplements you use. Also tell them if you smoke, drink alcohol, or use illegal drugs. Some items may interact with your medicine. What should I watch for while using this medicine? Visit  your doctor or health care professional for regular checks on your progress. You may want to keep a record at home of how you feel your condition is responding to treatment. You may want to share this information with your doctor or health care professional at each visit. You should contact your doctor or health care professional if your seizures get worse or if you have any new types of seizures. Do not stop taking this medicine or any of your seizure medicines unless instructed by your doctor or health care professional. Stopping your medicine suddenly can increase your seizures or their severity. Wear a medical identification bracelet or chain if you are taking this medicine for seizures, and carry a card that lists all your medications. You may get drowsy, dizzy, or have blurred vision. Do not drive, use machinery, or do anything that needs mental alertness until you know how this medicine affects you. To reduce dizzy or fainting spells, do not sit or stand up quickly, especially if you are an older patient. Alcohol can increase drowsiness and dizziness. Avoid alcoholic drinks. Your mouth may get dry. Chewing sugarless gum or sucking hard candy, and drinking plenty of water will help. The use of this medicine may increase the chance of suicidal thoughts or actions. Pay special attention to how you are responding while on this medicine. Any worsening of mood, or thoughts of suicide or dying should be reported to your health care professional right away. Women who become pregnant while using this medicine may enroll in the [place] American Antiepileptic Drug Pregnancy Registry by calling [phone]. This registry collects information about the safety of antiepileptic drug use during pregnancy. What side effects may I notice from receiving this medicine? Side effects that you should report to your doctor or health care professional as soon as possible: -allergic reactions like skin rash, itching or hives,  swelling of the face, lips, or tongue -worsening of mood, thoughts or actions of suicide or dying Side effects that usually do not require medical attention (report to your doctor or health care professional if they continue or are bothersome): -constipation -difficulty walking or controlling muscle movements -dizziness -nausea -slurred speech -tiredness -tremors -weight gain This list may not describe all possible side effects. Call your doctor for medical advice about side effects. You may report side effects to FDA at 1-800-FDA-1088. Where should I keep my medicine? Keep out of reach of children. This medicine may cause accidental overdose and death if it taken by other adults, children, or pets. Mix any unused medicine with a substance like cat litter or coffee grounds. Then throw the medicine away in a sealed container like a sealed bag or a coffee can with a lid. Do not use the medicine after the expiration date. Store at room temperature between 15 and 30 degrees C (59 and 86 degrees F). NOTE: This sheet is a summary. It may not cover all possible information. If you have questions about this medicine, talk to your doctor, pharmacist, or health care provider.  2017 Elsevier/Gold Standard (2013-09-05 15:26:50)  Essential Tremor Introduction A tremor is trembling  or shaking that you cannot control. Most tremors affect the hands or arms. Tremors can also affect the head, vocal cords, face, and other parts of the body. Essential tremor is a tremor without a known cause. What are the causes? Essential tremor has no known cause. What increases the risk? You may be at greater risk of essential tremor if:  You have a family member with essential tremor.  You are age 77 or older.  You take certain medicines. What are the signs or symptoms? The main sign of a tremor is uncontrolled and unintentional rhythmic shaking of a body part.  You may have difficulty eating with a spoon or  fork.  You may have difficulty writing.  You may nod your head up and down or side to side.  You may have a quivering voice. Your tremors:  May get worse over time.  May come and go.  May be more noticeable on one side of your body.  May get worse due to stress, fatigue, caffeine, and extreme heat or cold. How is this diagnosed? Your health care provider can diagnose essential tremor based on your symptoms, medical history, and a physical examination. There is no single test to diagnose an essential tremor. However, your health care provider may perform a variety of tests to rule out other conditions. Tests may include:  Blood and urine tests.  Imaging studies of your brain, such as:  CT scan.  MRI.  A test that measures involuntary muscle movement (electromyogram). How is this treated? Your tremors may go away without treatment. Mild tremors may not need treatment if they do not affect your day-to-day life. Severe tremors may need to be treated using one or a combination of the following options:  Medicines. This may include medicine that is injected.  Lifestyle changes.  Physical therapy. Follow these instructions at home:  Take medicines only as directed by your health care provider.  Limit alcohol intake to no more than 1 drink per day for nonpregnant women and 2 drinks per day for men. One drink equals 12 oz of beer, 5 oz of wine, or 1 oz of hard liquor.  Do not use any tobacco products, including cigarettes, chewing tobacco, or electronic cigarettes. If you need help quitting, ask your health care provider.  Take medicines only as directed by your health care provider.  Avoid extreme heat or cold.  Limit the amount of caffeine you consumeas directed by your health care provider.  Try to get eight hours of sleep each night.  Find ways to manage your stress, such as meditation or yoga.  Keep all follow-up visits as directed by your health care provider.  This is important. This includes any physical therapy visits. Contact a health care provider if:  You experience any changes in the location or intensity of your tremors.  You start having a tremor after starting a new medicine.  You have tremor with other symptoms such as:  Numbness.  Tingling.  Pain.  Weakness.  Your tremor gets worse.  Your tremor interferes with your daily life. This information is not intended to replace advice given to you by your health care provider. Make sure you discuss any questions you have with your health care provider. Document Released: 07/31/2014 Document Revised: 12/16/2015 Document Reviewed: 01/05/2014  2017 Elsevier

## 2016-09-13 NOTE — Progress Notes (Signed)
[facility]    Provider:  Dr Lucia Gaskins Primary Care Physician:  Valentino Saxon  CC: Tremors  Interval history 09/13/2016: 51 year old here for follow up of essential tremor and headache. Patient was started on a very low dose of propranolol for his tremor but he stopped it due to concerns for low blood pressure in the setting of bleeding. No more headaches since they stopped the bleeding, he was anemic and was evaluated by GI with EGD. He cannot take primidone due to his Xarelto. At some point we may retry propranolol, he was only on 10mg  twice daily. Tremors are worsening, Having difficulty with eating and handwriting. No caffeine.   MRI brain 04/15/2016: Unremarkable MRI brain (without). No acute findings. Compared to MRI on 05/01/12, prior acute infarct in right parietal region is no longer seen. The right frontal focus of gliosis is stable.   Interval History 04/12/2016: Patient here for a new issue today new onset headache. They went to urgent care on Sunday and we encouraged him to go to the emergency room yesterday and they did not. He needs to call his primary care for his diarrhea and vomiting. 2 weeks ago started dull headache on the back of the head radiating to the front of the head. The pain starts in the left occipital area and radiates to the front. There is a know where the pain starts. headches have been worsening, they are severe, he has been vomiting. Over the weekend he kept ice on his head. He could not get out of the bed all say Sunday. They went to urgent care Sunday and he was given hydrocodone. Stools was initially red streaked and now is charcoal/black. He is having severe diarrhea. He goes 15 times a day. He is dizzy. He is lightheaded. He is fatigued. He has had severe headache. The headache is continuous. It is a dull sever ache. No focal neurologic deficits. Sunday temperature was 103.6.   Interval history 01/10/2016: TSH was normal. Cutting out caffeine  didn;t help. He has gotten used to it. Wife thinks it is worse. He would like treatment. Discussed essential tremor, diagnosis, features, likely progression, and the multiple ways to manage. Decided to try propranolol and he will email me with his progress and we can further titrate as needed, discussed side effects and other medictaions we cna use, deep brain stimulation in severe cases.   HPI: ADOM SCHOENECK is a 51 y.o. male here as a referral from Dr. Sherryll Burger for Tremors. Past medical history of hypertension and atrial fibrillation, gastric bypass,. Her with his wife who also provides information. He noticed it initially when he was handing someone some papers. He has some left-sided weakness after a stroke. He has slight tremor left >right hand mostly while doing something. No associated tremor in the face or legs. He drinks a lot of caffeinated tea, 3-4 cups a day. He had lost over 200 pounds with gastric bypass. He goes to Dr. Clent Ridges in [place] for the gastric bypass but has not seen him recently. He is followed by his primary care and vitamin levels are checked to ensure no deficiency secondary to his bypass, he was found to be B12 deficient and now he takes b12 and multivitamins. No family history of tremors. Tremor is worse some days. May be stress related. Notices it most with reading a paper, he feels the paper shaking. They first noticed ths symptoms a few months ago. No resting tremor. He doesn't notice anything  inparticular that makes it better or worse. Hasn't noticed effects of alcohol on the tremor. No other focal neurologic deficits.   Reviewed notes, labs and imaging from outside physicians, which showed:  MRI of the brain (personally reviewed images and agree with the following) Comparison: CT 05/01/2012  Findings: Small area of acute infarction in the right parietal cortex. No associated hemorrhage.  Small area of chronic infarct in the right frontal white matter. No other  significant ischemic change. Brainstem and cerebellum are normal. Basal ganglia is intact. Empty sella is noted with a small pituitary gland in the floor of the sella.  The vessels at the base of the brain are patent. Paranasal sinuses are clear.  IMPRESSION: Small area of acute infarction right parietal cortex.  Review of Systems: Patient complains of symptoms per HPI as well as the following symptoms: Loss of vision, easy bruising, easy bleeding, cough, snoring, rash, weakness, tremor, snoring, racing thoughts Pertinent negatives per HPI. All others negative.   Social History   Social History  . Marital status: Married    Spouse name: N/A  . Number of children: 1  . Years of education: N/A   Occupational History  . Realtor    Social History Main Topics  . Smoking status: Never Smoker  . Smokeless tobacco: Former Neurosurgeon    Types: Chew    Quit date: 11/11/2002  . Alcohol use Yes     Comment: occasional  . Drug use: No  . Sexual activity: Yes   Other Topics Concern  . Not on file   Social History Narrative   Lives with wife.  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 Hypertension Mother   . Hypertension Father   . Heart attack Father 29    Died  . Diabetes Father   . Kidney disease Father     with transplant  . Stroke Paternal Grandmother     Past Medical History:  Diagnosis Date  . Cervicalgia   . CVA (cerebral infarction) 05/01/12   on pradaxa at time of stroke, switched to xarelto  . External thrombosed hemorrhoids   . GERD (gastroesophageal reflux disease)   . Guillain Barr syndrome [facility])    following seasonal influenza vaccination  . Guillain-Barre syndrome (HCC)    from stroke in 2013  . Hematuria    a. 01/2012 - to f/u with urology as outpt.  Marland Kitchen Hernia of unspecified site of abdominal cavity without mention of obstruction or gangrene    of abdominal  . Hypertension   .  Morbid obesity s/p gastric bypass   . Nonischemic cardiomyopathy (HCC)    a. 01/2012 Echo: EF 35-40%, Diff HK, mild MR;  b. 01/2012 R&L Heart Cath: mildly elevated R heart pressures (pcwp 17) , nl cors.  . Osteoarthrosis, unspecified whether generalized or localized, ankle and foot    of the knees,ankle and foot  . Pancytopenia   . Persistent atrial fibrillation (HCC)    a. Pradaxa started 01/2012;  b .s/p TEE/DCCV 02/16/2012; c. recurrent afib->Tikosyn Initiation 03/2012  . Sleep apnea    CPAP compliant  . Unspecified intestinal malabsorption   . Urticaria, unspecified   . Vitamin B 12 deficiency     Past Surgical History:  Procedure Laterality Date  . ATRIAL FIBRILLATION ABLATION N/A 06/25/2012   PVI by Dr [name]  . CARDIOVERSION  02/16/2012   Procedure: CARDIOVERSION;  Surgeon: Madolyn Frieze  Jens Som, MD;  Location: [facility] ENDOSCOPY;  Service: Cardiovascular;  Laterality: N/A;  . CARDIOVERSION  04/22/2012   Procedure: CARDIOVERSION;  Surgeon: [name], MD;  Location: [name] ENDOSCOPY;  Service: Cardiovascular;  Laterality: N/A;  . COLONOSCOPY N/A 07/07/2016   Procedure: COLONOSCOPY;  Surgeon: Jeani Hawking, MD;  Location: [facility] ENDOSCOPY;  Service: Endoscopy;  Laterality: N/A;  . ESOPHAGOGASTRODUODENOSCOPY N/A 07/07/2016   Procedure: ESOPHAGOGASTRODUODENOSCOPY (EGD);  Surgeon: Jeani Hawking, MD;  Location: [facility] ENDOSCOPY;  Service: Endoscopy;  Laterality: N/A;  . GASTRIC BYPASS  2004  . HERNIA REPAIR    . LEFT AND RIGHT HEART CATHETERIZATION WITH CORONARY ANGIOGRAM N/A 02/13/2012   Procedure: LEFT AND RIGHT HEART CATHETERIZATION WITH CORONARY ANGIOGRAM;  Surgeon: Tonny Bollman, MD;  Location: [facility] CATH LAB;  Service: Cardiovascular;  Laterality: N/A;  . TEE WITHOUT CARDIOVERSION  02/16/2012   Procedure: TRANSESOPHAGEAL ECHOCARDIOGRAM (TEE);  Surgeon: [name], MD;  Location: [facility] ENDOSCOPY;  Service: Cardiovascular;  Laterality: N/A;  10a CV  . TEE WITHOUT CARDIOVERSION  06/24/2012   Procedure:  TRANSESOPHAGEAL ECHOCARDIOGRAM (TEE);  Surgeon: [name], MD;  Location: [facility] ENDOSCOPY;  Service: Cardiovascular;  Laterality: N/A;  . UMBILICAL HERNIA REPAIR     x 2    Current Outpatient Prescriptions  Medication Sig Dispense Refill  . amLODipine (NORVASC) 5 MG tablet TAKE ONE TABLET BY MOUTH ONCE DAILY 30 tablet 1  . Cyanocobalamin (VITAMIN B-12) 5000 MCG TBDP Take 5,000 mcg by mouth daily.    . furosemide (LASIX) 40 MG tablet Take 20 mg by mouth daily.    Marland Kitchen ibuprofen (ADVIL,MOTRIN) 200 MG tablet Take 400 mg by mouth every 8 (eight) hours as needed (for pain.).    Marland Kitchen losartan (COZAAR) 100 MG tablet Take 0.5 tablets (50 mg total) by mouth 2 (two) times daily. 90 tablet 3  . Methylcellulose, Laxative, (CITRUCEL) 500 MG TABS Take 500 mg by mouth daily.    . Multiple Vitamin (MULTIVITAMIN) tablet Take 1-2 tablets by mouth 2 (two) times daily. Take 2 tablets in the morning and 1 tablet in the evening    . omeprazole (PRILOSEC) 40 MG capsule   1  . sodium chloride (OCEAN) 0.65 % SOLN nasal spray Place 1-2 sprays into both nostrils 4 (four) times daily as needed (for nasal irritation/nose bleeds.).    Marland Kitchen spironolactone (ALDACTONE) 50 MG tablet TAKE ONE TABLET BY MOUTH ONCE DAILY 30 tablet 1  . vitamin E (VITAMIN E) 400 UNIT capsule Take 400 Units by mouth daily.    [name] Hurl 20 MG TABS tablet TAKE ONE TABLET BY MOUTH DAILY WITH  SUPPER 30 tablet 1  . gabapentin (NEURONTIN) 300 MG capsule Take 1 capsule (300 mg total) by mouth 3 (three) times daily. 270 capsule 4   No current facility-administered medications for this visit.    Facility-Administered Medications Ordered in Other Visits  Medication Dose Route Frequency Provider Last Rate Last Dose  . gadopentetate dimeglumine (MAGNEVIST) injection 20 mL  20 mL Intravenous Once PRN Anson Fret, MD        Allergies as of 09/13/2016  . (No Known Allergies)    Vitals: BP 124/82   Pulse 60   Ht 5\' 5"  (1.651 m)   Wt 241 lb 12.8 oz  (109.7 kg)   BMI 40.24 kg/m  Last Weight:  Wt Readings from Last 1 Encounters:  09/13/16 241 lb 12.8 oz (109.7 kg)   Last Height:   Ht Readings from Last 1 Encounters:  09/13/16 5\' 5"  (1.651  m)     Neuro: Detailed Neurologic Exam  Speech:  Speech is normal; fluent and spontaneous with normal comprehension.  Cognition:  The patient is oriented to person, place, and time;   recent and remote memory intact;   language fluent;   normal attention, concentration,   fund of knowledge Cranial Nerves:  The pupils are equal, round, and reactive to light. The fundi are flat Visual fields are full to finger confrontation. Extraocular movements are intact. Trigeminal sensation is intact and the muscles of mastication are normal. The face is symmetric. The palate elevates in the midline. Hearing intact. Voice is normal. Shoulder shrug is normal. The tongue has normal motion without fasciculations.   Coordination:  Normal finger to nose and heel to shin.   Gait:  Heel-toe and tandem gait are normal.   Motor Observation: Mild posturalhigh frequency and low amplitude tremor with kinetic components. No asymmetry, no atrophy.  Tone:  Normal muscle tone.   Posture:  Posture is normal. normal erect   Strength:  Strength is V/V in the upper and lower limbs.    Sensation: intact to LT   Reflex Exam:  DTR's:  Deep tendon reflexes in the upper and lower extremities are normal bilaterally.  Toes:  The toes are downgoing bilaterally.  Clonus:  Clonus is absent.   Assessment/Plan: 51 year old male here for follow up of tremor. Neurologic exam is again for mild high-frequency low amplitude postural tremor with kinetic components. Likely essential tremor.   - Did not tolerate propranolol - Cannot take primidone due to interactions with Xarelto - Will try Gabapentin 300mg  tid - f/u 6-9 months  CC: Dr. Sherryll Burger   A total  of 25  minutes was spent face-to-face with this patient. Over half this time was spent on counseling patient on the essential tremor diagnosis and different diagnostic and therapeutic options available.

## 2016-11-06 DIAGNOSIS — E669 Obesity, unspecified: Secondary | ICD-10-CM | POA: Diagnosis not present

## 2016-11-06 DIAGNOSIS — Z6841 Body Mass Index (BMI) 40.0 and over, adult: Secondary | ICD-10-CM | POA: Diagnosis not present

## 2016-11-08 DIAGNOSIS — Z0000 Encounter for general adult medical examination without abnormal findings: Secondary | ICD-10-CM | POA: Diagnosis not present

## 2016-11-08 DIAGNOSIS — E611 Iron deficiency: Secondary | ICD-10-CM | POA: Diagnosis not present

## 2016-11-08 DIAGNOSIS — E538 Deficiency of other specified B group vitamins: Secondary | ICD-10-CM | POA: Diagnosis not present

## 2016-11-08 DIAGNOSIS — Z23 Encounter for immunization: Secondary | ICD-10-CM | POA: Diagnosis not present

## 2016-11-08 DIAGNOSIS — Z125 Encounter for screening for malignant neoplasm of prostate: Secondary | ICD-10-CM | POA: Diagnosis not present

## 2016-11-08 DIAGNOSIS — E559 Vitamin D deficiency, unspecified: Secondary | ICD-10-CM | POA: Diagnosis not present

## 2016-12-01 ENCOUNTER — Encounter: Payer: Self-pay | Admitting: Hematology

## 2016-12-01 ENCOUNTER — Telehealth: Payer: Self-pay | Admitting: Hematology

## 2016-12-01 NOTE — Telephone Encounter (Signed)
Spoke to [place] from the referring office to schedule the pt an appt. Appt has been scheduled for the pt to see Dr. Candise Che on 6/11 at 1pm. She will notify the pt. Letter mailed.

## 2016-12-12 DIAGNOSIS — S4990XA Unspecified injury of shoulder and upper arm, unspecified arm, initial encounter: Secondary | ICD-10-CM | POA: Diagnosis not present

## 2016-12-26 ENCOUNTER — Telehealth: Payer: Self-pay | Admitting: Hematology

## 2016-12-26 NOTE — Telephone Encounter (Signed)
[name] has cld to cancel the app scheduled for 6/11.

## 2016-12-27 DIAGNOSIS — S46811A Strain of other muscles, fascia and tendons at shoulder and upper arm level, right arm, initial encounter: Secondary | ICD-10-CM | POA: Diagnosis not present

## 2017-01-01 ENCOUNTER — Encounter: Payer: BLUE CROSS/BLUE SHIELD | Admitting: Hematology

## 2017-01-30 ENCOUNTER — Encounter: Payer: Self-pay | Admitting: Oncology

## 2017-01-30 ENCOUNTER — Ambulatory Visit (INDEPENDENT_AMBULATORY_CARE_PROVIDER_SITE_OTHER): Payer: BLUE CROSS/BLUE SHIELD | Admitting: Oncology

## 2017-01-30 VITALS — BP 137/81 | HR 72 | Temp 98.1°F | Ht 66.0 in | Wt 229.8 lb

## 2017-01-30 DIAGNOSIS — Z791 Long term (current) use of non-steroidal anti-inflammatories (NSAID): Secondary | ICD-10-CM

## 2017-01-30 DIAGNOSIS — Z87891 Personal history of nicotine dependence: Secondary | ICD-10-CM | POA: Diagnosis not present

## 2017-01-30 DIAGNOSIS — Z7901 Long term (current) use of anticoagulants: Secondary | ICD-10-CM

## 2017-01-30 DIAGNOSIS — D509 Iron deficiency anemia, unspecified: Secondary | ICD-10-CM

## 2017-01-30 DIAGNOSIS — Z9884 Bariatric surgery status: Secondary | ICD-10-CM | POA: Diagnosis not present

## 2017-01-30 DIAGNOSIS — K909 Intestinal malabsorption, unspecified: Secondary | ICD-10-CM | POA: Diagnosis not present

## 2017-01-30 DIAGNOSIS — D508 Other iron deficiency anemias: Secondary | ICD-10-CM | POA: Diagnosis not present

## 2017-01-30 DIAGNOSIS — Z980 Intestinal bypass and anastomosis status: Secondary | ICD-10-CM

## 2017-01-30 DIAGNOSIS — Z79899 Other long term (current) drug therapy: Secondary | ICD-10-CM

## 2017-01-30 HISTORY — DX: Bariatric surgery status: Z98.84

## 2017-01-30 HISTORY — DX: Intestinal malabsorption, unspecified: K90.9

## 2017-01-30 HISTORY — DX: Intestinal bypass and anastomosis status: Z98.0

## 2017-01-30 HISTORY — DX: Iron deficiency anemia, unspecified: D50.9

## 2017-01-30 NOTE — Progress Notes (Signed)
New Patient Hematology   Tyrone [name] [id number] 01-Mar-1966 51 y.o. 01/30/2017  CC: Dr. Shirlean Mylar; Dr. 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 Clent Ridges at [facility] for weight loss control. Preoperative weight 450 pounds. In December 2017 hemoglobin fell to 9.6 with MCV 73. Previous values recorded in our system back as far as July 2013 showed hemoglobin at that time 13 g with MCV 93.5. Hemoglobin remained in the 11-12 gram range until November 2017. He had recently experienced an episode of profuse epistaxis. He had an unintentional approximate 60 pound weight loss. He was on anticoagulation at that time for chronic atrial fibrillation. Ear nose and throat evaluation showed no significant pathology. He was found to have a septal granuloma which was cauterized. He subsequently underwent upper endoscopy and colonoscopy on 07/07/2016. Upper endoscopy showed mild distal esophagitis. The Roux-en-Y anastomosis appeared normal. No ulcer. Colonoscopy was totally normal. He was put on Prilosec and his heartburn symptoms resolved. At time of recent follow-up visit with his primary care physician on May 23,2018,  ferritin markedly decreased at 9 despite oral iron replacement. I do not see a CBC recorded. B12 level was greater than 1500 on oral B12. BUN 9. Creatinine 0.8. He has had post prandial loose bowel movements up to 6 times daily since his bypass surgery. He notes no melena or hematochezia. He attributes weight loss over the last year to decreasing his caloric intake and drinking a gallon of water a day. Appetite is good.  PMH: Past Medical History:  Diagnosis Date  . Anemia, iron deficiency 01/30/2017  . Cervicalgia   . CVA (cerebral infarction) 05/01/12   on pradaxa at time of stroke, switched to xarelto  . External thrombosed hemorrhoids   .  GERD (gastroesophageal reflux disease)   . Guillain Barr syndrome (HCC) 1998     following seasonal influenza vaccination  . Guillain-Barre syndrome (HCC)     from stroke in 2013  . Hematuria    a. 01/2012 - to f/u with urology as outpt.  Marland Kitchen Hernia of unspecified site of abdominal cavity without mention of obstruction or gangrene    of abdominal  . Hx of gastric bypass 01/30/2017   2004 Dr Clent Ridges [facility] weight 450 lbs  . Hypertension   . Iron malabsorption 01/30/2017   Hx gastric bypass 2004  . Morbid obesity s/p gastric bypass   . Nonischemic cardiomyopathy (HCC)    a. 01/2012 Echo: EF 35-40%, Diff HK, mild MR;  b. 01/2012 R&L Heart Cath: mildly elevated R heart pressures (pcwp 17) , nl cors.  . Osteoarthrosis, unspecified whether generalized or localized, ankle and foot    of the knees,ankle and foot  . Pancytopenia   . Persistent atrial fibrillation (HCC)    a. Pradaxa started 01/2012;  b .s/p TEE/DCCV 02/16/2012; c. recurrent afib->Tikosyn Initiation 03/2012  . Sleep apnea    CPAP compliant  . Status post bypass gastrojejunostomy 01/30/2017  . Unspecified intestinal malabsorption   . Urticaria, unspecified   . Vitamin B 12 deficiency   Radiofrequency ablation to control atrial fibrillation December 2013. Right CVA with mild left-sided weakness following DC cardioversion in 2013. Essential tremor. No history of MI, ulcers, emphysema, asthma, tuberculosis, hepatitis, yellow jaundice, malaria, mononucleosis, thyroid disease, inflammatory arthritis, diabetes, or prostate problems.   Past Surgical History:  Procedure Laterality Date  . ATRIAL  FIBRILLATION ABLATION N/A 06/25/2012   PVI by Dr [name]  . CARDIOVERSION  02/16/2012   Procedure: CARDIOVERSION;  Surgeon: [name], MD;  Location: [facility] ENDOSCOPY;  Service: Cardiovascular;  Laterality: N/A;  . CARDIOVERSION  04/22/2012   Procedure: CARDIOVERSION;  Surgeon: [name], MD;  Location: [facility] ENDOSCOPY;  Service:  Cardiovascular;  Laterality: N/A;  . COLONOSCOPY N/A 07/07/2016   Procedure: COLONOSCOPY;  Surgeon: Jeani Hawking, MD;  Location: [facility] ENDOSCOPY;  Service: Endoscopy;  Laterality: N/A;  . ESOPHAGOGASTRODUODENOSCOPY N/A 07/07/2016   Procedure: ESOPHAGOGASTRODUODENOSCOPY (EGD);  Surgeon: Jeani Hawking, MD;  Location: [facility] ENDOSCOPY;  Service: Endoscopy;  Laterality: N/A;  . GASTRIC BYPASS  2004  . HERNIA REPAIR    . LEFT AND RIGHT HEART CATHETERIZATION WITH CORONARY ANGIOGRAM N/A 02/13/2012   Procedure: LEFT AND RIGHT HEART CATHETERIZATION WITH CORONARY ANGIOGRAM;  Surgeon: Tonny Bollman, MD;  Location: [facility] CATH LAB;  Service: Cardiovascular;  Laterality: N/A;  . TEE WITHOUT CARDIOVERSION  02/16/2012   Procedure: TRANSESOPHAGEAL ECHOCARDIOGRAM (TEE);  Surgeon: [name], MD;  Location: [facility] ENDOSCOPY;  Service: Cardiovascular;  Laterality: N/A;  10a CV  . TEE WITHOUT CARDIOVERSION  06/24/2012   Procedure: TRANSESOPHAGEAL ECHOCARDIOGRAM (TEE);  Surgeon: [name], MD;  Location: [facility] ENDOSCOPY;  Service: Cardiovascular;  Laterality: N/A;  . UMBILICAL HERNIA REPAIR     x 2    Allergies: No Known Allergies  Medications: Current Outpatient Prescriptions:  .  amLODipine (NORVASC) 5 MG tablet, TAKE ONE TABLET BY MOUTH ONCE DAILY, Disp: 30 tablet, Rfl: 1 .  Cyanocobalamin (VITAMIN B-12) 5000 MCG TBDP, Take 5,000 mcg by mouth daily., Disp: , Rfl:  .  furosemide (LASIX) 40 MG tablet, Take 20 mg by mouth daily., Disp: , Rfl:  .  gabapentin (NEURONTIN) 300 MG capsule, Take 1 capsule (300 mg total) by mouth 3 (three) times daily., Disp: 270 capsule, Rfl: 4 .  ibuprofen (ADVIL,MOTRIN) 200 MG tablet, Take 400 mg by mouth every 8 (eight) hours as needed (for pain.)., Disp: , Rfl:  .  losartan (COZAAR) 100 MG tablet, Take 0.5 tablets (50 mg total) by mouth 2 (two) times daily., Disp: 90 tablet, Rfl: 3 .  Methylcellulose, Laxative, (CITRUCEL) 500 MG TABS, Take 500 mg by mouth daily., Disp: , Rfl:  .   Multiple Vitamin (MULTIVITAMIN) tablet, Take 1-2 tablets by mouth 2 (two) times daily. Take 2 tablets in the morning and 1 tablet in the evening, Disp: , Rfl:  .  omeprazole (PRILOSEC) 40 MG capsule, , Disp: , Rfl: 1 .  sodium chloride (OCEAN) 0.65 % SOLN nasal spray, Place 1-2 sprays into both nostrils 4 (four) times daily as needed (for nasal irritation/nose bleeds.)., Disp: , Rfl:  .  spironolactone (ALDACTONE) 50 MG tablet, TAKE ONE TABLET BY MOUTH ONCE DAILY, Disp: 30 tablet, Rfl: 1 .  vitamin E (VITAMIN E) 400 UNIT capsule, Take 400 Units by mouth daily., Disp: , Rfl:  .  XARELTO 20 MG TABS tablet, TAKE ONE TABLET BY MOUTH DAILY WITH  SUPPER, Disp: 30 tablet, Rfl: 1 No current facility-administered medications for this visit.   Facility-Administered Medications Ordered in Other Visits:  .  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 Married. One son age 37 who is 6 feet 3 just always 350 pounds.  reports that he has never smoked. He quit smokeless tobacco use about 14 years ago. His smokeless tobacco  use included Chew. He reports that he drinks alcohol: Occasional margarita. He reports that he does not use drugs.  Family History: Family History  Problem Relation Age of Onset  . Hypertension Mother   . Hypertension Father   . Heart attack Father 71       Died  . Diabetes Father   . Kidney disease Father        with transplant  . Stroke Paternal Grandmother   Father had diabetes, end-stage renal disease due to glomerulonephritis, status post renal transplant, had a massive MI at age 30. Patient tried to resuscitate him in their home. Mother still alive and well at age 73. He has a sister who is an Charity fundraiser age 65. She has diabetes.  Review of Systems: See HPI  He denies any paresthesias. Currently struggling with progressive resting tremor. Recently saw  neurology. MRI fibber 21st 2018 showed a small area of acute infarction in the right parietal cortex. Changes from the previous infarct. Remaining ROS negative.  Physical Exam: Blood pressure 137/81, pulse 72, temperature 98.1 F (36.7 C), temperature source Oral, height 5\' 6"  (1.676 m), weight 229 lb 12.8 oz (104.2 kg), SpO2 99 %. Wt Readings from Last 3 Encounters:  01/30/17 229 lb 12.8 oz (104.2 kg)  09/13/16 241 lb 12.8 oz (109.7 kg)  07/07/16 239 lb (108.4 kg)     General appearance: Well-nourished Caucasian man [name], exudate, mass, [name] Lymph nodes: No cervical, supraclavicular, or axillary lymphadenopathy Breasts:  Lungs: Clear to auscultation, resonant to percussion throughout Heart: Regular rhythm, no murmur, no gallop, no rub, no click, no edema Abdomen: Soft, nontender, normal bowel sounds, no mass, no organomegaly Extremities: No edema, no calf tenderness Musculoskeletal: no joint deformities GU:  Vascular: Carotid pulses 2+, no bruits,  Neurologic: Alert, oriented, PERRLA, optic discs sharp and vessels normal, no hemorrhage or exudate, cranial nerves grossly normal, motor strength 5 over 5, reflexes 1+ symmetric, upper body coordination normal, gait normal, migration sensation mildly decreased over the fingertips by tuning fork exam Skin: No rash or ecchymosis    Lab Results: Lab Results  Component Value Date   WBC 5.4 06/05/2016   HGB 9.6 (A) 06/05/2016   HCT 28.4 (A) 06/05/2016   MCV 73.4 (A) 06/05/2016   PLT 191 10/29/2014     Chemistry      Component Value Date/Time   NA 136 04/12/2016 1441   NA 141 07/05/2015 0923   K 5.1 04/12/2016 1441   CL 104 04/12/2016 1441   CO2 23 04/12/2016 1441   BUN 6 (L) 04/12/2016 1441   BUN 9 07/05/2015 0923   CREATININE 0.80 04/12/2016 1441      Component Value Date/Time   CALCIUM 8.4 (L) 04/12/2016 1441   ALKPHOS 74 04/12/2016 1441   AST 10 04/12/2016 1441    ALT 8 (L) 04/12/2016 1441   BILITOT 0.3 04/12/2016 1441   BILITOT 0.4 07/05/2015 0923       Impression: Iron malabsorption which has developed slowly over the 14 years since gastric bypass surgery   Recommendation: We discussed a trial of parenteral iron. I have scheduled him for Feraheme 510 mg IV weekly 2. We discussed potential side effects. Minor reactions including pruritus, flushing, and urticaria. Rare anaphylactic reactions. I will check a CBC and ferritin 2 months after his infusion. It usually takes that long to see a full response. I will check lab again in 6 months with a follow-up  visit.    Cephas Darby, MD, FACP  Hematology-Oncology/Internal Medicine  01/30/2017, 2:34 PM

## 2017-01-30 NOTE — Progress Notes (Signed)
Appt schedule for feraheme next Tuesday @ 0900 AM at [facility]. Pt/wife informed to register at Admissions about 15 mins prior to appt.

## 2017-01-30 NOTE — Patient Instructions (Addendum)
To lab today Schedule IV iron, feraheme  Weekly x 2 start 7/17 at [facility] short stay unit Follow up lab in 2 months then lab and visit in 6 months

## 2017-01-31 LAB — CBC WITH DIFFERENTIAL/PLATELET
Basophils Absolute: 0.1 10*3/uL (ref 0.0–0.2)
Basos: 1 %
EOS (ABSOLUTE): 0.1 10*3/uL (ref 0.0–0.4)
Eos: 2 %
Hematocrit: 30.7 % — ABNORMAL LOW (ref 37.5–51.0)
Hemoglobin: 9.5 g/dL — ABNORMAL LOW (ref 13.0–17.7)
Immature Grans (Abs): 0 10*3/uL (ref 0.0–0.1)
Immature Granulocytes: 0 %
Lymphocytes Absolute: 1.2 10*3/uL (ref 0.7–3.1)
Lymphs: 32 %
MCH: 24.1 pg — ABNORMAL LOW (ref 26.6–33.0)
MCHC: 30.9 g/dL — ABNORMAL LOW (ref 31.5–35.7)
MCV: 78 fL — ABNORMAL LOW (ref 79–97)
Monocytes Absolute: 0.5 10*3/uL (ref 0.1–0.9)
Monocytes: 13 %
Neutrophils Absolute: 1.8 10*3/uL (ref 1.4–7.0)
Neutrophils: 52 %
Platelets: 197 10*3/uL (ref 150–379)
RBC: 3.94 x10E6/uL — ABNORMAL LOW (ref 4.14–5.80)
RDW: 18.3 % — ABNORMAL HIGH (ref 12.3–15.4)
WBC: 3.6 10*3/uL (ref 3.4–10.8)

## 2017-01-31 LAB — IRON AND TIBC
Iron Saturation: 7 % — CL (ref 15–55)
Iron: 23 ug/dL — ABNORMAL LOW (ref 38–169)
Total Iron Binding Capacity: 347 ug/dL (ref 250–450)
UIBC: 324 ug/dL (ref 111–343)

## 2017-01-31 LAB — FERRITIN: Ferritin: 18 ng/mL — ABNORMAL LOW (ref 30–400)

## 2017-02-06 ENCOUNTER — Encounter (HOSPITAL_COMMUNITY)
Admission: RE | Admit: 2017-02-06 | Discharge: 2017-02-06 | Disposition: A | Discharge status: Home / Self Care | Payer: BLUE CROSS/BLUE SHIELD | Source: Ambulatory Visit | Attending: Oncology | Admitting: Oncology

## 2017-02-06 DIAGNOSIS — K909 Intestinal malabsorption, unspecified: Secondary | ICD-10-CM

## 2017-02-06 DIAGNOSIS — D508 Other iron deficiency anemias: Secondary | ICD-10-CM

## 2017-02-06 DIAGNOSIS — Z980 Intestinal bypass and anastomosis status: Secondary | ICD-10-CM

## 2017-02-06 DIAGNOSIS — Z9884 Bariatric surgery status: Secondary | ICD-10-CM

## 2017-02-06 MED ORDER — SODIUM CHLORIDE 0.9 % IV SOLN
510.0000 mg | INTRAVENOUS | Status: DC
  Administered 2017-02-06: 510 mg via INTRAVENOUS
  Filled 2017-02-06: qty 17

## 2017-02-06 NOTE — Discharge Instructions (Signed)

## 2017-02-20 ENCOUNTER — Encounter (HOSPITAL_COMMUNITY)
Admission: RE | Admit: 2017-02-20 | Discharge: 2017-02-20 | Disposition: A | Discharge status: Home / Self Care | Payer: BLUE CROSS/BLUE SHIELD | Source: Ambulatory Visit | Attending: Oncology | Admitting: Oncology

## 2017-02-20 DIAGNOSIS — Z980 Intestinal bypass and anastomosis status: Secondary | ICD-10-CM

## 2017-02-20 DIAGNOSIS — D508 Other iron deficiency anemias: Secondary | ICD-10-CM

## 2017-02-20 DIAGNOSIS — Z9884 Bariatric surgery status: Secondary | ICD-10-CM

## 2017-02-20 DIAGNOSIS — K909 Intestinal malabsorption, unspecified: Secondary | ICD-10-CM | POA: Diagnosis not present

## 2017-02-20 MED ORDER — SODIUM CHLORIDE 0.9 % IV SOLN
510.0000 mg | INTRAVENOUS | Status: AC
  Administered 2017-02-20: 510 mg via INTRAVENOUS
  Filled 2017-02-20: qty 17

## 2017-03-20 ENCOUNTER — Ambulatory Visit: Payer: BLUE CROSS/BLUE SHIELD | Admitting: Neurology

## 2017-04-04 ENCOUNTER — Other Ambulatory Visit: Payer: BLUE CROSS/BLUE SHIELD

## 2017-04-05 ENCOUNTER — Other Ambulatory Visit (INDEPENDENT_AMBULATORY_CARE_PROVIDER_SITE_OTHER): Payer: BLUE CROSS/BLUE SHIELD

## 2017-04-05 DIAGNOSIS — Z9884 Bariatric surgery status: Secondary | ICD-10-CM | POA: Diagnosis not present

## 2017-04-05 DIAGNOSIS — K909 Intestinal malabsorption, unspecified: Secondary | ICD-10-CM | POA: Diagnosis not present

## 2017-04-05 DIAGNOSIS — Z980 Intestinal bypass and anastomosis status: Secondary | ICD-10-CM

## 2017-04-05 DIAGNOSIS — D508 Other iron deficiency anemias: Secondary | ICD-10-CM | POA: Diagnosis not present

## 2017-04-05 DIAGNOSIS — K9089 Other intestinal malabsorption: Secondary | ICD-10-CM

## 2017-04-06 ENCOUNTER — Other Ambulatory Visit: Payer: Self-pay | Admitting: Oncology

## 2017-04-06 DIAGNOSIS — K909 Intestinal malabsorption, unspecified: Secondary | ICD-10-CM

## 2017-04-06 DIAGNOSIS — D508 Other iron deficiency anemias: Secondary | ICD-10-CM

## 2017-04-06 LAB — CBC WITH DIFFERENTIAL/PLATELET
Basophils Absolute: 0.1 10*3/uL (ref 0.0–0.2)
Basos: 1 %
EOS (ABSOLUTE): 0.2 10*3/uL (ref 0.0–0.4)
Eos: 3 %
Hematocrit: 37.3 % — ABNORMAL LOW (ref 37.5–51.0)
Hemoglobin: 12.2 g/dL — ABNORMAL LOW (ref 13.0–17.7)
Immature Grans (Abs): 0 10*3/uL (ref 0.0–0.1)
Immature Granulocytes: 0 %
Lymphocytes Absolute: 1.5 10*3/uL (ref 0.7–3.1)
Lymphs: 29 %
MCH: 30.2 pg (ref 26.6–33.0)
MCHC: 32.7 g/dL (ref 31.5–35.7)
MCV: 92 fL (ref 79–97)
Monocytes Absolute: 0.4 10*3/uL (ref 0.1–0.9)
Monocytes: 8 %
Neutrophils Absolute: 2.9 10*3/uL (ref 1.4–7.0)
Neutrophils: 59 %
Platelets: 165 10*3/uL (ref 150–379)
RBC: 4.04 x10E6/uL — ABNORMAL LOW (ref 4.14–5.80)
RDW: 23 % — ABNORMAL HIGH (ref 12.3–15.4)
WBC: 5.1 10*3/uL (ref 3.4–10.8)

## 2017-04-06 LAB — FERRITIN: Ferritin: 51 ng/mL (ref 30–400)

## 2017-04-09 ENCOUNTER — Telehealth: Payer: Self-pay | Admitting: *Deleted

## 2017-04-09 NOTE — Telephone Encounter (Signed)
Pt called / informed " nice response to iron. Hb up from 9.5 to 12.2" per Dr Cyndie Chime. Lab appt scheduled for Nov 13 @ 0900 AM.

## 2017-04-09 NOTE — Telephone Encounter (Signed)
-----   Message from Levert Feinstein, MD sent at 04/06/2017  4:23 PM EDT ----- Call pt: nice response to iron. Hb up from 9.5 to 12.2. Let's check bloodwork again in November - order entered for 11/12

## 2017-06-04 ENCOUNTER — Other Ambulatory Visit: Payer: BLUE CROSS/BLUE SHIELD

## 2017-06-05 ENCOUNTER — Other Ambulatory Visit (INDEPENDENT_AMBULATORY_CARE_PROVIDER_SITE_OTHER): Payer: BLUE CROSS/BLUE SHIELD

## 2017-06-05 ENCOUNTER — Other Ambulatory Visit (HOSPITAL_COMMUNITY): Admission: RE | Admit: 2017-06-05 | Payer: BLUE CROSS/BLUE SHIELD | Source: Ambulatory Visit | Admitting: Oncology

## 2017-06-05 DIAGNOSIS — D508 Other iron deficiency anemias: Secondary | ICD-10-CM | POA: Diagnosis not present

## 2017-06-05 DIAGNOSIS — K909 Intestinal malabsorption, unspecified: Secondary | ICD-10-CM

## 2017-06-06 LAB — CBC WITH DIFFERENTIAL/PLATELET
Basophils Absolute: 0.1 10*3/uL (ref 0.0–0.2)
Basos: 1 %
EOS (ABSOLUTE): 0.1 10*3/uL (ref 0.0–0.4)
Eos: 3 %
Hematocrit: 35.4 % — ABNORMAL LOW (ref 37.5–51.0)
Hemoglobin: 11.7 g/dL — ABNORMAL LOW (ref 13.0–17.7)
Immature Grans (Abs): 0 10*3/uL (ref 0.0–0.1)
Immature Granulocytes: 0 %
Lymphocytes Absolute: 1.1 10*3/uL (ref 0.7–3.1)
Lymphs: 23 %
MCH: 33.2 pg — ABNORMAL HIGH (ref 26.6–33.0)
MCHC: 33.1 g/dL (ref 31.5–35.7)
MCV: 101 fL — ABNORMAL HIGH (ref 79–97)
Monocytes Absolute: 0.5 10*3/uL (ref 0.1–0.9)
Monocytes: 10 %
Neutrophils Absolute: 3.1 10*3/uL (ref 1.4–7.0)
Neutrophils: 63 %
Platelets: 172 10*3/uL (ref 150–379)
RBC: 3.52 x10E6/uL — ABNORMAL LOW (ref 4.14–5.80)
RDW: 14.5 % (ref 12.3–15.4)
WBC: 4.8 10*3/uL (ref 3.4–10.8)

## 2017-06-06 LAB — FERRITIN: Ferritin: 101 ng/mL (ref 30–400)

## 2017-06-08 ENCOUNTER — Telehealth: Payer: Self-pay | Admitting: *Deleted

## 2017-06-08 NOTE — Telephone Encounter (Signed)
Called pt - no answer; left message-  "hemoglobin about the same as it was before the IV iron at 11.7, was 12.2 - this is not significant. Red cells now large - suggests we corrected the iron deficiency but there may be some vitamin B12 deficiency but level was normal back in April.We will continue to monitor your blood counts over time. I believe he is due for lab in 4 months. " per Dr Cyndie Chime. And call for any questions. He has an appt to see Dr Reece Agar Jan 22. If he needs labs before the appt , let me know.

## 2017-06-08 NOTE — Telephone Encounter (Signed)
-----   Message from Levert Feinstein, MD sent at 06/06/2017 10:14 AM EST ----- Call pt: hemoglobin about the same as it was before the IV iron at 11.7, was 12.2 - this is not significant. Red cells now large - suggests we corrected the iron deficiency but there may be some vitamin B12 deficiency but level was normal back in April.We will continue to monitor your blood counts over time. I believe he is due for lab in 4 months.

## 2017-08-13 ENCOUNTER — Telehealth: Payer: Self-pay | Admitting: *Deleted

## 2017-08-13 DIAGNOSIS — R000 Tachycardia, unspecified: Secondary | ICD-10-CM | POA: Diagnosis not present

## 2017-08-13 DIAGNOSIS — I482 Chronic atrial fibrillation: Secondary | ICD-10-CM | POA: Diagnosis not present

## 2017-08-13 DIAGNOSIS — I10 Essential (primary) hypertension: Secondary | ICD-10-CM | POA: Diagnosis not present

## 2017-08-13 NOTE — Telephone Encounter (Signed)
Melissa will call patient and have him seen in the afib clinic tomorrow to be evaluated.  He only noticed as his HR was high on his watch.  Otherwice he is asymptomatic

## 2017-08-13 NOTE — Telephone Encounter (Signed)
Pt wife called concerned with pt elevated HR running between 110-140 today - says this just started today - denies any CP/dizziness/SOB/swelling - pt is concerned he is back in a-fib (hasn't been seen since 05/2015) wanted to make appt but next available is 2/13 at [facility] w/Dr Allred and 1/31 with extender in [place] - pt doesn't think he need to wait that long before being seen. Will forward to [facility] triage

## 2017-08-14 ENCOUNTER — Other Ambulatory Visit: Payer: Self-pay

## 2017-08-14 ENCOUNTER — Encounter: Payer: Self-pay | Admitting: Oncology

## 2017-08-14 ENCOUNTER — Ambulatory Visit (HOSPITAL_COMMUNITY)
Admission: RE | Admit: 2017-08-14 | Discharge: 2017-08-14 | Disposition: A | Discharge status: Home / Self Care | Payer: BLUE CROSS/BLUE SHIELD | Source: Ambulatory Visit | Attending: Nurse Practitioner | Admitting: Nurse Practitioner

## 2017-08-14 ENCOUNTER — Encounter (HOSPITAL_COMMUNITY): Payer: Self-pay | Admitting: Nurse Practitioner

## 2017-08-14 ENCOUNTER — Ambulatory Visit: Payer: BLUE CROSS/BLUE SHIELD | Admitting: Oncology

## 2017-08-14 VITALS — BP 132/90 | HR 59 | Temp 98.0°F | Ht 66.0 in | Wt 217.6 lb

## 2017-08-14 VITALS — BP 122/74 | HR 142 | Ht 66.0 in | Wt 216.0 lb

## 2017-08-14 DIAGNOSIS — Z934 Other artificial openings of gastrointestinal tract status: Secondary | ICD-10-CM

## 2017-08-14 DIAGNOSIS — K909 Intestinal malabsorption, unspecified: Secondary | ICD-10-CM | POA: Diagnosis not present

## 2017-08-14 DIAGNOSIS — D508 Other iron deficiency anemias: Secondary | ICD-10-CM

## 2017-08-14 DIAGNOSIS — I481 Persistent atrial fibrillation: Secondary | ICD-10-CM | POA: Diagnosis not present

## 2017-08-14 DIAGNOSIS — K9049 Malabsorption due to intolerance, not elsewhere classified: Secondary | ICD-10-CM

## 2017-08-14 DIAGNOSIS — I4819 Other persistent atrial fibrillation: Secondary | ICD-10-CM

## 2017-08-14 DIAGNOSIS — Z9889 Other specified postprocedural states: Secondary | ICD-10-CM | POA: Diagnosis not present

## 2017-08-14 DIAGNOSIS — Z9884 Bariatric surgery status: Secondary | ICD-10-CM

## 2017-08-14 DIAGNOSIS — Z8673 Personal history of transient ischemic attack (TIA), and cerebral infarction without residual deficits: Secondary | ICD-10-CM | POA: Insufficient documentation

## 2017-08-14 DIAGNOSIS — Z79899 Other long term (current) drug therapy: Secondary | ICD-10-CM | POA: Insufficient documentation

## 2017-08-14 DIAGNOSIS — Z7901 Long term (current) use of anticoagulants: Secondary | ICD-10-CM

## 2017-08-14 MED ORDER — METOPROLOL TARTRATE 50 MG PO TABS: 25.0000 mg | ORAL_TABLET | Freq: Two times a day (BID) | ORAL | 6 refills | Status: DC

## 2017-08-14 NOTE — Patient Instructions (Addendum)
Your physician has recommended you make the following change in your medication:  1)Start metoprolol 1/2 tablet twice a day   Cardioversion scheduled for Tuesday, January 29th  - Arrive at the [facility] and go to admitting at 10:30AM  -Do not eat or drink anything after midnight the night prior to your procedure.  - Take all your medication with a sip of water prior to arrival.  - You will not be able to drive home after your procedure.  Follow up with Dr. [name] in 1 month

## 2017-08-14 NOTE — Patient Instructions (Signed)
To lab toady Return visit in 6 months. Lab 1 week before visit

## 2017-08-14 NOTE — Progress Notes (Signed)
Hematology and Oncology Follow Up Visit  CAID RADIN [id number] 12-08-1965 52 y.o. 08/14/2017 9:48 AM   Principle Diagnosis: Encounter Diagnoses  Name Primary?  . Persistent atrial fibrillation ([place])   . Iron malabsorption Yes  . Other iron deficiency anemia   . History of Roux-en-Y gastric bypass   . 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 Volanda Napoleon at [facility] for weight loss control. Preoperative weight 450 pounds. In December 2017 hemoglobin fell to 9.6 with MCV 73. Previous values recorded in our system back as far as July 2013 showed hemoglobin at that time 13 g with MCV 93.5. Hemoglobin remained in the 11-12 gram range until November 2017. He had recently experienced an episode of profuse epistaxis. He had an unintentional approximate 60 pound weight loss. He was on anticoagulation at that time for chronic atrial fibrillation. Ear nose and throat evaluation showed no significant pathology. He was found to have a septal granuloma which was cauterized. He subsequently underwent upper endoscopy and colonoscopy on 07/07/2016. Upper endoscopy showed mild distal esophagitis. The Roux-en-Y anastomosis appeared normal. No ulcer. Colonoscopy was totally normal. He was put on Prilosec and his heartburn symptoms resolved. At time of recent follow-up visit with his primary care physician on May 23,2018,  ferritin markedly decreased at 9 despite oral iron replacement. B12 level was greater than 1500 on oral B12. BUN 9. Creatinine 0.8. He has had post prandial loose bowel movements up to 6 times daily since his bypass surgery. He notes no melena or hematochezia. He attributes weight loss over the last year to decreasing his caloric intake and drinking a gallon of water a day. Appetite is good.    Interim History: Since his first visit with me  in July 2018, he received Feraheme 510 mg IV x2 on July 17 and February 20, 2017.  Hemoglobin rose from 9.5 to 12 g by September 13 and was 11.7 g on November 13.  MCV up from 78 to 101.  He tolerated the IV iron without any side effects.  Main side effect was financial.  His co-pay was $3300! He is wearing a cardiac wrist watch which just alerted him within the last few days that he is back in atrial fibrillation.  He is already on chronic anticoagulation with Xarelto now 5 years status post a radiofrequency ablation procedure.  He has no sensation of a rapid or irregular heartbeat.  He is scheduled to see his cardiologist today for further advice. No other interim medical problems.  Wife accompanies him today.  He is maintaining his weight in fact he has lost a few pounds since last visit which she is proud of.  Medications: reviewed  Allergies: No Known Allergies  Review of Systems: See interim history Remaining ROS negative:   Physical Exam: Blood pressure 132/90, pulse (!) 59, temperature 98 F (36.7 C), temperature source Oral, height _0  (1.676 m), weight 217 lb 9.6 oz (98.7 kg), SpO2 98 %. Wt Readings from Last 3 Encounters:  08/14/17 217 lb 9.6 oz (98.7 kg)  02/20/17 229 lb (103.9 kg)  02/06/17 229 lb (103.9 kg)     General appearance: Well-nourished Caucasian man [name], exudate, mass, [name] Lymph nodes: No cervical, supraclavicular, or axillary lymphadenopathy Breasts: No abnormal skin changes, no dominant mass in either breast Lungs: Clear to auscultation, resonant to  percussion throughout Heart: Rapid irregular rhythm, no obvious murmur, no gallop, no rub, no click, no edema Abdomen: Soft, nontender, normal bowel sounds, no mass, no organomegaly Extremities: No edema, no calf tenderness Musculoskeletal: no joint deformities GU:  Vascular: Carotid pulses 2+, no bruits, distal pulses:  Neurologic: Alert, oriented,  PERRLA, cranial nerves grossly normal, motor strength 5 over 5, reflexes 1+ symmetric,  Skin: No rash or ecchymosis  Lab Results: CBC W/Diff    Component Value Date/Time   WBC 4.8 06/05/2017 0952   WBC 5.4 06/05/2016 1613   WBC 3.9 (L) 10/29/2014 0911   RBC 3.52 (L) 06/05/2017 0952   RBC 3.87 (A) 06/05/2016 1613   RBC 3.93 (L) 10/29/2014 0911   HGB 11.7 (L) 06/05/2017 0952   HCT 35.4 (L) 06/05/2017 0952   PLT 172 06/05/2017 0952   MCV 101 (H) 06/05/2017 0952   MCH 33.2 (H) 06/05/2017 0952   MCH 24.9 (A) 06/05/2016 1613   MCH 28.2 10/29/2014 0911   MCHC 33.1 06/05/2017 0952   MCHC 33.9 06/05/2016 1613   MCHC 32.6 10/29/2014 0911   RDW 14.5 06/05/2017 0952   LYMPHSABS 1.1 06/05/2017 0952   MONOABS 0.2 06/18/2012 0911   EOSABS 0.1 06/05/2017 0952   BASOSABS 0.1 06/05/2017 0952     Chemistry      Component Value Date/Time   NA 136 04/12/2016 1441   NA 141 07/05/2015 0923   K 5.1 04/12/2016 1441   CL 104 04/12/2016 1441   CO2 23 04/12/2016 1441   BUN 6 (L) 04/12/2016 1441   BUN 9 07/05/2015 0923   CREATININE 0.80 04/12/2016 1441      Component Value Date/Time   CALCIUM 8.4 (L) 04/12/2016 1441   ALKPHOS 74 04/12/2016 1441   AST 10 04/12/2016 1441   ALT 8 (L) 04/12/2016 1441   BILITOT 0.3 04/12/2016 1441   BILITOT 0.4 07/05/2015 2778       Radiological Studies: No results found.  Impression:  1.  15 years status post Roux-en-Y gastric bypass for morbid obesity  2.  Indolent development of iron malabsorption secondary to #1. Nice response to parenteral iron but hemoglobin did not normalize at 4 months and indices now macrocytic.  Suspect malabsorption of other vitamins. I will repeat a CBC and ferritin today.  I will also check E42 and folic acid.  He continues on on oral B12, vitamin D, and a additional multivitamin supplement.  3.  Recurrent atrial fibrillation For cardiology evaluation later today.  Since I am checking hematology labs I will add a C met, TSH,  and a magnesium which should be available later today to his cardiologist for review.   CC: Patient Care Team: Harrison Mons, PA-C as PCP - General (Family Medicine) Juanita Craver, MD as Consulting Physician (Gastroenterology)   Murriel Hopper, MD, [place]  Hematology-Oncology/Internal Medicine     1/22/20199:48 AM

## 2017-08-14 NOTE — Progress Notes (Addendum)
Primary Care Physician: Porfirio Oar, PA-C Referring Physician:Dr. Marney Doctor is a 52 y.o. male with a h/o CVA, gastric bypass,  afib ablation in 2013. He had been doing well for some time when a person in his office was discussing how heart rate can be tracked on apple watch. He checked his heart rate trends  and found to be in afib with RVR since 1/11. GHe was asymptomatic.  He is being seen in afib clinic as an urgent work in for management of afib. No known triggers other than stress related to work often keeps him up at night. He has RVR in the 140;'s in the office, again does not feel the irregular heart beat. He has continued to work hard to keep his weight down since the gastric bypass. Was on BB at one time but eventually stopped 2/2 brady after ablation/SR. He saw his PCP this am and TSH, cmet, mag and cbc pending.  Today, he denies symptoms of palpitations, chest pain, shortness of breath, orthopnea, PND, lower extremity edema, dizziness, presyncope, syncope, or neurologic sequela. The patient is tolerating medications without difficulties and is otherwise without complaint today.   Past Medical History:  Diagnosis Date  . Anemia, iron deficiency 01/30/2017  . Cervicalgia   . CVA (cerebral infarction) 05/01/12   on pradaxa at time of stroke, switched to xarelto  . External thrombosed hemorrhoids   . GERD (gastroesophageal reflux disease)   . Guillain Barr syndrome [facility])    following seasonal influenza vaccination  . Guillain-Barre syndrome (HCC)    from stroke in 2013  . Hematuria    a. 01/2012 - to f/u with urology as outpt.  Marland Kitchen Hernia of unspecified site of abdominal cavity without mention of obstruction or gangrene    of abdominal  . Hx of gastric bypass 01/30/2017   2004 Dr Clent Ridges [facility] weight 450 lbs  . Hypertension   . Iron malabsorption 01/30/2017   Hx gastric bypass 2004  . Morbid obesity s/p gastric bypass   . Nonischemic cardiomyopathy  (HCC)    a. 01/2012 Echo: EF 35-40%, Diff HK, mild MR;  b. 01/2012 R&L Heart Cath: mildly elevated R heart pressures (pcwp 17) , nl cors.  . Osteoarthrosis, unspecified whether generalized or localized, ankle and foot    of the knees,ankle and foot  . Pancytopenia   . Persistent atrial fibrillation (HCC)    a. Pradaxa started 01/2012;  b .s/p TEE/DCCV 02/16/2012; c. recurrent afib->Tikosyn Initiation 03/2012  . Sleep apnea    CPAP compliant  . Status post bypass gastrojejunostomy 01/30/2017  . Unspecified intestinal malabsorption   . Urticaria, unspecified   . Vitamin B 12 deficiency    Past Surgical History:  Procedure Laterality Date  . ATRIAL FIBRILLATION ABLATION N/A 06/25/2012   PVI by Dr [name]  . CARDIOVERSION  02/16/2012   Procedure: CARDIOVERSION;  Surgeon: [name], MD;  Location: [facility] ENDOSCOPY;  Service: Cardiovascular;  Laterality: N/A;  . CARDIOVERSION  04/22/2012   Procedure: CARDIOVERSION;  Surgeon: [name], MD;  Location: [facility] ENDOSCOPY;  Service: Cardiovascular;  Laterality: N/A;  . COLONOSCOPY N/A 07/07/2016   Procedure: COLONOSCOPY;  Surgeon: Jeani Hawking, MD;  Location: [facility] ENDOSCOPY;  Service: Endoscopy;  Laterality: N/A;  . ESOPHAGOGASTRODUODENOSCOPY N/A 07/07/2016   Procedure: ESOPHAGOGASTRODUODENOSCOPY (EGD);  Surgeon: Jeani Hawking, MD;  Location: [facility] ENDOSCOPY;  Service: Endoscopy;  Laterality: N/A;  . GASTRIC BYPASS  2004  . HERNIA REPAIR    .  LEFT AND RIGHT HEART CATHETERIZATION WITH CORONARY ANGIOGRAM N/A 02/13/2012   Procedure: LEFT AND RIGHT HEART CATHETERIZATION WITH CORONARY ANGIOGRAM;  Surgeon: Tonny Bollman, MD;  Location: [facility] CATH LAB;  Service: Cardiovascular;  Laterality: N/A;  . TEE WITHOUT CARDIOVERSION  02/16/2012   Procedure: TRANSESOPHAGEAL ECHOCARDIOGRAM (TEE);  Surgeon: [name], MD;  Location: [facility] ENDOSCOPY;  Service: Cardiovascular;  Laterality: N/A;  10a CV  . TEE WITHOUT CARDIOVERSION  06/24/2012   Procedure: TRANSESOPHAGEAL  ECHOCARDIOGRAM (TEE);  Surgeon: [name], MD;  Location: [facility] ENDOSCOPY;  Service: Cardiovascular;  Laterality: N/A;  . UMBILICAL HERNIA REPAIR     x 2    Current Outpatient Medications  Medication Sig Dispense Refill  . amLODipine (NORVASC) 5 MG tablet TAKE ONE TABLET BY MOUTH ONCE DAILY 30 tablet 1  . Cyanocobalamin (VITAMIN B-12) 5000 MCG TBDP Take 5,000 mcg by mouth daily.    . furosemide (LASIX) 40 MG tablet Take 20 mg by mouth daily.    Marland Kitchen gabapentin (NEURONTIN) 300 MG capsule Take 1 capsule (300 mg total) by mouth 3 (three) times daily. 270 capsule 4  . ibuprofen (ADVIL,MOTRIN) 200 MG tablet Take 400 mg by mouth every 8 (eight) hours as needed (for pain.).    Marland Kitchen losartan (COZAAR) 100 MG tablet Take 0.5 tablets (50 mg total) by mouth 2 (two) times daily. 90 tablet 3  . Methylcellulose, Laxative, (CITRUCEL) 500 MG TABS Take 500 mg by mouth daily.    . Multiple Vitamin (MULTIVITAMIN) tablet Take 1-2 tablets by mouth 2 (two) times daily. Take 2 tablets in the morning and 1 tablet in the evening    . omeprazole (PRILOSEC) 40 MG capsule   1  . sodium chloride (OCEAN) 0.65 % SOLN nasal spray Place 1-2 sprays into both nostrils 4 (four) times daily as needed (for nasal irritation/nose bleeds.).    Marland Kitchen spironolactone (ALDACTONE) 50 MG tablet TAKE ONE TABLET BY MOUTH ONCE DAILY 30 tablet 1  . vitamin E (VITAMIN E) 400 UNIT capsule Take 400 Units by mouth daily.    [name] Hurl 20 MG TABS tablet TAKE ONE TABLET BY MOUTH DAILY WITH  SUPPER 30 tablet 1  . metoprolol tartrate (LOPRESSOR) 50 MG tablet Take 0.5 tablets (25 mg total) by mouth 2 (two) times daily. 30 tablet 6   No current facility-administered medications for this encounter.    Facility-Administered Medications Ordered in Other Encounters  Medication Dose Route Frequency Provider Last Rate Last Dose  . gadopentetate dimeglumine (MAGNEVIST) injection 20 mL  20 mL Intravenous Once PRN Anson Fret, MD        No Known  Allergies  Social History   Socioeconomic History  . Marital status: Married    Spouse name: Not on file  . Number of children: 1  . Years of education: Not on file  . Highest education level: Not on file  Social Needs  . Financial resource strain: Not on file  . Food insecurity - worry: Not on file  . Food insecurity - inability: Not on file  . Transportation needs - medical: Not on file  . Transportation needs - non-medical: Not on file  Occupational History  . Occupation: Realtor  Tobacco Use  . Smoking status: Never Smoker  . Smokeless tobacco: Former Neurosurgeon    Types: Chew  Substance and Sexual Activity  . Alcohol use: Yes    Comment: occasional  . Drug use: No  . Sexual activity: Yes  Other Topics Concern  . Not on  file  Social History Narrative   Lives with wife.  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 Hypertension Mother   . Hypertension Father   . Heart attack Father 1       Died  . Diabetes Father   . Kidney disease Father        with transplant  . Stroke Paternal Grandmother     ROS- All systems are reviewed and negative except as per the HPI above  Physical Exam: Vitals:   08/14/17 1021  BP: 122/74  Pulse: (!) 142  Weight: 216 lb (98 kg)  Height: 5\' 6"  (1.676 m)   Wt Readings from Last 3 Encounters:  08/14/17 216 lb (98 kg)  08/14/17 217 lb 9.6 oz (98.7 kg)  02/20/17 229 lb (103.9 kg)    Labs: Lab Results  Component Value Date   NA 136 04/12/2016   K 5.1 04/12/2016   CL 104 04/12/2016   CO2 23 04/12/2016   GLUCOSE 88 04/12/2016   BUN 6 (L) 04/12/2016   CREATININE 0.80 04/12/2016   CALCIUM 8.4 (L) 04/12/2016   MG 1.9 04/08/2012   Lab Results  Component Value Date   INR 1.29 05/01/2012   Lab Results  Component Value Date   CHOL 165 06/15/2015   HDL 85 06/15/2015   LDLCALC 71 06/15/2015   TRIG 46 06/15/2015     GEN- The patient is well appearing, alert  and oriented x 3 today.   Head- normocephalic, atraumatic Eyes-  Sclera clear, conjunctiva pink Ears- hearing intact Oropharynx- clear Neck- supple, no JVP Lymph- no cervical lymphadenopathy Lungs- Clear to ausculation bilaterally, normal work of breathing Heart- irregular rate and rhythm, no murmurs, rubs or gallops, PMI not laterally displaced GI- soft, NT, ND, + BS Extremities- no clubbing, cyanosis, or edema MS- no significant deformity or atrophy Skin- no rash or lesion Psych- euthymic mood, full affect Neuro- strength and sensation are intact  EKG-afib at 142 bpm, qrs int 102 ms, qtc 489 ms Epic records reviewed    Assessment and Plan: 1. Persistent asymptomatic afib since 1/11 Discussed with Dr. [name] who casually came by the Afib clinic today when pt was here and spoke to pt He believes that cardioversion should be tried first as he has had a long time of abstinence  from afib since ablation If early return of afib,after cardioversion he will consider pt for repeat ablation Will add metoprolol 50 mg 1/2 tab bid for rate control, this may have to be held after return to SR depending on v rate Will plan to cardiovert pt next week If he should think that he goes back into SR ,after starting BB, call the office and we will confirm with EKG and cancel the parocedure  Continue xarelto 20 mg , states no missed doses x 3 weeks, and reminded not to miss doses Labs pending for cardioversion pending from PCP office this am  F/u with Dr. [name] in one month  Elvina Sidle. Matthew Fol[facility] [address] [place], [place] [address] [phone]

## 2017-08-15 ENCOUNTER — Other Ambulatory Visit: Payer: Self-pay | Admitting: Oncology

## 2017-08-15 ENCOUNTER — Ambulatory Visit: Payer: BLUE CROSS/BLUE SHIELD | Admitting: Nurse Practitioner

## 2017-08-15 LAB — FERRITIN: Ferritin: 65 ng/mL (ref 30–400)

## 2017-08-15 LAB — COMPREHENSIVE METABOLIC PANEL
ALT: 18 IU/L (ref 0–44)
AST: 25 IU/L (ref 0–40)
Albumin/Globulin Ratio: 1.7 (ref 1.2–2.2)
Albumin: 4 g/dL (ref 3.5–5.5)
Alkaline Phosphatase: 77 IU/L (ref 39–117)
BUN/Creatinine Ratio: 9 (ref 9–20)
BUN: 7 mg/dL (ref 6–24)
Bilirubin Total: 0.6 mg/dL (ref 0.0–1.2)
CO2: 21 mmol/L (ref 20–29)
Calcium: 8.4 mg/dL — ABNORMAL LOW (ref 8.7–10.2)
Chloride: 107 mmol/L — ABNORMAL HIGH (ref 96–106)
Creatinine, Ser: 0.77 mg/dL (ref 0.76–1.27)
GFR calc Af Amer: 121 mL/min/{1.73_m2} (ref >59)
GFR calc non Af Amer: 104 mL/min/{1.73_m2} (ref >59)
Globulin, Total: 2.4 g/dL (ref 1.5–4.5)
Glucose: 91 mg/dL (ref 65–99)
Potassium: 4.8 mmol/L (ref 3.5–5.2)
Sodium: 141 mmol/L (ref 134–144)
Total Protein: 6.4 g/dL (ref 6.0–8.5)

## 2017-08-15 LAB — CBC WITH DIFFERENTIAL/PLATELET
Basophils Absolute: 0.1 10*3/uL (ref 0.0–0.2)
Basos: 1 %
EOS (ABSOLUTE): 0.1 10*3/uL (ref 0.0–0.4)
Eos: 2 %
Hematocrit: 41.4 % (ref 37.5–51.0)
Hemoglobin: 13.6 g/dL (ref 13.0–17.7)
Immature Grans (Abs): 0 10*3/uL (ref 0.0–0.1)
Immature Granulocytes: 0 %
Lymphocytes Absolute: 1.1 10*3/uL (ref 0.7–3.1)
Lymphs: 23 %
MCH: 32.8 pg (ref 26.6–33.0)
MCHC: 32.9 g/dL (ref 31.5–35.7)
MCV: 100 fL — ABNORMAL HIGH (ref 79–97)
Monocytes Absolute: 0.4 10*3/uL (ref 0.1–0.9)
Monocytes: 8 %
Neutrophils Absolute: 3.3 10*3/uL (ref 1.4–7.0)
Neutrophils: 66 %
Platelets: 156 10*3/uL (ref 150–379)
RBC: 4.15 x10E6/uL (ref 4.14–5.80)
RDW: 13.7 % (ref 12.3–15.4)
WBC: 5 10*3/uL (ref 3.4–10.8)

## 2017-08-15 LAB — TSH: TSH: 2.22 u[IU]/mL (ref 0.450–4.500)

## 2017-08-15 LAB — VITAMIN B12: Vitamin B-12: 512 pg/mL (ref 232–1245)

## 2017-08-15 LAB — MAGNESIUM: Magnesium: 1.8 mg/dL (ref 1.6–2.3)

## 2017-08-15 LAB — FOLATE: Folate: 2.8 ng/mL — ABNORMAL LOW (ref >3.0)

## 2017-08-15 MED ORDER — FOLIC ACID 1 MG PO TABS: 1.0000 mg | ORAL_TABLET | Freq: Every day | ORAL | 3 refills | Status: DC

## 2017-08-17 ENCOUNTER — Telehealth: Payer: Self-pay | Admitting: *Deleted

## 2017-08-17 NOTE — Telephone Encounter (Signed)
-----   Message from Levert Feinstein, MD sent at 08/15/2017 10:55 AM EST ----- Call pt: Hb up to 13.6 which is great. B12 level good but folate level low: I will send Rx for folic acid 1 mg daily to his pharmacy. Thyroid function normal. Blood chemistries normal.

## 2017-08-17 NOTE — Telephone Encounter (Signed)
Called pt - no answer; left message to give me a call back. 

## 2017-08-21 ENCOUNTER — Other Ambulatory Visit (HOSPITAL_COMMUNITY): Payer: Self-pay | Admitting: *Deleted

## 2017-08-21 ENCOUNTER — Telehealth (HOSPITAL_COMMUNITY): Payer: Self-pay | Admitting: *Deleted

## 2017-08-21 DIAGNOSIS — I4819 Other persistent atrial fibrillation: Secondary | ICD-10-CM

## 2017-08-21 NOTE — Telephone Encounter (Signed)
Patient called in stating he cannot say with a 100% certainty that he did not miss a xarelto within the last 3 weeks and with his prior history of stroke after cardioversion they would prefer to have TEE prior to cardioversion (which is scheduled for today).  Called and spoke with Marisue Ivan in endo but they are unable to accommodate TEE today with anesthesia times. TEE/DCCV rescheduled for tomorrow 1/30. Pt aware of changes. He has not missed any Xarelto in the last 5 days.

## 2017-08-21 NOTE — Telephone Encounter (Signed)
Pt called / informed "Hb up to 13.6 which is great. B12 level good but folate level low: I will send Rx for folic acid 1 mg daily to his pharmacy. Thyroid function normal. Blood chemistries normal." per Dr Cyndie Chime. Voiced understanding.

## 2017-08-22 ENCOUNTER — Encounter (HOSPITAL_COMMUNITY): Payer: Self-pay | Admitting: Cardiovascular Disease

## 2017-08-22 ENCOUNTER — Ambulatory Visit (HOSPITAL_BASED_OUTPATIENT_CLINIC_OR_DEPARTMENT_OTHER)
Admission: RE | Admit: 2017-08-22 | Discharge: 2017-08-22 | Disposition: A | Discharge status: Home / Self Care | Payer: BLUE CROSS/BLUE SHIELD | Source: Ambulatory Visit | Attending: Nurse Practitioner | Admitting: Nurse Practitioner

## 2017-08-22 ENCOUNTER — Encounter (HOSPITAL_COMMUNITY)
Admission: RE | Disposition: A | Discharge status: Home / Self Care | Payer: Self-pay | Source: Ambulatory Visit | Attending: Cardiovascular Disease

## 2017-08-22 ENCOUNTER — Ambulatory Visit (HOSPITAL_COMMUNITY): Payer: BLUE CROSS/BLUE SHIELD | Admitting: Certified Registered"

## 2017-08-22 ENCOUNTER — Ambulatory Visit (HOSPITAL_COMMUNITY)
Admission: RE | Admit: 2017-08-22 | Discharge: 2017-08-22 | Disposition: A | Discharge status: Home / Self Care | Payer: BLUE CROSS/BLUE SHIELD | Source: Ambulatory Visit | Attending: Cardiovascular Disease | Admitting: Cardiovascular Disease

## 2017-08-22 ENCOUNTER — Other Ambulatory Visit: Payer: Self-pay

## 2017-08-22 DIAGNOSIS — I481 Persistent atrial fibrillation: Secondary | ICD-10-CM | POA: Insufficient documentation

## 2017-08-22 DIAGNOSIS — G610 Guillain-Barre syndrome: Secondary | ICD-10-CM | POA: Insufficient documentation

## 2017-08-22 DIAGNOSIS — G4730 Sleep apnea, unspecified: Secondary | ICD-10-CM | POA: Diagnosis not present

## 2017-08-22 DIAGNOSIS — Z9889 Other specified postprocedural states: Secondary | ICD-10-CM | POA: Insufficient documentation

## 2017-08-22 DIAGNOSIS — E538 Deficiency of other specified B group vitamins: Secondary | ICD-10-CM | POA: Insufficient documentation

## 2017-08-22 DIAGNOSIS — Z6834 Body mass index (BMI) 34.0-34.9, adult: Secondary | ICD-10-CM | POA: Insufficient documentation

## 2017-08-22 DIAGNOSIS — Z841 Family history of disorders of kidney and ureter: Secondary | ICD-10-CM | POA: Diagnosis not present

## 2017-08-22 DIAGNOSIS — Z823 Family history of stroke: Secondary | ICD-10-CM | POA: Diagnosis not present

## 2017-08-22 DIAGNOSIS — E876 Hypokalemia: Secondary | ICD-10-CM | POA: Diagnosis not present

## 2017-08-22 DIAGNOSIS — Z8249 Family history of ischemic heart disease and other diseases of the circulatory system: Secondary | ICD-10-CM | POA: Diagnosis not present

## 2017-08-22 DIAGNOSIS — Z79899 Other long term (current) drug therapy: Secondary | ICD-10-CM | POA: Insufficient documentation

## 2017-08-22 DIAGNOSIS — Z87891 Personal history of nicotine dependence: Secondary | ICD-10-CM | POA: Diagnosis not present

## 2017-08-22 DIAGNOSIS — G709 Myoneural disorder, unspecified: Secondary | ICD-10-CM | POA: Insufficient documentation

## 2017-08-22 DIAGNOSIS — Z7901 Long term (current) use of anticoagulants: Secondary | ICD-10-CM | POA: Diagnosis not present

## 2017-08-22 DIAGNOSIS — I10 Essential (primary) hypertension: Secondary | ICD-10-CM | POA: Insufficient documentation

## 2017-08-22 DIAGNOSIS — Z9884 Bariatric surgery status: Secondary | ICD-10-CM | POA: Diagnosis not present

## 2017-08-22 DIAGNOSIS — I340 Nonrheumatic mitral (valve) insufficiency: Secondary | ICD-10-CM | POA: Diagnosis not present

## 2017-08-22 DIAGNOSIS — K219 Gastro-esophageal reflux disease without esophagitis: Secondary | ICD-10-CM | POA: Insufficient documentation

## 2017-08-22 DIAGNOSIS — M170 Bilateral primary osteoarthritis of knee: Secondary | ICD-10-CM | POA: Diagnosis not present

## 2017-08-22 DIAGNOSIS — D649 Anemia, unspecified: Secondary | ICD-10-CM | POA: Diagnosis not present

## 2017-08-22 DIAGNOSIS — Z8673 Personal history of transient ischemic attack (TIA), and cerebral infarction without residual deficits: Secondary | ICD-10-CM | POA: Diagnosis not present

## 2017-08-22 DIAGNOSIS — I739 Peripheral vascular disease, unspecified: Secondary | ICD-10-CM | POA: Diagnosis not present

## 2017-08-22 DIAGNOSIS — I4819 Other persistent atrial fibrillation: Secondary | ICD-10-CM

## 2017-08-22 DIAGNOSIS — Z833 Family history of diabetes mellitus: Secondary | ICD-10-CM | POA: Diagnosis not present

## 2017-08-22 DIAGNOSIS — I4891 Unspecified atrial fibrillation: Secondary | ICD-10-CM | POA: Diagnosis not present

## 2017-08-22 DIAGNOSIS — I5042 Chronic combined systolic (congestive) and diastolic (congestive) heart failure: Secondary | ICD-10-CM | POA: Diagnosis not present

## 2017-08-22 HISTORY — PX: CARDIOVERSION: SHX1299

## 2017-08-22 HISTORY — PX: TEE WITHOUT CARDIOVERSION: SHX5443

## 2017-08-22 SURGERY — ECHOCARDIOGRAM, TRANSESOPHAGEAL
Anesthesia: General

## 2017-08-22 MED ORDER — SODIUM CHLORIDE 0.9 % IV SOLN
INTRAVENOUS | Status: DC
  Administered 2017-08-22: 500 mL via INTRAVENOUS

## 2017-08-22 MED ORDER — BUTAMBEN-TETRACAINE-BENZOCAINE 2-2-14 % EX AERO
INHALATION_SPRAY | CUTANEOUS | Status: DC | PRN
  Administered 2017-08-22: 2 via TOPICAL

## 2017-08-22 MED ORDER — PROPOFOL 500 MG/50ML IV EMUL
INTRAVENOUS | Status: DC | PRN
  Administered 2017-08-22: 100 ug/kg/min via INTRAVENOUS

## 2017-08-22 MED ORDER — ONDANSETRON HCL 4 MG/2ML IJ SOLN: 4.0000 mg | Freq: Once | INTRAMUSCULAR | Status: DC | PRN

## 2017-08-22 MED ORDER — MIDAZOLAM HCL 5 MG/ML IJ SOLN
INTRAMUSCULAR | Status: AC
  Filled 2017-08-22: qty 2

## 2017-08-22 MED ORDER — FENTANYL CITRATE (PF) 100 MCG/2ML IJ SOLN
INTRAMUSCULAR | Status: AC
  Filled 2017-08-22: qty 2

## 2017-08-22 MED ORDER — PROPOFOL 10 MG/ML IV BOLUS
INTRAVENOUS | Status: DC | PRN
  Administered 2017-08-22: 50 mg via INTRAVENOUS

## 2017-08-22 NOTE — CV Procedure (Signed)
Brief TEE Note  LVEF 55% Trivial TR, mild MR No LA/LAA thrombus or mass No ASD or PFO  Electrical Cardioversion Procedure Note Tyrone [name] [id number] February 13, 1966  Procedure: Electrical Cardioversion Indications:  Atrial Fibrillation  Procedure Details Consent: Risks of procedure as well as the alternatives and risks of each were explained to the (patient/caregiver).  Consent for procedure obtained. Time Out: Verified patient identification, verified procedure, site/side was marked, verified correct patient position, special equipment/implants available, medications/allergies/relevent history reviewed, required imaging and test results available.  Performed  Patient placed on cardiac monitor, pulse oximetry, supplemental oxygen as necessary.  Sedation given: propofol Pacer pads placed anterior and posterior chest.  Cardioverted 1  time(s).  Cardioverted at 150J.  Evaluation Findings: Post procedure EKG shows: NSR Complications: None Patient did tolerate procedure well.   Chilton Si, MD 08/22/2017, 12:19 PM

## 2017-08-22 NOTE — Discharge Instructions (Signed)
TEE ° °YOU HAD AN CARDIAC PROCEDURE TODAY: Refer to the procedure report and other information in the discharge instructions given to you for any specific questions about what was found during the examination. If this information does not answer your questions, please call [facility] office at [phone] to clarify.  ° °DIET: Your first meal following the procedure should be a light meal and then it is ok to progress to your normal diet. A half-sandwich or bowl of soup is an example of a good first meal. Heavy or fried foods are harder to digest and may make you feel nauseous or bloated. Drink plenty of fluids but you should avoid alcoholic beverages for 24 hours. If you had a esophageal dilation, please see attached instructions for diet.  ° °ACTIVITY: Your care partner should take you home directly after the procedure. You should plan to take it easy, moving slowly for the rest of the day. You can resume normal activity the day after the procedure however YOU SHOULD NOT DRIVE, use power tools, machinery or perform tasks that involve climbing or major physical exertion for 24 hours (because of the sedation medicines used during the test).  ° °SYMPTOMS TO REPORT IMMEDIATELY: °A cardiologist can be reached at any hour. Please call [phone] for any of the following symptoms:  °Vomiting of blood or coffee ground material  °New, significant abdominal pain  °New, significant chest pain or pain under the shoulder blades  °Painful or persistently difficult swallowing  °New shortness of breath  °Black, tarry-looking or red, bloody stools ° °FOLLOW UP:  °Please also call with any specific questions about appointments or follow up tests. ° °Electrical Cardioversion, Care After °This sheet gives you information about how to care for yourself after your procedure. Your health care provider may also give you more specific instructions. If you have problems or questions, contact your health care provider. °What can I  expect after the procedure? °After the procedure, it is common to have: °· Some redness on the skin where the shocks were given. ° °Follow these instructions at home: °· Do not drive for 24 hours if you were given a medicine to help you relax (sedative). °· Take over-the-counter and prescription medicines only as told by your health care provider. °· Ask your health care provider how to check your pulse. Check it often. °· Rest for 48 hours after the procedure or as told by your health care provider. °· Avoid or limit your caffeine use as told by your health care provider. °Contact a health care provider if: °· You feel like your heart is beating too quickly or your pulse is not regular. °· You have a serious muscle cramp that does not go away. °Get help right away if: °· You have discomfort in your chest. °· You are dizzy or you feel faint. °· You have trouble breathing or you are short of breath. °· Your speech is slurred. °· You have trouble moving an arm or leg on one side of your body. °· Your fingers or toes turn cold or blue. °This information is not intended to replace advice given to you by your health care provider. Make sure you discuss any questions you have with your health care provider. °Document Released: 04/30/2013 Document Revised: 02/11/2016 Document Reviewed: 01/14/2016 °Elsevier Interactive Patient Education © 2018 [employer] ° °

## 2017-08-22 NOTE — Progress Notes (Signed)
  Echocardiogram Echocardiogram Transesophageal has been performed.  Laiyah Exline T Nora Sabey 08/22/2017, 12:59 PM

## 2017-08-22 NOTE — Anesthesia Procedure Notes (Signed)
Procedure Name: MAC Date/Time: 08/22/2017 12:06 PM Performed by: Lance Coon, CRNA Pre-anesthesia Checklist: Patient identified, Emergency Drugs available, Suction available, Patient being monitored and Timeout performed Patient Re-evaluated:Patient Re-evaluated prior to induction Oxygen Delivery Method: Nasal cannula

## 2017-08-22 NOTE — Anesthesia Postprocedure Evaluation (Signed)
Anesthesia Post Note  Patient: Tyrone [name]  Procedure(s) Performed: TRANSESOPHAGEAL ECHOCARDIOGRAM (TEE) (N/A ) CARDIOVERSION (N/A )     Patient location during evaluation: PACU Anesthesia Type: General Level of consciousness: awake and alert and oriented Pain management: pain level controlled Vital Signs Assessment: post-procedure vital signs reviewed and stable Respiratory status: spontaneous breathing, nonlabored ventilation and respiratory function stable Cardiovascular status: blood pressure returned to baseline and stable Postop Assessment: no apparent nausea or vomiting Anesthetic complications: no    Last Vitals:  Vitals:   08/22/17 1229 08/22/17 1230  BP: 113/73 113/73  Pulse: 64 (!) 57  Resp: 13 17  Temp: (!) 36.4 C   SpO2: 96% 96%    Last Pain:  Vitals:   08/22/17 1229  TempSrc: Oral                 [name] A.

## 2017-08-22 NOTE — Transfer of Care (Signed)
Immediate Anesthesia Transfer of Care Note  Patient: Tyrone [name]  Procedure(s) Performed: TRANSESOPHAGEAL ECHOCARDIOGRAM (TEE) (N/A ) CARDIOVERSION (N/A )  Patient Location: Endoscopy Unit  Anesthesia Type:General  Level of Consciousness: awake and patient cooperative  Airway & Oxygen Therapy: Patient Spontanous Breathing  Post-op Assessment: Report given to RN and Post -op Vital signs reviewed and stable  Post vital signs: Reviewed and stable  Last Vitals:  Vitals:   08/22/17 1049  BP: (!) 129/92  Pulse: (!) 122  Resp: 15  Temp: 37 C  SpO2: 97%    Last Pain:  Vitals:   08/22/17 1049  TempSrc: Oral         Complications: No apparent anesthesia complications

## 2017-08-22 NOTE — Anesthesia Preprocedure Evaluation (Addendum)
Anesthesia Evaluation  Patient identified by MRN, date of birth, ID band Patient awake    Reviewed: Allergy & Precautions, NPO status , Patient's Chart, lab work & pertinent test results, reviewed documented beta blocker date and time   Airway Mallampati: III  TM Distance: >3 FB Neck ROM: Full    Dental no notable dental hx. (+) Teeth Intact, Chipped,    Pulmonary sleep apnea and Continuous Positive Airway Pressure Ventilation ,    Pulmonary exam normal breath sounds clear to auscultation       Cardiovascular hypertension, Pt. on medications and Pt. on home beta blockers + Peripheral Vascular Disease  Normal cardiovascular exam+ dysrhythmias Atrial Fibrillation  Rhythm:Irregular Rate:Tachycardia + Systolic murmurs    Neuro/Psych Hx/o Guillian-Barre after stroke Mild left hand weakness CVA 2013 9 days post cardioversion  Neuromuscular disease CVA, Residual Symptoms    GI/Hepatic Neg liver ROS, GERD  Medicated and Controlled,S/P Gastric bypass Screening colonoscopy and EGD   Endo/Other  Morbid obesity  Renal/GU negative Renal ROS  negative genitourinary   Musculoskeletal  (+) Arthritis , Osteoarthritis,    Abdominal (+) + obese,   Peds  Hematology  (+) anemia , On xarelto-last dose last night   Anesthesia Other Findings   Reproductive/Obstetrics                          Anesthesia Physical  Anesthesia Plan  ASA: III  Anesthesia Plan: General   Post-op Pain Management:    Induction:   PONV Risk Score and Plan: 1 and Propofol infusion and Treatment may vary due to age or medical condition  Airway Management Planned: Nasal Cannula, Natural Airway and Mask  Additional Equipment:   Intra-op Plan:   Post-operative Plan:   Informed Consent: I have reviewed the patients History and Physical, chart, labs and discussed the procedure including the risks, benefits and alternatives for the  proposed anesthesia with the patient or authorized representative who has indicated his/her understanding and acceptance.   Dental advisory given  Plan Discussed with: CRNA, Anesthesiologist and Surgeon  Anesthesia Plan Comments:        Anesthesia Quick Evaluation

## 2017-08-23 ENCOUNTER — Encounter (HOSPITAL_COMMUNITY): Payer: Self-pay | Admitting: Cardiovascular Disease

## 2017-09-10 ENCOUNTER — Ambulatory Visit: Payer: BLUE CROSS/BLUE SHIELD | Admitting: Internal Medicine

## 2017-09-10 ENCOUNTER — Encounter: Payer: Self-pay | Admitting: Internal Medicine

## 2017-09-10 ENCOUNTER — Telehealth: Payer: Self-pay | Admitting: Internal Medicine

## 2017-09-10 VITALS — BP 122/64 | HR 127 | Ht 66.0 in | Wt 212.0 lb

## 2017-09-10 DIAGNOSIS — I4819 Other persistent atrial fibrillation: Secondary | ICD-10-CM

## 2017-09-10 DIAGNOSIS — I10 Essential (primary) hypertension: Secondary | ICD-10-CM

## 2017-09-10 DIAGNOSIS — I481 Persistent atrial fibrillation: Secondary | ICD-10-CM | POA: Diagnosis not present

## 2017-09-10 DIAGNOSIS — I428 Other cardiomyopathies: Secondary | ICD-10-CM

## 2017-09-10 MED ORDER — METOPROLOL TARTRATE 50 MG PO TABS: 50.0000 mg | ORAL_TABLET | Freq: Two times a day (BID) | ORAL | 3 refills | Status: DC

## 2017-09-10 NOTE — Patient Instructions (Addendum)
Medication Instructions:  Your physician has recommended you make the following change in your medication:  1.  Increase your metoprolol.  Take one tablet (50 mg) by mouth twice a day.   Labwork: You will need to get lab work prior to your ablation.  You will need a BMP and CBC within 14 days of your procedure October 04, 2017. Please schedule for a lab draw.  Testing/Procedures:  Your physician has requested that you have cardiac CT. Cardiac computed tomography (CT) is a painless test that uses an x-ray machine to take clear, detailed pictures of your heart. For further information please visit [email or web address] Please follow instruction sheet as given.  You will get a phone call to schedule your cardiac CT after insurance approves.  Directions for cardiac CT:  [facility] [address] [place], [place] [address] [phone]  Proceed to the [facility] Radiology Department (First Floor).  Please follow these instructions carefully (unless otherwise directed):  Hold all erectile dysfunction medications at least 48 hours prior to test.  On the Night Before the Test: . Drink plenty of water. . Do not consume any caffeinated/decaffeinated beverages or chocolate 12 hours prior to your test. . Do not take any antihistamines 12 hours prior to your test. . If you take Metformin do not take 24 hours prior to test.  On the Day of the Test: . Drink plenty of water. Do not drink any water within one hour of the test. . Do not eat any food 4 hours prior to the test. . You may take your regular medications prior to the test. .  Take 50 mg of lopressor (metoprolol) one hour before the test. . HOLD Furosemide morning of the test.  After the Test: . Drink plenty of water. . After receiving IV contrast, you may experience a mild flushed feeling. This is normal. . On occasion, you may experience a mild rash up to 24 hours after the test. This is not dangerous. If this occurs,  you can take Benadryl 25 mg and increase your fluid intake. . If you experience trouble breathing, this can be serious. If it is severe call 911 IMMEDIATELY. If it is mild, please call our office. . If you take any of these medications: Glipizide/Metformin, Avandament, Glucavance, please do not take 48 hours after completing test.  Your physician has recommended that you have an ablation. Catheter ablation is a medical procedure used to treat some cardiac arrhythmias (irregular heartbeats). During catheter ablation, a long, thin, flexible tube is put into a blood vessel in your groin (upper thigh), or neck. This tube is called an ablation catheter. It is then guided to your heart through the blood vessel. Radio frequency waves destroy small areas of heart tissue where abnormal heartbeats may cause an arrhythmia to start. Please see the instruction sheet given to you today.  Follow-Up: You will follow up with in 4 weeks with [name], NP at the Afib clinic. You will follow up with Dr. [name] 3 months after your procedure.  Any Other Special Instructions Will Be Listed Below (If Applicable).  Please arrive at the [facility] main entrance of [facility] at:  9:30 am on October 04, 2017 Do not eat or drink after midnight prior to procedure Do not take any medications the morning of the procedure-Do not miss any doses of your Xarelto.   Plan for one night stay.  You will need someone to drive you home at discharge.  Cardiac Ablation Cardiac ablation is a procedure to disable (ablate) a small amount of heart tissue in very specific places. The heart has many electrical connections. Sometimes these connections are abnormal and can cause the heart to beat very fast or irregularly. Ablating some of the problem areas can improve the heart rhythm or return it to normal. Ablation may be done for people who:  Have Wolff-Parkinson-White syndrome.  Have fast heart rhythms (tachycardia).  Have  taken medicines for an abnormal heart rhythm (arrhythmia) that were not effective or caused side effects.  Have a high-risk heartbeat that may be life-threatening.  During the procedure, a small incision is made in the neck or the groin, and a long, thin, flexible tube (catheter) is inserted into the incision and moved to the heart. Small devices (electrodes) on the tip of the catheter will send out electrical currents. A type of X-ray (fluoroscopy) will be used to help guide the catheter and to provide images of the heart. Tell a health care provider about:  Any allergies you have.  All medicines you are taking, including vitamins, herbs, eye drops, creams, and over-the-counter medicines.  Any problems you or family members have had with anesthetic medicines.  Any blood disorders you have.  Any surgeries you have had.  Any medical conditions you have, such as kidney failure.  Whether you are pregnant or may be pregnant. What are the risks? Generally, this is a safe procedure. However, problems may occur, including:  Infection.  Bruising and bleeding at the catheter insertion site.  Bleeding into the chest, especially into the sac that surrounds the heart. This is a serious complication.  Stroke or blood clots.  Damage to other structures or organs.  Allergic reaction to medicines or dyes.  Need for a permanent pacemaker if the normal electrical system is damaged. A pacemaker is a small computer that sends electrical signals to the heart and helps your heart beat normally.  The procedure not being fully effective. This may not be recognized until months later. Repeat ablation procedures are sometimes required.  What happens before the procedure?  Follow instructions from your health care provider about eating or drinking restrictions.  Ask your health care provider about: ? Changing or stopping your regular medicines. This is especially important if you are taking diabetes  medicines or blood thinners. ? Taking medicines such as aspirin and ibuprofen. These medicines can thin your blood. Do not take these medicines before your procedure if your health care provider instructs you not to.  Plan to have someone take you home from the hospital or clinic.  If you will be going home right after the procedure, plan to have someone with you for 24 hours. What happens during the procedure?  To lower your risk of infection: ? Your health care team will wash or sanitize their hands. ? Your skin will be washed with soap. ? Hair may be removed from the incision area.  An IV tube will be inserted into one of your veins.  You will be given a medicine to help you relax (sedative).  The skin on your neck or groin will be numbed.  An incision will be made in your neck or your groin.  A needle will be inserted through the incision and into a large vein in your neck or groin.  A catheter will be inserted into the needle and moved to your heart.  Dye may be injected through the catheter to help your surgeon see the area  of the heart that needs treatment.  Electrical currents will be sent from the catheter to ablate heart tissue in desired areas. There are three types of energy that may be used to ablate heart tissue: ? Heat (radiofrequency energy). ? Laser energy. ? Extreme cold (cryoablation).  When the necessary tissue has been ablated, the catheter will be removed.  Pressure will be held on the catheter insertion area to prevent excessive bleeding.  A bandage (dressing) will be placed over the catheter insertion area. The procedure may vary among health care providers and hospitals. What happens after the procedure?  Your blood pressure, heart rate, breathing rate, and blood oxygen level will be monitored until the medicines you were given have worn off.  Your catheter insertion area will be monitored for bleeding. You will need to lie still for a few hours to  ensure that you do not bleed from the catheter insertion area.  Do not drive for 24 hours or as long as directed by your health care provider. Summary  Cardiac ablation is a procedure to disable (ablate) a small amount of heart tissue in very specific places. Ablating some of the problem areas can improve the heart rhythm or return it to normal.  During the procedure, electrical currents will be sent from the catheter to ablate heart tissue in desired areas. This information is not intended to replace advice given to you by your health care provider. Make sure you discuss any questions you have with your health care provider. Document Released: 11/26/2008 Document Revised: 05/29/2016 Document Reviewed: 05/29/2016 Elsevier Interactive Patient Education  [employer].

## 2017-09-10 NOTE — H&P (View-Only) (Signed)
PCP: Porfirio Oar, PA-C Primary Cardiologist: Dr Antoine Poche Primary EP: Dr [name]  Tyrone [name] is a 52 y.o. male who presents today for routine electrophysiology followup.  He remains in afib despite cardioversion 08/22/17.  He has shortness of breath and fatigue.  His apple watch reveals that his heart rates are mostly > 100 bpm.  Today, he denies symptoms of chest pain, shortness of breath,  lower extremity edema, dizziness, presyncope, or syncope.  The patient is otherwise without complaint today.   Past Medical History:  Diagnosis Date  . Anemia, iron deficiency 01/30/2017  . Cervicalgia   . CVA (cerebral infarction) 05/01/12   on pradaxa at time of stroke, switched to xarelto  . External thrombosed hemorrhoids   . GERD (gastroesophageal reflux disease)   . Guillain Barr syndrome [facility])    following seasonal influenza vaccination  . Guillain-Barre syndrome (HCC)    from stroke in 2013  . Hematuria    a. 01/2012 - to f/u with urology as outpt.  Marland Kitchen Hernia of unspecified site of abdominal cavity without mention of obstruction or gangrene    of abdominal  . Hx of gastric bypass 01/30/2017   2004 Dr Clent Ridges [facility] weight 450 lbs  . Hypertension   . Iron malabsorption 01/30/2017   Hx gastric bypass 2004  . Morbid obesity s/p gastric bypass   . Nonischemic cardiomyopathy (HCC)    a. 01/2012 Echo: EF 35-40%, Diff HK, mild MR;  b. 01/2012 R&L Heart Cath: mildly elevated R heart pressures (pcwp 17) , nl cors.  . Osteoarthrosis, unspecified whether generalized or localized, ankle and foot    of the knees,ankle and foot  . Pancytopenia   . Persistent atrial fibrillation (HCC)    a. Pradaxa started 01/2012;  b .s/p TEE/DCCV 02/16/2012; c. recurrent afib->Tikosyn Initiation 03/2012  . Sleep apnea    CPAP compliant  . Status post bypass gastrojejunostomy 01/30/2017  . Unspecified intestinal malabsorption   . Urticaria, unspecified   . Vitamin B 12 deficiency    Past Surgical  History:  Procedure Laterality Date  . ATRIAL FIBRILLATION ABLATION N/A 06/25/2012   PVI by Dr [name]  . CARDIOVERSION  02/16/2012   Procedure: CARDIOVERSION;  Surgeon: [name], MD;  Location: [facility] ENDOSCOPY;  Service: Cardiovascular;  Laterality: N/A;  . CARDIOVERSION  04/22/2012   Procedure: CARDIOVERSION;  Surgeon: [name], MD;  Location: [facility] ENDOSCOPY;  Service: Cardiovascular;  Laterality: N/A;  . CARDIOVERSION N/A 08/22/2017   Procedure: CARDIOVERSION;  Surgeon: Chilton Si, MD;  Location: [facility] ENDOSCOPY;  Service: Cardiovascular;  Laterality: N/A;  . COLONOSCOPY N/A 07/07/2016   Procedure: COLONOSCOPY;  Surgeon: Jeani Hawking, MD;  Location: [facility] ENDOSCOPY;  Service: Endoscopy;  Laterality: N/A;  . ESOPHAGOGASTRODUODENOSCOPY N/A 07/07/2016   Procedure: ESOPHAGOGASTRODUODENOSCOPY (EGD);  Surgeon: Jeani Hawking, MD;  Location: [facility] ENDOSCOPY;  Service: Endoscopy;  Laterality: N/A;  . GASTRIC BYPASS  2004  . HERNIA REPAIR    . LEFT AND RIGHT HEART CATHETERIZATION WITH CORONARY ANGIOGRAM N/A 02/13/2012   Procedure: LEFT AND RIGHT HEART CATHETERIZATION WITH CORONARY ANGIOGRAM;  Surgeon: Tonny Bollman, MD;  Location: [facility] CATH LAB;  Service: Cardiovascular;  Laterality: N/A;  . TEE WITHOUT CARDIOVERSION  02/16/2012   Procedure: TRANSESOPHAGEAL ECHOCARDIOGRAM (TEE);  Surgeon: [name], MD;  Location: [facility] ENDOSCOPY;  Service: Cardiovascular;  Laterality: N/A;  10a CV  . TEE WITHOUT CARDIOVERSION  06/24/2012   Procedure: TRANSESOPHAGEAL ECHOCARDIOGRAM (TEE);  Surgeon: [name], MD;  Location: [facility] ENDOSCOPY;  Service: Cardiovascular;  Laterality: N/A;  . TEE WITHOUT CARDIOVERSION N/A 08/22/2017   Procedure: TRANSESOPHAGEAL ECHOCARDIOGRAM (TEE);  Surgeon: Chilton Si, MD;  Location: [facility] ENDOSCOPY;  Service: Cardiovascular;  Laterality: N/A;  . UMBILICAL HERNIA REPAIR     x 2    ROS- all systems are reviewed and negatives except as per HPI above  Current Outpatient  Medications  Medication Sig Dispense Refill  . amLODipine (NORVASC) 5 MG tablet TAKE ONE TABLET BY MOUTH ONCE DAILY 30 tablet 1  . ferrous sulfate 325 (65 FE) MG tablet Take 325 mg by mouth daily with breakfast.    . folic acid (FOLVITE) 1 MG tablet Take 1 tablet (1 mg total) by mouth daily. 100 tablet 3  . furosemide (LASIX) 40 MG tablet Take 20 mg by mouth daily.    Marland Kitchen ibuprofen (ADVIL,MOTRIN) 200 MG tablet Take 400 mg by mouth every 8 (eight) hours as needed (for pain.).    Marland Kitchen losartan (COZAAR) 100 MG tablet Take 0.5 tablets (50 mg total) by mouth 2 (two) times daily. 90 tablet 3  . Methylcellulose, Laxative, (CITRUCEL) 500 MG TABS Take 500 mg by mouth daily.    . metoprolol tartrate (LOPRESSOR) 50 MG tablet Take 0.5 tablets (25 mg total) by mouth 2 (two) times daily. 30 tablet 6  . Multiple Vitamin (MULTIVITAMIN) tablet Take 1-2 tablets by mouth 2 (two) times daily. Take 2 tablets in the morning and 1 tablet in the evening    . omeprazole (PRILOSEC) 40 MG capsule Take 40 mg by mouth every morning.   1  . spironolactone (ALDACTONE) 50 MG tablet TAKE ONE TABLET BY MOUTH ONCE DAILY 30 tablet 1  . vitamin E (VITAMIN E) 400 UNIT capsule Take 400 Units by mouth daily.    [name] Hurl 20 MG TABS tablet TAKE ONE TABLET BY MOUTH DAILY WITH  SUPPER 30 tablet 1   No current facility-administered medications for this visit.    Facility-Administered Medications Ordered in Other Visits  Medication Dose Route Frequency Provider Last Rate Last Dose  . gadopentetate dimeglumine (MAGNEVIST) injection 20 mL  20 mL Intravenous Once PRN Anson Fret, MD        Physical Exam: Vitals:   09/10/17 1228  BP: 122/64  Pulse: (!) 127  Weight: 212 lb (96.2 kg)  Height: 5\' 6"  (1.676 m)    GEN- The patient is well appearing, alert and oriented x 3 today.   Head- normocephalic, atraumatic Eyes-  Sclera clear, conjunctiva pink Ears- hearing intact Oropharynx- clear Lungs- Clear to ausculation bilaterally,  normal work of breathing Heart- irregular rate and rhythm, no murmurs, rubs or gallops, PMI not laterally displaced GI- soft, NT, ND, + BS Extremities- no clubbing, cyanosis, or edema  EKG tracing ordered today is personally reviewed and shows afib, V rate 127 bpm, incomplete RBBB  Assessment and Plan:  1. Persistent afib Failed cardioversion On xarelto Therapeutic strategies for afib including medicine (flecainide, amiodarone) and ablation were discussed in detail with the patient today. He did not tolerate tikosyn due to qt prolongation previously.  Risk, benefits, and alternatives to EP study and radiofrequency ablation for afib were also discussed in detail today. These risks include but are not limited to stroke, bleeding, vascular damage, tamponade, perforation, damage to the esophagus, lungs, and other structures, pulmonary vein stenosis, worsening renal function, and death. The patient understands these risk and wishes to proceed.  We will therefore proceed with catheter ablation at the next available time.  Will plan cardiac  CT prior to ablation.  2. Nonischemic CM EF has previously recovered with sinus rhythm I worry about elevated V rates going forward.  He would do better with sinus rhythm Increase metoprolol to 50mg  BID today.   He will follow heart rates on his apple watch.  3. Obesity Body mass index is 34.22 kg/m.  S/p bariatric surgery Lifestyle modification is encouraged  4. HTN Stable No change required today   Hillis Range MD, [facility] 09/10/2017 12:49 PM

## 2017-09-10 NOTE — Telephone Encounter (Signed)
Patient calling stating he is in A. FIB with elevated HR. Patient stated since Saturday he has been running between 104 to 140's. BP is at 116/91. Patient has taken his metoprolol 25 mg today. Patient stated he is unable to handle the whole tablet, that he has passed out in the past. Patient stated he has episodes were he feels like he needs to catch his breath at times as well. Called A. FIB clinic, they stated patient should see Dr. [name], that if he was back in A. FIB they would need to discuss Ablation. Dr. [name] had an opening at 12:15, patient stated he could make that appointment. Scheduled patient with Dr. [name] today.

## 2017-09-10 NOTE — Telephone Encounter (Signed)
New message   Patient c/o Palpitations:  High priority if patient c/o lightheadedness, shortness of breath, or chest pain  1) How long have you had palpitations/irregular HR/ Afib? Are you having the symptoms now? Since 2pm on 2/16  2) Are you currently experiencing lightheadedness, SOB or CP? no  3) Do you have a history of afib (atrial fibrillation) or irregular heart rhythm? Yes  4) Have you checked your BP or HR? (document readings if available): says BP is goo but HR is ranging between 113-140  5) Are you experiencing any other symptoms? Pt just had a cardioversion and now his heart has went back out of rhythm and double what it normally is

## 2017-09-10 NOTE — Progress Notes (Signed)
PCP: Porfirio Oar, PA-C Primary Cardiologist: Dr Antoine Poche Primary EP: Dr [name]  Tyrone [name] is a 52 y.o. male who presents today for routine electrophysiology followup.  He remains in afib despite cardioversion 08/22/17.  He has shortness of breath and fatigue.  His apple watch reveals that his heart rates are mostly > 100 bpm.  Today, he denies symptoms of chest pain, shortness of breath,  lower extremity edema, dizziness, presyncope, or syncope.  The patient is otherwise without complaint today.   Past Medical History:  Diagnosis Date  . Anemia, iron deficiency 01/30/2017  . Cervicalgia   . CVA (cerebral infarction) 05/01/12   on pradaxa at time of stroke, switched to xarelto  . External thrombosed hemorrhoids   . GERD (gastroesophageal reflux disease)   . Guillain Barr syndrome [facility])    following seasonal influenza vaccination  . Guillain-Barre syndrome (HCC)    from stroke in 2013  . Hematuria    a. 01/2012 - to f/u with urology as outpt.  Marland Kitchen Hernia of unspecified site of abdominal cavity without mention of obstruction or gangrene    of abdominal  . Hx of gastric bypass 01/30/2017   2004 Dr Clent Ridges [facility] weight 450 lbs  . Hypertension   . Iron malabsorption 01/30/2017   Hx gastric bypass 2004  . Morbid obesity s/p gastric bypass   . Nonischemic cardiomyopathy (HCC)    a. 01/2012 Echo: EF 35-40%, Diff HK, mild MR;  b. 01/2012 R&L Heart Cath: mildly elevated R heart pressures (pcwp 17) , nl cors.  . Osteoarthrosis, unspecified whether generalized or localized, ankle and foot    of the knees,ankle and foot  . Pancytopenia   . Persistent atrial fibrillation (HCC)    a. Pradaxa started 01/2012;  b .s/p TEE/DCCV 02/16/2012; c. recurrent afib->Tikosyn Initiation 03/2012  . Sleep apnea    CPAP compliant  . Status post bypass gastrojejunostomy 01/30/2017  . Unspecified intestinal malabsorption   . Urticaria, unspecified   . Vitamin B 12 deficiency    Past Surgical  History:  Procedure Laterality Date  . ATRIAL FIBRILLATION ABLATION N/A 06/25/2012   PVI by Dr [name]  . CARDIOVERSION  02/16/2012   Procedure: CARDIOVERSION;  Surgeon: [name], MD;  Location: [facility] ENDOSCOPY;  Service: Cardiovascular;  Laterality: N/A;  . CARDIOVERSION  04/22/2012   Procedure: CARDIOVERSION;  Surgeon: [name], MD;  Location: [facility] ENDOSCOPY;  Service: Cardiovascular;  Laterality: N/A;  . CARDIOVERSION N/A 08/22/2017   Procedure: CARDIOVERSION;  Surgeon: Chilton Si, MD;  Location: [facility] ENDOSCOPY;  Service: Cardiovascular;  Laterality: N/A;  . COLONOSCOPY N/A 07/07/2016   Procedure: COLONOSCOPY;  Surgeon: Jeani Hawking, MD;  Location: [facility] ENDOSCOPY;  Service: Endoscopy;  Laterality: N/A;  . ESOPHAGOGASTRODUODENOSCOPY N/A 07/07/2016   Procedure: ESOPHAGOGASTRODUODENOSCOPY (EGD);  Surgeon: Jeani Hawking, MD;  Location: [facility] ENDOSCOPY;  Service: Endoscopy;  Laterality: N/A;  . GASTRIC BYPASS  2004  . HERNIA REPAIR    . LEFT AND RIGHT HEART CATHETERIZATION WITH CORONARY ANGIOGRAM N/A 02/13/2012   Procedure: LEFT AND RIGHT HEART CATHETERIZATION WITH CORONARY ANGIOGRAM;  Surgeon: Tonny Bollman, MD;  Location: [facility] CATH LAB;  Service: Cardiovascular;  Laterality: N/A;  . TEE WITHOUT CARDIOVERSION  02/16/2012   Procedure: TRANSESOPHAGEAL ECHOCARDIOGRAM (TEE);  Surgeon: [name], MD;  Location: [facility] ENDOSCOPY;  Service: Cardiovascular;  Laterality: N/A;  10a CV  . TEE WITHOUT CARDIOVERSION  06/24/2012   Procedure: TRANSESOPHAGEAL ECHOCARDIOGRAM (TEE);  Surgeon: [name], MD;  Location: [facility] ENDOSCOPY;  Service: Cardiovascular;  Laterality: N/A;  . TEE WITHOUT CARDIOVERSION N/A 08/22/2017   Procedure: TRANSESOPHAGEAL ECHOCARDIOGRAM (TEE);  Surgeon: Chilton Si, MD;  Location: [facility] ENDOSCOPY;  Service: Cardiovascular;  Laterality: N/A;  . UMBILICAL HERNIA REPAIR     x 2    ROS- all systems are reviewed and negatives except as per HPI above  Current Outpatient  Medications  Medication Sig Dispense Refill  . amLODipine (NORVASC) 5 MG tablet TAKE ONE TABLET BY MOUTH ONCE DAILY 30 tablet 1  . ferrous sulfate 325 (65 FE) MG tablet Take 325 mg by mouth daily with breakfast.    . folic acid (FOLVITE) 1 MG tablet Take 1 tablet (1 mg total) by mouth daily. 100 tablet 3  . furosemide (LASIX) 40 MG tablet Take 20 mg by mouth daily.    Marland Kitchen ibuprofen (ADVIL,MOTRIN) 200 MG tablet Take 400 mg by mouth every 8 (eight) hours as needed (for pain.).    Marland Kitchen losartan (COZAAR) 100 MG tablet Take 0.5 tablets (50 mg total) by mouth 2 (two) times daily. 90 tablet 3  . Methylcellulose, Laxative, (CITRUCEL) 500 MG TABS Take 500 mg by mouth daily.    . metoprolol tartrate (LOPRESSOR) 50 MG tablet Take 0.5 tablets (25 mg total) by mouth 2 (two) times daily. 30 tablet 6  . Multiple Vitamin (MULTIVITAMIN) tablet Take 1-2 tablets by mouth 2 (two) times daily. Take 2 tablets in the morning and 1 tablet in the evening    . omeprazole (PRILOSEC) 40 MG capsule Take 40 mg by mouth every morning.   1  . spironolactone (ALDACTONE) 50 MG tablet TAKE ONE TABLET BY MOUTH ONCE DAILY 30 tablet 1  . vitamin E (VITAMIN E) 400 UNIT capsule Take 400 Units by mouth daily.    [name] Hurl 20 MG TABS tablet TAKE ONE TABLET BY MOUTH DAILY WITH  SUPPER 30 tablet 1   No current facility-administered medications for this visit.    Facility-Administered Medications Ordered in Other Visits  Medication Dose Route Frequency Provider Last Rate Last Dose  . gadopentetate dimeglumine (MAGNEVIST) injection 20 mL  20 mL Intravenous Once PRN Anson Fret, MD        Physical Exam: Vitals:   09/10/17 1228  BP: 122/64  Pulse: (!) 127  Weight: 212 lb (96.2 kg)  Height: 5\' 6"  (1.676 m)    GEN- The patient is well appearing, alert and oriented x 3 today.   Head- normocephalic, atraumatic Eyes-  Sclera clear, conjunctiva pink Ears- hearing intact Oropharynx- clear Lungs- Clear to ausculation bilaterally,  normal work of breathing Heart- irregular rate and rhythm, no murmurs, rubs or gallops, PMI not laterally displaced GI- soft, NT, ND, + BS Extremities- no clubbing, cyanosis, or edema  EKG tracing ordered today is personally reviewed and shows afib, V rate 127 bpm, incomplete RBBB  Assessment and Plan:  1. Persistent afib Failed cardioversion On xarelto Therapeutic strategies for afib including medicine (flecainide, amiodarone) and ablation were discussed in detail with the patient today. He did not tolerate tikosyn due to qt prolongation previously.  Risk, benefits, and alternatives to EP study and radiofrequency ablation for afib were also discussed in detail today. These risks include but are not limited to stroke, bleeding, vascular damage, tamponade, perforation, damage to the esophagus, lungs, and other structures, pulmonary vein stenosis, worsening renal function, and death. The patient understands these risk and wishes to proceed.  We will therefore proceed with catheter ablation at the next available time.  Will plan cardiac  CT prior to ablation.  2. Nonischemic CM EF has previously recovered with sinus rhythm I worry about elevated V rates going forward.  He would do better with sinus rhythm Increase metoprolol to 50mg  BID today.   He will follow heart rates on his apple watch.  3. Obesity Body mass index is 34.22 kg/m.  S/p bariatric surgery Lifestyle modification is encouraged  4. HTN Stable No change required today   Hillis Range MD, [facility] 09/10/2017 12:49 PM

## 2017-09-11 ENCOUNTER — Other Ambulatory Visit: Payer: Self-pay | Admitting: *Deleted

## 2017-09-11 DIAGNOSIS — I4819 Other persistent atrial fibrillation: Secondary | ICD-10-CM

## 2017-09-19 ENCOUNTER — Telehealth: Payer: Self-pay | Admitting: Internal Medicine

## 2017-09-19 MED ORDER — METOPROLOL TARTRATE 25 MG PO TABS: 75.0000 mg | ORAL_TABLET | Freq: Two times a day (BID) | ORAL | 3 refills | Status: DC

## 2017-09-19 NOTE — Telephone Encounter (Signed)
Tyrone [name] bis calling to let you know that his heart rate is still not under a 100. Still running at 130. Medication has been change , not sure what he is to do . Please call

## 2017-09-19 NOTE — Telephone Encounter (Signed)
Per Dr. [name], Pt may increase metoprolol tartrate to 75 mg BID. Call returned to Pt.  New instruction given.  Pt needs refill.  Will send to [facility] pharmacy as 25 mg tabs d/t patient may not need increased amount after ablation.

## 2017-09-24 ENCOUNTER — Ambulatory Visit: Payer: BLUE CROSS/BLUE SHIELD | Admitting: Internal Medicine

## 2017-09-27 ENCOUNTER — Other Ambulatory Visit: Payer: BLUE CROSS/BLUE SHIELD | Admitting: *Deleted

## 2017-09-27 ENCOUNTER — Telehealth: Payer: Self-pay

## 2017-09-27 DIAGNOSIS — I4819 Other persistent atrial fibrillation: Secondary | ICD-10-CM

## 2017-09-27 DIAGNOSIS — I481 Persistent atrial fibrillation: Secondary | ICD-10-CM | POA: Diagnosis not present

## 2017-09-27 LAB — CBC WITH DIFFERENTIAL/PLATELET
Basophils Absolute: 0 10*3/uL (ref 0.0–0.2)
Basos: 1 %
EOS (ABSOLUTE): 0.1 10*3/uL (ref 0.0–0.4)
Eos: 2 %
Hematocrit: 38.4 % (ref 37.5–51.0)
Hemoglobin: 13 g/dL (ref 13.0–17.7)
Immature Grans (Abs): 0 10*3/uL (ref 0.0–0.1)
Immature Granulocytes: 0 %
Lymphocytes Absolute: 1.8 10*3/uL (ref 0.7–3.1)
Lymphs: 35 %
MCH: 33.8 pg — ABNORMAL HIGH (ref 26.6–33.0)
MCHC: 33.9 g/dL (ref 31.5–35.7)
MCV: 100 fL — ABNORMAL HIGH (ref 79–97)
Monocytes Absolute: 0.4 10*3/uL (ref 0.1–0.9)
Monocytes: 8 %
Neutrophils Absolute: 2.7 10*3/uL (ref 1.4–7.0)
Neutrophils: 54 %
Platelets: 167 10*3/uL (ref 150–379)
RBC: 3.85 x10E6/uL — ABNORMAL LOW (ref 4.14–5.80)
RDW: 14.2 % (ref 12.3–15.4)
WBC: 5.1 10*3/uL (ref 3.4–10.8)

## 2017-09-27 LAB — BASIC METABOLIC PANEL
BUN/Creatinine Ratio: 14 (ref 9–20)
BUN: 11 mg/dL (ref 6–24)
CO2: 26 mmol/L (ref 20–29)
Calcium: 8.7 mg/dL (ref 8.7–10.2)
Chloride: 104 mmol/L (ref 96–106)
Creatinine, Ser: 0.76 mg/dL (ref 0.76–1.27)
GFR calc Af Amer: 121 mL/min/{1.73_m2} (ref >59)
GFR calc non Af Amer: 105 mL/min/{1.73_m2} (ref >59)
Glucose: 99 mg/dL (ref 65–99)
Potassium: 4.4 mmol/L (ref 3.5–5.2)
Sodium: 142 mmol/L (ref 134–144)

## 2017-09-27 NOTE — Telephone Encounter (Signed)
Pt arrived to [facility] office today for pre procedure lab work.  At arrival Pt notified registration that his pulse has continued to be very high running 120-160's even after increase in metoprolol to 75 mg bid.  Spoke with Dr. [name].  Advised that Pt could increase his metoprolol to 100 mg bid.  Notified Pt that we may need to change his preop work up from a cardiac CT to a TEE-Dr. Allred and Dr. Delton See to discuss.  Will advise Pt tomorrow of decision.  Pt indicates understanding.  Will continue to monitor.

## 2017-09-28 MED ORDER — METOPROLOL TARTRATE 25 MG PO TABS: 100.0000 mg | ORAL_TABLET | Freq: Two times a day (BID) | ORAL | 3 refills | Status: DC

## 2017-09-28 NOTE — Telephone Encounter (Signed)
Per Dr. Delton See, ok for Pt to have a cardiac CT to evaluate pulmonary vein even with high heart rates.   Call placed to Pt.  Notified that ok to continue with current plan.  Advised to call office if any concerns.

## 2017-10-02 ENCOUNTER — Encounter (HOSPITAL_COMMUNITY): Payer: Self-pay

## 2017-10-02 ENCOUNTER — Ambulatory Visit (HOSPITAL_COMMUNITY)
Admission: RE | Admit: 2017-10-02 | Discharge: 2017-10-02 | Disposition: A | Discharge status: Home / Self Care | Payer: BLUE CROSS/BLUE SHIELD | Source: Ambulatory Visit | Attending: Internal Medicine | Admitting: Internal Medicine

## 2017-10-02 ENCOUNTER — Ambulatory Visit (HOSPITAL_COMMUNITY): Admission: RE | Admit: 2017-10-02 | Payer: BLUE CROSS/BLUE SHIELD | Source: Ambulatory Visit

## 2017-10-02 DIAGNOSIS — I481 Persistent atrial fibrillation: Secondary | ICD-10-CM | POA: Diagnosis not present

## 2017-10-02 DIAGNOSIS — R911 Solitary pulmonary nodule: Secondary | ICD-10-CM | POA: Diagnosis not present

## 2017-10-02 DIAGNOSIS — I4819 Other persistent atrial fibrillation: Secondary | ICD-10-CM

## 2017-10-02 DIAGNOSIS — I4891 Unspecified atrial fibrillation: Secondary | ICD-10-CM | POA: Diagnosis not present

## 2017-10-02 MED ORDER — METOPROLOL TARTRATE 5 MG/5ML IV SOLN
INTRAVENOUS | Status: AC
  Administered 2017-10-02: 5 mg via INTRAVENOUS
  Filled 2017-10-02: qty 5

## 2017-10-02 MED ORDER — IOPAMIDOL (ISOVUE-370) INJECTION 76%
INTRAVENOUS | Status: AC
  Administered 2017-10-02: 80 mL
  Filled 2017-10-02: qty 100

## 2017-10-02 MED ORDER — METOPROLOL TARTRATE 5 MG/5ML IV SOLN
5.0000 mg | Freq: Once | INTRAVENOUS | Status: AC
  Administered 2017-10-02: 5 mg via INTRAVENOUS
  Filled 2017-10-02: qty 5

## 2017-10-04 ENCOUNTER — Ambulatory Visit (HOSPITAL_COMMUNITY)
Admission: RE | Admit: 2017-10-04 | Discharge: 2017-10-05 | Disposition: A | Discharge status: Home / Self Care | Payer: BLUE CROSS/BLUE SHIELD | Source: Ambulatory Visit | Attending: Internal Medicine | Admitting: Internal Medicine

## 2017-10-04 ENCOUNTER — Encounter (HOSPITAL_COMMUNITY)
Admission: RE | Disposition: A | Discharge status: Home / Self Care | Payer: Self-pay | Source: Ambulatory Visit | Attending: Internal Medicine

## 2017-10-04 ENCOUNTER — Ambulatory Visit (HOSPITAL_COMMUNITY): Payer: BLUE CROSS/BLUE SHIELD | Admitting: Certified Registered Nurse Anesthetist

## 2017-10-04 ENCOUNTER — Encounter (HOSPITAL_COMMUNITY): Payer: Self-pay | Admitting: Certified Registered Nurse Anesthetist

## 2017-10-04 DIAGNOSIS — M1990 Unspecified osteoarthritis, unspecified site: Secondary | ICD-10-CM | POA: Insufficient documentation

## 2017-10-04 DIAGNOSIS — D61818 Other pancytopenia: Secondary | ICD-10-CM | POA: Diagnosis not present

## 2017-10-04 DIAGNOSIS — E669 Obesity, unspecified: Secondary | ICD-10-CM | POA: Diagnosis not present

## 2017-10-04 DIAGNOSIS — Z9884 Bariatric surgery status: Secondary | ICD-10-CM | POA: Diagnosis not present

## 2017-10-04 DIAGNOSIS — I10 Essential (primary) hypertension: Secondary | ICD-10-CM | POA: Insufficient documentation

## 2017-10-04 DIAGNOSIS — Z7901 Long term (current) use of anticoagulants: Secondary | ICD-10-CM | POA: Insufficient documentation

## 2017-10-04 DIAGNOSIS — I4891 Unspecified atrial fibrillation: Secondary | ICD-10-CM | POA: Diagnosis not present

## 2017-10-04 DIAGNOSIS — Z6834 Body mass index (BMI) 34.0-34.9, adult: Secondary | ICD-10-CM | POA: Insufficient documentation

## 2017-10-04 DIAGNOSIS — I428 Other cardiomyopathies: Secondary | ICD-10-CM | POA: Insufficient documentation

## 2017-10-04 DIAGNOSIS — Z8673 Personal history of transient ischemic attack (TIA), and cerebral infarction without residual deficits: Secondary | ICD-10-CM | POA: Diagnosis not present

## 2017-10-04 DIAGNOSIS — K219 Gastro-esophageal reflux disease without esophagitis: Secondary | ICD-10-CM | POA: Diagnosis not present

## 2017-10-04 DIAGNOSIS — I481 Persistent atrial fibrillation: Secondary | ICD-10-CM | POA: Insufficient documentation

## 2017-10-04 DIAGNOSIS — I739 Peripheral vascular disease, unspecified: Secondary | ICD-10-CM | POA: Insufficient documentation

## 2017-10-04 DIAGNOSIS — I4819 Other persistent atrial fibrillation: Secondary | ICD-10-CM | POA: Diagnosis present

## 2017-10-04 DIAGNOSIS — G4730 Sleep apnea, unspecified: Secondary | ICD-10-CM | POA: Insufficient documentation

## 2017-10-04 HISTORY — PX: ATRIAL FIBRILLATION ABLATION: EP1191

## 2017-10-04 LAB — POCT ACTIVATED CLOTTING TIME
Activated Clotting Time: 213 seconds
Activated Clotting Time: 230 seconds
Activated Clotting Time: 290 seconds
Activated Clotting Time: 230 seconds
Activated Clotting Time: 213 seconds
Activated Clotting Time: 197 seconds

## 2017-10-04 SURGERY — ATRIAL FIBRILLATION ABLATION
Anesthesia: General

## 2017-10-04 MED ORDER — LOSARTAN POTASSIUM 50 MG PO TABS: 50.0000 mg | ORAL_TABLET | Freq: Two times a day (BID) | ORAL | Status: DC

## 2017-10-04 MED ORDER — ONDANSETRON HCL 4 MG/2ML IJ SOLN
INTRAMUSCULAR | Status: DC | PRN
  Administered 2017-10-04: 4 mg via INTRAVENOUS

## 2017-10-04 MED ORDER — HEPARIN (PORCINE) IN NACL 2-0.9 UNIT/ML-% IJ SOLN
INTRAMUSCULAR | Status: AC | PRN
  Administered 2017-10-04: 500 mL

## 2017-10-04 MED ORDER — SODIUM CHLORIDE 0.9% FLUSH: 3.0000 mL | INTRAVENOUS | Status: DC | PRN

## 2017-10-04 MED ORDER — SODIUM CHLORIDE 0.9 % IV SOLN
INTRAVENOUS | Status: DC
  Administered 2017-10-04 (×3): via INTRAVENOUS

## 2017-10-04 MED ORDER — FUROSEMIDE 20 MG PO TABS: 20.0000 mg | ORAL_TABLET | Freq: Every day | ORAL | Status: DC

## 2017-10-04 MED ORDER — BUPIVACAINE HCL (PF) 0.25 % IJ SOLN
INTRAMUSCULAR | Status: DC | PRN
  Administered 2017-10-04: 30 mL

## 2017-10-04 MED ORDER — SODIUM CHLORIDE 0.9 % IV SOLN: 250.0000 mL | INTRAVENOUS | Status: DC | PRN

## 2017-10-04 MED ORDER — PROTAMINE SULFATE 10 MG/ML IV SOLN
INTRAVENOUS | Status: DC | PRN
  Administered 2017-10-04: 30 mg via INTRAVENOUS

## 2017-10-04 MED ORDER — ACETAMINOPHEN 325 MG PO TABS: 650.0000 mg | ORAL_TABLET | ORAL | Status: DC | PRN

## 2017-10-04 MED ORDER — IOPAMIDOL (ISOVUE-370) INJECTION 76%
INTRAVENOUS | Status: DC | PRN
  Administered 2017-10-04: 3 mL

## 2017-10-04 MED ORDER — HEPARIN SODIUM (PORCINE) 1000 UNIT/ML IJ SOLN
INTRAMUSCULAR | Status: DC | PRN
  Administered 2017-10-04: 1000 [IU] via INTRAVENOUS
  Administered 2017-10-04: 12000 [IU] via INTRAVENOUS

## 2017-10-04 MED ORDER — MIDAZOLAM HCL 5 MG/5ML IJ SOLN
INTRAMUSCULAR | Status: DC | PRN
  Administered 2017-10-04 (×2): 2 mg via INTRAVENOUS

## 2017-10-04 MED ORDER — HEPARIN SODIUM (PORCINE) 1000 UNIT/ML IJ SOLN
INTRAMUSCULAR | Status: DC | PRN
  Administered 2017-10-04: 7000 [IU] via INTRAVENOUS
  Administered 2017-10-04 (×2): 5000 [IU] via INTRAVENOUS

## 2017-10-04 MED ORDER — FENTANYL CITRATE (PF) 100 MCG/2ML IJ SOLN
INTRAMUSCULAR | Status: DC | PRN
  Administered 2017-10-04: 25 ug via INTRAVENOUS
  Administered 2017-10-04: 50 ug via INTRAVENOUS
  Administered 2017-10-04: 25 ug via INTRAVENOUS

## 2017-10-04 MED ORDER — SODIUM CHLORIDE 0.9% FLUSH
3.0000 mL | Freq: Two times a day (BID) | INTRAVENOUS | Status: DC
  Administered 2017-10-04: 3 mL via INTRAVENOUS

## 2017-10-04 MED ORDER — LIDOCAINE 2% (20 MG/ML) 5 ML SYRINGE
INTRAMUSCULAR | Status: DC | PRN
  Administered 2017-10-04: 60 mg via INTRAVENOUS

## 2017-10-04 MED ORDER — SPIRONOLACTONE 25 MG PO TABS: 50.0000 mg | ORAL_TABLET | Freq: Every day | ORAL | Status: DC

## 2017-10-04 MED ORDER — HEPARIN (PORCINE) IN NACL 2-0.9 UNIT/ML-% IJ SOLN
INTRAMUSCULAR | Status: AC
  Filled 2017-10-04: qty 500

## 2017-10-04 MED ORDER — AMLODIPINE BESYLATE 5 MG PO TABS: 5.0000 mg | ORAL_TABLET | Freq: Every day | ORAL | Status: DC

## 2017-10-04 MED ORDER — BUPIVACAINE HCL (PF) 0.25 % IJ SOLN
INTRAMUSCULAR | Status: AC
  Filled 2017-10-04: qty 30

## 2017-10-04 MED ORDER — PROPOFOL 10 MG/ML IV BOLUS
INTRAVENOUS | Status: DC | PRN
  Administered 2017-10-04: 200 mg via INTRAVENOUS
  Administered 2017-10-04: 50 mg via INTRAVENOUS

## 2017-10-04 MED ORDER — ONDANSETRON HCL 4 MG/2ML IJ SOLN: 4.0000 mg | Freq: Four times a day (QID) | INTRAMUSCULAR | Status: DC | PRN

## 2017-10-04 MED ORDER — ADENOSINE 6 MG/2ML IV SOLN
INTRAVENOUS | Status: AC
  Filled 2017-10-04: qty 4

## 2017-10-04 MED ORDER — HEPARIN SODIUM (PORCINE) 1000 UNIT/ML IJ SOLN
INTRAMUSCULAR | Status: AC
  Filled 2017-10-04: qty 1

## 2017-10-04 MED ORDER — IOPAMIDOL (ISOVUE-370) INJECTION 76%
INTRAVENOUS | Status: AC
  Filled 2017-10-04: qty 50

## 2017-10-04 MED ORDER — RIVAROXABAN 20 MG PO TABS
20.0000 mg | ORAL_TABLET | Freq: Every day | ORAL | Status: DC
  Administered 2017-10-04: 20 mg via ORAL
  Filled 2017-10-04: qty 1

## 2017-10-04 MED ORDER — HYDROCODONE-ACETAMINOPHEN 5-325 MG PO TABS: 1.0000 | ORAL_TABLET | ORAL | Status: DC | PRN

## 2017-10-04 MED ORDER — ADENOSINE 6 MG/2ML IV SOLN
INTRAVENOUS | Status: DC | PRN
  Administered 2017-10-04 (×2): 12 mg via INTRAVENOUS

## 2017-10-04 MED ORDER — PHENYLEPHRINE HCL 10 MG/ML IJ SOLN
INTRAMUSCULAR | Status: DC | PRN
  Administered 2017-10-04 (×8): 80 ug via INTRAVENOUS

## 2017-10-04 SURGICAL SUPPLY — 20 items
BLANKET WARM UNDERBOD FULL ACC (MISCELLANEOUS) ×2 IMPLANT
CATH MAPPNG PENTARAY F 2-6-2MM (CATHETERS) IMPLANT
CATH NAVISTAR SMARTTOUCH DF (ABLATOR) ×1 IMPLANT
CATH SOUNDSTAR 3D IMAGING (CATHETERS) ×1 IMPLANT
CATH WEBSTER BI DIR CS D-F CRV (CATHETERS) ×1 IMPLANT
COVER SWIFTLINK CONNECTOR (BAG) ×1 IMPLANT
NDL BAYLIS TRANSSEPTAL 71CM (NEEDLE) IMPLANT
NDL TRANSEP BRK 71CM 407200 (NEEDLE) IMPLANT
NEEDLE BAYLIS TRANSSEPTAL 71CM (NEEDLE) ×2 IMPLANT
NEEDLE TRANSEP BRK 71CM 407200 (NEEDLE) ×2 IMPLANT
PACK EP LATEX FREE (CUSTOM PROCEDURE TRAY) ×2
PACK EP LF (CUSTOM PROCEDURE TRAY) ×1 IMPLANT
PAD DEFIB LIFELINK (PAD) ×2 IMPLANT
PATCH CARTO3 (PAD) ×1 IMPLANT
PENTARAY F 2-6-2MM (CATHETERS) ×2
SHEATH AVANTI 11CM 7FR (SHEATH) ×2 IMPLANT
SHEATH AVANTI 11CM 9FR (SHEATH) ×1 IMPLANT
SHEATH AVANTI 11F 11CM (SHEATH) ×1 IMPLANT
SHEATH SWARTZ TS SL2 63CM 8.5F (SHEATH) ×1 IMPLANT
TUBING SMART ABLATE COOLFLOW (TUBING) ×1 IMPLANT

## 2017-10-04 NOTE — Progress Notes (Signed)
Site area: rt groin fv sheaths x3 Site Prior to Removal:  Level 0 Pressure Applied For:  20 minutes Manual:   yes Patient Status During Pull:  stable Post Pull Site:  Level  0 Post Pull Instructions Given:  yes Post Pull Pulses Present: palpable rt dp Dressing Applied:  Gauze and tegaderm Bedrest begins @ 1630 Comments:  IV saline locked

## 2017-10-04 NOTE — Discharge Summary (Addendum)
ELECTROPHYSIOLOGY PROCEDURE DISCHARGE SUMMARY    Patient ID: Tyrone [name],  MRN: [id number], DOB/AGE: 1965/10/02 52 y.o.  Admit date: 10/04/2017 Discharge date: 10/05/2017  Primary Care Physician: Porfirio Oar, PA-C Primary Cardiologist: Hochrein Electrophysiologist: Hillis Range, MD  Primary Discharge Diagnosis:  Persistent atrial fibrillation status post ablation this admission  Secondary Discharge Diagnosis:  1.  Prior CVA 2.  HTN 3.  Obesity s/p gastric bypass 4.  GERD  Procedures This Admission:  1.  Electrophysiology study and radiofrequency catheter ablation on 10/04/17 by Dr Hillis Range.  This study demonstrated atrial fibrillation upon presentation; intracardiac echo reveals a moderate sized left atrium with four separate pulmonary veins without evidence of pulmonary vein stenosis; return of electrical conduction within the right superior pulmonary vein, along the anterior portion.  There was also conduction along the carina between the RSPV and RIPV.  There was trivial conduction within the left superior pulmonary vein.  As the patient's prior ablation was an antral procedure, I elected to perform WACA of all four PVs; successful electrical re- isolation and anatomical encircling of all four pulmonary veins with radiofrequency current.  A WACA approach was used; additional left atrial ablation was performed with a standard box lesion created along the posterior wall of the left atrium; atrial fibrillation successfully cardioverted to sinus rhythm; no early apparent complications..    Brief HPI: Tyrone [name] is a 52 y.o. male with a history of persistent atrial fibrillation.  They have failed medical therapy with metoprolol. Risks, benefits, and alternatives to catheter ablation of atrial fibrillation were reviewed with the patient who wished to proceed.  The patient underwent cardiac CT prior to the procedure which demonstrated no LAA thrombus.    Hospital Course:  The  patient was admitted and underwent EPS/RFCA of atrial fibrillation with details as outlined above.  They were monitored on telemetry overnight which demonstrated sinus rhythm.  Groin was without complication on the day of discharge.  The patient was examined and considered to be stable for discharge.  Wound care and restrictions were reviewed with the patient.  The patient will be seen back by [name], NP in 4 weeks and Dr [name] in 12 weeks for post ablation follow up.   This patients CHA2DS2-VASc Score and unadjusted Ischemic Stroke Rate (% per year) is equal to 3.2 % stroke rate/year from a score of 3 Above score calculated as 1 point each if present [CHF, HTN, DM, Vascular=MI/PAD/Aortic Plaque, Age if 65-74, or Male] Above score calculated as 2 points each if present [Age > 75, or Stroke/TIA/TE]   Physical Exam: Vitals:   10/04/17 1914 10/04/17 1944 10/04/17 2218 10/05/17 0603  BP: 108/75 104/76 105/82 129/78  Pulse: 62 70 (!) 58 69  Resp:      Temp:   98.3 F (36.8 C) 98.3 F (36.8 C)  TempSrc:   Oral Oral  SpO2: 98% 97%  98%  Weight:    212 lb 1.3 oz (96.2 kg)  Height:        GEN- The patient is well appearing, alert and oriented x 3 today.   HEENT: normocephalic, atraumatic; sclera clear, conjunctiva pink; hearing intact; oropharynx clear; neck supple  Lungs- Clear to ausculation bilaterally, normal work of breathing.  No wheezes, rales, rhonchi Heart- Regular rate and rhythm, no murmurs, rubs or gallops  GI- soft, non-tender, non-distended, bowel sounds present  Extremities- no clubbing, cyanosis, or edema; DP/PT/radial pulses 2+ bilaterally, groin without hematoma/bruit MS- no significant deformity or atrophy  Skin- warm and dry, no rash or lesion Psych- euthymic mood, full affect Neuro- strength and sensation are intact   Labs:   Lab Results  Component Value Date   WBC 5.1 09/27/2017   HGB 13.0 09/27/2017   HCT 38.4 09/27/2017   MCV 100 (H) 09/27/2017   PLT  167 09/27/2017   No results for input(s): NA, K, CL, CO2, BUN, CREATININE, CALCIUM, PROT, BILITOT, ALKPHOS, ALT, AST, GLUCOSE in the last 168 hours.  Invalid input(s): LABALBU   Discharge Medications:  Allergies as of 10/05/2017   No Known Allergies     Medication List    TAKE these medications   amLODipine 5 MG tablet Commonly known as:  NORVASC TAKE ONE TABLET BY MOUTH ONCE DAILY What changed:    how much to take  how to take this  when to take this   CITRUCEL 500 MG Tabs Generic drug:  Methylcellulose (Laxative) Take 500 mg by mouth daily.   ferrous sulfate 325 (65 FE) MG tablet Take 325 mg by mouth daily with breakfast.   folic acid 1 MG tablet Commonly known as:  FOLVITE Take 1 tablet (1 mg total) by mouth daily. What changed:  when to take this   furosemide 40 MG tablet Commonly known as:  LASIX Take 20 mg by mouth daily.   ibuprofen 200 MG tablet Commonly known as:  ADVIL,MOTRIN Take 400 mg by mouth daily as needed for moderate pain.   losartan 100 MG tablet Commonly known as:  COZAAR Take 0.5 tablets (50 mg total) by mouth 2 (two) times daily.   metoprolol tartrate 25 MG tablet Commonly known as:  LOPRESSOR Take 4 tablets (100 mg total) by mouth 2 (two) times daily.   multivitamin tablet Take 1-2 tablets by mouth See admin instructions. Take 2 tablets in the morning and 1 tablet in the evening   omeprazole 40 MG capsule Commonly known as:  PRILOSEC Take 40 mg by mouth daily.   spironolactone 50 MG tablet Commonly known as:  ALDACTONE TAKE ONE TABLET BY MOUTH ONCE DAILY   trolamine salicylate 10 % cream Commonly known as:  ASPERCREME Apply 1 application topically as needed for muscle pain.   vitamin E 400 UNIT capsule Generic drug:  vitamin E Take 400 Units by mouth daily.   XARELTO 20 MG Tabs tablet Generic drug:  rivaroxaban TAKE ONE TABLET BY MOUTH DAILY WITH  SUPPER What changed:  See the new instructions.       Disposition:    Discharge Instructions    Diet - low sodium heart healthy   Complete by:  As directed    Increase activity slowly   Complete by:  As directed      Follow-up Information    [facility] Follow up on 11/05/2017.   Specialty:  Cardiology Why:  at 8:30AM Contact information: [address] [id number] mc [address] [place] [address] [phone]       Hillis Range, MD Follow up on 01/14/2018.   Specialty:  Cardiology Why:  at 9:45AM Contact information: [address] [place] [address] [phone]           Duration of Discharge Encounter: Greater than 30 minutes including physician time.  Signed, Gypsy Balsam, NP 10/05/2017 6:25 AM     Co Sign: Hillis Range, MD 10/05/2017 10:44 AM

## 2017-10-04 NOTE — Discharge Instructions (Signed)
No driving for 4 days. No lifting over 5 lbs for 1 week. No sexual activity for 1 week. You may return to work in 1 week. Keep procedure site clean & dry. If you notice increased pain, swelling, bleeding or pus, call/return!  You may shower, but no soaking baths/hot tubs/pools for 1 week.  ° ° °You have an appointment set up with the Atrial Fibrillation Clinic.  Multiple studies have shown that being followed by a dedicated atrial fibrillation clinic in addition to the standard care you receive from your other physicians improves health. We believe that enrollment in the atrial fibrillation clinic will allow us to better care for you.  ° °The phone number to the Atrial Fibrillation Clinic is [phone]. The clinic is staffed Monday through Friday from 8:30am to 5pm. ° °Parking Directions: The clinic is located in the Heart and Vascular Building connected to [facility]. °1)From [address] turn on to [address] and go to the 3rd entrance  (Heart and Vascular entrance) on the right. °2)Look to the right for Heart &Vascular Parking Garage. °3)A code for the entrance is required please call the clinic to receive this.   °4)Take the elevators to the 1st floor. Registration is in the room with the glass walls at the end of the hallway. ° °If you have any trouble parking or locating the clinic, please don’t hesitate to call [phone]. ° ° °

## 2017-10-04 NOTE — Transfer of Care (Signed)
Immediate Anesthesia Transfer of Care Note  Patient: Tyrone [name]  Procedure(s) Performed: ATRIAL FIBRILLATION ABLATION (N/A )  Patient Location: Cath Lab  Anesthesia Type:General  Level of Consciousness: awake, alert  and oriented  Airway & Oxygen Therapy: Patient Spontanous Breathing and Patient connected to nasal cannula oxygen  Post-op Assessment: VSS  Post vital signs: Reviewed and stable  Last Vitals:  Vitals:   10/04/17 0854 10/04/17 1412  BP: (!) 133/96   Pulse: (!) 116   Resp: 18   Temp: 36.8 C 36.7 C  SpO2: 97%     Last Pain:  Vitals:   10/04/17 1412  TempSrc: Temporal         Complications: No apparent anesthesia complications

## 2017-10-04 NOTE — Anesthesia Procedure Notes (Signed)
Procedure Name: LMA Insertion Date/Time: 10/04/2017 11:11 AM Performed by: Rachel Moulds, CRNA Pre-anesthesia Checklist: Patient identified, Emergency Drugs available, Suction available, Patient being monitored and Timeout performed Patient Re-evaluated:Patient Re-evaluated prior to induction Oxygen Delivery Method: Circle system utilized Preoxygenation: Pre-oxygenation with 100% oxygen Induction Type: IV induction Ventilation: Two handed mask ventilation required LMA: LMA inserted LMA Size: 5.0 Number of attempts: 1 Placement Confirmation: positive ETCO2,  breath sounds checked- equal and bilateral and CO2 detector Tube secured with: Tape Dental Injury: Teeth and Oropharynx as per pre-operative assessment

## 2017-10-04 NOTE — Interval H&P Note (Signed)
History and Physical Interval Note:  10/04/2017 11:06 AM  Launa Flight  has presented today for surgery, with the diagnosis of afib  The various methods of treatment have been discussed with the patient and family. After consideration of risks, benefits and other options for treatment, the patient has consented to  Procedure(s): ATRIAL FIBRILLATION ABLATION (N/A) as a surgical intervention .  The patient's history has been reviewed, patient examined, no change in  status, stable for surgery.  I have reviewed the patient's chart and labs.  Questions were answered to the patient's satisfaction.    He reports compliance with xarelto without interruption.  Cardiac CT findings discussed at length with the patient today.   Hillis Range

## 2017-10-04 NOTE — Anesthesia Preprocedure Evaluation (Signed)
Anesthesia Evaluation  Patient identified by MRN, date of birth, ID band Patient awake    Reviewed: Allergy & Precautions, H&P , NPO status , Patient's Chart, lab work & pertinent test results  Airway Mallampati: II   Neck ROM: full    Dental   Pulmonary sleep apnea ,    breath sounds clear to auscultation       Cardiovascular hypertension, + Peripheral Vascular Disease  + dysrhythmias Atrial Fibrillation  Rhythm:irregular Rate:Normal     Neuro/Psych H/o Guillan-Barre  Neuromuscular disease CVA    GI/Hepatic GERD  ,  Endo/Other    Renal/GU      Musculoskeletal  (+) Arthritis ,   Abdominal   Peds  Hematology   Anesthesia Other Findings   Reproductive/Obstetrics                             Anesthesia Physical Anesthesia Plan  ASA: III  Anesthesia Plan: General   Post-op Pain Management:    Induction: Intravenous  PONV Risk Score and Plan: 2 and Ondansetron and Treatment may vary due to age or medical condition  Airway Management Planned: LMA  Additional Equipment:   Intra-op Plan:   Post-operative Plan:   Informed Consent: I have reviewed the patients History and Physical, chart, labs and discussed the procedure including the risks, benefits and alternatives for the proposed anesthesia with the patient or authorized representative who has indicated his/her understanding and acceptance.     Plan Discussed with: CRNA, Anesthesiologist and Surgeon  Anesthesia Plan Comments:         Anesthesia Quick Evaluation

## 2017-10-05 ENCOUNTER — Encounter (HOSPITAL_COMMUNITY): Payer: Self-pay | Admitting: Internal Medicine

## 2017-10-05 DIAGNOSIS — I10 Essential (primary) hypertension: Secondary | ICD-10-CM | POA: Diagnosis not present

## 2017-10-05 DIAGNOSIS — I481 Persistent atrial fibrillation: Secondary | ICD-10-CM | POA: Diagnosis not present

## 2017-10-05 DIAGNOSIS — I739 Peripheral vascular disease, unspecified: Secondary | ICD-10-CM | POA: Diagnosis not present

## 2017-10-05 DIAGNOSIS — E669 Obesity, unspecified: Secondary | ICD-10-CM | POA: Diagnosis not present

## 2017-10-05 DIAGNOSIS — Z9884 Bariatric surgery status: Secondary | ICD-10-CM | POA: Diagnosis not present

## 2017-10-05 DIAGNOSIS — I428 Other cardiomyopathies: Secondary | ICD-10-CM | POA: Diagnosis not present

## 2017-10-05 DIAGNOSIS — Z6834 Body mass index (BMI) 34.0-34.9, adult: Secondary | ICD-10-CM | POA: Diagnosis not present

## 2017-10-05 DIAGNOSIS — D61818 Other pancytopenia: Secondary | ICD-10-CM | POA: Diagnosis not present

## 2017-10-05 DIAGNOSIS — K219 Gastro-esophageal reflux disease without esophagitis: Secondary | ICD-10-CM | POA: Diagnosis not present

## 2017-10-05 DIAGNOSIS — Z8673 Personal history of transient ischemic attack (TIA), and cerebral infarction without residual deficits: Secondary | ICD-10-CM | POA: Diagnosis not present

## 2017-10-05 DIAGNOSIS — Z7901 Long term (current) use of anticoagulants: Secondary | ICD-10-CM | POA: Diagnosis not present

## 2017-10-05 DIAGNOSIS — G4730 Sleep apnea, unspecified: Secondary | ICD-10-CM | POA: Diagnosis not present

## 2017-10-05 DIAGNOSIS — M1990 Unspecified osteoarthritis, unspecified site: Secondary | ICD-10-CM | POA: Diagnosis not present

## 2017-10-22 NOTE — Anesthesia Postprocedure Evaluation (Signed)
Anesthesia Post Note  Patient: Tyrone [name]  Procedure(s) Performed: ATRIAL FIBRILLATION ABLATION (N/A )     Patient location during evaluation: PACU Anesthesia Type: General Level of consciousness: awake and alert Pain management: pain level controlled Vital Signs Assessment: post-procedure vital signs reviewed and stable Respiratory status: spontaneous breathing, nonlabored ventilation, respiratory function stable and patient connected to nasal cannula oxygen Cardiovascular status: blood pressure returned to baseline and stable Postop Assessment: no apparent nausea or vomiting Anesthetic complications: no    Last Vitals:  Vitals:   10/04/17 2218 10/05/17 0603  BP: 105/82 129/78  Pulse: (!) 58 69  Resp:    Temp: 36.8 C 36.8 C  SpO2:  98%    Last Pain:  Vitals:   10/05/17 0603  TempSrc: Oral                 Leda Bellefeuille,JAMES TERRILL

## 2017-10-29 ENCOUNTER — Encounter: Payer: Self-pay | Admitting: Physician Assistant

## 2017-11-05 ENCOUNTER — Ambulatory Visit (HOSPITAL_COMMUNITY)
Admission: RE | Admit: 2017-11-05 | Discharge: 2017-11-05 | Disposition: A | Discharge status: Home / Self Care | Payer: BLUE CROSS/BLUE SHIELD | Source: Ambulatory Visit | Attending: Nurse Practitioner | Admitting: Nurse Practitioner

## 2017-11-05 ENCOUNTER — Encounter (HOSPITAL_COMMUNITY): Payer: Self-pay | Admitting: Nurse Practitioner

## 2017-11-05 VITALS — BP 124/82 | HR 51 | Ht 66.0 in | Wt 208.0 lb

## 2017-11-05 DIAGNOSIS — I4819 Other persistent atrial fibrillation: Secondary | ICD-10-CM

## 2017-11-05 DIAGNOSIS — Z8249 Family history of ischemic heart disease and other diseases of the circulatory system: Secondary | ICD-10-CM | POA: Diagnosis not present

## 2017-11-05 DIAGNOSIS — Z7901 Long term (current) use of anticoagulants: Secondary | ICD-10-CM | POA: Insufficient documentation

## 2017-11-05 DIAGNOSIS — Z9889 Other specified postprocedural states: Secondary | ICD-10-CM | POA: Insufficient documentation

## 2017-11-05 DIAGNOSIS — Z791 Long term (current) use of non-steroidal anti-inflammatories (NSAID): Secondary | ICD-10-CM | POA: Insufficient documentation

## 2017-11-05 DIAGNOSIS — I429 Cardiomyopathy, unspecified: Secondary | ICD-10-CM | POA: Diagnosis not present

## 2017-11-05 DIAGNOSIS — G4730 Sleep apnea, unspecified: Secondary | ICD-10-CM | POA: Diagnosis not present

## 2017-11-05 DIAGNOSIS — I481 Persistent atrial fibrillation: Secondary | ICD-10-CM | POA: Insufficient documentation

## 2017-11-05 DIAGNOSIS — K219 Gastro-esophageal reflux disease without esophagitis: Secondary | ICD-10-CM | POA: Insufficient documentation

## 2017-11-05 DIAGNOSIS — D61818 Other pancytopenia: Secondary | ICD-10-CM | POA: Insufficient documentation

## 2017-11-05 DIAGNOSIS — Z833 Family history of diabetes mellitus: Secondary | ICD-10-CM | POA: Diagnosis not present

## 2017-11-05 DIAGNOSIS — G610 Guillain-Barre syndrome: Secondary | ICD-10-CM | POA: Insufficient documentation

## 2017-11-05 DIAGNOSIS — Z823 Family history of stroke: Secondary | ICD-10-CM | POA: Insufficient documentation

## 2017-11-05 DIAGNOSIS — Z79899 Other long term (current) drug therapy: Secondary | ICD-10-CM | POA: Insufficient documentation

## 2017-11-05 DIAGNOSIS — Z841 Family history of disorders of kidney and ureter: Secondary | ICD-10-CM | POA: Diagnosis not present

## 2017-11-05 DIAGNOSIS — Z8673 Personal history of transient ischemic attack (TIA), and cerebral infarction without residual deficits: Secondary | ICD-10-CM | POA: Diagnosis not present

## 2017-11-05 DIAGNOSIS — I10 Essential (primary) hypertension: Secondary | ICD-10-CM | POA: Insufficient documentation

## 2017-11-05 DIAGNOSIS — Z9884 Bariatric surgery status: Secondary | ICD-10-CM | POA: Insufficient documentation

## 2017-11-05 NOTE — Progress Notes (Signed)
Primary Care Physician: Porfirio Oar, PA-C Referring Physician:Dr. Marney Doctor is a 52 y.o. male with a h/o CVA, gastric bypass,  afib ablation in 2013. He had been doing well for some time when a person in his office was discussing how heart rate can be tracked on apple watch. He checked his heart rate trends  and found to be in afib with RVR since 1/11. GHe was asymptomatic.  He was  seen in afib clinic as an urgent work in for management of afib. No known triggers other than stress related to work often keeps him up at night. He had RVR in the 140;'s in the office, again does not feel the irregular heart beat. He has continued to work hard to keep his weight down since the gastric bypass. Was on BB at one time but eventually stopped 2/2 brady after ablation/SR.   He went on to cardioversion but ERAF. He then elected to have a second ablation and is here for one month f/u. He is doing well. He has not experienced any afib. No swallowing or groin issues. Is back to his usual activities.    Today, he denies symptoms of palpitations, chest pain, shortness of breath, orthopnea, PND, lower extremity edema, dizziness, presyncope, syncope, or neurologic sequela. The patient is tolerating medications without difficulties and is otherwise without complaint today.   Past Medical History:  Diagnosis Date  . Anemia, iron deficiency 01/30/2017  . Cervicalgia   . CVA (cerebral infarction) 05/01/12   on pradaxa at time of stroke, switched to xarelto  . External thrombosed hemorrhoids   . GERD (gastroesophageal reflux disease)   . Guillain Barr syndrome [facility])    following seasonal influenza vaccination  . Guillain-Barre syndrome (HCC)    from stroke in 2013  . Hematuria    a. 01/2012 - to f/u with urology as outpt.  Marland Kitchen Hernia of unspecified site of abdominal cavity without mention of obstruction or gangrene    of abdominal  . Hx of gastric bypass 01/30/2017   2004 Dr Clent Ridges [facility] weight 450 lbs  . Hypertension   . Iron malabsorption 01/30/2017   Hx gastric bypass 2004  . Morbid obesity s/p gastric bypass   . Nonischemic cardiomyopathy (HCC)    a. 01/2012 Echo: EF 35-40%, Diff HK, mild MR;  b. 01/2012 R&L Heart Cath: mildly elevated R heart pressures (pcwp 17) , nl cors.  . Osteoarthrosis, unspecified whether generalized or localized, ankle and foot    of the knees,ankle and foot  . Pancytopenia   . Persistent atrial fibrillation (HCC)    a. Pradaxa started 01/2012;  b .s/p TEE/DCCV 02/16/2012; c. recurrent afib->Tikosyn Initiation 03/2012  . Sleep apnea    CPAP compliant  . Status post bypass gastrojejunostomy 01/30/2017  . Unspecified intestinal malabsorption   . Urticaria, unspecified   . Vitamin B 12 deficiency    Past Surgical History:  Procedure Laterality Date  . ATRIAL FIBRILLATION ABLATION N/A 06/25/2012   PVI by Dr [name]  . ATRIAL FIBRILLATION ABLATION N/A 10/04/2017   Procedure: ATRIAL FIBRILLATION ABLATION;  Surgeon: Hillis Range, MD;  [facility];  Service: Cardiovascular;  Laterality: N/A;  . CARDIOVERSION  02/16/2012   Procedure: CARDIOVERSION;  Surgeon: [name], MD;  Location: [facility] ENDOSCOPY;  Service: Cardiovascular;  Laterality: N/A;  . CARDIOVERSION  04/22/2012   Procedure: CARDIOVERSION;  Surgeon: [name], MD;  Location: [facility] ENDOSCOPY;  Service: Cardiovascular;  Laterality:  N/A;  . CARDIOVERSION N/A 08/22/2017   Procedure: CARDIOVERSION;  Surgeon: Chilton Si, MD;  Location: [facility] ENDOSCOPY;  Service: Cardiovascular;  Laterality: N/A;  . COLONOSCOPY N/A 07/07/2016   Procedure: COLONOSCOPY;  Surgeon: Jeani Hawking, MD;  Location: [facility] ENDOSCOPY;  Service: Endoscopy;  Laterality: N/A;  . ESOPHAGOGASTRODUODENOSCOPY N/A 07/07/2016   Procedure: ESOPHAGOGASTRODUODENOSCOPY (EGD);  Surgeon: Jeani Hawking, MD;  Location: [facility] ENDOSCOPY;  Service: Endoscopy;  Laterality: N/A;  . GASTRIC BYPASS  2004  . HERNIA REPAIR    .  LEFT AND RIGHT HEART CATHETERIZATION WITH CORONARY ANGIOGRAM N/A 02/13/2012   Procedure: LEFT AND RIGHT HEART CATHETERIZATION WITH CORONARY ANGIOGRAM;  Surgeon: Tonny Bollman, MD;  Location: [facility] CATH LAB;  Service: Cardiovascular;  Laterality: N/A;  . TEE WITHOUT CARDIOVERSION  02/16/2012   Procedure: TRANSESOPHAGEAL ECHOCARDIOGRAM (TEE);  Surgeon: [name], MD;  Location: [facility] ENDOSCOPY;  Service: Cardiovascular;  Laterality: N/A;  10a CV  . TEE WITHOUT CARDIOVERSION  06/24/2012   Procedure: TRANSESOPHAGEAL ECHOCARDIOGRAM (TEE);  Surgeon: [name], MD;  Location: [facility] ENDOSCOPY;  Service: Cardiovascular;  Laterality: N/A;  . TEE WITHOUT CARDIOVERSION N/A 08/22/2017   Procedure: TRANSESOPHAGEAL ECHOCARDIOGRAM (TEE);  Surgeon: Chilton Si, MD;  Location: [facility] ENDOSCOPY;  Service: Cardiovascular;  Laterality: N/A;  . UMBILICAL HERNIA REPAIR     x 2    Current Outpatient Medications  Medication Sig Dispense Refill  . amLODipine (NORVASC) 5 MG tablet TAKE ONE TABLET BY MOUTH ONCE DAILY (Patient taking differently: TAKE ONE TABLET BY MOUTH ONCE DAILY IN THE EVENING) 30 tablet 1  . ferrous sulfate 325 (65 FE) MG tablet Take 325 mg by mouth daily with breakfast.    . folic acid (FOLVITE) 1 MG tablet Take 1 tablet (1 mg total) by mouth daily. (Patient taking differently: Take 1 mg by mouth every evening. ) 100 tablet 3  . furosemide (LASIX) 40 MG tablet Take 20 mg by mouth daily.    Marland Kitchen ibuprofen (ADVIL,MOTRIN) 200 MG tablet Take 400 mg by mouth daily as needed for moderate pain.     Marland Kitchen losartan (COZAAR) 100 MG tablet Take 0.5 tablets (50 mg total) by mouth 2 (two) times daily. 90 tablet 3  . Methylcellulose, Laxative, (CITRUCEL) 500 MG TABS Take 500 mg by mouth daily.    . metoprolol tartrate (LOPRESSOR) 25 MG tablet Take 4 tablets (100 mg total) by mouth 2 (two) times daily. 180 tablet 3  . Multiple Vitamin (MULTIVITAMIN) tablet Take 1-2 tablets by mouth See admin instructions. Take 2 tablets  in the morning and 1 tablet in the evening    . omeprazole (PRILOSEC) 40 MG capsule Take 40 mg by mouth daily.   1  . spironolactone (ALDACTONE) 50 MG tablet TAKE ONE TABLET BY MOUTH ONCE DAILY 30 tablet 1  . trolamine salicylate (ASPERCREME) 10 % cream Apply 1 application topically as needed for muscle pain.    . vitamin E (VITAMIN E) 400 UNIT capsule Take 400 Units by mouth daily.    [name] Hurl 20 MG TABS tablet TAKE ONE TABLET BY MOUTH DAILY WITH  SUPPER (Patient taking differently: TAKE 20 MG BY MOUTH DAILY WITH  SUPPER) 30 tablet 1   No current facility-administered medications for this encounter.    Facility-Administered Medications Ordered in Other Encounters  Medication Dose Route Frequency Provider Last Rate Last Dose  . gadopentetate dimeglumine (MAGNEVIST) injection 20 mL  20 mL Intravenous Once PRN Anson Fret, MD        No Known Allergies  Social History   Socioeconomic History  . Marital status: Married    Spouse name: Not on file  . Number of children: 1  . Years of education: Not on file  . Highest education level: Not on file  Occupational History  . Occupation: Veterinary surgeon  Social Needs  . Financial resource strain: Not on file  . Food insecurity:    Worry: Not on file    Inability: Not on file  . Transportation needs:    Medical: Not on file    Non-medical: Not on file  Tobacco Use  . Smoking status: Never Smoker  . Smokeless tobacco: Former Neurosurgeon    Types: Chew  Substance and Sexual Activity  . Alcohol use: Yes    Comment: occasional  . Drug use: No  . Sexual activity: Yes  Lifestyle  . Physical activity:    Days per week: Not on file    Minutes per session: Not on file  . Stress: Not on file  Relationships  . Social connections:    Talks on phone: Not on file    Gets together: Not on file    Attends religious service: Not on file    Active member of club or organization: Not on file    Attends meetings of clubs or organizations: Not on file     Relationship status: Not on file  . Intimate partner violence:    Fear of current or ex partner: Not on file    Emotionally abused: Not on file    Physically abused: Not on file    Forced sexual activity: Not on file  Other Topics Concern  . Not on file  Social History Narrative   Lives with wife.  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 Hypertension Mother   . Hypertension Father   . Heart attack Father 18       Died  . Diabetes Father   . Kidney disease Father        with transplant  . Stroke Paternal Grandmother     ROS- All systems are reviewed and negative except as per the HPI above  Physical Exam: Vitals:   11/05/17 0833  BP: 124/82  Pulse: (!) 51  Weight: 208 lb (94.3 kg)  Height: 5\' 6"  (1.676 m)   Wt Readings from Last 3 Encounters:  11/05/17 208 lb (94.3 kg)  10/05/17 212 lb 1.3 oz (96.2 kg)  09/10/17 212 lb (96.2 kg)    Labs: Lab Results  Component Value Date   NA 142 09/27/2017   K 4.4 09/27/2017   CL 104 09/27/2017   CO2 26 09/27/2017   GLUCOSE 99 09/27/2017   BUN 11 09/27/2017   CREATININE 0.76 09/27/2017   CALCIUM 8.7 09/27/2017   MG 1.8 08/14/2017   Lab Results  Component Value Date   INR 1.29 05/01/2012   Lab Results  Component Value Date   CHOL 165 06/15/2015   HDL 85 06/15/2015   LDLCALC 71 06/15/2015   TRIG 46 06/15/2015     GEN- The patient is well appearing, alert and oriented x 3 today.   Head- normocephalic, atraumatic Eyes-  Sclera clear, conjunctiva pink Ears- hearing intact Oropharynx- clear Neck- supple, no JVP Lymph- no cervical lymphadenopathy Lungs- Clear to ausculation bilaterally, normal work of breathing Heart- regular rate and rhythm, no murmurs, rubs or gallops, PMI not laterally displaced GI- soft, NT, ND, +  BS Extremities- no clubbing, cyanosis, or edema MS- no significant deformity or atrophy Skin- no rash or lesion Psych-  euthymic mood, full affect Neuro- strength and sensation are intact  EKG-Sinus brady at 51 bpm, pr int 134 ms, qrs int 104 ms, qtc 423 ms Epic records reviewed    Assessment and Plan: 1. Persistent afib S/p ablation 3/14 Doing well, no afib noted Continue xarelto 20 mg daily for chadsvasc score of at least 4, aware not to interrupt anticoagulation for the full 3 month healing period Continue metoprolol without change   2. HTN Stable  F/u with Dr. [name] 6/24  [facility]vina Sidle. [facility] [address] [place], [place] [address] [phone]

## 2017-11-08 ENCOUNTER — Other Ambulatory Visit (HOSPITAL_COMMUNITY): Payer: Self-pay | Admitting: *Deleted

## 2017-11-08 MED ORDER — METOPROLOL TARTRATE 100 MG PO TABS: 100.0000 mg | ORAL_TABLET | Freq: Two times a day (BID) | ORAL | 2 refills | Status: DC

## 2017-12-04 ENCOUNTER — Other Ambulatory Visit: Payer: Self-pay | Admitting: Family Medicine

## 2017-12-04 DIAGNOSIS — E559 Vitamin D deficiency, unspecified: Secondary | ICD-10-CM | POA: Diagnosis not present

## 2017-12-04 DIAGNOSIS — Z125 Encounter for screening for malignant neoplasm of prostate: Secondary | ICD-10-CM | POA: Diagnosis not present

## 2017-12-04 DIAGNOSIS — R634 Abnormal weight loss: Secondary | ICD-10-CM

## 2017-12-04 DIAGNOSIS — R5382 Chronic fatigue, unspecified: Secondary | ICD-10-CM | POA: Diagnosis not present

## 2017-12-04 DIAGNOSIS — R590 Localized enlarged lymph nodes: Secondary | ICD-10-CM

## 2017-12-04 DIAGNOSIS — I10 Essential (primary) hypertension: Secondary | ICD-10-CM | POA: Diagnosis not present

## 2017-12-04 DIAGNOSIS — Z0000 Encounter for general adult medical examination without abnormal findings: Secondary | ICD-10-CM | POA: Diagnosis not present

## 2017-12-06 ENCOUNTER — Ambulatory Visit
Admission: RE | Admit: 2017-12-06 | Discharge: 2017-12-06 | Disposition: A | Discharge status: Home / Self Care | Payer: BLUE CROSS/BLUE SHIELD | Source: Ambulatory Visit | Attending: Family Medicine | Admitting: Family Medicine

## 2017-12-06 DIAGNOSIS — R590 Localized enlarged lymph nodes: Secondary | ICD-10-CM

## 2017-12-06 DIAGNOSIS — R05 Cough: Secondary | ICD-10-CM | POA: Diagnosis not present

## 2017-12-06 DIAGNOSIS — R634 Abnormal weight loss: Secondary | ICD-10-CM

## 2018-01-01 DIAGNOSIS — L814 Other melanin hyperpigmentation: Secondary | ICD-10-CM | POA: Diagnosis not present

## 2018-01-09 DIAGNOSIS — L814 Other melanin hyperpigmentation: Secondary | ICD-10-CM | POA: Diagnosis not present

## 2018-01-14 ENCOUNTER — Ambulatory Visit: Payer: BLUE CROSS/BLUE SHIELD | Admitting: Internal Medicine

## 2018-01-14 ENCOUNTER — Encounter: Payer: Self-pay | Admitting: Internal Medicine

## 2018-01-14 VITALS — BP 122/60 | HR 60 | Ht 66.0 in | Wt 201.0 lb

## 2018-01-14 DIAGNOSIS — L814 Other melanin hyperpigmentation: Secondary | ICD-10-CM | POA: Diagnosis not present

## 2018-01-14 DIAGNOSIS — I4819 Other persistent atrial fibrillation: Secondary | ICD-10-CM

## 2018-01-14 DIAGNOSIS — I481 Persistent atrial fibrillation: Secondary | ICD-10-CM | POA: Diagnosis not present

## 2018-01-14 DIAGNOSIS — I10 Essential (primary) hypertension: Secondary | ICD-10-CM | POA: Diagnosis not present

## 2018-01-14 MED ORDER — METOPROLOL TARTRATE 50 MG PO TABS: 50.0000 mg | ORAL_TABLET | Freq: Two times a day (BID) | ORAL | 0 refills | Status: DC

## 2018-01-14 MED ORDER — METOPROLOL TARTRATE 25 MG PO TABS: 25.0000 mg | ORAL_TABLET | Freq: Two times a day (BID) | ORAL | 1 refills | Status: DC

## 2018-01-14 NOTE — Progress Notes (Signed)
PCP: Shirlean Mylar, MD Primary Cardiologist: [name] is a 52 y.o. male who presents today for routine electrophysiology followup.  Since his recent afib ablation, the patient reports doing very well.  he denies procedure related complications and is pleased with the results of the procedure.  Today, he denies symptoms of palpitations, chest pain, shortness of breath,  lower extremity edema, dizziness, presyncope, or syncope.  The patient is otherwise without complaint today.   Past Medical History:  Diagnosis Date  . Anemia, iron deficiency 01/30/2017  . Cervicalgia   . CVA (cerebral infarction) 05/01/12   on pradaxa at time of stroke, switched to xarelto  . External thrombosed hemorrhoids   . GERD (gastroesophageal reflux disease)   . Guillain Barr syndrome [facility])    following seasonal influenza vaccination  . Guillain-Barre syndrome (HCC)    from stroke in 2013  . Hematuria    a. 01/2012 - to f/u with urology as outpt.  Marland Kitchen Hernia of unspecified site of abdominal cavity without mention of obstruction or gangrene    of abdominal  . Hx of gastric bypass 01/30/2017   2004 Dr Clent Ridges [facility] weight 450 lbs  . Hypertension   . Iron malabsorption 01/30/2017   Hx gastric bypass 2004  . Morbid obesity s/p gastric bypass   . Nonischemic cardiomyopathy (HCC)    a. 01/2012 Echo: EF 35-40%, Diff HK, mild MR;  b. 01/2012 R&L Heart Cath: mildly elevated R heart pressures (pcwp 17) , nl cors.  . Osteoarthrosis, unspecified whether generalized or localized, ankle and foot    of the knees,ankle and foot  . Pancytopenia   . Persistent atrial fibrillation (HCC)    a. Pradaxa started 01/2012;  b .s/p TEE/DCCV 02/16/2012; c. recurrent afib->Tikosyn Initiation 03/2012  . Sleep apnea    CPAP compliant  . Status post bypass gastrojejunostomy 01/30/2017  . Unspecified intestinal malabsorption   . Urticaria, unspecified   . Vitamin B 12 deficiency    Past Surgical History:    Procedure Laterality Date  . ATRIAL FIBRILLATION ABLATION N/A 06/25/2012   PVI by Dr [name]  . ATRIAL FIBRILLATION ABLATION N/A 10/04/2017   Procedure: ATRIAL FIBRILLATION ABLATION;  Surgeon: Hillis Range, MD;  [facility] INVASIVE CV LAB;  Service: Cardiovascular;  Laterality: N/A;  . CARDIOVERSION  02/16/2012   Procedure: CARDIOVERSION;  Surgeon: [name], MD;  Location: [facility] ENDOSCOPY;  Service: Cardiovascular;  Laterality: N/A;  . CARDIOVERSION  04/22/2012   Procedure: CARDIOVERSION;  Surgeon: [name], MD;  Location: [facility] ENDOSCOPY;  Service: Cardiovascular;  Laterality: N/A;  . CARDIOVERSION N/A 08/22/2017   Procedure: CARDIOVERSION;  Surgeon: Chilton Si, MD;  Location: [facility] ENDOSCOPY;  Service: Cardiovascular;  Laterality: N/A;  . COLONOSCOPY N/A 07/07/2016   Procedure: COLONOSCOPY;  Surgeon: Jeani Hawking, MD;  Location: [facility] ENDOSCOPY;  Service: Endoscopy;  Laterality: N/A;  . ESOPHAGOGASTRODUODENOSCOPY N/A 07/07/2016   Procedure: ESOPHAGOGASTRODUODENOSCOPY (EGD);  Surgeon: Jeani Hawking, MD;  Location: [facility] ENDOSCOPY;  Service: Endoscopy;  Laterality: N/A;  . GASTRIC BYPASS  2004  . HERNIA REPAIR    . LEFT AND RIGHT HEART CATHETERIZATION WITH CORONARY ANGIOGRAM N/A 02/13/2012   Procedure: LEFT AND RIGHT HEART CATHETERIZATION WITH CORONARY ANGIOGRAM;  Surgeon: Tonny Bollman, MD;  Location: [facility] CATH LAB;  Service: Cardiovascular;  Laterality: N/A;  . TEE WITHOUT CARDIOVERSION  02/16/2012   Procedure: TRANSESOPHAGEAL ECHOCARDIOGRAM (TEE);  Surgeon: [name], MD;  Location: [facility] ENDOSCOPY;  Service: Cardiovascular;  Laterality: N/A;  10a CV  . TEE WITHOUT CARDIOVERSION  06/24/2012   Procedure: TRANSESOPHAGEAL ECHOCARDIOGRAM (TEE);  Surgeon: [name], MD;  Location: [facility] ENDOSCOPY;  Service: Cardiovascular;  Laterality: N/A;  . TEE WITHOUT CARDIOVERSION N/A 08/22/2017   Procedure: TRANSESOPHAGEAL ECHOCARDIOGRAM (TEE);  Surgeon: Chilton Si, MD;  Location: [facility]  ENDOSCOPY;  Service: Cardiovascular;  Laterality: N/A;  . UMBILICAL HERNIA REPAIR     x 2    ROS- all systems are personally reviewed and negatives except as per HPI above  Current Outpatient Medications  Medication Sig Dispense Refill  . amLODipine (NORVASC) 5 MG tablet TAKE ONE TABLET BY MOUTH ONCE DAILY (Patient taking differently: TAKE ONE TABLET BY MOUTH ONCE DAILY IN THE EVENING) 30 tablet 1  . ferrous sulfate 325 (65 FE) MG tablet Take 325 mg by mouth daily with breakfast.    . folic acid (FOLVITE) 1 MG tablet Take 1 tablet (1 mg total) by mouth daily. (Patient taking differently: Take 1 mg by mouth every evening. ) 100 tablet 3  . furosemide (LASIX) 40 MG tablet Take 20 mg by mouth daily.    Marland Kitchen ibuprofen (ADVIL,MOTRIN) 200 MG tablet Take 400 mg by mouth daily as needed for moderate pain.     Marland Kitchen losartan (COZAAR) 100 MG tablet Take 0.5 tablets (50 mg total) by mouth 2 (two) times daily. 90 tablet 3  . Methylcellulose, Laxative, (CITRUCEL) 500 MG TABS Take 500 mg by mouth daily.    . metoprolol tartrate (LOPRESSOR) 100 MG tablet Take 1 tablet (100 mg total) by mouth 2 (two) times daily. 180 tablet 2  . Multiple Vitamin (MULTIVITAMIN) tablet Take 1-2 tablets by mouth See admin instructions. Take 2 tablets in the morning and 1 tablet in the evening    . omeprazole (PRILOSEC) 40 MG capsule Take 40 mg by mouth daily.   1  . spironolactone (ALDACTONE) 50 MG tablet TAKE ONE TABLET BY MOUTH ONCE DAILY 30 tablet 1  . trolamine salicylate (ASPERCREME) 10 % cream Apply 1 application topically as needed for muscle pain.    . vitamin E (VITAMIN E) 400 UNIT capsule Take 400 Units by mouth daily.    [name] Hurl 20 MG TABS tablet TAKE ONE TABLET BY MOUTH DAILY WITH  SUPPER (Patient taking differently: TAKE 20 MG BY MOUTH DAILY WITH  SUPPER) 30 tablet 1   No current facility-administered medications for this visit.    Facility-Administered Medications Ordered in Other Visits  Medication Dose Route  Frequency Provider Last Rate Last Dose  . gadopentetate dimeglumine (MAGNEVIST) injection 20 mL  20 mL Intravenous Once PRN Anson Fret, MD        Physical Exam: Vitals:   01/14/18 0958  BP: 122/60  Pulse: 60  Weight: 201 lb (91.2 kg)  Height: 5\' 6"  (1.676 m)    GEN- The patient is well appearing, alert and oriented x 3 today.   Head- normocephalic, atraumatic Eyes-  Sclera clear, conjunctiva pink Ears- hearing intact Oropharynx- clear Lungs- Clear to ausculation bilaterally, normal work of breathing Heart- Regular rate and rhythm, no murmurs, rubs or gallops, PMI not laterally displaced GI- soft, NT, ND, + BS Extremities- no clubbing, cyanosis, or edema  EKG tracing ordered today is personally reviewed and shows sinus rhythm 60 bpm, PR 144 msec, QRS 104, Qtc 458 msec, LVH, nonspecific ST/T changes  Assessment and Plan:  1. Persistent atrial fibrillation Doing well s/p ablation chads2vasc score is at least 3 (NICM has resolved previously).  Given prior stroke, would  continue life long anticoagulation. Reduce metoprolol to 50mg  BID x 6 weeks then 25mg  BID.  Plan to stop metoprolol upon return.  2. HTN Stable No change required today  3. Overweight Body mass index is 32.44 kg/m. Wt Readings from Last 3 Encounters:  01/14/18 201 lb (91.2 kg)  11/05/17 208 lb (94.3 kg)  10/05/17 212 lb 1.3 oz (96.2 kg)   We discussed lifestyle modification today.  He is worried that his weight loss has been unintentional.  He will follow with PCP.  4. Nonischemic CM Tachycardia mediated Resolved with sinus rhythm  Return to see me in 3 months  Hillis Range MD, [facility] 01/14/2018 10:08 AM

## 2018-01-14 NOTE — Patient Instructions (Addendum)
Medication Instructions:  Your physician has recommended you make the following change in your medication:  1. DECREASE Metoprolol to 50 mg twice daily for 6 weeks, then reduce to 25 mg twice daily  Labwork: None ordered  Testing/Procedures: None ordered  Follow-Up: Your physician recommends that you schedule a follow-up appointment in: 3 months with Dr. [name].   * If you need a refill on your cardiac medications before your next appointment, please call your pharmacy.   *Please note that any paperwork needing to be filled out by the provider will need to be addressed at the front desk prior to seeing the provider. Please note that any FMLA, disability or other documents regarding health condition is subject to a $25.00 charge that must be received prior to completion of paperwork in the form of a money order or check.  Thank you for choosing [facility]!!    Any Other Special Instructions Will Be Listed Below (If Applicable).

## 2018-02-07 ENCOUNTER — Other Ambulatory Visit: Payer: Self-pay | Admitting: Gastroenterology

## 2018-02-07 DIAGNOSIS — R1084 Generalized abdominal pain: Secondary | ICD-10-CM

## 2018-02-07 DIAGNOSIS — R634 Abnormal weight loss: Secondary | ICD-10-CM | POA: Diagnosis not present

## 2018-02-07 DIAGNOSIS — R197 Diarrhea, unspecified: Secondary | ICD-10-CM | POA: Diagnosis not present

## 2018-02-07 DIAGNOSIS — K219 Gastro-esophageal reflux disease without esophagitis: Secondary | ICD-10-CM | POA: Diagnosis not present

## 2018-02-07 DIAGNOSIS — R194 Change in bowel habit: Secondary | ICD-10-CM | POA: Diagnosis not present

## 2018-02-08 ENCOUNTER — Ambulatory Visit (HOSPITAL_COMMUNITY): Payer: BLUE CROSS/BLUE SHIELD

## 2018-02-08 ENCOUNTER — Ambulatory Visit
Admission: RE | Admit: 2018-02-08 | Discharge: 2018-02-08 | Disposition: A | Discharge status: Home / Self Care | Payer: BLUE CROSS/BLUE SHIELD | Source: Ambulatory Visit | Attending: Gastroenterology | Admitting: Gastroenterology

## 2018-02-08 DIAGNOSIS — R197 Diarrhea, unspecified: Secondary | ICD-10-CM | POA: Diagnosis not present

## 2018-02-08 DIAGNOSIS — R1084 Generalized abdominal pain: Secondary | ICD-10-CM

## 2018-02-08 MED ORDER — IOPAMIDOL (ISOVUE-300) INJECTION 61%
100.0000 mL | Freq: Once | INTRAVENOUS | Status: AC | PRN
  Administered 2018-02-08: 100 mL via INTRAVENOUS

## 2018-02-09 ENCOUNTER — Ambulatory Visit (INDEPENDENT_AMBULATORY_CARE_PROVIDER_SITE_OTHER): Payer: BLUE CROSS/BLUE SHIELD

## 2018-02-09 ENCOUNTER — Ambulatory Visit (HOSPITAL_COMMUNITY)
Admission: EM | Admit: 2018-02-09 | Discharge: 2018-02-09 | Disposition: A | Discharge status: Home / Self Care | Payer: BLUE CROSS/BLUE SHIELD | Attending: Family Medicine | Admitting: Family Medicine

## 2018-02-09 ENCOUNTER — Encounter (HOSPITAL_COMMUNITY): Payer: Self-pay | Admitting: *Deleted

## 2018-02-09 ENCOUNTER — Other Ambulatory Visit: Payer: Self-pay

## 2018-02-09 DIAGNOSIS — R05 Cough: Secondary | ICD-10-CM

## 2018-02-09 DIAGNOSIS — J181 Lobar pneumonia, unspecified organism: Secondary | ICD-10-CM | POA: Diagnosis not present

## 2018-02-09 DIAGNOSIS — J189 Pneumonia, unspecified organism: Secondary | ICD-10-CM

## 2018-02-09 HISTORY — DX: Cerebral infarction, unspecified: I63.9

## 2018-02-09 MED ORDER — AZITHROMYCIN 250 MG PO TABS: 250.0000 mg | ORAL_TABLET | Freq: Every day | ORAL | 0 refills | Status: DC

## 2018-02-09 NOTE — Discharge Instructions (Signed)
Chest xray consistent with CT finding, with left lower lobe pneumonia. Start azithromycin as directed. Tylenol/motrin for pain/fever/chills. Keep hydrated, your urine should be clear to pale yellow in color. Monitor for worsening symptoms, chest pain, shortness of breath, wheezing, palpitations, follow up for reevaluation. Otherwise, follow up with PCP for further management and monitoring needed.  For sore throat/cough try using a honey-based tea. Use 3 teaspoons of honey with juice squeezed from half lemon. Place shaved pieces of ginger into 1/2-1 cup of water and warm over stove top. Then mix the ingredients and repeat every 4 hours as needed.

## 2018-02-09 NOTE — ED Notes (Signed)
Bed: UC01 Expected date:  Expected time:  Means of arrival:  Comments: 

## 2018-02-09 NOTE — ED Triage Notes (Signed)
Reports having had CT scan abdomen yesterday for stomach issues - was notified that lower lobe pneumonia was identified; was told to get a CXR to have radiograph of entire lung fields. Has been having cough, worse over past 2-3 days.  Denies fevers, but c/o chills yesterday.

## 2018-02-09 NOTE — ED Provider Notes (Signed)
[facility]    CSN: [id number] Arrival date & time: 02/09/18  1033     History   Chief Complaint Chief Complaint  Patient presents with  . Pneumonia    HPI Tyrone [name] is a 52 y.o. male.   52 year old male with history of CVA, GERD, HTN, A. Fib on Xarelto, nonischemic cardiomyopathy comes in for chest x-ray.  He had a CT scan of the abdomen yesterday for stomach issues, was told a possible lower lobe pneumonia was identified during CT scan, and was told to come in for a chest x-ray.  Has been having productive cough, worse over the past 2 to 3 days.  Denies fever, but with chills yesterday.  Denies rhinorrhea, nasal congestion.  Denies sore throat.  Has still been eating and drinking, though with decreased appetite, that has been evaluated by CT scan of the abdomen yesterday.  Denies chest pain, shortness of breath.  Has felt some wheezing.  Denies palpitations.  Denies leg swelling.  Never smoker.     Past Medical History:  Diagnosis Date  . Anemia, iron deficiency 01/30/2017  . Cervicalgia   . CVA (cerebral infarction) 05/01/12   on pradaxa at time of stroke, switched to xarelto  . External thrombosed hemorrhoids   . GERD (gastroesophageal reflux disease)   . Guillain Barr syndrome [facility])    following seasonal influenza vaccination  . Guillain-Barre syndrome (HCC)    from stroke in 2013  . Hematuria    a. 01/2012 - to f/u with urology as outpt.  Marland Kitchen Hernia of unspecified site of abdominal cavity without mention of obstruction or gangrene    of abdominal  . Hx of gastric bypass 01/30/2017   2004 Dr Clent Ridges [facility] weight 450 lbs  . Hypertension   . Iron malabsorption 01/30/2017   Hx gastric bypass 2004  . Morbid obesity s/p gastric bypass   . Nonischemic cardiomyopathy (HCC)    a. 01/2012 Echo: EF 35-40%, Diff HK, mild MR;  b. 01/2012 R&L Heart Cath: mildly elevated R heart pressures (pcwp 17) , nl cors.  . Osteoarthrosis, unspecified whether generalized  or localized, ankle and foot    of the knees,ankle and foot  . Pancytopenia   . Persistent atrial fibrillation (HCC)    a. Pradaxa started 01/2012;  b .s/p TEE/DCCV 02/16/2012; c. recurrent afib->Tikosyn Initiation 03/2012  . Sleep apnea    CPAP compliant  . Status post bypass gastrojejunostomy 01/30/2017  . Stroke (HCC)   . Unspecified intestinal malabsorption   . Urticaria, unspecified   . Vitamin B 12 deficiency     Patient Active Problem List   Diagnosis Date Noted  . Persistent atrial fibrillation (HCC) 10/04/2017  . Iron malabsorption 01/30/2017  . Hx of gastric bypass 01/30/2017  . Status post bypass gastrojejunostomy 01/30/2017  . Anemia, iron deficiency 01/30/2017  . Left-sided epistaxis 07/04/2016  . Anemia 06/05/2016  . OSA on CPAP 06/05/2016  . Tremor 07/05/2015  . Cerebral embolism with cerebral infarction (HCC) 05/01/2012  . Reflux esophagitis   . Chronic combined systolic and diastolic heart failure (HCC) 04/07/2012  . Hypokalemia 04/06/2012  . Sinoatrial node dysfunction/bradycardia   . Nonischemic cardiomyopathy (HCC)   . Atrial fibrillation (HCC)   . Hypertension 11/16/2011  . Obesity 11/16/2011    Past Surgical History:  Procedure Laterality Date  . ATRIAL FIBRILLATION ABLATION N/A 06/25/2012   PVI by Dr [name]  . ATRIAL FIBRILLATION ABLATION N/A 10/04/2017   Procedure: ATRIAL FIBRILLATION ABLATION;  Surgeon: Hillis Range, MD;  Location: [facility] INVASIVE CV LAB;  Service: Cardiovascular;  Laterality: N/A;  . CARDIOVERSION  02/16/2012   Procedure: CARDIOVERSION;  Surgeon: [name], MD;  Location: [facility] ENDOSCOPY;  Service: Cardiovascular;  Laterality: N/A;  . CARDIOVERSION  04/22/2012   Procedure: CARDIOVERSION;  Surgeon: [name], MD;  Location: [facility] ENDOSCOPY;  Service: Cardiovascular;  Laterality: N/A;  . CARDIOVERSION N/A 08/22/2017   Procedure: CARDIOVERSION;  Surgeon: Chilton Si, MD;  Location: [facility] ENDOSCOPY;  Service: Cardiovascular;   Laterality: N/A;  . COLONOSCOPY N/A 07/07/2016   Procedure: COLONOSCOPY;  Surgeon: Jeani Hawking, MD;  Location: [facility] ENDOSCOPY;  Service: Endoscopy;  Laterality: N/A;  . ESOPHAGOGASTRODUODENOSCOPY N/A 07/07/2016   Procedure: ESOPHAGOGASTRODUODENOSCOPY (EGD);  Surgeon: Jeani Hawking, MD;  Location: [facility] ENDOSCOPY;  Service: Endoscopy;  Laterality: N/A;  . GASTRIC BYPASS  2004  . HERNIA REPAIR    . LEFT AND RIGHT HEART CATHETERIZATION WITH CORONARY ANGIOGRAM N/A 02/13/2012   Procedure: LEFT AND RIGHT HEART CATHETERIZATION WITH CORONARY ANGIOGRAM;  Surgeon: Tonny Bollman, MD;  Location: [facility] CATH LAB;  Service: Cardiovascular;  Laterality: N/A;  . TEE WITHOUT CARDIOVERSION  02/16/2012   Procedure: TRANSESOPHAGEAL ECHOCARDIOGRAM (TEE);  Surgeon: [name], MD;  Location: [facility] ENDOSCOPY;  Service: Cardiovascular;  Laterality: N/A;  10a CV  . TEE WITHOUT CARDIOVERSION  06/24/2012   Procedure: TRANSESOPHAGEAL ECHOCARDIOGRAM (TEE);  Surgeon: [name], MD;  Location: [facility] ENDOSCOPY;  Service: Cardiovascular;  Laterality: N/A;  . TEE WITHOUT CARDIOVERSION N/A 08/22/2017   Procedure: TRANSESOPHAGEAL ECHOCARDIOGRAM (TEE);  Surgeon: Chilton Si, MD;  Location: [facility] ENDOSCOPY;  Service: Cardiovascular;  Laterality: N/A;  . UMBILICAL HERNIA REPAIR     x 2       Home Medications    Prior to Admission medications   Medication Sig Start Date End Date Taking? Authorizing Provider  amLODipine (NORVASC) 5 MG tablet TAKE ONE TABLET BY MOUTH ONCE DAILY Patient taking differently: TAKE ONE TABLET BY MOUTH ONCE DAILY IN THE EVENING 07/19/16  Yes Allred, Fayrene Fearing, MD  ferrous sulfate 325 (65 FE) MG tablet Take 325 mg by mouth daily with breakfast.   Yes [provider]  folic acid (FOLVITE) 1 MG tablet Take 1 tablet (1 mg total) by mouth daily. Patient taking differently: Take 1 mg by mouth every evening.  08/15/17 08/15/18 Yes Levert Feinstein, MD  furosemide (LASIX) 40 MG tablet Take 20 mg by mouth  daily.   Yes [provider]  losartan (COZAAR) 100 MG tablet Take 0.5 tablets (50 mg total) by mouth 2 (two) times daily. 04/29/15  Yes Allred, Fayrene Fearing, MD  Methylcellulose, Laxative, (CITRUCEL) 500 MG TABS Take 500 mg by mouth daily.   Yes [provider]  metoprolol tartrate (LOPRESSOR) 25 MG tablet Take 1 tablet (25 mg total) by mouth 2 (two) times daily. 02/25/18  Yes Allred, Fayrene Fearing, MD  Multiple Vitamin (MULTIVITAMIN) tablet Take 1-2 tablets by mouth See admin instructions. Take 2 tablets in the morning and 1 tablet in the evening   Yes [provider]  omeprazole (PRILOSEC) 40 MG capsule Take 40 mg by mouth daily.  09/02/16  Yes [provider]  spironolactone (ALDACTONE) 50 MG tablet TAKE ONE TABLET BY MOUTH ONCE DAILY 07/19/16  Yes Allred, Fayrene Fearing, MD  vitamin E (VITAMIN E) 400 UNIT capsule Take 400 Units by mouth daily.   Yes [provider]  XARELTO 20 MG TABS tablet TAKE ONE TABLET BY MOUTH DAILY WITH  SUPPER Patient taking differently: TAKE  20 MG BY MOUTH DAILY WITH  SUPPER 07/19/16  Yes Allred, Fayrene Fearing, MD  azithromycin (ZITHROMAX) 250 MG tablet Take 1 tablet (250 mg total) by mouth daily. Take first 2 tablets together, then 1 every day until finished. 02/09/18   Cathie Hoops, Amy V, PA-C  ibuprofen (ADVIL,MOTRIN) 200 MG tablet Take 400 mg by mouth daily as needed for moderate pain.     [provider]  trolamine salicylate (ASPERCREME) 10 % cream Apply 1 application topically as needed for muscle pain.    [provider]    Family History Family History  Problem Relation Age of Onset  . Hypertension Mother   . Hypertension Father   . Heart attack Father 13       Died  . Diabetes Father   . Kidney disease Father        with transplant  . Stroke Paternal Grandmother     Social History Social History   Tobacco Use  . Smoking status: Never Smoker  . Smokeless tobacco: Former Neurosurgeon    Types: Chew  Substance Use Topics  . Alcohol  use: Yes    Comment: occasional  . Drug use: No     Allergies   Patient has no known allergies.   Review of Systems Review of Systems  Reason unable to perform ROS: See HPI as above.     Physical Exam Triage Vital Signs ED Triage Vitals  Enc Vitals Group     BP 02/09/18 1116 127/80     Pulse Rate 02/09/18 1116 67     Resp 02/09/18 1116 16     Temp 02/09/18 1116 97.8 F (36.6 C)     Temp Source 02/09/18 1116 Oral     SpO2 02/09/18 1116 96 %     Weight --      Height --      Head Circumference --      Peak Flow --      Pain Score 02/09/18 1119 0     Pain Loc --      Pain Edu? --      Excl. in GC? --    No data found.  Updated Vital Signs BP 127/80 (BP Location: Right Arm)   Pulse 67   Temp 97.8 F (36.6 C) (Oral)   Resp 16   SpO2 96%   Physical Exam  Constitutional: He is oriented to person, place, and time. He appears well-developed and well-nourished. No distress.  HENT:  Head: Normocephalic and atraumatic.  Right Ear: Tympanic membrane, external ear and ear canal normal. Tympanic membrane is not erythematous and not bulging.  Left Ear: Tympanic membrane, external ear and ear canal normal. Tympanic membrane is not erythematous and not bulging.  Nose: Nose normal. Right sinus exhibits no maxillary sinus tenderness and no frontal sinus tenderness. Left sinus exhibits no maxillary sinus tenderness and no frontal sinus tenderness.  Mouth/Throat: Uvula is midline, oropharynx is clear and moist and mucous membranes are normal.  Eyes: Pupils are equal, round, and reactive to light. Conjunctivae are normal.  Neck: Normal range of motion. Neck supple.  Cardiovascular: Normal rate, regular rhythm and normal heart sounds. Exam reveals no gallop and no friction rub.  No murmur heard. Pulmonary/Chest: Effort normal and breath sounds normal. No accessory muscle usage or stridor. No respiratory distress. He has no decreased breath sounds. He has no wheezes. He has no  rhonchi. He has no rales.  Lymphadenopathy:    He has no cervical adenopathy.  Neurological:  He is alert and oriented to person, place, and time.  Skin: Skin is warm and dry.  Psychiatric: He has a normal mood and affect. His behavior is normal. Judgment normal.    UC Treatments / Results  Labs (all labs ordered are listed, but only abnormal results are displayed) Labs Reviewed - No data to display  EKG None  Radiology Dg Chest 2 View  Result Date: 02/09/2018 CLINICAL DATA:  Productive cough 1 week with night sweats. Lingular pneumonia on recent CT 02/08/2018. EXAM: CHEST - 2 VIEW COMPARISON:  12/06/2017 FINDINGS: Lungs are adequately inflated with subtle opacification over the lingula as seen on recent CT likely early pneumonia. Cardiomediastinal silhouette and remainder of the exam is unchanged. IMPRESSION: Subtle airspace density over the lingula as seen on recent CT likely early pneumonia. Electronically Signed   By: Elberta Fortis M.D.   On: 02/09/2018 11:38   Ct Abdomen Pelvis W Contrast  Result Date: 02/08/2018 CLINICAL DATA:  Upper abdominal pain with diarrhea for 6 days. Bloating with decreased appetite and dehydration. History of gastric bypass, hernia repair. EXAM: CT ABDOMEN AND PELVIS WITH CONTRAST TECHNIQUE: Multidetector CT imaging of the abdomen and pelvis was performed using the standard protocol following bolus administration of intravenous contrast. CONTRAST:  ISOVUE-300 IOPAMIDOL (ISOVUE-300) INJECTION 61% COMPARISON:  CT 03/23/2016 and 02/12/2012. FINDINGS: Lower chest: There is patchy airspace disease in the lingula which is incompletely visualized and not well seen on today's scout image. The right lung base is clear. There is no significant pleural or pericardial effusion. There is a small hiatal hernia. Postsurgical changes are present from previous gastric bypass. Hepatobiliary: The liver is normal in density without focal abnormality. No evidence of gallstones,  gallbladder wall thickening or biliary dilatation. Pancreas: Appears mildly atrophied, but stable. There is no evidence of pancreatic ductal dilatation or surrounding inflammation. Spleen: Normal in size without focal abnormality. Adrenals/Urinary Tract: Both adrenal glands appear normal. There is a stable small cyst in the upper pole of the left kidney. The kidneys otherwise appear normal. No evidence of urinary tract calculus or hydronephrosis. The bladder appears normal. Stomach/Bowel: No evidence of bowel wall thickening, distention or surrounding inflammatory change. The appendix is not as well seen, although there is no pericecal inflammation. Postsurgical changes from previous gastric bypass, stable. Vascular/Lymphatic: There are no enlarged abdominal or pelvic lymph nodes. No significant vascular findings. Reproductive: The prostate gland and seminal vesicles appear normal. Other: No evidence of abdominal wall mass or hernia. No ascites. Musculoskeletal: No acute or significant osseous findings. There is stable anterior wedging of the L1 vertebral body. Bilateral gynecomastia noted. IMPRESSION: 1. Partially imaged lingular airspace disease suspicious for pneumonia. Radiographic correlation recommended. 2. No acute abdominopelvic findings or explanation for the patient's symptoms. 3. Stable appearance status post gastric bypass surgery. No evidence of bowel obstruction. There is a small hiatal hernia. Electronically Signed   By: Carey Bullocks M.D.   On: 02/08/2018 10:11    Procedures Procedures (including critical care time)  Medications Ordered in UC Medications - No data to display  Initial Impression / Assessment and Plan / UC Course  I have reviewed the triage vital signs and the nursing notes.  Pertinent labs & imaging results that were available during my care of the patient were reviewed by me and considered in my medical decision making (see chart for details).    Discussed chest  x-ray results with patient.  Patient nontoxic in appearance, exam unremarkable.  However, given CT scan  and chest x-ray showing left lower lobe pneumonia, will treat with azithromycin.  Other symptomatic treatment discussed.  Return precautions given.  Otherwise follow-up with primary care for further evaluation and management needed.  Patient expresses understanding and agrees to plan.  Final Clinical Impressions(s) / UC Diagnoses   Final diagnoses:  Pneumonia of left lower lobe due to infectious organism [facility])    ED Prescriptions    Medication Sig Dispense Auth. Provider   azithromycin (ZITHROMAX) 250 MG tablet Take 1 tablet (250 mg total) by mouth daily. Take first 2 tablets together, then 1 every day until finished. 6 tablet Threasa Alpha, [place] 02/09/18 1158

## 2018-02-11 ENCOUNTER — Other Ambulatory Visit: Payer: Self-pay | Admitting: Oncology

## 2018-02-11 DIAGNOSIS — K909 Intestinal malabsorption, unspecified: Secondary | ICD-10-CM

## 2018-02-11 DIAGNOSIS — Z9884 Bariatric surgery status: Secondary | ICD-10-CM

## 2018-02-11 DIAGNOSIS — D508 Other iron deficiency anemias: Secondary | ICD-10-CM

## 2018-02-12 ENCOUNTER — Other Ambulatory Visit: Payer: BLUE CROSS/BLUE SHIELD

## 2018-02-12 ENCOUNTER — Other Ambulatory Visit: Payer: Self-pay | Admitting: Oncology

## 2018-02-12 ENCOUNTER — Encounter: Payer: Self-pay | Admitting: *Deleted

## 2018-02-12 DIAGNOSIS — K909 Intestinal malabsorption, unspecified: Secondary | ICD-10-CM | POA: Diagnosis not present

## 2018-02-12 NOTE — Progress Notes (Signed)
02-12-18  10:10  Received a phone call from Shirlean Mylar, wife of Jevonte Clanton, requesting lab orders to be faxed to [facility] [facility]. This location is more convenient for Mr Kina. Lab orders (CBC,Diff and Ferritin) faxed to [phone] at 10:15 02-12-18  I requested [facility] fax results back to [place] at [phone].  Maryan Rued, PBT 02-12-18  10:20

## 2018-02-13 ENCOUNTER — Other Ambulatory Visit: Payer: Self-pay | Admitting: *Deleted

## 2018-02-13 DIAGNOSIS — K909 Intestinal malabsorption, unspecified: Secondary | ICD-10-CM

## 2018-02-13 LAB — CBC WITH DIFFERENTIAL/PLATELET
Basophils Absolute: 0.1 10*3/uL (ref 0.0–0.2)
Basos: 1 %
EOS (ABSOLUTE): 0.2 10*3/uL (ref 0.0–0.4)
Eos: 3 %
Hematocrit: 37.2 % — ABNORMAL LOW (ref 37.5–51.0)
Hemoglobin: 12.1 g/dL — ABNORMAL LOW (ref 13.0–17.7)
Immature Grans (Abs): 0 10*3/uL (ref 0.0–0.1)
Immature Granulocytes: 0 %
Lymphocytes Absolute: 1.6 10*3/uL (ref 0.7–3.1)
Lymphs: 34 %
MCH: 32.5 pg (ref 26.6–33.0)
MCHC: 32.5 g/dL (ref 31.5–35.7)
MCV: 100 fL — ABNORMAL HIGH (ref 79–97)
Monocytes Absolute: 0.4 10*3/uL (ref 0.1–0.9)
Monocytes: 8 %
Neutrophils Absolute: 2.5 10*3/uL (ref 1.4–7.0)
Neutrophils: 54 %
Platelets: 191 10*3/uL (ref 150–450)
RBC: 3.72 x10E6/uL — ABNORMAL LOW (ref 4.14–5.80)
RDW: 11.7 % — ABNORMAL LOW (ref 12.3–15.4)
WBC: 4.6 10*3/uL (ref 3.4–10.8)

## 2018-02-13 LAB — FERRITIN: Ferritin: 212 ng/mL (ref 30–400)

## 2018-02-18 ENCOUNTER — Ambulatory Visit (INDEPENDENT_AMBULATORY_CARE_PROVIDER_SITE_OTHER): Payer: BLUE CROSS/BLUE SHIELD | Admitting: Neurology

## 2018-02-18 ENCOUNTER — Encounter: Payer: Self-pay | Admitting: Neurology

## 2018-02-18 VITALS — BP 140/93 | HR 68 | Ht 66.0 in | Wt 203.0 lb

## 2018-02-18 DIAGNOSIS — Z9884 Bariatric surgery status: Secondary | ICD-10-CM

## 2018-02-18 DIAGNOSIS — I480 Paroxysmal atrial fibrillation: Secondary | ICD-10-CM

## 2018-02-18 DIAGNOSIS — E669 Obesity, unspecified: Secondary | ICD-10-CM

## 2018-02-18 DIAGNOSIS — G4733 Obstructive sleep apnea (adult) (pediatric): Secondary | ICD-10-CM

## 2018-02-18 NOTE — Patient Instructions (Signed)

## 2018-02-18 NOTE — Progress Notes (Signed)
Subjective:    Patient ID: Tyrone [name] is a 52 y.o. male.  HPI     [name], MD, PhD [facility] [address], [address] [address] [place], [place] [address]  Dear Dr. Hyman Hopes,   I saw your patient, Tyrone [name], upon your kind request in my neurologic clinic today for initial consultation of his sleep disorder, in particular, re-evaluation of his prior diagnosis of obstructive sleep apnea. The patient is unaccompanied today. As you know, Tyrone [name] is a 52 year old right-handed gentleman with an underlying complex medical history of A. fib, cardiomyopathy, hypertension, obesity, status post gastric bypass in 2004 with over 200 pound weight loss, reflux disease, vitamin D deficiency, history of stroke, history of Guillain-Barr syndrome, arthritis, anemia, vitamin B12 deficiency, who was previously diagnosed with obstructive sleep apnea and placed on CPAP therapy. Sleep study testing was several years ago, prior sleep study results are not available for my review today. A CPAP compliance download is not possible from his machine as it is older. I reviewed your office note from 12/04/2017, which you kindly included. For his A. fib, he had cardioversion in the past and is status post ablation in 2013 and most recently in March 2019. His bedtime is generally around 11 or 11:30 but typically, he has already had a nap on the sofa before he goes to bed. Rise time is generally between 5:30 and 6. He has nocturia about once per average night, denies restless leg symptoms or morning headaches. He has no family history of OSA. He had a tonsillectomy as a child. His original sleep apnea diagnosis as far as he recalls was severe. He then had gastric bypass and lost a lot of weight, had another sleep study maybe 7 or 8 years ago at which time his pressure was reduced. He believes that his original treatment pressure was around 15 cm and his current pressure is around 7 or 8 cm. He uses a nasal  mask successfully. His Epworth sleepiness score is 7 out of 24, fatigue score is 30 out of 63. He is married and lives with his wife, they have 1 child, he quit chewing tobacco in 2004. A consult call occasionally, caffeine daily, 2-3 servings per day on average.  His Past Medical History Is Significant For: Past Medical History:  Diagnosis Date  . Anemia, iron deficiency 01/30/2017  . Cervicalgia   . CVA (cerebral infarction) 05/01/12   on pradaxa at time of stroke, switched to xarelto  . External thrombosed hemorrhoids   . GERD (gastroesophageal reflux disease)   . Guillain Barr syndrome [facility])    following seasonal influenza vaccination  . Guillain-Barre syndrome (HCC)    from stroke in 2013  . Hematuria    a. 01/2012 - to f/u with urology as outpt.  Marland Kitchen Hernia of unspecified site of abdominal cavity without mention of obstruction or gangrene    of abdominal  . Hx of gastric bypass 01/30/2017   2004 Dr Clent Ridges [facility] weight 450 lbs  . Hypertension   . Iron malabsorption 01/30/2017   Hx gastric bypass 2004  . Morbid obesity s/p gastric bypass   . Nonischemic cardiomyopathy (HCC)    a. 01/2012 Echo: EF 35-40%, Diff HK, mild MR;  b. 01/2012 R&L Heart Cath: mildly elevated R heart pressures (pcwp 17) , nl cors.  . Osteoarthrosis, unspecified whether generalized or localized, ankle and foot    of the knees,ankle and foot  . Pancytopenia   .  Persistent atrial fibrillation (HCC)    a. Pradaxa started 01/2012;  b .s/p TEE/DCCV 02/16/2012; c. recurrent afib->Tikosyn Initiation 03/2012  . Sleep apnea    CPAP compliant  . Status post bypass gastrojejunostomy 01/30/2017  . Stroke (HCC)   . Unspecified intestinal malabsorption   . Urticaria, unspecified   . Vitamin B 12 deficiency     His Past Surgical History Is Significant For: Past Surgical History:  Procedure Laterality Date  . ATRIAL FIBRILLATION ABLATION N/A 06/25/2012   PVI by Dr [name]  . ATRIAL FIBRILLATION ABLATION N/A  10/04/2017   Procedure: ATRIAL FIBRILLATION ABLATION;  Surgeon: Hillis Range, MD;  Location: [facility] INVASIVE CV LAB;  Service: Cardiovascular;  Laterality: N/A;  . CARDIOVERSION  02/16/2012   Procedure: CARDIOVERSION;  Surgeon: [name], MD;  Location: [facility] ENDOSCOPY;  Service: Cardiovascular;  Laterality: N/A;  . CARDIOVERSION  04/22/2012   Procedure: CARDIOVERSION;  Surgeon: [name], MD;  Location: [facility] ENDOSCOPY;  Service: Cardiovascular;  Laterality: N/A;  . CARDIOVERSION N/A 08/22/2017   Procedure: CARDIOVERSION;  Surgeon: Chilton Si, MD;  Location: [name] ENDOSCOPY;  Service: Cardiovascular;  Laterality: N/A;  . COLONOSCOPY N/A 07/07/2016   Procedure: COLONOSCOPY;  Surgeon: Jeani Hawking, MD;  Location: [facility] ENDOSCOPY;  Service: Endoscopy;  Laterality: N/A;  . ESOPHAGOGASTRODUODENOSCOPY N/A 07/07/2016   Procedure: ESOPHAGOGASTRODUODENOSCOPY (EGD);  Surgeon: Jeani Hawking, MD;  Location: [facility] ENDOSCOPY;  Service: Endoscopy;  Laterality: N/A;  . GASTRIC BYPASS  2004  . HERNIA REPAIR    . LEFT AND RIGHT HEART CATHETERIZATION WITH CORONARY ANGIOGRAM N/A 02/13/2012   Procedure: LEFT AND RIGHT HEART CATHETERIZATION WITH CORONARY ANGIOGRAM;  Surgeon: Tonny Bollman, MD;  Location: [facility] CATH LAB;  Service: Cardiovascular;  Laterality: N/A;  . TEE WITHOUT CARDIOVERSION  02/16/2012   Procedure: TRANSESOPHAGEAL ECHOCARDIOGRAM (TEE);  Surgeon: [name], MD;  Location: [facility] ENDOSCOPY;  Service: Cardiovascular;  Laterality: N/A;  10a CV  . TEE WITHOUT CARDIOVERSION  06/24/2012   Procedure: TRANSESOPHAGEAL ECHOCARDIOGRAM (TEE);  Surgeon: [name], MD;  Location: [facility] ENDOSCOPY;  Service: Cardiovascular;  Laterality: N/A;  . TEE WITHOUT CARDIOVERSION N/A 08/22/2017   Procedure: TRANSESOPHAGEAL ECHOCARDIOGRAM (TEE);  Surgeon: Chilton Si, MD;  Location: [facility] ENDOSCOPY;  Service: Cardiovascular;  Laterality: N/A;  . UMBILICAL HERNIA REPAIR     x 2    His Family History Is Significant  For: Family History  Problem Relation Age of Onset  . Hypertension Mother   . Hypertension Father   . Heart attack Father 32       Died  . Diabetes Father   . Kidney disease Father        with transplant  . Stroke Paternal Grandmother     His Social History Is Significant For: Social History   Socioeconomic History  . Marital status: Married    Spouse name: Not on file  . Number of children: 1  . Years of education: Not on file  . Highest education level: Not on file  Occupational History  . Occupation: Veterinary surgeon  Social Needs  . Financial resource strain: Not on file  . Food insecurity:    Worry: Not on file    Inability: Not on file  . Transportation needs:    Medical: Not on file    Non-medical: Not on file  Tobacco Use  . Smoking status: Never Smoker  . Smokeless tobacco: Former Neurosurgeon    Types: Chew  Substance and Sexual Activity  . Alcohol use: Yes    Comment: occasional  .  Drug use: No  . Sexual activity: Yes  Lifestyle  . Physical activity:    Days per week: Not on file    Minutes per session: Not on file  . Stress: Not on file  Relationships  . Social connections:    Talks on phone: Not on file    Gets together: Not on file    Attends religious service: Not on file    Active member of club or organization: Not on file    Attends meetings of clubs or organizations: Not on file    Relationship status: Not on file  Other Topics Concern  . Not on file  Social History Narrative   Lives with wife.  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 amLODipine (NORVASC) 5 MG tablet TAKE ONE TABLET BY MOUTH ONCE DAILY (Patient taking differently: TAKE ONE TABLET BY MOUTH ONCE DAILY IN THE EVENING)  . azithromycin (ZITHROMAX) 250 MG tablet Take 1 tablet (250 mg total) by mouth daily. Take first 2 tablets  together, then 1 every day until finished.  . ferrous sulfate 325 (65 FE) MG tablet Take 325 mg by mouth daily with breakfast.  . folic acid (FOLVITE) 1 MG tablet Take 1 tablet (1 mg total) by mouth daily. (Patient taking differently: Take 1 mg by mouth every evening. )  . furosemide (LASIX) 40 MG tablet Take 20 mg by mouth daily.  Marland Kitchen ibuprofen (ADVIL,MOTRIN) 200 MG tablet Take 400 mg by mouth daily as needed for moderate pain.   Marland Kitchen losartan (COZAAR) 100 MG tablet Take 0.5 tablets (50 mg total) by mouth 2 (two) times daily.  . Methylcellulose, Laxative, (CITRUCEL) 500 MG TABS Take 500 mg by mouth daily.  Melene Muller ON 02/25/2018] metoprolol tartrate (LOPRESSOR) 25 MG tablet Take 1 tablet (25 mg total) by mouth 2 (two) times daily. (Patient taking differently: Take 50 mg by mouth 2 (two) times daily. )  . Multiple Vitamin (MULTIVITAMIN) tablet Take 1-2 tablets by mouth See admin instructions. Take 2 tablets in the morning and 1 tablet in the evening  . omeprazole (PRILOSEC) 40 MG capsule Take 40 mg by mouth daily.   Marland Kitchen spironolactone (ALDACTONE) 50 MG tablet TAKE ONE TABLET BY MOUTH ONCE DAILY  . trolamine salicylate (ASPERCREME) 10 % cream Apply 1 application topically as needed for muscle pain.  . vitamin E (VITAMIN E) 400 UNIT capsule Take 400 Units by mouth daily.  [name] Hurl 20 MG TABS tablet TAKE ONE TABLET BY MOUTH DAILY WITH  SUPPER (Patient taking differently: TAKE 20 MG BY MOUTH DAILY WITH  SUPPER)   Facility-Administered Encounter Medications as of 02/18/2018  Medication  . gadopentetate dimeglumine (MAGNEVIST) injection 20 mL  :  Review of Systems:  Out of a complete 14 point review of systems, all are reviewed and negative with the exception of these symptoms as listed below:  Questions diseases is up-to-date or he wants Review of Systems  Neurological:       Pt presents today to discuss his sleep. Pt's cpap is about 53 years old and pt needs a new cpap. Pt is using Tyrone [name].  Epworth  Sleepiness Scale 0= would never doze 1= slight chance of dozing 2= moderate chance of dozing 3= high chance of dozing  Sitting and reading: 1 Watching TV: 1 Sitting inactive in a  public place (ex. Theater or meeting): 0 As a passenger in a car for an hour without a break: 1 Lying down to rest in the afternoon: 2 Sitting and talking to someone: 1 Sitting quietly after lunch (no alcohol): 1 In a car, while stopped in traffic: 0 Total: 7     Objective:  Neurological Exam  Physical Exam Physical Examination:   Vitals:   02/18/18 1547  BP: (!) 140/93  Pulse: 68    General Examination: The patient is a very pleasant 52 y.o. male in no acute distress. He appears well-developed and well-nourished and well groomed.   HEENT: Normocephalic, atraumatic, pupils are equal, round and reactive to light and accommodation. Corrective eyeglasses in place. Extraocular tracking is good without limitation to gaze excursion or nystagmus noted. Normal smooth pursuit is noted. Hearing is grossly intact. Face is symmetric with normal facial animation and normal facial sensation. Speech is clear with no dysarthria noted. There is no hypophonia. There is no lip, neck/head, jaw or voice tremor. Neck is supple with full range of passive and active motion. There are no carotid bruits on auscultation. Oropharynx exam reveals: mild mouth dryness, adequate dental hygiene and mild airway crowding, due to redundant soft palate and longer uvula. Mallampati is class II. Tonsils are absent. Neck circumference is 15-1/4 inches. Tongue protrudes centrally and palate elevates symmetrically.   Chest: Clear to auscultation without wheezing, rhonchi or crackles noted.  Heart: S1+S2+0, regular and normal without murmurs, rubs or gallops noted.   Abdomen: Soft, non-tender and non-distended with normal bowel sounds appreciated on auscultation.  Extremities: There is trace pitting edema in the distal lower extremities  bilaterally.   Skin: Warm and dry without trophic changes noted.  Musculoskeletal: exam reveals no obvious joint deformities, tenderness or joint swelling or erythema.   Neurologically:  Mental status: The patient is awake, alert and oriented in all 4 spheres. His immediate and remote memory, attention, language skills and fund of knowledge are appropriate. There is no evidence of aphasia, agnosia, apraxia or anomia. Speech is clear with normal prosody and enunciation. Thought process is linear. Mood is normal and affect is normal.  Cranial nerves II - XII are as described above under HEENT exam. In addition: shoulder shrug is normal with equal shoulder height noted. Motor exam: Normal bulk, strength and tone is noted. There is no drift, tremor or rebound. Romberg is negative, except minimal sway. Fine motor skills and coordination: grossly intact.  Cerebellar testing: No dysmetria or intention tremor. There is no truncal or gait ataxia.  Sensory exam: intact to light touch.  Gait, station and balance: He stands easily. No veering to one side is noted. No leaning to one side is noted. Posture is age-appropriate and stance is narrow based. Gait shows normal stride length and normal pace. No problems turning are noted. Tandem walk is unremarkable.   Assessment and Plan:  In summary, Tyrone [name] is a very pleasant 52 y.o.-year old male with an underlying complex medical history of A. fib, cardiomyopathy, hypertension, obesity, status post gastric bypass in 2004 with over 200 pound weight loss, reflux disease, vitamin D deficiency, history of stroke, history of Guillain-Barr syndrome, arthritis, anemia, vitamin B12 deficiency, who presents for evaluation of his obstructive sleep apnea. He was originally diagnosed several years ago with severe sleep apnea, had interim weight loss surgery with successful weight loss of over 200 pounds, he would benefit from reevaluation as he has not had new equipment or  reevaluation in  7-8 years. To that end, I would like to bring him in for a lab attendant sleep study with a split-night sleep study.  I had a long chat with the patient about my findings and the diagnosis of OSA, its prognosis and treatment options. We talked about medical treatments, surgical interventions and non-pharmacological approaches. I explained in particular the risks and ramifications of untreated moderate to severe OSA, especially with respect to developing cardiovascular disease down the Road, including congestive heart failure, difficult to treat hypertension, cardiac arrhythmias, or stroke. Even type 2 diabetes has, in part, been linked to untreated OSA. Symptoms of untreated OSA include daytime sleepiness, memory problems, mood irritability and mood disorder such as depression and anxiety, lack of energy, as well as recurrent headaches, especially morning headaches. We talked about trying to maintain a healthy lifestyle in general, as well as the importance of weight control. I encouraged the patient to eat healthy, exercise daily and keep well hydrated, to keep a scheduled bedtime and wake time routine, to not skip any meals and eat healthy snacks in between meals. I advised the patient not to drive when feeling sleepy.  I recommended the following at this time: sleep study with potential positive airway pressure titration. (We will score hypopneas at 3%).   I explained the sleep test procedure to the patient and also outlined possible surgical and non-surgical treatment options of OSA, including the ongoing use of CPAP. He would be willing to continue with CPAP if the need arises.  I answered all his questions today and the patient was in agreement. I would like to see him back after the sleep study is completed and encouraged him to call with any interim questions, concerns, problems or updates.   Thank you very much for allowing me to participate in the care of this nice patient. If I can  be of any further assistance to you please do not hesitate to call me at [phone].  Sincerely,   [name], MD, PhD

## 2018-02-19 ENCOUNTER — Encounter: Payer: Self-pay | Admitting: Oncology

## 2018-02-19 ENCOUNTER — Ambulatory Visit (INDEPENDENT_AMBULATORY_CARE_PROVIDER_SITE_OTHER): Payer: BLUE CROSS/BLUE SHIELD | Admitting: Oncology

## 2018-02-19 ENCOUNTER — Other Ambulatory Visit: Payer: Self-pay

## 2018-02-19 VITALS — BP 120/80 | HR 64 | Temp 98.3°F | Ht 66.0 in | Wt 204.9 lb

## 2018-02-19 DIAGNOSIS — R634 Abnormal weight loss: Secondary | ICD-10-CM | POA: Diagnosis not present

## 2018-02-19 DIAGNOSIS — D508 Other iron deficiency anemias: Secondary | ICD-10-CM

## 2018-02-19 DIAGNOSIS — K909 Intestinal malabsorption, unspecified: Secondary | ICD-10-CM

## 2018-02-19 DIAGNOSIS — Z79899 Other long term (current) drug therapy: Secondary | ICD-10-CM

## 2018-02-19 DIAGNOSIS — K209 Esophagitis, unspecified: Secondary | ICD-10-CM | POA: Diagnosis not present

## 2018-02-19 DIAGNOSIS — Z6833 Body mass index (BMI) 33.0-33.9, adult: Secondary | ICD-10-CM

## 2018-02-19 DIAGNOSIS — I481 Persistent atrial fibrillation: Secondary | ICD-10-CM

## 2018-02-19 DIAGNOSIS — Z9884 Bariatric surgery status: Secondary | ICD-10-CM

## 2018-02-19 NOTE — Patient Instructions (Signed)
Return for lab on 05/27/18 MD visit 1 week after lab  Please schedule new patient visit with Dr Malachy Mood at [facility] after 1/120: Hx gastric bypass; iron malabsorption; requires periodic iron infusion

## 2018-02-19 NOTE — Progress Notes (Signed)
Hematology and Oncology Follow Up Visit  Tyrone [name] [id number] April 27, 1966 52 y.o. 02/19/2018 10:23 AM   Principle Diagnosis: Encounter Diagnoses  Name Primary?  . Iron malabsorption Yes  . Hx of gastric bypass   . 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 Clent [name] at [facility] for weight loss control. Preoperative weight 450 pounds. In December 2017 hemoglobin fell to 9.6 with MCV 73. Previous values recorded in our system back as far as July 2013 showed hemoglobin at that time 13 g with MCV 93.5. Hemoglobin remained in the 11-12 gram range until November 2017. He had recently experienced an episode of profuse epistaxis. He had an unintentional approximate 60 pound weight loss. He was on anticoagulation at that time for chronic atrial fibrillation. Ear nose and throat evaluation showed no significant pathology. He was found to have a septal granuloma which was cauterized. He subsequently underwent upper endoscopy and colonoscopy on 07/07/2016. Upper endoscopy showed mild distal esophagitis. The Roux-en-Y anastomosis appeared normal. No ulcer. Colonoscopy was totally normal. He was put on Prilosecandhis heartburn symptoms resolved. At time of recent follow-up visit with his primary care physician on May 23,2018,ferritin markedly decreased at 9 despite oral iron replacement. B12 level was greater than 1500 on oral B12. BUN 9. Creatinine 0.8. He has had post prandial loose bowel movements up to 6 times daily since his bypass surgery.  Subsequent to his first visit with me in July 2018, he received 1020 mg of intravenous iron.  Hemoglobin rose from 9.5 with MCV 78 peak value 13.6 with MCV 100 to by January 2019.  Ferritin rose into the normal range with most recent value 212 on February 12, 2018.    Interim History: He recently had a radiofrequency ablation procedure in March  2019 to treat persistent atrial fibrillation which relapsed after attempt at DC cardioversion.  Currently no cardiopulmonary symptoms. He is concerned with persistent unintentional weight loss.  He initially thought he was losing weight due to changing to a healthier diet.  Current weight 205 pounds.  Weight was 230 pounds in July 2018.  He continues to have 6-8 loose bowel movements daily.  He occasionally notices some blood in the stools.  B12 level normal in January.  Folic acid level decreased and I prescribed oral folic acid at that time.  He saw his gastroenterologist recently.  She voiced no major concerns but is considering doing a short interval repeat colonoscopy if he continues to lose weight.  Last colonoscopy unremarkable 2 years ago. His primary care physician obtained a CT scan of the abdomen and pelvis on July 19.  I reviewed the images with the patient and his wife.  There is no obvious intra-abdominal pathology.  No liver or pancreatic mass.  Most recent chemistry profile including liver functions done on August 14, 2017 was normal.  Incidentally seen was a patchy inflammatory infiltrate in the left lung.  He had minimal symptoms but was treated for outpatient pneumonia.  I reviewed these images with the patient and his wife as well.  Medications: reviewed  Allergies: No Known Allergies  Review of Systems: See interim history Remaining ROS negative:   Physical Exam: Blood pressure 120/80, pulse 64, temperature 98.3 F (36.8 C), temperature source Oral, height 5\' 6"  (1.676 m), weight 204 lb 14.4 oz (92.9 kg), SpO2 97 %. Wt Readings from Last 3 Encounters:  02/19/18 204 lb 14.4 oz (92.9  kg)  02/18/18 203 lb (92.1 kg)  01/14/18 201 lb (91.2 kg)     General appearance: Well-nourished Caucasian man [name], exudate, mass, [name] Lymph nodes: No cervical, supraclavicular, or axillary lymphadenopathy Breasts:  Lungs: Clear  to auscultation, resonant to percussion throughout Heart: Regular rhythm, no murmur, no gallop, no rub, no click, no edema Abdomen: Soft, nontender, normal bowel sounds, no mass, no organomegaly Extremities: No edema, no calf tenderness Musculoskeletal: no joint deformities GU:  Vascular: Carotid pulses 2+, no bruits,  Neurologic: Alert, oriented, PERRLA, , cranial nerves grossly normal, motor strength 5 over 5, reflexes 1+ symmetric, upper body coordination normal, gait normal, Skin: No rash or ecchymosis  Lab Results: CBC W/Diff    Component Value Date/Time   WBC 4.6 02/12/2018 1524   WBC 5.4 06/05/2016 1613   WBC 3.9 (L) 10/29/2014 0911   RBC 3.72 (L) 02/12/2018 1524   RBC 3.87 (A) 06/05/2016 1613   RBC 3.93 (L) 10/29/2014 0911   HGB 12.1 (L) 02/12/2018 1524   HCT 37.2 (L) 02/12/2018 1524   PLT 191 02/12/2018 1524   MCV 100 (H) 02/12/2018 1524   MCH 32.5 02/12/2018 1524   MCH 24.9 (A) 06/05/2016 1613   MCH 28.2 10/29/2014 0911   MCHC 32.5 02/12/2018 1524   MCHC 33.9 06/05/2016 1613   MCHC 32.6 10/29/2014 0911   RDW 11.7 (L) 02/12/2018 1524   LYMPHSABS 1.6 02/12/2018 1524   MONOABS 0.2 06/18/2012 0911   EOSABS 0.2 02/12/2018 1524   BASOSABS 0.1 02/12/2018 1524     Chemistry      Component Value Date/Time   NA 142 09/27/2017 1144   K 4.4 09/27/2017 1144   CL 104 09/27/2017 1144   CO2 26 09/27/2017 1144   BUN 11 09/27/2017 1144   CREATININE 0.76 09/27/2017 1144   CREATININE 0.80 04/12/2016 1441      Component Value Date/Time   CALCIUM 8.7 09/27/2017 1144   ALKPHOS 77 08/14/2017 0936   AST 25 08/14/2017 0936   ALT 18 08/14/2017 0936   BILITOT 0.6 08/14/2017 0936       Radiological Studies: Dg Chest 2 View  Result Date: 02/09/2018 CLINICAL DATA:  Productive cough 1 week with night sweats. Lingular pneumonia on recent CT 02/08/2018. EXAM: CHEST - 2 VIEW COMPARISON:  12/06/2017 FINDINGS: Lungs are adequately inflated with subtle opacification over the lingula  as seen on recent CT likely early pneumonia. Cardiomediastinal silhouette and remainder of the exam is unchanged. IMPRESSION: Subtle airspace density over the lingula as seen on recent CT likely early pneumonia. Electronically Signed   By: Elberta Fortis M.D.   On: 02/09/2018 11:38   Ct Abdomen Pelvis W Contrast  Result Date: 02/08/2018 CLINICAL DATA:  Upper abdominal pain with diarrhea for 6 days. Bloating with decreased appetite and dehydration. History of gastric bypass, hernia repair. EXAM: CT ABDOMEN AND PELVIS WITH CONTRAST TECHNIQUE: Multidetector CT imaging of the abdomen and pelvis was performed using the standard protocol following bolus administration of intravenous contrast. CONTRAST:  ISOVUE-300 IOPAMIDOL (ISOVUE-300) INJECTION 61% COMPARISON:  CT 03/23/2016 and 02/12/2012. FINDINGS: Lower chest: There is patchy airspace disease in the lingula which is incompletely visualized and not well seen on today's scout image. The right lung base is clear. There is no significant pleural or pericardial effusion. There is a small hiatal hernia. Postsurgical changes are present from previous gastric bypass. Hepatobiliary: The liver is normal in density without focal abnormality. No evidence  of gallstones, gallbladder wall thickening or biliary dilatation. Pancreas: Appears mildly atrophied, but stable. There is no evidence of pancreatic ductal dilatation or surrounding inflammation. Spleen: Normal in size without focal abnormality. Adrenals/Urinary Tract: Both adrenal glands appear normal. There is a stable small cyst in the upper pole of the left kidney. The kidneys otherwise appear normal. No evidence of urinary tract calculus or hydronephrosis. The bladder appears normal. Stomach/Bowel: No evidence of bowel wall thickening, distention or surrounding inflammatory change. The appendix is not as well seen, although there is no pericecal inflammation. Postsurgical changes from previous gastric bypass,  stable. Vascular/Lymphatic: There are no enlarged abdominal or pelvic lymph nodes. No significant vascular findings. Reproductive: The prostate gland and seminal vesicles appear normal. Other: No evidence of abdominal wall mass or hernia. No ascites. Musculoskeletal: No acute or significant osseous findings. There is stable anterior wedging of the L1 vertebral body. Bilateral gynecomastia noted. IMPRESSION: 1. Partially imaged lingular airspace disease suspicious for pneumonia. Radiographic correlation recommended. 2. No acute abdominopelvic findings or explanation for the patient's symptoms. 3. Stable appearance status post gastric bypass surgery. No evidence of bowel obstruction. There is a small hiatal hernia. Electronically Signed   By: Carey Bullocks M.D.   On: 02/08/2018 10:11    Impression:  1.  Iron and mineral malabsorption following previous gastric bypass surgery. He is holding onto the iron which he received last July with mid range normal ferritin at this time.  This is an excellent response.  However hemoglobin which peaked at 13.6 g has leveled out at 12 g and now macrocytic indices.  There may still be an element of vitamin malabsorption and I believe in general his unintentional weight loss likely reflects malabsorption of nutrients. Given recent evaluation above, low suspicion that he has an underlying malignancy and more likely that we are dealing with malabsorption. I told him to make sure that his vitamin regimen included fat-soluble vitamins A, D, E, and  K.  I mentioned that he might benefit from pancreatic enzymes and he told me he was already on these.  I suggested that he increase his protein intake. I will leave any further evaluation of malabsorption to his gastroenterologist. I will continue to monitor his CBC and ferritin over time.  I will see him again in 4 months.  Repeat vitamin studies at  that time to make sure his folic acid levels are replete.  Consider checking  vitamin and copper levels as well.  Macrocytosis from copper deficiency can masquerade as myelodysplastic syndrome.  I will see him one more time and then transition his care to Dr.Yan Mosetta Putt at the [facility] lung [facility] center.  2.  Persistent atrial fibrillation now status post ablation procedure 3/19  3.  Unintentional weight loss. See discussion above under #1.  CC: Patient Care Team: Shirlean Mylar, MD as PCP - General (Family Medicine) Hillis Range, MD as PCP - Electrophysiology (Cardiology) Charna Elizabeth, MD as Consulting Physician (Gastroenterology)   Cephas Darby, MD, FACP  Hematology-Oncology/Internal Medicine     7/30/201910:23 AM

## 2018-02-20 ENCOUNTER — Telehealth: Payer: Self-pay

## 2018-02-20 DIAGNOSIS — G4733 Obstructive sleep apnea (adult) (pediatric): Secondary | ICD-10-CM

## 2018-02-20 NOTE — Telephone Encounter (Signed)
Insurance has denied in lab study request. Please order HST. Thanks! 

## 2018-02-20 NOTE — Telephone Encounter (Signed)
VO for HST from Dr. Athar received. HST order placed.  

## 2018-02-20 NOTE — Addendum Note (Signed)
Addended by: Geronimo Running A on: 02/20/2018 05:06 PM   Modules accepted: Orders

## 2018-03-13 ENCOUNTER — Institutional Professional Consult (permissible substitution): Payer: BLUE CROSS/BLUE SHIELD | Admitting: Neurology

## 2018-03-14 ENCOUNTER — Encounter

## 2018-03-18 ENCOUNTER — Ambulatory Visit: Payer: BLUE CROSS/BLUE SHIELD | Admitting: Neurology

## 2018-03-18 DIAGNOSIS — G4733 Obstructive sleep apnea (adult) (pediatric): Secondary | ICD-10-CM

## 2018-03-20 ENCOUNTER — Telehealth: Payer: Self-pay

## 2018-03-20 NOTE — Addendum Note (Signed)
Addended by: [name] on: 03/20/2018 08:39 AM   Modules accepted: Orders

## 2018-03-20 NOTE — Progress Notes (Signed)
Patient referred by Dr. Hyman Hopes, seen by me on 02/18/18, HST on 03/18/18 (for re-eval and new equipm).    Please call and notify the patient that the recent home sleep test showed obstructive sleep apnea in the moderate range. While I recommend treatment for this in the form CPAP, his insurance will not approve a sleep study for this. They will likely only approve a trial of autoPAP, which means, that we don't have to bring him in for a sleep study with CPAP, but will let him try an autoPAP machine at home, through a DME company (of his choice, or as per insurance requirement, he may have existing DME). The DME representative will educate him on how to use the machine, how to put the mask on, etc. I have placed an order in the chart. Please send referral, talk to patient, send report to referring MD. We will need a FU in sleep clinic for 10 weeks post-PAP set up, please arrange that with me or one of our NPs. Thanks,   [name], MD, PhD [facility])

## 2018-03-20 NOTE — Procedures (Signed)
[facility] @[facility] [address] [place], [place] [address] NAME:  Tyrone [name]                                                                 DOB: 03/20/1966 MEDICAL RECORD NUMBER [id number]                                         DOS:  03/18/18 REFERRING PHYSICIAN: Shirlean Mylar, MD STUDY PERFORMED: Home Sleep Test (Watch PAT) HISTORY: 52 year old man with a history of A. fib, cardiomyopathy, hypertension, obesity with s/p gastric bypass in 2004, reflux disease, vitamin D deficiency, history of stroke, history of Guillain-Barr syndrome, arthritis, anemia, vitamin B12 deficiency, who was previously diagnosed with OSA and placed on CPAP therapy. He requires re-evaluation and new equipment. Epworth sleepiness score is 7 out of 24, BMI: 32.6.  STUDY RESULTS:  Total Recording Time: 8 hours, 29 minutes (valid test time: 6 hours, 52 minutes) Total Apnea/Hypopnea Index (AHI): 26.1/h, RDI: 26.8/h Average Oxygen Saturation: 94%, Lowest Oxygen Desaturation: 86%  Total Time Oxygen Saturation Below or at 88%: 0.8 minutes  Average Heart Rate: 57 bpm (between 49 and 99  bpm) IMPRESSION: OSA RECOMMENDATION: This home sleep test demonstrates moderate obstructive sleep apnea, with a total AHI of 26.1/hour and O2 nadir of 86%. Given the patient's medical history and sleep related complaints, treatment with positive airway pressure (in the form of CPAP) is recommended. This will require a full night CPAP titration study for proper treatment settings, O2 monitoring and mask fitting. Based on the severity of the sleep disordered breathing an attended titration study is indicated. However, patient's insurance has denied an attended sleep study; therefore, the patient will be advised to proceed with an autoPAP titration/trial at home for now. Please note that untreated obstructive sleep apnea may carry additional perioperative morbidity. Patients with significant obstructive sleep apnea should receive  perioperative PAP therapy and the surgeons and particularly the anesthesiologist should be informed of the diagnosis and the severity of the sleep disordered breathing. The patient should be cautioned not to drive, work at heights, or operate dangerous or heavy equipment when tired or sleepy. Review and reiteration of good sleep hygiene measures should be pursued with any patient. Other causes of the patient's symptoms, including circadian rhythm disturbances, an underlying mood disorder, medication effect and/or an underlying medical problem cannot be ruled out based on this test. Clinical correlation is recommended. The patient and his referring provider will be notified of the test results. The patient will be seen in follow up in sleep clinic at [facility]. I certify that I have reviewed the raw data recording prior to the issuance of this report in accordance with the standards of the American Academy of Sleep Medicine (AASM).  [name], MD, PhD [facility] ([employer], Diplomat, ABPN in Neurology and Sleep Medicine)

## 2018-03-20 NOTE — Telephone Encounter (Signed)
I called pt to discuss his sleep study results. No answer, left a message asking him to call me back. 

## 2018-03-20 NOTE — Telephone Encounter (Signed)
-----   Message from [name], MD sent at 03/20/2018  8:39 AM EDT ----- Patient referred by Dr. Hyman Hopes, seen by me on 02/18/18, HST on 03/18/18 (for re-eval and new equipm).    Please call and notify the patient that the recent home sleep test showed obstructive sleep apnea in the moderate range. While I recommend treatment for this in the form CPAP, his insurance will not approve a sleep study for this. They will likely only approve a trial of autoPAP, which means, that we don't have to bring him in for a sleep study with CPAP, but will let him try an autoPAP machine at home, through a DME company (of his choice, or as per insurance requirement, he may have existing DME). The DME representative will educate him on how to use the machine, how to put the mask on, etc. I have placed an order in the chart. Please send referral, talk to patient, send report to referring MD. We will need a FU in sleep clinic for 10 weeks post-PAP set up, please arrange that with me or one of our NPs. Thanks,   [name], MD, PhD [facility])

## 2018-03-20 NOTE — Telephone Encounter (Signed)
I called pt's mobile number, line rang and then disconnected.

## 2018-03-21 NOTE — Telephone Encounter (Signed)
I called pt. I advised pt that Dr. Frances Furbish reviewed their sleep study results and found that pt has moderate osa. Dr. Frances Furbish recommends that pt start an auto pap at home, since pt's insurnace will deny an in-lab titration study. I reviewed PAP compliance expectations with the pt. Pt is agreeable to starting an auto-PAP. I advised pt that an order will be sent to a DME, [facility], and [facility] will call the pt within about one week after they file with the pt's insurance. [facility] will show the pt how to use the machine, fit for masks, and troubleshoot the auto-PAP if needed. A follow up appt was made for insurance purposes with Dr. Frances Furbish on 05/30/18 at 9:30am. Pt verbalized understanding to arrive 15 minutes early and bring their auto-PAP. A letter with all of this information in it will be sent to the pt's mychart account as a reminder. I verified with the pt that the address we have on file is correct. Pt verbalized understanding of results. Pt had no questions at this time but was encouraged to call back if questions arise.

## 2018-04-03 NOTE — Telephone Encounter (Signed)
[name] called sleep lab asking for a transfer of care to another DME in [place]. They are having hard time with [facility]. I will send auto pap orders to [facility].

## 2018-04-09 DIAGNOSIS — G4733 Obstructive sleep apnea (adult) (pediatric): Secondary | ICD-10-CM | POA: Diagnosis not present

## 2018-04-22 ENCOUNTER — Encounter: Payer: Self-pay | Admitting: Internal Medicine

## 2018-04-22 ENCOUNTER — Ambulatory Visit: Payer: BLUE CROSS/BLUE SHIELD | Admitting: Internal Medicine

## 2018-04-22 VITALS — BP 128/80 | HR 65 | Ht 66.0 in | Wt 206.4 lb

## 2018-04-22 DIAGNOSIS — I4819 Other persistent atrial fibrillation: Secondary | ICD-10-CM

## 2018-04-22 DIAGNOSIS — I481 Persistent atrial fibrillation: Secondary | ICD-10-CM

## 2018-04-22 DIAGNOSIS — I10 Essential (primary) hypertension: Secondary | ICD-10-CM

## 2018-04-22 NOTE — Progress Notes (Signed)
PCP: Shirlean Mylar, MD Primary Cardiologist: Dr Antoine Poche Primary EP: Dr Marney Doctor is a 52 y.o. male who presents today for routine electrophysiology followup.  Since last being seen in our clinic, the patient reports doing very well.  Today, he denies symptoms of palpitations, chest pain, shortness of breath,  lower extremity edema, dizziness, presyncope, or syncope.  The patient is otherwise without complaint today.   Past Medical History:  Diagnosis Date  . Anemia, iron deficiency 01/30/2017  . Cervicalgia   . CVA (cerebral infarction) 05/01/12   on pradaxa at time of stroke, switched to xarelto  . External thrombosed hemorrhoids   . GERD (gastroesophageal reflux disease)   . Guillain Barr syndrome [facility])    following seasonal influenza vaccination  . Guillain-Barre syndrome (HCC)    from stroke in 2013  . Hematuria    a. 01/2012 - to f/u with urology as outpt.  Marland Kitchen Hernia of unspecified site of abdominal cavity without mention of obstruction or gangrene    of abdominal  . Hx of gastric bypass 01/30/2017   2004 Dr Clent Ridges [facility] weight 450 lbs  . Hypertension   . Iron malabsorption 01/30/2017   Hx gastric bypass 2004  . Morbid obesity s/p gastric bypass   . Nonischemic cardiomyopathy (HCC)    a. 01/2012 Echo: EF 35-40%, Diff HK, mild MR;  b. 01/2012 R&L Heart Cath: mildly elevated R heart pressures (pcwp 17) , nl cors.  . Osteoarthrosis, unspecified whether generalized or localized, ankle and foot    of the knees,ankle and foot  . Pancytopenia   . Persistent atrial fibrillation (HCC)    a. Pradaxa started 01/2012;  b .s/p TEE/DCCV 02/16/2012; c. recurrent afib->Tikosyn Initiation 03/2012  . Sleep apnea    CPAP compliant  . Status post bypass gastrojejunostomy 01/30/2017  . Stroke (HCC)   . Unspecified intestinal malabsorption   . Urticaria, unspecified   . Vitamin B 12 deficiency    Past Surgical History:  Procedure Laterality Date  . ATRIAL FIBRILLATION  ABLATION N/A 06/25/2012   PVI by Dr [name]  . ATRIAL FIBRILLATION ABLATION N/A 10/04/2017   Procedure: ATRIAL FIBRILLATION ABLATION;  Surgeon: Hillis Range, MD;  Location: [facility] INVASIVE CV LAB;  Service: Cardiovascular;  Laterality: N/A;  . CARDIOVERSION  02/16/2012   Procedure: CARDIOVERSION;  Surgeon: [name], MD;  Location: [facility] ENDOSCOPY;  Service: Cardiovascular;  Laterality: N/A;  . CARDIOVERSION  04/22/2012   Procedure: CARDIOVERSION;  Surgeon: [name], MD;  Location[facility] ENDOSCOPY;  Service: Cardiovascular;  Laterality: N/A;  . CARDIOVERSION N/A 08/22/2017   Procedure: CARDIOVERSION;  Surgeon: Chilton Si, MD;  Location: [facility] ENDOSCOPY;  Service: Cardiovascular;  Laterality: N/A;  . COLONOSCOPY N/A 07/07/2016   Procedure: COLONOSCOPY;  Surgeon: Jeani Hawking, MD;  Location: [facility] ENDOSCOPY;  Service: Endoscopy;  Laterality: N/A;  . ESOPHAGOGASTRODUODENOSCOPY N/A 07/07/2016   Procedure: ESOPHAGOGASTRODUODENOSCOPY (EGD);  Surgeon: Jeani Hawking, MD;  Location: [facility] ENDOSCOPY;  Service: Endoscopy;  Laterality: N/A;  . GASTRIC BYPASS  2004  . HERNIA REPAIR    . LEFT AND RIGHT HEART CATHETERIZATION WITH CORONARY ANGIOGRAM N/A 02/13/2012   Procedure: LEFT AND RIGHT HEART CATHETERIZATION WITH CORONARY ANGIOGRAM;  Surgeon: Tonny Bollman, MD;  Location: [facility] CATH LAB;  Service: Cardiovascular;  Laterality: N/A;  . TEE WITHOUT CARDIOVERSION  02/16/2012   Procedure: TRANSESOPHAGEAL ECHOCARDIOGRAM (TEE);  Surgeon: [name], MD;  Location: [facility] ENDOSCOPY;  Service: Cardiovascular;  Laterality: N/A;  10a CV  . TEE  WITHOUT CARDIOVERSION  06/24/2012   Procedure: TRANSESOPHAGEAL ECHOCARDIOGRAM (TEE);  Surgeon: [name], MD;  Location: [facility] ENDOSCOPY;  Service: Cardiovascular;  Laterality: N/A;  . TEE WITHOUT CARDIOVERSION N/A 08/22/2017   Procedure: TRANSESOPHAGEAL ECHOCARDIOGRAM (TEE);  Surgeon: Chilton Si, MD;  Location: [facility] ENDOSCOPY;  Service: Cardiovascular;  Laterality: N/A;  .  UMBILICAL HERNIA REPAIR     x 2    ROS- all systems are reviewed and negatives except as per HPI above  Current Outpatient Medications  Medication Sig Dispense Refill  . amLODipine (NORVASC) 5 MG tablet TAKE ONE TABLET BY MOUTH ONCE DAILY (Patient taking differently: TAKE ONE TABLET BY MOUTH ONCE DAILY IN THE EVENING) 30 tablet 1  . azithromycin (ZITHROMAX) 250 MG tablet Take 1 tablet (250 mg total) by mouth daily. Take first 2 tablets together, then 1 every day until finished. 6 tablet 0  . ferrous sulfate 325 (65 FE) MG tablet Take 325 mg by mouth daily with breakfast.    . folic acid (FOLVITE) 1 MG tablet Take 1 tablet (1 mg total) by mouth daily. (Patient taking differently: Take 1 mg by mouth every evening. ) 100 tablet 3  . furosemide (LASIX) 40 MG tablet Take 20 mg by mouth daily.    Marland Kitchen ibuprofen (ADVIL,MOTRIN) 200 MG tablet Take 400 mg by mouth daily as needed for moderate pain.     Marland Kitchen losartan (COZAAR) 100 MG tablet Take 0.5 tablets (50 mg total) by mouth 2 (two) times daily. 90 tablet 3  . Methylcellulose, Laxative, (CITRUCEL) 500 MG TABS Take 500 mg by mouth daily.    . metoprolol tartrate (LOPRESSOR) 25 MG tablet Take 1 tablet (25 mg total) by mouth 2 (two) times daily. (Patient taking differently: Take 50 mg by mouth 2 (two) times daily. ) 180 tablet 1  . Multiple Vitamin (MULTIVITAMIN) tablet Take 1-2 tablets by mouth See admin instructions. Take 2 tablets in the morning and 1 tablet in the evening    . omeprazole (PRILOSEC) 40 MG capsule Take 40 mg by mouth daily.   1  . spironolactone (ALDACTONE) 50 MG tablet TAKE ONE TABLET BY MOUTH ONCE DAILY 30 tablet 1  . trolamine salicylate (ASPERCREME) 10 % cream Apply 1 application topically as needed for muscle pain.    . vitamin E (VITAMIN E) 400 UNIT capsule Take 400 Units by mouth daily.    Tyrone [name] 20 MG TABS tablet TAKE ONE TABLET BY MOUTH DAILY WITH  SUPPER (Patient taking differently: TAKE 20 MG BY MOUTH DAILY WITH  SUPPER) 30  tablet 1   No current facility-administered medications for this visit.    Facility-Administered Medications Ordered in Other Visits  Medication Dose Route Frequency Provider Last Rate Last Dose  . gadopentetate dimeglumine (MAGNEVIST) injection 20 mL  20 mL Intravenous Once PRN Anson Fret, MD        Physical Exam: Vitals:   04/22/18 1126  BP: 128/80  Pulse: 65  SpO2: 97%  Weight: 206 lb 6.4 oz (93.6 kg)  Height: 5\' 6"  (1.676 m)    GEN- The patient is well appearing, alert and oriented x 3 today.   Head- normocephalic, atraumatic Eyes-  Sclera clear, conjunctiva pink Ears- hearing intact Oropharynx- clear Lungs- Clear to ausculation bilaterally, normal work of breathing Heart- Regular rate and rhythm, no murmurs, rubs or gallops, PMI not laterally displaced GI- soft, NT, ND, + BS Extremities- no clubbing, cyanosis, or edema  Wt Readings from Last 3 Encounters:  04/22/18 206 lb 6.4  oz (93.6 kg)  02/19/18 204 lb 14.4 oz (92.9 kg)  02/18/18 203 lb (92.1 kg)    EKG tracing ordered today is personally reviewed and shows sinus rhythm 65 bpm, PR 142 msec, incomplete RBBb, nonspecific St/T changes  Assessment and Plan:  1. Persistent afib Well controlled post ablation chads2vasc score is 3 (including prior stroke).  Continue xarelto  2. HTN Stable No change required today  3. overweight Body mass index is 33.31 kg/m. Wt Readings from Last 3 Encounters:  04/22/18 206 lb 6.4 oz (93.6 kg)  02/19/18 204 lb 14.4 oz (92.9 kg)  02/18/18 203 lb (92.1 kg)  lifestyle modification encouraged  4. Nonischemic CM Resolved with sinus rhythm  Return in 3 months to AF clinic I will see in 6 months   Hillis Range MD, [facility] 04/22/2018 11:39 AM

## 2018-04-22 NOTE — Patient Instructions (Addendum)
Medication Instructions:  Your physician recommends that you continue on your current medications as directed. Please refer to the Current Medication list given to you today.  Labwork: None ordered.  Testing/Procedures: None ordered.  Follow-Up:  Your physician wants you to follow-up in: 3 months with [name] at the afib clinic.    Your physician wants you to follow-up in: 6 months with Dr. [name].      Any Other Special Instructions Will Be Listed Below (If Applicable).  If you need a refill on your cardiac medications before your next appointment, please call your pharmacy.

## 2018-04-30 ENCOUNTER — Other Ambulatory Visit: Payer: Self-pay | Admitting: Oncology

## 2018-04-30 DIAGNOSIS — K909 Intestinal malabsorption, unspecified: Secondary | ICD-10-CM

## 2018-04-30 DIAGNOSIS — Z980 Intestinal bypass and anastomosis status: Secondary | ICD-10-CM

## 2018-04-30 DIAGNOSIS — D508 Other iron deficiency anemias: Secondary | ICD-10-CM

## 2018-05-09 DIAGNOSIS — G4733 Obstructive sleep apnea (adult) (pediatric): Secondary | ICD-10-CM | POA: Diagnosis not present

## 2018-05-16 DIAGNOSIS — L718 Other rosacea: Secondary | ICD-10-CM | POA: Diagnosis not present

## 2018-05-16 DIAGNOSIS — Z85828 Personal history of other malignant neoplasm of skin: Secondary | ICD-10-CM | POA: Diagnosis not present

## 2018-05-27 ENCOUNTER — Encounter: Payer: Self-pay | Admitting: Neurology

## 2018-05-30 ENCOUNTER — Ambulatory Visit: Payer: BLUE CROSS/BLUE SHIELD | Admitting: Neurology

## 2018-05-30 ENCOUNTER — Encounter: Payer: Self-pay | Admitting: Neurology

## 2018-05-30 VITALS — BP 147/92 | HR 51 | Ht 66.0 in | Wt 205.0 lb

## 2018-05-30 DIAGNOSIS — Z9989 Dependence on other enabling machines and devices: Secondary | ICD-10-CM | POA: Diagnosis not present

## 2018-05-30 DIAGNOSIS — G4733 Obstructive sleep apnea (adult) (pediatric): Secondary | ICD-10-CM | POA: Diagnosis not present

## 2018-05-30 NOTE — Progress Notes (Signed)
Subjective:    Patient ID: Tyrone [name] is a 52 y.o. male.  HPI     Interim history:   Tyrone [name] is a 52 year old right-handed gentleman with an underlying complex medical history of A. fib, cardiomyopathy, hypertension, obesity, status post gastric bypass in 2004 with over 200 pound weight loss, reflux disease, vitamin D deficiency, history of stroke, history of Guillain-Barr syndrome, arthritis, anemia, vitamin B12 deficiency, who presents for follow-up consultation of his obstructive sleep apnea after recent home sleep testing and started AutoPap therapy. The patient is unaccompanied today. Her first met him on 02/18/2018 at the request of his primary care physician, at which time he reported a prior diagnosis of obstructive sleep apnea and CPAP therapy. He needed reevaluation. I suggested we proceed with a sleep study. His insurance denied any laboratory attended sleep study. He had a sleep test on 03/18/2018 which indicated moderate obstructive sleep apnea with an AHI of 26.1 per hour, average oxygen saturation of 94%, nadir of 86%. He was advised to proceed with AutoPap therapy.  Today, 05/30/2018: I reviewed his AutoPap compliance data from 04/28/2018 through 05/27/2018 which is a total of 30 days, during which time he used his AutoPap 28 days with percent used days greater than 4 hours at 47% only, indicating suboptimal compliance with an average usage of 3 hours and 52 minutes only, residual AHI at goal at 0.6 per hour, 95th percentile of pressure at 8.5 cm, leak acceptable with the 95th percentile at 13.8 L/m on a pressure range of 6 cm to 13 cm with EPR. In the past 49 days he has had a similar compliance from one of 4 hours with an average usage of 4 hours of 9 minutes and percent used days greater than 4 hours at 45% only. He reports doing okay, feels okay but no telltale change, pressure is less than old machine was. He is not always sleeping very well, has had increase in stress, but  motivated to continue and intends to increase usage.    The patient's allergies, current medications, family history, past medical history, past social history, past surgical history and problem list were reviewed and updated as appropriate.   Previously:  02/18/2018: (He) was previously diagnosed with obstructive sleep apnea and placed on CPAP therapy. Sleep study testing was several years ago, prior sleep study results are not available for my review today. A CPAP compliance download is not possible from his machine as it is older. I reviewed your office note from 12/04/2017, which you kindly included. For his A. fib, he had cardioversion in the past and is status post ablation in 2013 and most recently in March 2019. His bedtime is generally around 11 or 11:30 but typically, he has already had a nap on the sofa before he goes to bed. Rise time is generally between 5:30 and 6. He has nocturia about once per average night, denies restless leg symptoms or morning headaches. He has no family history of OSA. He had a tonsillectomy as a child. His original sleep apnea diagnosis as far as he recalls was severe. He then had gastric bypass and lost a lot of weight, had another sleep study maybe 7 or 8 years ago at which time his pressure was reduced. He believes that his original treatment pressure was around 15 cm and his current pressure is around 7 or 8 cm. He uses a nasal mask successfully. His Epworth sleepiness score is 7 out of 24, fatigue score is 30 out  of 65. He is married and lives with his wife, they have 1 child, he quit chewing tobacco in 2004. A consult call occasionally, caffeine daily, 2-3 servings per day on average.  His Past Medical History Is Significant For: Past Medical History:  Diagnosis Date  . Anemia, iron deficiency 01/30/2017  . Cervicalgia   . CVA (cerebral infarction) 05/01/12   on pradaxa at time of stroke, switched to [place]  . External thrombosed hemorrhoids   . GERD  (gastroesophageal reflux disease)   . Guillain Barr syndrome [facility])    following seasonal influenza vaccination  . Guillain-Barre syndrome ([place])    from stroke in 2013  . Hematuria    a. 01/2012 - to f/u with urology as outpt.  Marland Kitchen Hernia of unspecified site of abdominal cavity without mention of obstruction or gangrene    of abdominal  . Hx of gastric bypass 01/30/2017   2004 Dr Volanda Napoleon [facility] weight 450 lbs  . Hypertension   . Iron malabsorption 01/30/2017   Hx gastric bypass 2004  . Morbid obesity s/p gastric bypass   . Nonischemic cardiomyopathy ([place])    a. 01/2012 Echo: EF 35-40%, Diff HK, mild MR;  b. 01/2012 R&L Heart Cath: mildly elevated R heart pressures (pcwp 17) , nl cors.  . Osteoarthrosis, unspecified whether generalized or localized, ankle and foot    of the knees,ankle and foot  . Pancytopenia   . Persistent atrial fibrillation    a. Pradaxa started 01/2012;  b .s/p TEE/DCCV 02/16/2012; c. recurrent afib->Tikosyn Initiation 03/2012  . Sleep apnea    CPAP compliant  . Status post bypass gastrojejunostomy 01/30/2017  . Stroke ([place])   . Unspecified intestinal malabsorption   . Urticaria, unspecified   . Vitamin B 12 deficiency     His Past Surgical History Is Significant For: Past Surgical History:  Procedure Laterality Date  . ATRIAL FIBRILLATION ABLATION N/A 06/25/2012   PVI by Dr Rayann Heman  . ATRIAL FIBRILLATION ABLATION N/A 10/04/2017   Procedure: ATRIAL FIBRILLATION ABLATION;  Surgeon: Thompson Grayer, MD;  Location: [place] CV LAB;  Service: Cardiovascular;  Laterality: N/A;  . CARDIOVERSION  02/16/2012   Procedure: CARDIOVERSION;  Surgeon: Lelon Perla, MD;  Location: [facility] ENDOSCOPY;  Service: Cardiovascular;  Laterality: N/A;  . CARDIOVERSION  04/22/2012   Procedure: CARDIOVERSION;  Surgeon: Deboraha Sprang, MD;  Location: [facility] ENDOSCOPY;  Service: Cardiovascular;  Laterality: N/A;  . CARDIOVERSION N/A 08/22/2017   Procedure: CARDIOVERSION;  Surgeon: Skeet Latch, MD;  Location: [facility] ENDOSCOPY;  Service: Cardiovascular;  Laterality: N/A;  . COLONOSCOPY N/A 07/07/2016   Procedure: COLONOSCOPY;  Surgeon: Carol Ada, MD;  Location: [facility] ENDOSCOPY;  Service: Endoscopy;  Laterality: N/A;  . ESOPHAGOGASTRODUODENOSCOPY N/A 07/07/2016   Procedure: ESOPHAGOGASTRODUODENOSCOPY (EGD);  Surgeon: Carol Ada, MD;  Location: [facility] ENDOSCOPY;  Service: Endoscopy;  Laterality: N/A;  . GASTRIC BYPASS  2004  . HERNIA REPAIR    . LEFT AND RIGHT HEART CATHETERIZATION WITH CORONARY ANGIOGRAM N/A 02/13/2012   Procedure: LEFT AND RIGHT HEART CATHETERIZATION WITH CORONARY ANGIOGRAM;  Surgeon: Sherren Mocha, MD;  Location: [facility] CATH LAB;  Service: Cardiovascular;  Laterality: N/A;  . TEE WITHOUT CARDIOVERSION  02/16/2012   Procedure: TRANSESOPHAGEAL ECHOCARDIOGRAM (TEE);  Surgeon: Lelon Perla, MD;  Location: [facility] ENDOSCOPY;  Service: Cardiovascular;  Laterality: N/A;  10a CV  . TEE WITHOUT CARDIOVERSION  06/24/2012   Procedure: TRANSESOPHAGEAL ECHOCARDIOGRAM (TEE);  Surgeon: Peter M Martinique, MD;  Location: [place];  Service: Cardiovascular;  Laterality:  N/A;  . TEE WITHOUT CARDIOVERSION N/A 08/22/2017   Procedure: TRANSESOPHAGEAL ECHOCARDIOGRAM (TEE);  Surgeon: Skeet Latch, MD;  Location: [facility] ENDOSCOPY;  Service: Cardiovascular;  Laterality: N/A;  . UMBILICAL HERNIA REPAIR     x 2    His Family History Is Significant For: Family History  Problem Relation Age of Onset  . Hypertension Mother   . Hypertension Father   . Heart attack Father 9       Died  . Diabetes Father   . Kidney disease Father        with transplant  . Stroke Paternal Grandmother     His Social History Is Significant For: Social History   Socioeconomic History  . Marital status: Married    Spouse name: Not on file  . Number of children: 1  . Years of education: Not on file  . Highest education level: Not on file  Occupational History  . Occupation: Cabin crew  Social Needs  .  Financial resource strain: Not on file  . Food insecurity:    Worry: Not on file    Inability: Not on file  . Transportation needs:    Medical: Not on file    Non-medical: Not on file  Tobacco Use  . Smoking status: Never Smoker  . Smokeless tobacco: Former Systems developer    Types: Chew  Substance and Sexual Activity  . Alcohol use: Yes    Comment: occasional  . Drug use: No  . Sexual activity: Yes    Partners: Female  Lifestyle  . Physical activity:    Days per week: Not on file    Minutes per session: Not on file  . Stress: Not on file  Relationships  . Social connections:    Talks on phone: Not on file    Gets together: Not on file    Attends religious service: Not on file    Active member of club or organization: Not on file    Attends meetings of clubs or organizations: Not on file    Relationship status: Not on file  Other Topics Concern  . Not on file  Social History Narrative   Lives with wife.  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 amLODipine (NORVASC) 5 MG tablet TAKE ONE TABLET BY MOUTH ONCE DAILY (Patient taking differently: TAKE ONE TABLET BY MOUTH ONCE DAILY IN THE EVENING)  . azithromycin (ZITHROMAX) 250 MG tablet Take 1 tablet (250 mg total) by mouth daily. Take first 2 tablets together, then 1 every day until finished.  . ferrous sulfate 325 (65 FE) MG tablet Take 325 mg by mouth daily with breakfast.  . folic acid (FOLVITE) 1 MG tablet Take 1 tablet (1 mg total) by mouth daily. (Patient taking differently: Take 1 mg by mouth every evening. )  . furosemide (LASIX) 40 MG tablet Take 20 mg by mouth daily.  Marland Kitchen ibuprofen (ADVIL,MOTRIN) 200 MG tablet Take 400 mg by mouth daily as needed for moderate pain.   Marland Kitchen losartan (COZAAR) 100 MG tablet Take 0.5 tablets (50 mg total) by mouth 2 (two) times daily.  .  Methylcellulose, Laxative, (CITRUCEL) 500 MG TABS Take 500 mg by mouth daily.  . metoprolol tartrate (LOPRESSOR) 25 MG tablet Take 1 tablet (25 mg total) by mouth 2 (two) times daily. (Patient taking differently: Take 50  mg by mouth 2 (two) times daily. )  . Multiple Vitamin (MULTIVITAMIN) tablet Take 1-2 tablets by mouth See admin instructions. Take 2 tablets in the morning and 1 tablet in the evening  . omeprazole (PRILOSEC) 40 MG capsule Take 40 mg by mouth daily.   Marland Kitchen spironolactone (ALDACTONE) 50 MG tablet TAKE ONE TABLET BY MOUTH ONCE DAILY  . trolamine salicylate (ASPERCREME) 10 % cream Apply 1 application topically as needed for muscle pain.  . vitamin E (VITAMIN E) 400 UNIT capsule Take 400 Units by mouth daily.  Alveda Reasons 20 MG TABS tablet TAKE ONE TABLET BY MOUTH DAILY WITH  SUPPER (Patient taking differently: TAKE 20 MG BY MOUTH DAILY WITH  SUPPER)   Facility-Administered Encounter Medications as of 05/30/2018  Medication  . gadopentetate dimeglumine (MAGNEVIST) injection 20 mL  :  Review of Systems:  Out of a complete 14 point review of systems, all are reviewed and negative with the exception of these symptoms as listed below:  Review of Systems  Neurological:       Pt presents today for follow up on his cpap  Pt reports that his cpap is going well.    Objective:  Neurological Exam  Physical Exam Physical Examination:   Vitals:   05/30/18 0929  BP: (!) 147/92  Pulse: (!) 51    General Examination: The patient is a very pleasant 52 y.o. male in no acute distress. He appears well-developed and well-nourished and well groomed.   HEENT: Normocephalic, atraumatic, pupils are equal, round and reactive to light and accommodation. Corrective eyeglasses in place. Extraocular tracking is good without limitation to gaze excursion or nystagmus noted. Normal smooth pursuit is noted. Hearing is grossly intact. Face is symmetric with normal facial animation and normal facial  sensation. Speech is clear with no dysarthria noted. There is no hypophonia. There is no lip, neck/head, jaw or voice tremor. Neck shows FROM. Oropharynx exam reveals: mild mouth dryness, adequate dental hygiene and mild airway crowding. Tongue protrudes centrally and palate elevates symmetrically.   Chest: Clear to auscultation without wheezing, rhonchi or crackles noted.  Heart: S1+S2+0, regular and normal without murmurs, rubs or gallops noted.   Abdomen: Soft, non-tender and non-distended with normal bowel sounds appreciated on auscultation.  Extremities: There is trace pitting edema in the distal lower extremities bilaterally.   Skin: Warm and dry without trophic changes noted.  Musculoskeletal: exam reveals no obvious joint deformities, tenderness or joint swelling or erythema.   Neurologically:  Mental status: The patient is awake, alert and oriented in all 4 spheres. His immediate and remote memory, attention, language skills and fund of knowledge are appropriate. There is no evidence of aphasia, agnosia, apraxia or anomia. Speech is clear with normal prosody and enunciation. Thought process is linear. Mood is normal and affect is normal.  Cranial nerves II - XII are as described above under HEENT exam. In addition: shoulder shrug is normal with equal shoulder height noted. Motor exam: Normal bulk, strength and tone is noted. There is no tremor. Romberg is negative, except minimal sway. Fine motor skills and coordination: grossly intact.  Cerebellar testing: No dysmetria or intention tremor. There is no truncal or gait ataxia.  Sensory exam: intact to light touch.  Gait, station and balance: He stands easily. No veering to one side is noted. No leaning to one side is noted. Posture is age-appropriate and stance is narrow based. Gait shows normal stride length and normal pace. No problems turning are noted. Tandem walk  is slightly challenging.   Assessment and Plan:  In summary,  Tyrone [name] is a very pleasant 52 year old male with an underlying complex medical history of A. fib, cardiomyopathy, hypertension, obesity, status post gastric bypass in 2004 with over 200 pound weight loss, reflux disease, vitamin D deficiency, history of stroke, history of Guillain-Barr syndrome, arthritis, anemia, vitamin B12 deficiency, who presents for Follow-up consultation of his obstructive sleep apnea which was determined to be in the moderate range by home sleep testing in August 2019. He required reevaluation because of his older equipment and he also had weight fluctuations. He has been on AutoPap therapy. He is quite consistent lately with putting it on but does not keep it on a very long some nights, reports difficulty maintaining sleep, increase in stress lately. He is motivated to continue with treatment and intends to increase his usage. I suggested that we review another download remotely in about 6 weeks, he is advised to call or email [place] for this. In addition, I suggested a follow-up with one of our nurse practitioners in about 3 months for another recheck. I noticed a slight intermittent systolic murmur today. He is advised to discuss this with his cardiologist. He has had no new neurological symptoms and no cardiac symptoms. I answered all his questions today and he was in agreement with the plan. I spent 25 minutes in total face-to-face time with the patient, more than 50% of which was spent in counseling and coordination of care, reviewing test results, reviewing medication and discussing or reviewing the diagnosis of OSA, its prognosis and treatment options. Pertinent laboratory and imaging test results that were available during this visit with the patient were reviewed by me and considered in my medical decision making (see chart for details).

## 2018-05-30 NOTE — Patient Instructions (Addendum)
Please continue using your CPAP regularly. While your insurance requires that you use CPAP at least 4 hours each night on 70% of the nights, I recommend, that you not skip any nights and use it throughout the night if you can. Getting used to CPAP and staying with the treatment long term does take time and patience and discipline. Untreated obstructive sleep apnea when it is moderate to severe can have an adverse impact on cardiovascular health and raise her risk for heart disease, arrhythmias, hypertension, congestive heart failure, stroke and diabetes. Untreated obstructive sleep apnea causes sleep disruption, nonrestorative sleep, and sleep deprivation. This can have an impact on your day to day functioning and cause daytime sleepiness and impairment of cognitive function, memory loss, mood disturbance, and problems focussing. Using CPAP regularly can improve these symptoms.  We can pull another download in about 6 weeks for monitor improvement in your compliance, as you are not quite there yet. I can see where you are trying. Please call or email through My Chart.  Please follow up in 3 months with Darrol Angel, NP.

## 2018-06-08 ENCOUNTER — Telehealth: Payer: Self-pay | Admitting: Cardiology

## 2018-06-08 NOTE — Telephone Encounter (Signed)
  Patient profile: 52 year old male with history of persistent atrial fibrillation status post A. fib ablation, on chronic anticoagulation with Xarelto and followed by Dr. [name].  Outpatient phone call: Patient called stating that his apple watch detected that he was back in atrial fibrillation.  Ventricular rates are elevated in the low 100s.  He feels palpitations but denies chest pain, dyspnea, dizziness, syncope/near syncope.  He reports full compliance with Xarelto.  Fully compliant with metoprolol.  He is on 25 mg twice daily and took his morning dose earlier today.  He denies any triggers.  No stimulants including no excess caffeine or alcohol.  No recent vomiting or diarrhea.  I asked patient to check his blood pressure during our conversation.  Blood pressure was elevated at 130/100.  He was instructed to go ahead and take a another dose of metoprolol 25 mg now.  Patient was advised to rest the rest of the day and to avoid any stimulants that could increase his heart rate.  He was instructed to take his evening dose of metoprolol later tonight.  If his heart rate is above 100, I recommended that he take 50 mg of metoprolol tonight.  He will call back if his condition worsens. Otherwise, I feel that this can be followed in the outpatient setting.  I will route notification to Dr. [name] as well as [name], nurse practitioner, in the atrial fibrillation clinic for them to follow-up with the patient on Monday.  He understands to remain fully compliant with Xarelto.  Robbie Lis, PA-C 06/08/2018

## 2018-06-09 DIAGNOSIS — G4733 Obstructive sleep apnea (adult) (pediatric): Secondary | ICD-10-CM | POA: Diagnosis not present

## 2018-06-10 ENCOUNTER — Telehealth: Payer: Self-pay | Admitting: Internal Medicine

## 2018-06-10 ENCOUNTER — Other Ambulatory Visit (HOSPITAL_COMMUNITY): Payer: Self-pay | Admitting: *Deleted

## 2018-06-10 ENCOUNTER — Ambulatory Visit (HOSPITAL_COMMUNITY)
Admission: RE | Admit: 2018-06-10 | Discharge: 2018-06-10 | Disposition: A | Discharge status: Home / Self Care | Payer: BLUE CROSS/BLUE SHIELD | Source: Ambulatory Visit | Attending: Nurse Practitioner | Admitting: Nurse Practitioner

## 2018-06-10 ENCOUNTER — Other Ambulatory Visit: Payer: Self-pay

## 2018-06-10 ENCOUNTER — Encounter (HOSPITAL_COMMUNITY): Payer: Self-pay | Admitting: Nurse Practitioner

## 2018-06-10 VITALS — BP 114/78 | HR 107 | Ht 66.0 in | Wt 204.8 lb

## 2018-06-10 DIAGNOSIS — Z8673 Personal history of transient ischemic attack (TIA), and cerebral infarction without residual deficits: Secondary | ICD-10-CM | POA: Insufficient documentation

## 2018-06-10 DIAGNOSIS — E538 Deficiency of other specified B group vitamins: Secondary | ICD-10-CM | POA: Diagnosis not present

## 2018-06-10 DIAGNOSIS — D509 Iron deficiency anemia, unspecified: Secondary | ICD-10-CM | POA: Diagnosis not present

## 2018-06-10 DIAGNOSIS — I4819 Other persistent atrial fibrillation: Secondary | ICD-10-CM | POA: Diagnosis not present

## 2018-06-10 DIAGNOSIS — I4891 Unspecified atrial fibrillation: Secondary | ICD-10-CM | POA: Diagnosis present

## 2018-06-10 DIAGNOSIS — Z79899 Other long term (current) drug therapy: Secondary | ICD-10-CM | POA: Insufficient documentation

## 2018-06-10 DIAGNOSIS — M1990 Unspecified osteoarthritis, unspecified site: Secondary | ICD-10-CM | POA: Insufficient documentation

## 2018-06-10 DIAGNOSIS — Z833 Family history of diabetes mellitus: Secondary | ICD-10-CM | POA: Diagnosis not present

## 2018-06-10 DIAGNOSIS — G4730 Sleep apnea, unspecified: Secondary | ICD-10-CM | POA: Insufficient documentation

## 2018-06-10 DIAGNOSIS — I10 Essential (primary) hypertension: Secondary | ICD-10-CM | POA: Diagnosis not present

## 2018-06-10 DIAGNOSIS — Z87891 Personal history of nicotine dependence: Secondary | ICD-10-CM | POA: Diagnosis not present

## 2018-06-10 DIAGNOSIS — K219 Gastro-esophageal reflux disease without esophagitis: Secondary | ICD-10-CM | POA: Diagnosis not present

## 2018-06-10 DIAGNOSIS — Z7901 Long term (current) use of anticoagulants: Secondary | ICD-10-CM | POA: Diagnosis not present

## 2018-06-10 DIAGNOSIS — Z841 Family history of disorders of kidney and ureter: Secondary | ICD-10-CM | POA: Diagnosis not present

## 2018-06-10 DIAGNOSIS — Z8249 Family history of ischemic heart disease and other diseases of the circulatory system: Secondary | ICD-10-CM | POA: Insufficient documentation

## 2018-06-10 DIAGNOSIS — K909 Intestinal malabsorption, unspecified: Secondary | ICD-10-CM | POA: Insufficient documentation

## 2018-06-10 DIAGNOSIS — Z9884 Bariatric surgery status: Secondary | ICD-10-CM | POA: Diagnosis not present

## 2018-06-10 LAB — CBC
HCT: 39.2 % (ref 39.0–52.0)
Hemoglobin: 12.7 g/dL — ABNORMAL LOW (ref 13.0–17.0)
MCH: 32.3 pg (ref 26.0–34.0)
MCHC: 32.4 g/dL (ref 30.0–36.0)
MCV: 99.7 fL (ref 80.0–100.0)
Platelets: 137 10*3/uL — ABNORMAL LOW (ref 150–400)
RBC: 3.93 MIL/uL — ABNORMAL LOW (ref 4.22–5.81)
RDW: 13.5 % (ref 11.5–15.5)
WBC: 3.7 10*3/uL — ABNORMAL LOW (ref 4.0–10.5)
nRBC: 0 % (ref 0.0–0.2)

## 2018-06-10 LAB — BASIC METABOLIC PANEL
Anion gap: 6 (ref 5–15)
BUN: 11 mg/dL (ref 6–20)
CO2: 26 mmol/L (ref 22–32)
Calcium: 8.5 mg/dL — ABNORMAL LOW (ref 8.9–10.3)
Chloride: 105 mmol/L (ref 98–111)
Creatinine, Ser: 0.88 mg/dL (ref 0.61–1.24)
GFR calc Af Amer: >60 mL/min (ref >60)
GFR calc non Af Amer: >60 mL/min (ref >60)
Glucose, Bld: 126 mg/dL — ABNORMAL HIGH (ref 70–99)
Potassium: 4 mmol/L (ref 3.5–5.1)
Sodium: 137 mmol/L (ref 135–145)

## 2018-06-10 NOTE — Telephone Encounter (Signed)
Patient continues in AF with increased dose of metoprolol since Saturday. Patient states his heart rates were more controlled yesterday than they have been today. Appt made for today at 1:30pm. Pt is supposed to fly to [place] on Thursday.

## 2018-06-10 NOTE — H&P (View-Only) (Signed)
Primary Care Physician: Tyrone Mylar, MD Referring Physician: Dr. Marney [name] is a 52 y.o. male with a h/o afib, s/p ablation x 2 , last one 6 months ago. He started in afib with RVR on Saturday while quietly sitting at his desk.He was alerted by his Apple phone. He was told to double his BB dose. He remains in afib at 107 bpm .He leaves to go to [employer] on Thursday  and cannot be sure if he missed an DOAC dose in the last 3 weeks.  Today, he denies symptoms of palpitations, chest pain, shortness of breath, orthopnea, PND, lower extremity edema, dizziness, presyncope, syncope, or neurologic sequela. The patient is tolerating medications without difficulties and is otherwise without complaint today.   Past Medical History:  Diagnosis Date  . Anemia, iron deficiency 01/30/2017  . Cervicalgia   . CVA (cerebral infarction) 05/01/12   on pradaxa at time of stroke, switched to xarelto  . External thrombosed hemorrhoids   . GERD (gastroesophageal reflux disease)   . Guillain Barr syndrome [facility])    following seasonal influenza vaccination  . Guillain-Barre syndrome (HCC)    from stroke in 2013  . Hematuria    a. 01/2012 - to f/u with urology as outpt.  Marland Kitchen Hernia of unspecified site of abdominal cavity without mention of obstruction or gangrene    of abdominal  . Hx of gastric bypass 01/30/2017   2004 Dr Tyrone [name] [facility] weight 450 lbs  . Hypertension   . Iron malabsorption 01/30/2017   Hx gastric bypass 2004  . Morbid obesity s/p gastric bypass   . Nonischemic cardiomyopathy (HCC)    a. 01/2012 Echo: EF 35-40%, Diff HK, mild MR;  b. 01/2012 R&L Heart Cath: mildly elevated R heart pressures (pcwp 17) , nl cors.  . Osteoarthrosis, unspecified whether generalized or localized, ankle and foot    of the knees,ankle and foot  . Pancytopenia   . Persistent atrial fibrillation    a. Pradaxa started 01/2012;  b .s/p TEE/DCCV 02/16/2012; c. recurrent afib->Tikosyn Initiation 03/2012   . Sleep apnea    CPAP compliant  . Status post bypass gastrojejunostomy 01/30/2017  . Stroke (HCC)   . Unspecified intestinal malabsorption   . Urticaria, unspecified   . Vitamin B 12 deficiency    Past Surgical History:  Procedure Laterality Date  . ATRIAL FIBRILLATION ABLATION N/A 06/25/2012   PVI by Dr Tyrone [name]  . ATRIAL FIBRILLATION ABLATION N/A 10/04/2017   Procedure: ATRIAL FIBRILLATION ABLATION;  Surgeon: Tyrone Range, MD;  [facility] INV[facility] LAB;  Service: Cardiovascular;  Laterality: N/A;  . CARDIOVERSION  02/16/2012   Procedure: CARDIOVERSION;  Surgeon: Tyrone Bunting, MD;  Location: [facility] ENDOSCOPY;  Service: Cardiovascular;  Laterality: N/A;  . CARDIOVERSION  04/22/2012   Procedure: CARDIOVERSION;  Surgeon: Tyrone Salvia, MD;  Location: [facility] ENDOSCOPY;  Service: Cardiovascular;  Laterality: N/A;  . CARDIOVERSION N/A 08/22/2017   Procedure: CARDIOVERSION;  Surgeon: Tyrone Si, MD;  Location: [facility] ENDOSCOPY;  Service: Cardiovascular;  Laterality: N/A;  . COLONOSCOPY N/A 07/07/2016   Procedure: COLONOSCOPY;  Surgeon: Tyrone Hawking, MD;  Location: [facility] ENDOSCOPY;  Service: Endoscopy;  Laterality: N/A;  . ESOPHAGOGASTRODUODENOSCOPY N/A 07/07/2016   Procedure: ESOPHAGOGASTRODUODENOSCOPY (EGD);  Surgeon: Tyrone Hawking, MD;  Location: [facility] ENDOSCOPY;  Service: Endoscopy;  Laterality: N/A;  . GASTRIC BYPASS  2004  . HERNIA REPAIR    . LEFT AND RIGHT HEART CATHETERIZATION WITH CORONARY ANGIOGRAM N/A 02/13/2012   Procedure:  LEFT AND RIGHT HEART CATHETERIZATION WITH CORONARY ANGIOGRAM;  Surgeon: Tyrone Bollman, MD;  Location: [facility] CATH LAB;  Service: Cardiovascular;  Laterality: N/A;  . TEE WITHOUT CARDIOVERSION  02/16/2012   Procedure: TRANSESOPHAGEAL ECHOCARDIOGRAM (TEE);  Surgeon: Tyrone Bunting, MD;  Location: [facility] ENDOSCOPY;  Service: Cardiovascular;  Laterality: N/A;  10a CV  . TEE WITHOUT CARDIOVERSION  06/24/2012   Procedure: TRANSESOPHAGEAL ECHOCARDIOGRAM (TEE);  Surgeon: Tyrone M  Swaziland, MD;  Location: [facility] ENDOSCOPY;  Service: Cardiovascular;  Laterality: N/A;  . TEE WITHOUT CARDIOVERSION N/A 08/22/2017   Procedure: TRANSESOPHAGEAL ECHOCARDIOGRAM (TEE);  Surgeon: Tyrone Si, MD;  Location: [facility] ENDOSCOPY;  Service: Cardiovascular;  Laterality: N/A;  . UMBILICAL HERNIA REPAIR     x 2    Current Outpatient Medications  Medication Sig Dispense Refill  . amLODipine (NORVASC) 5 MG tablet TAKE ONE TABLET BY MOUTH ONCE DAILY (Patient taking differently: TAKE ONE TABLET BY MOUTH ONCE DAILY IN THE EVENING) 30 tablet 1  . ferrous sulfate 325 (65 FE) MG tablet Take 325 mg by mouth daily with breakfast.    . folic acid (FOLVITE) 1 MG tablet Take 1 tablet (1 mg total) by mouth daily. (Patient taking differently: Take 1 mg by mouth every evening. ) 100 tablet 3  . furosemide (LASIX) 40 MG tablet Take 20 mg by mouth daily.    Marland Kitchen ibuprofen (ADVIL,MOTRIN) 200 MG tablet Take 400 mg by mouth daily as needed for moderate pain.     Marland Kitchen losartan (COZAAR) 100 MG tablet Take 0.5 tablets (50 mg total) by mouth 2 (two) times daily. 90 tablet 3  . Methylcellulose, Laxative, (CITRUCEL) 500 MG TABS Take 500 mg by mouth daily.    . metoprolol tartrate (LOPRESSOR) 25 MG tablet Take 1 tablet (25 mg total) by mouth 2 (two) times daily. (Patient taking differently: Take 50 mg by mouth 2 (two) times daily. ) 180 tablet 1  . Multiple Vitamin (MULTIVITAMIN) tablet Take 1-2 tablets by mouth See admin instructions. Take 2 tablets in the morning and 1 tablet in the evening    . omeprazole (PRILOSEC) 40 MG capsule Take 40 mg by mouth daily.   1  . spironolactone (ALDACTONE) 50 MG tablet TAKE ONE TABLET BY MOUTH ONCE DAILY 30 tablet 1  . trolamine salicylate (ASPERCREME) 10 % cream Apply 1 application topically as needed for muscle pain.    . vitamin E (VITAMIN E) 400 UNIT capsule Take 400 Units by mouth daily.    Tyrone [name] Hurl 20 MG TABS tablet TAKE ONE TABLET BY MOUTH DAILY WITH  SUPPER (Patient taking  differently: TAKE 20 MG BY MOUTH DAILY WITH  SUPPER) 30 tablet 1   No current facility-administered medications for this encounter.    Facility-Administered Medications Ordered in Other Encounters  Medication Dose Route Frequency Provider Last Rate Last Dose  . gadopentetate dimeglumine (MAGNEVIST) injection 20 mL  20 mL Intravenous Once PRN Anson Fret, MD        No Known Allergies  Social History   Socioeconomic History  . Marital status: Married    Spouse name: Not on file  . Number of children: 1  . Years of education: Not on file  . Highest education level: Not on file  Occupational History  . Occupation: Veterinary surgeon  Social Needs  . Financial resource strain: Not on file  . Food insecurity:    Worry: Not on file    Inability: Not on file  . Transportation needs:    Medical: Not on file  Non-medical: Not on file  Tobacco Use  . Smoking status: Never Smoker  . Smokeless tobacco: Former Neurosurgeon    Types: Chew  Substance and Sexual Activity  . Alcohol use: Yes    Comment: occasional  . Drug use: No  . Sexual activity: Yes    Partners: Female  Lifestyle  . Physical activity:    Days per week: Not on file    Minutes per session: Not on file  . Stress: Not on file  Relationships  . Social connections:    Talks on phone: Not on file    Gets together: Not on file    Attends religious service: Not on file    Active member of club or organization: Not on file    Attends meetings of clubs or organizations: Not on file    Relationship status: Not on file  . Intimate partner violence:    Fear of current or ex partner: Not on file    Emotionally abused: Not on file    Physically abused: Not on file    Forced sexual activity: Not on file  Other Topics Concern  . Not on file  Social History Narrative   Lives with wife.  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 Hypertension Mother   .  Hypertension Father   . Heart attack Father 38       Died  . Diabetes Father   . Kidney disease Father        with transplant  . Stroke Paternal Grandmother     ROS- All systems are reviewed and negative except as per the HPI above  Physical Exam: Vitals:   06/10/18 1336  BP: 114/78  Pulse: (!) 107  Weight: 92.9 kg  Height: 5\' 6"  (1.676 m)   Wt Readings from Last 3 Encounters:  06/10/18 92.9 kg  05/30/18 93 kg  04/22/18 93.6 kg    Labs: Lab Results  Component Value Date   NA 142 09/27/2017   K 4.4 09/27/2017   CL 104 09/27/2017   CO2 26 09/27/2017   GLUCOSE 99 09/27/2017   BUN 11 09/27/2017   CREATININE 0.76 09/27/2017   CALCIUM 8.7 09/27/2017   MG 1.8 08/14/2017   Lab Results  Component Value Date   INR 1.29 05/01/2012   Lab Results  Component Value Date   CHOL 165 06/15/2015   HDL 85 06/15/2015   LDLCALC 71 06/15/2015   TRIG 46 06/15/2015     GEN- The patient is well appearing, alert and oriented x 3 today.   Head- normocephalic, atraumatic Eyes-  Sclera clear, conjunctiva pink Ears- hearing intact Oropharynx- clear Neck- supple, no JVP Lymph- no cervical lymphadenopathy Lungs- Clear to ausculation bilaterally, normal work of breathing Heart- irregular rate and rhythm, no murmurs, rubs or gallops, PMI not laterally displaced GI- soft, NT, ND, + BS Extremities- no clubbing, cyanosis, or edema MS- no significant deformity or atrophy Skin- no rash or lesion Psych- euthymic mood, full affect Neuro- strength and sensation are intact  EKG-Afib at 107 bpm, qrs int 102 ms, qtc 469 ms    Assessment and Plan: 1. Persistent afib Will schedule for TEE guided cardioversion( missed a dose of anticoagulation in the last 3-4 weeks) He will lower the dose of BB back to 25 mg metoprolol tartrate  bid starting in am as he has bradycardia in SR Risk vrs benefit of procedure  explained  and pt wants too proceed CBC/bmet today Continue xarelto 20 mg  daily  F/u here 1/6, sooner if needed  Lupita Leash C. Matthew Folks [facility] [address] [place], [place] [address] [phone]

## 2018-06-10 NOTE — Progress Notes (Signed)
 Primary Care Physician: Webb, Carol, MD Referring Physician: Dr. Allred   Treveon L Borba is a 52 y.o. male with a h/o afib, s/p ablation x 2 , last one 6 months ago. He started in afib with RVR on Saturday while quietly sitting at his desk.He was alerted by his Apple phone. He was told to double his BB dose. He remains in afib at 107 bpm .He leaves to go to [place] on Thursday  and cannot be sure if he missed an DOAC dose in the last 3 weeks.  Today, he denies symptoms of palpitations, chest pain, shortness of breath, orthopnea, PND, lower extremity edema, dizziness, presyncope, syncope, or neurologic sequela. The patient is tolerating medications without difficulties and is otherwise without complaint today.   Past Medical History:  Diagnosis Date  . Anemia, iron deficiency 01/30/2017  . Cervicalgia   . CVA (cerebral infarction) 05/01/12   on pradaxa at time of stroke, switched to xarelto  . External thrombosed hemorrhoids   . GERD (gastroesophageal reflux disease)   . Guillain Barr syndrome (HCC)    following seasonal influenza vaccination  . Guillain-Barre syndrome (HCC)    from stroke in 2013  . Hematuria    a. 01/2012 - to f/u with urology as outpt.  . Hernia of unspecified site of abdominal cavity without mention of obstruction or gangrene    of abdominal  . Hx of gastric bypass 01/30/2017   2004 Dr Walsh [facility] weight 450 lbs  . Hypertension   . Iron malabsorption 01/30/2017   Hx gastric bypass 2004  . Morbid obesity s/p gastric bypass   . Nonischemic cardiomyopathy (HCC)    a. 01/2012 Echo: EF 35-40%, Diff HK, mild MR;  b. 01/2012 R&L Heart Cath: mildly elevated R heart pressures (pcwp 17) , nl cors.  . Osteoarthrosis, unspecified whether generalized or localized, ankle and foot    of the knees,ankle and foot  . Pancytopenia   . Persistent atrial fibrillation    a. Pradaxa started 01/2012;  b .s/p TEE/DCCV 02/16/2012; c. recurrent afib->Tikosyn Initiation 03/2012   . Sleep apnea    CPAP compliant  . Status post bypass gastrojejunostomy 01/30/2017  . Stroke (HCC)   . Unspecified intestinal malabsorption   . Urticaria, unspecified   . Vitamin B 12 deficiency    Past Surgical History:  Procedure Laterality Date  . ATRIAL FIBRILLATION ABLATION N/A 06/25/2012   PVI by Dr Allred  . ATRIAL FIBRILLATION ABLATION N/A 10/04/2017   Procedure: ATRIAL FIBRILLATION ABLATION;  Surgeon: Allred, James, MD;  Location: [facility] INVASIVE CV LAB;  Service: Cardiovascular;  Laterality: N/A;  . CARDIOVERSION  02/16/2012   Procedure: CARDIOVERSION;  Surgeon: Brian S Crenshaw, MD;  Location: [facility] ENDOSCOPY;  Service: Cardiovascular;  Laterality: N/A;  . CARDIOVERSION  04/22/2012   Procedure: CARDIOVERSION;  Surgeon: Steven C Klein, MD;  Location: [facility] ENDOSCOPY;  Service: Cardiovascular;  Laterality: N/A;  . CARDIOVERSION N/A 08/22/2017   Procedure: CARDIOVERSION;  Surgeon: Peosta, Tiffany, MD;  Location: [facility] ENDOSCOPY;  Service: Cardiovascular;  Laterality: N/A;  . COLONOSCOPY N/A 07/07/2016   Procedure: COLONOSCOPY;  Surgeon: Patrick Hung, MD;  Location: [facility] ENDOSCOPY;  Service: Endoscopy;  Laterality: N/A;  . ESOPHAGOGASTRODUODENOSCOPY N/A 07/07/2016   Procedure: ESOPHAGOGASTRODUODENOSCOPY (EGD);  Surgeon: Patrick Hung, MD;  Location: [facility] ENDOSCOPY;  Service: Endoscopy;  Laterality: N/A;  . GASTRIC BYPASS  2004  . HERNIA REPAIR    . LEFT AND RIGHT HEART CATHETERIZATION WITH CORONARY ANGIOGRAM N/A 02/13/2012   Procedure:   LEFT AND RIGHT HEART CATHETERIZATION WITH CORONARY ANGIOGRAM;  Surgeon: Michael Cooper, MD;  Location: [facility] CATH LAB;  Service: Cardiovascular;  Laterality: N/A;  . TEE WITHOUT CARDIOVERSION  02/16/2012   Procedure: TRANSESOPHAGEAL ECHOCARDIOGRAM (TEE);  Surgeon: Brian S Crenshaw, MD;  Location: [facility] ENDOSCOPY;  Service: Cardiovascular;  Laterality: N/A;  10a CV  . TEE WITHOUT CARDIOVERSION  06/24/2012   Procedure: TRANSESOPHAGEAL ECHOCARDIOGRAM (TEE);  Surgeon: Peter M  Jordan, MD;  Location: [facility] ENDOSCOPY;  Service: Cardiovascular;  Laterality: N/A;  . TEE WITHOUT CARDIOVERSION N/A 08/22/2017   Procedure: TRANSESOPHAGEAL ECHOCARDIOGRAM (TEE);  Surgeon: , Tiffany, MD;  Location: [facility] ENDOSCOPY;  Service: Cardiovascular;  Laterality: N/A;  . UMBILICAL HERNIA REPAIR     x 2    Current Outpatient Medications  Medication Sig Dispense Refill  . amLODipine (NORVASC) 5 MG tablet TAKE ONE TABLET BY MOUTH ONCE DAILY (Patient taking differently: TAKE ONE TABLET BY MOUTH ONCE DAILY IN THE EVENING) 30 tablet 1  . ferrous sulfate 325 (65 FE) MG tablet Take 325 mg by mouth daily with breakfast.    . folic acid (FOLVITE) 1 MG tablet Take 1 tablet (1 mg total) by mouth daily. (Patient taking differently: Take 1 mg by mouth every evening. ) 100 tablet 3  . furosemide (LASIX) 40 MG tablet Take 20 mg by mouth daily.    . ibuprofen (ADVIL,MOTRIN) 200 MG tablet Take 400 mg by mouth daily as needed for moderate pain.     . losartan (COZAAR) 100 MG tablet Take 0.5 tablets (50 mg total) by mouth 2 (two) times daily. 90 tablet 3  . Methylcellulose, Laxative, (CITRUCEL) 500 MG TABS Take 500 mg by mouth daily.    . metoprolol tartrate (LOPRESSOR) 25 MG tablet Take 1 tablet (25 mg total) by mouth 2 (two) times daily. (Patient taking differently: Take 50 mg by mouth 2 (two) times daily. ) 180 tablet 1  . Multiple Vitamin (MULTIVITAMIN) tablet Take 1-2 tablets by mouth See admin instructions. Take 2 tablets in the morning and 1 tablet in the evening    . omeprazole (PRILOSEC) 40 MG capsule Take 40 mg by mouth daily.   1  . spironolactone (ALDACTONE) 50 MG tablet TAKE ONE TABLET BY MOUTH ONCE DAILY 30 tablet 1  . trolamine salicylate (ASPERCREME) 10 % cream Apply 1 application topically as needed for muscle pain.    . vitamin E (VITAMIN E) 400 UNIT capsule Take 400 Units by mouth daily.    . XARELTO 20 MG TABS tablet TAKE ONE TABLET BY MOUTH DAILY WITH  SUPPER (Patient taking  differently: TAKE 20 MG BY MOUTH DAILY WITH  SUPPER) 30 tablet 1   No current facility-administered medications for this encounter.    Facility-Administered Medications Ordered in Other Encounters  Medication Dose Route Frequency Provider Last Rate Last Dose  . gadopentetate dimeglumine (MAGNEVIST) injection 20 mL  20 mL Intravenous Once PRN Ahern, Antonia B, MD        No Known Allergies  Social History   Socioeconomic History  . Marital status: Married    Spouse name: Not on file  . Number of children: 1  . Years of education: Not on file  . Highest education level: Not on file  Occupational History  . Occupation: Realtor  Social Needs  . Financial resource strain: Not on file  . Food insecurity:    Worry: Not on file    Inability: Not on file  . Transportation needs:    Medical: Not on file      Non-medical: Not on file  Tobacco Use  . Smoking status: Never Smoker  . Smokeless tobacco: Former User    Types: Chew  Substance and Sexual Activity  . Alcohol use: Yes    Comment: occasional  . Drug use: No  . Sexual activity: Yes    Partners: Female  Lifestyle  . Physical activity:    Days per week: Not on file    Minutes per session: Not on file  . Stress: Not on file  Relationships  . Social connections:    Talks on phone: Not on file    Gets together: Not on file    Attends religious service: Not on file    Active member of club or organization: Not on file    Attends meetings of clubs or organizations: Not on file    Relationship status: Not on file  . Intimate partner violence:    Fear of current or ex partner: Not on file    Emotionally abused: Not on file    Physically abused: Not on file    Forced sexual activity: Not on file  Other Topics Concern  . Not on file  Social History Narrative   Lives with wife.  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 Hypertension Mother   .  Hypertension Father   . Heart attack Father 44       Died  . Diabetes Father   . Kidney disease Father        with transplant  . Stroke Paternal Grandmother     ROS- All systems are reviewed and negative except as per the HPI above  Physical Exam: Vitals:   06/10/18 1336  BP: 114/78  Pulse: (!) 107  Weight: 92.9 kg  Height: 5' 6" (1.676 m)   Wt Readings from Last 3 Encounters:  06/10/18 92.9 kg  05/30/18 93 kg  04/22/18 93.6 kg    Labs: Lab Results  Component Value Date   NA 142 09/27/2017   K 4.4 09/27/2017   CL 104 09/27/2017   CO2 26 09/27/2017   GLUCOSE 99 09/27/2017   BUN 11 09/27/2017   CREATININE 0.76 09/27/2017   CALCIUM 8.7 09/27/2017   MG 1.8 08/14/2017   Lab Results  Component Value Date   INR 1.29 05/01/2012   Lab Results  Component Value Date   CHOL 165 06/15/2015   HDL 85 06/15/2015   LDLCALC 71 06/15/2015   TRIG 46 06/15/2015     GEN- The patient is well appearing, alert and oriented x 3 today.   Head- normocephalic, atraumatic Eyes-  Sclera clear, conjunctiva pink Ears- hearing intact Oropharynx- clear Neck- supple, no JVP Lymph- no cervical lymphadenopathy Lungs- Clear to ausculation bilaterally, normal work of breathing Heart- irregular rate and rhythm, no murmurs, rubs or gallops, PMI not laterally displaced GI- soft, NT, ND, + BS Extremities- no clubbing, cyanosis, or edema MS- no significant deformity or atrophy Skin- no rash or lesion Psych- euthymic mood, full affect Neuro- strength and sensation are intact  EKG-Afib at 107 bpm, qrs int 102 ms, qtc 469 ms    Assessment and Plan: 1. Persistent afib Will schedule for TEE guided cardioversion( missed a dose of anticoagulation in the last 3-4 weeks) He will lower the dose of BB back to 25 mg metoprolol tartrate  bid starting in am as he has bradycardia in SR Risk vrs benefit of procedure   explained  and pt wants too proceed CBC/bmet today Continue xarelto 20 mg  daily  F/u here 1/6, sooner if needed  Najiyah Paris C. Micole Delehanty, ANP-C Afib [facility] [address] [place], [place] [address] [phone]     

## 2018-06-10 NOTE — Telephone Encounter (Signed)
New Message   Patient c/o Palpitations:  High priority if patient c/o lightheadedness, shortness of breath, or chest pain  1) How long have you had palpitations/irregular HR/ Afib? Are you having the symptoms now? Since Saturday, yes  2) Are you currently experiencing lightheadedness, SOB or CP?  None today but Saturday had some lightheadedness  3) Do you have a history of afib (atrial fibrillation) or irregular heart rhythm? yes  4) Have you checked your BP or HR? (document readings if available): HR 140'S, 115, 120  5) Are you experiencing any other symptoms? No   Patient states over the weekend he did speak with the on call physician and was advised to take an extra dose of metoprolol. If that does not work to call the office Monday.

## 2018-06-10 NOTE — Patient Instructions (Addendum)
Cardioversion scheduled for Tuesday, November 19th  - Arrive at the [facility] and go to admitting at 12PM  -Do not eat or drink anything after midnight the night prior to your procedure.  - Take all your morning medication with a sip of water prior to arrival.  - You will not be able to drive home after your procedure.  Tomorrow morning return to your normal metoprolol dosing.

## 2018-06-11 ENCOUNTER — Ambulatory Visit (HOSPITAL_BASED_OUTPATIENT_CLINIC_OR_DEPARTMENT_OTHER)
Admission: RE | Admit: 2018-06-11 | Discharge: 2018-06-11 | Disposition: A | Discharge status: Home / Self Care | Payer: BLUE CROSS/BLUE SHIELD | Source: Ambulatory Visit | Attending: Nurse Practitioner | Admitting: Nurse Practitioner

## 2018-06-11 ENCOUNTER — Ambulatory Visit (HOSPITAL_COMMUNITY)
Admission: RE | Admit: 2018-06-11 | Discharge: 2018-06-11 | Disposition: A | Discharge status: Home / Self Care | Payer: BLUE CROSS/BLUE SHIELD | Source: Ambulatory Visit | Attending: Cardiovascular Disease | Admitting: Cardiovascular Disease

## 2018-06-11 ENCOUNTER — Encounter (HOSPITAL_COMMUNITY): Payer: Self-pay

## 2018-06-11 ENCOUNTER — Encounter (HOSPITAL_COMMUNITY)
Admission: RE | Disposition: A | Discharge status: Home / Self Care | Payer: Self-pay | Source: Ambulatory Visit | Attending: Cardiovascular Disease

## 2018-06-11 ENCOUNTER — Ambulatory Visit (HOSPITAL_COMMUNITY): Payer: BLUE CROSS/BLUE SHIELD | Admitting: Certified Registered"

## 2018-06-11 ENCOUNTER — Other Ambulatory Visit: Payer: Self-pay

## 2018-06-11 DIAGNOSIS — Z8249 Family history of ischemic heart disease and other diseases of the circulatory system: Secondary | ICD-10-CM | POA: Insufficient documentation

## 2018-06-11 DIAGNOSIS — Z823 Family history of stroke: Secondary | ICD-10-CM | POA: Diagnosis not present

## 2018-06-11 DIAGNOSIS — I10 Essential (primary) hypertension: Secondary | ICD-10-CM | POA: Diagnosis not present

## 2018-06-11 DIAGNOSIS — Z955 Presence of coronary angioplasty implant and graft: Secondary | ICD-10-CM | POA: Diagnosis not present

## 2018-06-11 DIAGNOSIS — I428 Other cardiomyopathies: Secondary | ICD-10-CM | POA: Diagnosis not present

## 2018-06-11 DIAGNOSIS — Z79899 Other long term (current) drug therapy: Secondary | ICD-10-CM | POA: Insufficient documentation

## 2018-06-11 DIAGNOSIS — I4891 Unspecified atrial fibrillation: Secondary | ICD-10-CM | POA: Diagnosis not present

## 2018-06-11 DIAGNOSIS — I051 Rheumatic mitral insufficiency: Secondary | ICD-10-CM | POA: Insufficient documentation

## 2018-06-11 DIAGNOSIS — Z6832 Body mass index (BMI) 32.0-32.9, adult: Secondary | ICD-10-CM | POA: Insufficient documentation

## 2018-06-11 DIAGNOSIS — G4730 Sleep apnea, unspecified: Secondary | ICD-10-CM | POA: Diagnosis not present

## 2018-06-11 DIAGNOSIS — Z9889 Other specified postprocedural states: Secondary | ICD-10-CM | POA: Diagnosis not present

## 2018-06-11 DIAGNOSIS — Z7901 Long term (current) use of anticoagulants: Secondary | ICD-10-CM | POA: Insufficient documentation

## 2018-06-11 DIAGNOSIS — I340 Nonrheumatic mitral (valve) insufficiency: Secondary | ICD-10-CM

## 2018-06-11 DIAGNOSIS — I110 Hypertensive heart disease with heart failure: Secondary | ICD-10-CM | POA: Diagnosis not present

## 2018-06-11 DIAGNOSIS — D509 Iron deficiency anemia, unspecified: Secondary | ICD-10-CM | POA: Diagnosis not present

## 2018-06-11 DIAGNOSIS — Z8673 Personal history of transient ischemic attack (TIA), and cerebral infarction without residual deficits: Secondary | ICD-10-CM | POA: Insufficient documentation

## 2018-06-11 DIAGNOSIS — Z9884 Bariatric surgery status: Secondary | ICD-10-CM | POA: Insufficient documentation

## 2018-06-11 DIAGNOSIS — I5042 Chronic combined systolic (congestive) and diastolic (congestive) heart failure: Secondary | ICD-10-CM | POA: Diagnosis not present

## 2018-06-11 DIAGNOSIS — I4819 Other persistent atrial fibrillation: Secondary | ICD-10-CM | POA: Insufficient documentation

## 2018-06-11 DIAGNOSIS — Z791 Long term (current) use of non-steroidal anti-inflammatories (NSAID): Secondary | ICD-10-CM | POA: Diagnosis not present

## 2018-06-11 DIAGNOSIS — K219 Gastro-esophageal reflux disease without esophagitis: Secondary | ICD-10-CM | POA: Insufficient documentation

## 2018-06-11 DIAGNOSIS — M1990 Unspecified osteoarthritis, unspecified site: Secondary | ICD-10-CM | POA: Diagnosis not present

## 2018-06-11 HISTORY — PX: TEE WITHOUT CARDIOVERSION: SHX5443

## 2018-06-11 HISTORY — PX: CARDIOVERSION: SHX1299

## 2018-06-11 SURGERY — ECHOCARDIOGRAM, TRANSESOPHAGEAL
Anesthesia: General

## 2018-06-11 MED ORDER — PROPOFOL 10 MG/ML IV BOLUS
INTRAVENOUS | Status: DC | PRN
  Administered 2018-06-11: 20 mg via INTRAVENOUS
  Administered 2018-06-11: 30 mg via INTRAVENOUS
  Administered 2018-06-11: 20 mg via INTRAVENOUS

## 2018-06-11 MED ORDER — PROPOFOL 500 MG/50ML IV EMUL
INTRAVENOUS | Status: DC | PRN
  Administered 2018-06-11: 100 ug/kg/min via INTRAVENOUS

## 2018-06-11 MED ORDER — SODIUM CHLORIDE 0.9 % IV SOLN: INTRAVENOUS | Status: DC

## 2018-06-11 MED ORDER — LACTATED RINGERS IV SOLN
INTRAVENOUS | Status: DC | PRN
  Administered 2018-06-11: 13:00:00 via INTRAVENOUS

## 2018-06-11 NOTE — CV Procedure (Signed)
    Transesophageal Echocardiogram Note  SECUNDINO HALDANE [id number] [birth year]  Procedure: Transesophageal Echocardiogram Indications: Atrial fib   Procedure Details Consent: Obtained Time Out: Verified patient identification, verified procedure, site/side was marked, verified correct patient position, special equipment/implants available, Radiology Safety Procedures followed,  medications/allergies/relevent history reviewed, required imaging and test results available.  Performed  Medications:  During this procedure the patient is administered a  Propofol drip by Sherrilyn Rist, CRNA.   Total of 370 mg propofol for the TEE and cardioversion   Left Ventrical:  Normal LV function   Mitral Valve: normal   Aortic Valve: normal   Tricuspid Valve: normal   Pulmonic Valve:   Left Atrium/ Left atrial appendage: no thrombi   Atrial septum: no PFO or ASD   Aorta: normal    Complications: No apparent complications Patient did tolerate procedure well.      Cardioversion Note  SUFIYAN MCGAFFIGAN [id number] 1965-12-02  Procedure: DC Cardioversion Indications: atrial fib   Procedure Details Consent: Obtained Time Out: Verified patient identification, verified procedure, site/side was marked, verified correct patient position, special equipment/implants available, Radiology Safety Procedures followed,  medications/allergies/relevent history reviewed, required imaging and test results available.  Performed  The patient has been on adequate anticoagulation.  The patient received IV propofol ( see above )  for sedation.  Synchronous cardioversion was performed at 120  joules.  The cardioversion was successful     Complications: No apparent complications Patient did tolerate procedure well.   Vesta Mixer, Montez Hageman., MD, [facility] 06/11/2018, 1:33 PM

## 2018-06-11 NOTE — Discharge Instructions (Signed)
TEE ° °YOU HAD AN CARDIAC PROCEDURE TODAY: Refer to the procedure report and other information in the discharge instructions given to you for any specific questions about what was found during the examination. If this information does not answer your questions, please call [facility] office at [phone] to clarify.  ° °DIET: Your first meal following the procedure should be a light meal and then it is ok to progress to your normal diet. A half-sandwich or bowl of soup is an example of a good first meal. Heavy or fried foods are harder to digest and may make you feel nauseous or bloated. Drink plenty of fluids but you should avoid alcoholic beverages for 24 hours. If you had a esophageal dilation, please see attached instructions for diet.  ° °ACTIVITY: Your care partner should take you home directly after the procedure. You should plan to take it easy, moving slowly for the rest of the day. You can resume normal activity the day after the procedure however YOU SHOULD NOT DRIVE, use power tools, machinery or perform tasks that involve climbing or major physical exertion for 24 hours (because of the sedation medicines used during the test).  ° °SYMPTOMS TO REPORT IMMEDIATELY: °A cardiologist can be reached at any hour. Please call [phone] for any of the following symptoms:  °Vomiting of blood or coffee ground material  °New, significant abdominal pain  °New, significant chest pain or pain under the shoulder blades  °Painful or persistently difficult swallowing  °New shortness of breath  °Black, tarry-looking or red, bloody stools ° °FOLLOW UP:  °Please also call with any specific questions about appointments or follow up tests. ° °Electrical Cardioversion, Care After °This sheet gives you information about how to care for yourself after your procedure. Your health care provider may also give you more specific instructions. If you have problems or questions, contact your health care provider. °What can I  expect after the procedure? °After the procedure, it is common to have: °· Some redness on the skin where the shocks were given. ° °Follow these instructions at home: °· Do not drive for 24 hours if you were given a medicine to help you relax (sedative). °· Take over-the-counter and prescription medicines only as told by your health care provider. °· Ask your health care provider how to check your pulse. Check it often. °· Rest for 48 hours after the procedure or as told by your health care provider. °· Avoid or limit your caffeine use as told by your health care provider. °Contact a health care provider if: °· You feel like your heart is beating too quickly or your pulse is not regular. °· You have a serious muscle cramp that does not go away. °Get help right away if: °· You have discomfort in your chest. °· You are dizzy or you feel faint. °· You have trouble breathing or you are short of breath. °· Your speech is slurred. °· You have trouble moving an arm or leg on one side of your body. °· Your fingers or toes turn cold or blue. °This information is not intended to replace advice given to you by your health care provider. Make sure you discuss any questions you have with your health care provider. °Document Released: 04/30/2013 Document Revised: 02/11/2016 Document Reviewed: 01/14/2016 °Elsevier Interactive Patient Education © 2018 [employer] ° °

## 2018-06-11 NOTE — Interval H&P Note (Signed)
History and Physical Interval Note:  06/11/2018 1:20 PM  Tyrone [name]  has presented today for surgery, with the diagnosis of atrial fibrillation  The various methods of treatment have been discussed with the patient and family. After consideration of risks, benefits and other options for treatment, the patient has consented to  Procedure(s): TRANSESOPHAGEAL ECHOCARDIOGRAM (TEE) (N/A) CARDIOVERSION (N/A) as a surgical intervention .  The patient's history has been reviewed, patient examined, no change in status, stable for surgery.  I have reviewed the patient's chart and labs.  Questions were answered to the patient's satisfaction.     Kristeen Miss

## 2018-06-11 NOTE — Anesthesia Preprocedure Evaluation (Signed)
Anesthesia Evaluation  Patient identified by MRN, date of birth, ID band Patient awake    Reviewed: Allergy & Precautions, H&P , NPO status , Patient's Chart, lab work & pertinent test results  Airway Mallampati: II   Neck ROM: full    Dental   Pulmonary sleep apnea ,    breath sounds clear to auscultation       Cardiovascular hypertension, + Peripheral Vascular Disease  + dysrhythmias Atrial Fibrillation  Rhythm:irregular Rate:Normal     Neuro/Psych H/o Guillan-Barre  Neuromuscular disease CVA    GI/Hepatic GERD  ,  Endo/Other    Renal/GU      Musculoskeletal  (+) Arthritis ,   Abdominal   Peds  Hematology   Anesthesia Other Findings   Reproductive/Obstetrics                             Anesthesia Physical  Anesthesia Plan  ASA: II  Anesthesia Plan: General   Post-op Pain Management:    Induction: Intravenous  PONV Risk Score and Plan: 2 and Ondansetron, Treatment may vary due to age or medical condition and Propofol infusion  Airway Management Planned: Natural Airway, Simple Face Mask and Mask  Additional Equipment:   Intra-op Plan:   Post-operative Plan:   Informed Consent: I have reviewed the patients History and Physical, chart, labs and discussed the procedure including the risks, benefits and alternatives for the proposed anesthesia with the patient or authorized representative who has indicated his/her understanding and acceptance.     Plan Discussed with: CRNA, Anesthesiologist and Surgeon  Anesthesia Plan Comments:         Anesthesia Quick Evaluation

## 2018-06-11 NOTE — Anesthesia Postprocedure Evaluation (Signed)
Anesthesia Post Note  Patient: [name]  Procedure(s) Performed: TRANSESOPHAGEAL ECHOCARDIOGRAM (TEE) (N/A ) CARDIOVERSION (N/A )     Patient location during evaluation: PACU Anesthesia Type: General Level of consciousness: awake and alert Pain management: pain level controlled Vital Signs Assessment: post-procedure vital signs reviewed and stable Respiratory status: spontaneous breathing, nonlabored ventilation and respiratory function stable Cardiovascular status: blood pressure returned to baseline and stable Postop Assessment: no apparent nausea or vomiting Anesthetic complications: no    Last Vitals:  Vitals:   06/11/18 1350 06/11/18 1400  BP: 118/83 118/83  Pulse: (!) 53 (!) 53  Resp: 16 15  Temp:    SpO2: 98% 100%    Last Pain:  Vitals:   06/11/18 1400  TempSrc:   PainSc: 0-No pain                 [name]

## 2018-06-11 NOTE — Transfer of Care (Signed)
Immediate Anesthesia Transfer of Care Note  Patient: Tyrone [name]  Procedure(s) Performed: TRANSESOPHAGEAL ECHOCARDIOGRAM (TEE) (N/A ) CARDIOVERSION (N/A )  Patient Location: PACU  Anesthesia Type:General  Level of Consciousness: awake, alert , oriented and patient cooperative  Airway & Oxygen Therapy: Patient Spontanous Breathing and Patient connected to face mask oxygen  Post-op Assessment: Report given to RN and Post -op Vital signs reviewed and stable  Post vital signs: Reviewed and stable  Last Vitals:  Vitals Value Taken Time  BP 98/71 06/11/2018  1:42 PM  Temp 36.5 C 06/11/2018  1:41 PM  Pulse 51 06/11/2018  1:44 PM  Resp 17 06/11/2018  1:44 PM  SpO2 97 % 06/11/2018  1:44 PM  Vitals shown include unvalidated device data.  Last Pain:  Vitals:   06/11/18 1341  TempSrc: Oral  PainSc: 0-No pain         Complications: No apparent anesthesia complications

## 2018-06-12 ENCOUNTER — Encounter (HOSPITAL_COMMUNITY): Payer: Self-pay | Admitting: Cardiovascular Disease

## 2018-07-09 DIAGNOSIS — G4733 Obstructive sleep apnea (adult) (pediatric): Secondary | ICD-10-CM | POA: Diagnosis not present

## 2018-07-21 ENCOUNTER — Other Ambulatory Visit: Payer: Self-pay | Admitting: Internal Medicine

## 2018-07-26 ENCOUNTER — Other Ambulatory Visit: Payer: Self-pay | Admitting: *Deleted

## 2018-07-29 ENCOUNTER — Ambulatory Visit (HOSPITAL_COMMUNITY): Payer: BLUE CROSS/BLUE SHIELD | Admitting: Nurse Practitioner

## 2018-07-29 MED ORDER — FOLIC ACID 1 MG PO TABS: 1.0000 mg | ORAL_TABLET | Freq: Every day | ORAL | 3 refills | Status: AC

## 2018-08-28 DIAGNOSIS — Z85828 Personal history of other malignant neoplasm of skin: Secondary | ICD-10-CM | POA: Diagnosis not present

## 2018-08-28 DIAGNOSIS — L814 Other melanin hyperpigmentation: Secondary | ICD-10-CM | POA: Diagnosis not present

## 2018-08-28 DIAGNOSIS — L821 Other seborrheic keratosis: Secondary | ICD-10-CM | POA: Diagnosis not present

## 2018-08-28 DIAGNOSIS — B078 Other viral warts: Secondary | ICD-10-CM | POA: Diagnosis not present

## 2018-08-28 DIAGNOSIS — D692 Other nonthrombocytopenic purpura: Secondary | ICD-10-CM | POA: Diagnosis not present

## 2018-09-02 ENCOUNTER — Encounter: Payer: Self-pay | Admitting: Nurse Practitioner

## 2018-09-03 NOTE — Progress Notes (Addendum)
[facility]  PATIENT: Tyrone [name] DOB: 1966-03-14   REASON FOR VISIT: Follow-up for obstructive sleep apnea HISTORY FROM: Patient   HISTORY OF PRESENT ILLNESS: Tyrone [name] is a 53 year old right-handed gentleman with an underlying complex medical history of A. fib, cardiomyopathy, hypertension, obesity, status post gastric bypass in 2004 with over 200 pound weight loss, reflux disease, vitamin D deficiency, history of stroke, history of Guillain-Barr syndrome, arthritis, anemia, vitamin B12 deficiency, who presents for follow-up consultation of his obstructive sleep apnea after recent home sleep testing and started AutoPap therapy. The patient is unaccompanied today. Her first met him on 02/18/2018 at the request of his primary care physician, at which time he reported a prior diagnosis of obstructive sleep apnea and CPAP therapy. He needed reevaluation. I suggested we proceed with a sleep study. His insurance denied any laboratory attended sleep study. He had a sleep test on 03/18/2018 which indicated moderate obstructive sleep apnea with an AHI of 26.1 per hour, average oxygen saturation of 94%, nadir of 86%. He was advised to proceed with AutoPap therapy.  Today, 05/30/2018: [facility] reviewed his AutoPap compliance data from 04/28/2018 through 05/27/2018 which is a total of 30 days, during which time he used his AutoPap 28 days with percent used days greater than 4 hours at 47% only, indicating suboptimal compliance with an average usage of 3 hours and 52 minutes only, residual AHI at goal at 0.6 per hour, 95th percentile of pressure at 8.5 Tyrone [name], leak acceptable with the 95th percentile at 13.8 L/m on a pressure range of 6 Tyrone [name] to 13 Tyrone [name] with EPR. In the past 49 days he has had a similar compliance from one of 4 hours with an average usage of 4 hours of 9 minutes and percent used days greater than 4 hours at 45% only. He reports doing okay, feels okay but no telltale change, pressure is  less than old machine was. He is not always sleeping very well, has had increase in stress, but motivated to continue and intends to increase usage.   UPDATE 2/12/2020CM Tyrone [name], 53 year old male returns for follow-up with a history of obstructive sleep apnea here for AutoPap compliance.  He reports he is not sleeping well due to increase in stress this is not a new symptom for him.  Compliance data dated 08/04/2018-to 09/02/2018 shows compliance greater than 4 hours at 43% for 13 days, less than 4 hours at 47% for 14 days with total compliance usage days 90%.  He had similar compliance at his last visit.  He states sometimes he does not put the machine on when he first goes to sleep as he is watching TV and then he falls asleep and forgets to put it on.  Current pressure 6 to 13 Tyrone [name] EPR level 3.  Leak 95th percentile 10.8 AHI 0.6.  ESS 7.  He returns for reevaluation  REVIEW OF SYSTEMS: Full 14 system review of systems performed and notable only for those listed, all others are neg:  Constitutional: neg  Cardiovascular: neg Ear/Nose/Throat: neg  Skin: neg Eyes: neg Respiratory: neg Gastroitestinal: neg  Hematology/Lymphatic: Easy bruising Endocrine: neg Musculoskeletal:neg Allergy/Immunology: neg Neurological: neg Psychiatric: neg Sleep : Obstructive sleep apnea with AutoPap   ALLERGIES: No Known Allergies  HOME MEDICATIONS: Outpatient Medications Prior to Visit  Medication Sig Dispense Refill  . amLODipine (NORVASC) 5 MG tablet TAKE ONE TABLET BY MOUTH ONCE DAILY (Patient taking differently: TAKE ONE TABLET BY MOUTH ONCE DAILY IN THE EVENING) 30 tablet  1  . ferrous sulfate 325 (65 FE) MG tablet Take 325 mg by mouth daily with breakfast.    . folic acid (FOLVITE) 1 MG tablet Take 1 tablet (1 mg total) by mouth daily. 100 tablet 3  . furosemide (LASIX) 40 MG tablet Take 20 mg by mouth daily.    Marland Kitchen ibuprofen (ADVIL,MOTRIN) 200 MG tablet Take 400 mg by mouth daily as needed for moderate  pain.     Marland Kitchen losartan (COZAAR) 100 MG tablet Take 0.5 tablets (50 mg total) by mouth 2 (two) times daily. 90 tablet 3  . Methylcellulose, Laxative, (CITRUCEL) 500 MG TABS Take 500 mg by mouth daily.    . metoprolol tartrate (LOPRESSOR) 25 MG tablet Take 1 tablet (25 mg total) by mouth 2 (two) times daily. (BETA BLOCKER) 180 tablet 2  . Multiple Vitamin (MULTIVITAMIN) tablet Take 1-2 tablets by mouth See admin instructions. Take 2 tablets in the morning and 1 tablet in the evening    . omeprazole (PRILOSEC) 40 MG capsule Take 40 mg by mouth daily.   1  . spironolactone (ALDACTONE) 50 MG tablet TAKE ONE TABLET BY MOUTH ONCE DAILY 30 tablet 1  . trolamine salicylate (ASPERCREME) 10 % cream Apply 1 application topically as needed for muscle pain.    . vitamin E (VITAMIN E) 400 UNIT capsule Take 400 Units by mouth daily.    Alveda Reasons 20 MG TABS tablet TAKE ONE TABLET BY MOUTH DAILY WITH  SUPPER (Patient taking differently: TAKE 20 MG BY MOUTH DAILY WITH  SUPPER) 30 tablet 1   Facility-Administered Medications Prior to Visit  Medication Dose Route Frequency Provider Last Rate Last Dose  . gadopentetate dimeglumine (MAGNEVIST) injection 20 mL  20 mL Intravenous Once PRN Melvenia Beam, MD        PAST MEDICAL HISTORY: Past Medical History:  Diagnosis Date  . Anemia, iron deficiency 01/30/2017  . Cervicalgia   . CVA (cerebral infarction) 05/01/12   on pradaxa at time of stroke, switched to [place]  . External thrombosed hemorrhoids   . GERD (gastroesophageal reflux disease)   . Guillain Barr syndrome [facility])    following seasonal influenza vaccination  . Guillain-Barre syndrome ([place])    from stroke in 2013  . Hematuria    a. 01/2012 - to f/u with urology as outpt.  Marland Kitchen Hernia of unspecified site of abdominal cavity without mention of obstruction or gangrene    of abdominal  . Hx of gastric bypass 01/30/2017   2004 Dr Volanda Napoleon [facility] weight 450 lbs  . Hypertension   . Iron malabsorption  01/30/2017   Hx gastric bypass 2004  . Morbid obesity s/p gastric bypass   . Nonischemic cardiomyopathy ([place])    a. 01/2012 Echo: EF 35-40%, Diff HK, mild MR;  b. 01/2012 R&L Heart Cath: mildly elevated R heart pressures (pcwp 17) , nl cors.  . Osteoarthrosis, unspecified whether generalized or localized, ankle and foot    of the knees,ankle and foot  . Pancytopenia   . Persistent atrial fibrillation    a. Pradaxa started 01/2012;  b .s/p TEE/DCCV 02/16/2012; c. recurrent afib->Tikosyn Initiation 03/2012  . Sleep apnea    CPAP compliant  . Status post bypass gastrojejunostomy 01/30/2017  . Stroke ([place])   . Unspecified intestinal malabsorption   . Urticaria, unspecified   . Vitamin B 12 deficiency     PAST SURGICAL HISTORY: Past Surgical History:  Procedure Laterality Date  . ATRIAL FIBRILLATION ABLATION N/A 06/25/2012  PVI by Dr Rayann Heman  . ATRIAL FIBRILLATION ABLATION N/A 10/04/2017   Procedure: ATRIAL FIBRILLATION ABLATION;  Surgeon: Thompson Grayer, MD;  Location: [place] CV LAB;  Service: Cardiovascular;  Laterality: N/A;  . CARDIOVERSION  02/16/2012   Procedure: CARDIOVERSION;  Surgeon: Lelon Perla, MD;  Location: [facility] ENDOSCOPY;  Service: Cardiovascular;  Laterality: N/A;  . CARDIOVERSION  04/22/2012   Procedure: CARDIOVERSION;  Surgeon: Deboraha Sprang, MD;  Location[facility] ENDOSCOPY;  Service: Cardiovascular;  Laterality: N/A;  . CARDIOVERSION N/A 08/22/2017   Procedure: CARDIOVERSION;  Surgeon: Skeet Latch, MD;  Location: [facility] ENDOSCOPY;  Service: Cardiovascular;  Laterality: N/A;  . CARDIOVERSION N/A 06/11/2018   Procedure: CARDIOVERSION;  Surgeon: Thayer Headings, MD;  Location: [facility] ENDOSCOPY;  Service: Cardiovascular;  Laterality: N/A;  . COLONOSCOPY N/A 07/07/2016   Procedure: COLONOSCOPY;  Surgeon: Carol Ada, MD;  Location: [facility] ENDOSCOPY;  Service: Endoscopy;  Laterality: N/A;  . ESOPHAGOGASTRODUODENOSCOPY N/A 07/07/2016   Procedure: ESOPHAGOGASTRODUODENOSCOPY (EGD);   Surgeon: Carol Ada, MD;  Location: [facility] ENDOSCOPY;  Service: Endoscopy;  Laterality: N/A;  . GASTRIC BYPASS  2004  . HERNIA REPAIR    . LEFT AND RIGHT HEART CATHETERIZATION WITH CORONARY ANGIOGRAM N/A 02/13/2012   Procedure: LEFT AND RIGHT HEART CATHETERIZATION WITH CORONARY ANGIOGRAM;  Surgeon: Sherren Mocha, MD;  Location: [facility] CATH LAB;  Service: Cardiovascular;  Laterality: N/A;  . TEE WITHOUT CARDIOVERSION  02/16/2012   Procedure: TRANSESOPHAGEAL ECHOCARDIOGRAM (TEE);  Surgeon: Lelon Perla, MD;  Location: [facility] ENDOSCOPY;  Service: Cardiovascular;  Laterality: N/A;  10a CV  . TEE WITHOUT CARDIOVERSION  06/24/2012   Procedure: TRANSESOPHAGEAL ECHOCARDIOGRAM (TEE);  Surgeon: Peter M Martinique, MD;  Location: [facility] ENDOSCOPY;  Service: Cardiovascular;  Laterality: N/A;  . TEE WITHOUT CARDIOVERSION N/A 08/22/2017   Procedure: TRANSESOPHAGEAL ECHOCARDIOGRAM (TEE);  Surgeon: Skeet Latch, MD;  Location: [place];  Service: Cardiovascular;  Laterality: N/A;  . TEE WITHOUT CARDIOVERSION N/A 06/11/2018   Procedure: TRANSESOPHAGEAL ECHOCARDIOGRAM (TEE);  Surgeon: Acie Fredrickson Wonda Cheng, MD;  Location: [facility] ENDOSCOPY;  Service: Cardiovascular;  Laterality: N/A;  . UMBILICAL HERNIA REPAIR     x 2    FAMILY HISTORY: Family History  Problem Relation Age of Onset  . Hypertension Mother   . Hypertension Father   . Heart attack Father 33       Died  . Diabetes Father   . Kidney disease Father        with transplant  . Stroke Paternal Grandmother     SOCIAL HISTORY: Social History   Socioeconomic History  . Marital status: Married    Spouse name: Not on file  . Number of children: 1  . Years of education: Not on file  . Highest education level: Not on file  Occupational History  . Occupation: Cabin crew  Social Needs  . Financial resource strain: Not on file  . Food insecurity:    Worry: Not on file    Inability: Not on file  . Transportation needs:    Medical: Not on file    Non-medical:  Not on file  Tobacco Use  . Smoking status: Never Smoker  . Smokeless tobacco: Former Systems developer    Types: Chew  Substance and Sexual Activity  . Alcohol use: Yes    Comment: occasional  . Drug use: No  . Sexual activity: Yes    Partners: Female  Lifestyle  . Physical activity:    Days per week: Not on file    Minutes per session: Not on file  .  Stress: Not on file  Relationships  . Social connections:    Talks on phone: Not on file    Gets together: Not on file    Attends religious service: Not on file    Active member of club or organization: Not on file    Attends meetings of clubs or organizations: Not on file    Relationship status: Not on file  . Intimate partner violence:    Fear of current or ex partner: Not on file    Emotionally abused: Not on file    Physically abused: Not on file    Forced sexual activity: Not on file  Other Topics Concern  . Not on file  Social History Narrative   Lives with wife.  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  Generalized: Well developed, in no acute distress  Head: normocephalic and atraumatic,. Oropharynx benign  Neck: Supple,  Musculoskeletal: No deformity   Neurological examination   Mentation: Alert oriented to time, place, history taking. Attention span and concentration appropriate. Recent and remote memory intact.  Follows all commands speech and language fluent.   Cranial nerve II-XII: Pupils were equal round reactive to light extraocular movements were full, visual field were full on confrontational test. Facial sensation and strength were normal. hearing was intact to finger rubbing bilaterally. Uvula tongue midline. head turning and shoulder shrug were normal and symmetric.Tongue protrusion into cheek strength was normal. Motor: normal  bulk and tone, full strength in the BUE, BLE, . No focal weakness Sensory: normal and symmetric to light touch,   Coordination: finger-nose-finger, heel-to-shin bilaterally, no dysmetria Gait and Station: Rising up from seated position without assistance, normal stance,  moderate stride, good arm swing, smooth turning, able to perform tiptoe, and heel walking without difficulty. Tandem gait is steady  DIAGNOSTIC DATA (LABS, IMAGING, TESTING) - I reviewed patient records, labs, notes, testing and imaging myself where available.  Lab Results  Component Value Date   WBC 3.7 (L) 06/10/2018   HGB 12.7 (L) 06/10/2018   HCT 39.2 06/10/2018   MCV 99.7 06/10/2018   PLT 137 (L) 06/10/2018      Component Value Date/Time   NA 137 06/10/2018 1448   NA 142 09/27/2017 1144   K 4.0 06/10/2018 1448   CL 105 06/10/2018 1448   CO2 26 06/10/2018 1448   GLUCOSE 126 (H) 06/10/2018 1448   BUN 11 06/10/2018 1448   BUN 11 09/27/2017 1144   CREATININE 0.88 06/10/2018 1448   CREATININE 0.80 04/12/2016 1441   CALCIUM 8.5 (L) 06/10/2018 1448   PROT 6.4 08/14/2017 0936   ALBUMIN 4.0 08/14/2017 0936   AST 25 08/14/2017 0936   ALT 18 08/14/2017 0936   ALKPHOS 77 08/14/2017 0936   BILITOT 0.6 08/14/2017 0936   GFRNONAA >60 06/10/2018 1448   GFRAA >60 06/10/2018 1448   Lab Results  Component Value Date   CHOL 165 06/15/2015   HDL 85 06/15/2015   LDLCALC 71 06/15/2015   TRIG 46 06/15/2015   CHOLHDL 1.9 06/15/2015   Lab Results  Component Value Date   HGBA1C 5.6 05/01/2012   Lab Results  Component Value Date   VITAMINB12 512 08/14/2017   Lab Results  Component Value Date  TSH 2.220 08/14/2017      ASSESSMENT AND PLAN Quill Grinder Hallis a very pleasant 45 year oldmalewith an underlying complex medical history of A. fib, cardiomyopathy, hypertension, obesity, status post gastric bypass in 2004 with over 200 pound weight loss, reflux disease, vitamin D deficiency, history of stroke, history of  Guillain-Barr syndrome, arthritis, anemia, vitamin B12 deficiency, whopresents for Follow-up consultation of his obstructive sleep apnea which was determined to be in the moderate range by home sleep testing in August 2019. He continues to be  consistent late with putting it on but does not keep it on a very long some nights, reports difficulty maintaining sleep, increase in stress lately. Compliance data dated 08/04/2018-to 09/02/2018 shows compliance greater than 4 hours at 43% for 13 days, less than 4 hours at 47% for 14 days with total compliance usage days 90%  CPAP compliance greater than 4 hours at 43% reviewed data with patient.  Made him aware he needs to increase the time each day using the machine best to put it on as soon as he goes to bed. Continue same settings Follow-up in 3 months Dennie Bible, [facility], [facility], APRN  [facility] [address] [place], [place] [address] [phone]  I reviewed the above note and documentation by the Nurse Practitioner and agree with the history, physical exam, assessment and plan as outlined above. I was immediately available for face-to-face consultation. Star Age, MD, PhD [facility])

## 2018-09-04 ENCOUNTER — Ambulatory Visit: Payer: BLUE CROSS/BLUE SHIELD | Admitting: Nurse Practitioner

## 2018-09-04 ENCOUNTER — Encounter: Payer: Self-pay | Admitting: Nurse Practitioner

## 2018-09-04 VITALS — BP 128/82 | HR 59 | Ht 66.0 in | Wt 212.2 lb

## 2018-09-04 DIAGNOSIS — G4733 Obstructive sleep apnea (adult) (pediatric): Secondary | ICD-10-CM

## 2018-09-04 DIAGNOSIS — Z9989 Dependence on other enabling machines and devices: Secondary | ICD-10-CM | POA: Diagnosis not present

## 2018-09-04 NOTE — Patient Instructions (Signed)
CPAP compliance greater than 4 hours at 43% Continue same settings Follow-up in 3 months

## 2018-09-17 ENCOUNTER — Encounter: Payer: Self-pay | Admitting: Hematology

## 2018-09-17 ENCOUNTER — Telehealth: Payer: Self-pay | Admitting: Hematology

## 2018-09-17 NOTE — Telephone Encounter (Signed)
A new hem referral has been scheduled for the pt to see Dr. Mosetta Putt on 3/20 at 1015am w/labs. Appt date and time has been given to the pt's wife. Letter mailed.

## 2018-09-23 ENCOUNTER — Encounter: Payer: Self-pay | Admitting: *Deleted

## 2018-10-10 ENCOUNTER — Telehealth: Payer: Self-pay | Admitting: Hematology

## 2018-10-10 NOTE — Telephone Encounter (Signed)
[name] cld wanting to reschedule her husband's appt. Pt has been rescheduled to see Dr. Mosetta Putt on 5/20 at 915am. Agreed to the appt date and time.

## 2018-10-11 ENCOUNTER — Inpatient Hospital Stay: Payer: BLUE CROSS/BLUE SHIELD | Admitting: Hematology

## 2018-11-15 DIAGNOSIS — G4733 Obstructive sleep apnea (adult) (pediatric): Secondary | ICD-10-CM | POA: Diagnosis not present

## 2018-11-18 ENCOUNTER — Telehealth: Payer: Self-pay | Admitting: Internal Medicine

## 2018-11-18 NOTE — Telephone Encounter (Signed)
Left message to return my call.  

## 2018-11-18 NOTE — Telephone Encounter (Signed)
New Message    Patient c/o Palpitations:  High priority if patient c/o lightheadedness, shortness of breath, or chest pain  1) How long have you had palpitations/irregular HR/ Afib? Are you having the symptoms now? Since Saturday, no symptoms now he says he wears an apple watch and it told him he was in Afib 1) Are you currently experiencing lightheadedness, SOB or CP? No  2) Do you have a history of afib (atrial fibrillation) or irregular heart rhythm? Yes  3) Have you checked your BP or HR? (document readings if available): Current 132  Are you experiencing any other symptoms? No other symptoms

## 2018-11-18 NOTE — Telephone Encounter (Signed)
The patient called to inform us that he has returned to Afib.  Apparently, he has had 2 cardioversions and 1 ablation over the years, but on Saturday at 2:00 pm his Apple Watch indicated Afib with HR 105.    He has always been asymptomatic and remains today.  Today, his HR has escalated to 144 bpm, so he thought he'd give [place] a call for further advisement.  He was thankful for the call.

## 2018-11-19 ENCOUNTER — Encounter (HOSPITAL_COMMUNITY): Payer: Self-pay | Admitting: Nurse Practitioner

## 2018-11-19 ENCOUNTER — Other Ambulatory Visit: Payer: Self-pay

## 2018-11-19 ENCOUNTER — Ambulatory Visit (HOSPITAL_COMMUNITY)
Admission: RE | Admit: 2018-11-19 | Discharge: 2018-11-19 | Disposition: A | Discharge status: Home / Self Care | Payer: BLUE CROSS/BLUE SHIELD | Source: Ambulatory Visit | Attending: Nurse Practitioner | Admitting: Nurse Practitioner

## 2018-11-19 ENCOUNTER — Other Ambulatory Visit (HOSPITAL_COMMUNITY): Payer: Self-pay | Admitting: *Deleted

## 2018-11-19 VITALS — BP 124/98 | HR 118 | Ht 66.0 in | Wt 203.0 lb

## 2018-11-19 DIAGNOSIS — I4819 Other persistent atrial fibrillation: Secondary | ICD-10-CM | POA: Diagnosis not present

## 2018-11-19 MED ORDER — METOPROLOL TARTRATE 25 MG PO TABS: ORAL_TABLET | ORAL | 2 refills | Status: DC

## 2018-11-19 NOTE — Progress Notes (Signed)
Electrophysiology TeleHealth Note   Due to national recommendations of social distancing due to COVID 19, Audio/video telehealth visit is felt to be most appropriate for this patient at this time.  See MyChart message/consent below from today for patient consent regarding telehealth for the [facility].    Date:  11/19/2018   ID:  Tyrone [name], DOB 1966-01-22, MRN [id number]  Location: home  Provider location: [address] [place], [place] [address] Evaluation Performed:  Add on for afib  PCP:  Shirlean Mylar, MD  Primary Cardiologist: none Primary Electrophysiologist: Dr. [name]   CC: Persistent afib since Saturday   History of Present Illness: Tyrone [name] is a 53 y.o. male who presents via video conferencing for a telehealth visit today.    He reports sudden onset of afib since Saturday. No known triggers. He usually does not know when he is in afib, other than his apple watch alerts him.  Hs v rates are in the 100's with exertion, reports  V rates at rest in the 90's, since increase of BB to 50 mg bid over the weekend. He has has 2 prior ablations and last cardioversion was in November 2019. He reports 2-3 missed does of Doac in the last few weeks, last missed dose last Thursday. Last TEE showed EF at 40-45%.  Today, he denies symptoms of palpitations, chest pain, shortness of breath, orthopnea, PND, lower extremity edema, claudication, dizziness, presyncope, syncope, bleeding, or neurologic sequela. The patient is tolerating medications without difficulties and is otherwise without complaint today.   he denies symptoms of cough, fevers, chills, or new SOB worrisome for COVID 19.     Atrial Fibrillation Risk Factors:  he does have symptoms or diagnosis of sleep apnea. he is compliant with CPAP therapy. he does not have a history of rheumatic fever. he does not have a history of alcohol use. The patient does not have a history of early familial atrial fibrillation  or other arrhythmias.  he has a BMI of Body mass index is 32.77 kg/m.Marland Kitchen Filed Weights   11/19/18 1314  Weight: 92.1 kg    Past Medical History:  Diagnosis Date  . Anemia, iron deficiency 01/30/2017  . Cervicalgia   . CVA (cerebral infarction) 05/01/12   on pradaxa at time of stroke, switched to xarelto  . External thrombosed hemorrhoids   . GERD (gastroesophageal reflux disease)   . Guillain Barr syndrome [facility])    following seasonal influenza vaccination  . Guillain-Barre syndrome (HCC)    from stroke in 2013  . Hematuria    a. 01/2012 - to f/u with urology as outpt.  Marland Kitchen Hernia of unspecified site of abdominal cavity without mention of obstruction or gangrene    of abdominal  . Hx of gastric bypass 01/30/2017   2004 Dr Clent Ridges [facility] weight 450 lbs  . Hypertension   . Iron malabsorption 01/30/2017   Hx gastric bypass 2004  . Morbid obesity s/p gastric bypass   . Nonischemic cardiomyopathy (HCC)    a. 01/2012 Echo: EF 35-40%, Diff HK, mild MR;  b. 01/2012 R&L Heart Cath: mildly elevated R heart pressures (pcwp 17) , nl cors.  . Osteoarthrosis, unspecified whether generalized or localized, ankle and foot    of the knees,ankle and foot  . Pancytopenia   . Persistent atrial fibrillation    a. Pradaxa started 01/2012;  b .s/p TEE/DCCV 02/16/2012; c. recurrent afib->Tikosyn Initiation 03/2012  . Sleep apnea    CPAP  compliant  . Status post bypass gastrojejunostomy 01/30/2017  . Stroke (HCC)   . Unspecified intestinal malabsorption   . Urticaria, unspecified   . Vitamin B 12 deficiency    Past Surgical History:  Procedure Laterality Date  . ATRIAL FIBRILLATION ABLATION N/A 06/25/2012   PVI by Dr [name]  . ATRIAL FIBRILLATION ABLATION N/A 10/04/2017   Procedure: ATRIAL FIBRILLATION ABLATION;  Surgeon: Hillis Range, MD;  Location: [facility] INVASIVE CV LAB;  Service: Cardiovascular;  Laterality: N/A;  . CARDIOVERSION  02/16/2012   Procedure: CARDIOVERSION;  Surgeon: [name], MD;  Location: [facility] ENDOSCOPY;  Service: Cardiovascular;  Laterality: N/A;  . CARDIOVERSION  04/22/2012   Procedure: CARDIOVERSION;  Surgeon: [name], MD;  Location: [facility] ENDOSCOPY;  Service: Cardiovascular;  Laterality: N/A;  . CARDIOVERSION N/A 08/22/2017   Procedure: CARDIOVERSION;  Surgeon: Chilton Si, MD;  Location: [facility] ENDOSCOPY;  Service: Cardiovascular;  Laterality: N/A;  . CARDIOVERSION N/A 06/11/2018   Procedure: CARDIOVERSION;  Surgeon: Vesta Mixer, MD;  Location: [facility] ENDOSCOPY;  Service: Cardiovascular;  Laterality: N/A;  . COLONOSCOPY N/A 07/07/2016   Procedure: COLONOSCOPY;  Surgeon: Jeani Hawking, MD;  Location: [facility] ENDOSCOPY;  Service: Endoscopy;  Laterality: N/A;  . ESOPHAGOGASTRODUODENOSCOPY N/A 07/07/2016   Procedure: ESOPHAGOGASTRODUODENOSCOPY (EGD);  Surgeon: Jeani Hawking, MD;  Location: [facility] ENDOSCOPY;  Service: Endoscopy;  Laterality: N/A;  . GASTRIC BYPASS  2004  . HERNIA REPAIR    . LEFT AND RIGHT HEART CATHETERIZATION WITH CORONARY ANGIOGRAM N/A 02/13/2012   Procedure: LEFT AND RIGHT HEART CATHETERIZATION WITH CORONARY ANGIOGRAM;  Surgeon: Tonny Bollman, MD;  Location: [facility] CATH LAB;  Service: Cardiovascular;  Laterality: N/A;  . TEE WITHOUT CARDIOVERSION  02/16/2012   Procedure: TRANSESOPHAGEAL ECHOCARDIOGRAM (TEE);  Surgeon: [name], MD;  Location: [facility] ENDOSCOPY;  Service: Cardiovascular;  Laterality: N/A;  10a CV  . TEE WITHOUT CARDIOVERSION  06/24/2012   Procedure: TRANSESOPHAGEAL ECHOCARDIOGRAM (TEE);  Surgeon: [name], MD;  Location: [facility] ENDOSCOPY;  Service: Cardiovascular;  Laterality: N/A;  . TEE WITHOUT CARDIOVERSION N/A 08/22/2017   Procedure: TRANSESOPHAGEAL ECHOCARDIOGRAM (TEE);  Surgeon: Chilton Si, MD;  Location: [facility] ENDOSCOPY;  Service: Cardiovascular;  Laterality: N/A;  . TEE WITHOUT CARDIOVERSION N/A 06/11/2018   Procedure: TRANSESOPHAGEAL ECHOCARDIOGRAM (TEE);  Surgeon: Elease Hashimoto Deloris Ping, MD;  Location: [facility] ENDOSCOPY;   Service: Cardiovascular;  Laterality: N/A;  . UMBILICAL HERNIA REPAIR     x 2     Current Outpatient Medications  Medication Sig Dispense Refill  . amLODipine (NORVASC) 5 MG tablet TAKE ONE TABLET BY MOUTH ONCE DAILY (Patient taking differently: TAKE ONE TABLET BY MOUTH ONCE DAILY IN THE EVENING) 30 tablet 1  . ferrous sulfate 325 (65 FE) MG tablet Take 325 mg by mouth daily with breakfast.    . folic acid (FOLVITE) 1 MG tablet Take 1 tablet (1 mg total) by mouth daily. 100 tablet 3  . furosemide (LASIX) 40 MG tablet Take 20 mg by mouth daily.    Marland Kitchen losartan (COZAAR) 100 MG tablet Take 0.5 tablets (50 mg total) by mouth 2 (two) times daily. 90 tablet 3  . Methylcellulose, Laxative, (CITRUCEL) 500 MG TABS Take 500 mg by mouth daily.    . Multiple Vitamin (MULTIVITAMIN) tablet Take 1-2 tablets by mouth See admin instructions. Take 2 tablets in the morning and 1 tablet in the evening    . omeprazole (PRILOSEC) 40 MG capsule Take 40 mg by mouth daily.   1  . spironolactone (ALDACTONE) 50 MG tablet TAKE ONE  TABLET BY MOUTH ONCE DAILY 30 tablet 1  . trolamine salicylate (ASPERCREME) 10 % cream Apply 1 application topically as needed for muscle pain.    . vitamin E (VITAMIN E) 400 UNIT capsule Take 400 Units by mouth daily.    [name] Hurl. XARELTO 20 MG TABS tablet TAKE ONE TABLET BY MOUTH DAILY WITH  SUPPER (Patient taking differently: TAKE 20 MG BY MOUTH DAILY WITH  SUPPER) 30 tablet 1  . ibuprofen (ADVIL,MOTRIN) 200 MG tablet Take 400 mg by mouth daily as needed for moderate pain.     . metoprolol tartrate (LOPRESSOR) 25 MG tablet Take 75mg  in the AM and 50mg  in the PM 180 tablet 2   No current facility-administered medications for this encounter.    Facility-Administered Medications Ordered in Other Encounters  Medication Dose Route Frequency Provider Last Rate Last Dose  . gadopentetate dimeglumine (MAGNEVIST) injection 20 mL  20 mL Intravenous Once PRN Anson FretAhern, Antonia B, MD        Allergies:    Patient has no known allergies.   Social History:  The patient  reports that he has never smoked. He quit smokeless tobacco use about 16 years ago.  His smokeless tobacco use included chew. He reports current alcohol use. He reports that he does not use drugs.   Family History:  The patient's  family history includes Diabetes in his father; Heart attack (age of onset: 5344) in his father; Hypertension in his father and mother; Kidney disease in his father; Stroke in his paternal grandmother.    ROS:  Please see the history of present illness.   All other systems are personally reviewed and negative.   Exam: Well appearing, alert and conversant, regular work of breathing,  good skin color  Recent Labs: 06/10/2018: BUN 11; Creatinine, Ser 0.88; Hemoglobin 12.7; Platelets 137; Potassium 4.0; Sodium 137  personally reviewed    Other studies personally reviewed: Epic records reviewed    ASSESSMENT AND PLAN:  1. Persistent atrial fibrillation We discussed antiarrythmic's Unfortunately last TEE showed EF of 40-45% So 1c agents not an option Multaq as well not a good option because of LV dysfunction On the young side for long term amiodarone Has already had 2 ablations  Determined in the past not  to be a tikosyn candidate for prolonged qtc. I discussed with Dr. [name] and he advised DCCV As pt has missed DOAC doses, he will not be eligible for cardioversion until at least 5/15 TEE is not advisable at this time because of Covid and possible aerolization  Increase metoprolol to 75 mg am and contiue 50 mg opm   This patients CHA2DS2-VASc Score and unadjusted Ischemic Stroke Rate (% per year) is equal to 2.2 % stroke rate/year from a score of 2  Above score calculated as 1 point each if present [CHF, HTN, DM, Vascular=MI/PAD/Aortic Plaque, Age if 65-74, or Male] Above score calculated as 2 points each if present [Age > 75, or Stroke/TIA/TE]   COVID screen The patient does not have  any symptoms that suggest any further testing/ screening at this time.  Social distancing reinforced today.    Follow-up:  After cardioversion, potentially after 5/15  Current medicines are reviewed at length with the patient today.   The patient has concerns regarding his medicines. Unfortunately, he is not a candidate for antiarrythmic's at this time The following changes were made today:  Increase in BB in am It would be good to repeat TTE when returns to SR Labs/ tests ordered  today include: none No orders of the defined types were placed in this encounter.   Patient Risk:  after full review of this patients clinical status, I feel that they are atmoderate risk at this time.   Today, I have spent 30  minutes with the patient with telehealth technology discussing afib and treatment  Signed, [name] NP  11/19/2018 2:40 PM  [facility] [address] [place], [place] [address] [phone]   I hereby voluntarily request, consent and authorize the [facility] and its employed or contracted physicians, physician assistants, nurse practitioners or other licensed health care professionals (the Practitioner), to provide me with telemedicine health care services (the "Services") as deemed necessary by the treating Practitioner. I acknowledge and consent to receive the Services by the Practitioner via telemedicine. I understand that the telemedicine visit will involve communicating with the Practitioner through live audiovisual communication technology and the disclosure of certain medical information by electronic transmission. I acknowledge that I have been given the opportunity to request an in-person assessment or other available alternative prior to the telemedicine visit and am voluntarily participating in the telemedicine visit.   I understand that I have the right to withhold or withdraw my consent to the use of telemedicine in the course of  my care at any time, without affecting my right to future care or treatment, and that the Practitioner or I may terminate the telemedicine visit at any time. I understand that I have the right to inspect all information obtained and/or recorded in the course of the telemedicine visit and may receive copies of available information for a reasonable fee.  I understand that some of the potential risks of receiving the Services via telemedicine include:   Delay or interruption in medical evaluation due to technological equipment failure or disruption;  Information transmitted may not be sufficient (e.g. poor resolution of images) to allow for appropriate medical decision making by the Practitioner; and/or  In rare instances, security protocols could fail, causing a breach of personal health information.   Furthermore, I acknowledge that it is my responsibility to provide information about my medical history, conditions and care that is complete and accurate to the best of my ability. I acknowledge that Practitioner's advice, recommendations, and/or decision may be based on factors not within their control, such as incomplete or inaccurate data provided by me or distortions of diagnostic images or specimens that may result from electronic transmissions. I understand that the practice of medicine is not an exact science and that Practitioner makes no warranties or guarantees regarding treatment outcomes. I acknowledge that I will receive a copy of this consent concurrently upon execution via email to the email address I last provided but may also request a printed copy by calling the office of the [facility].  I understand that my insurance will be billed for this visit.   I have read or had this consent read to me.  I understand the contents of this consent, which adequately explains the benefits and risks of the Services being provided via telemedicine.  I have been provided ample opportunity  to ask questions regarding this consent and the Services and have had my questions answered to my satisfaction.  I give my informed consent for the services to be provided through the use of telemedicine in my medical care  By participating in this telemedicine visit I agree to the above.

## 2018-11-19 NOTE — Telephone Encounter (Signed)
Patient still in AF this morning HRs are slightly more controlled in the low 100s. He did increase his metoprolol to 50mg  bid yesterday. BP 120s/70s.  Questioning if he could possibly be resistant to metoprolol since he has been on it a long time also questioning should he be on something stronger. Pt is not symptomatic at this point only knew in AF due to his apple watch. Will set up for virtual visit to assess and make plan.

## 2018-11-22 ENCOUNTER — Encounter (HOSPITAL_COMMUNITY): Payer: Self-pay

## 2018-11-22 ENCOUNTER — Other Ambulatory Visit (HOSPITAL_COMMUNITY): Payer: Self-pay | Admitting: *Deleted

## 2018-11-25 ENCOUNTER — Telehealth (HOSPITAL_COMMUNITY): Payer: Self-pay | Admitting: *Deleted

## 2018-11-25 NOTE — Telephone Encounter (Signed)
Patient called with update on HRs. His HRs all weekend were around 100. This morning according to his watch today his HR has been in the 60s and regular. He thinks he is back in rhythm. He will track HRs today and call tomorrow with update. If HRs are still controlled will cancel cardioversion. Pt will call tomorrow.

## 2018-11-28 NOTE — Telephone Encounter (Signed)
Patient monitored HR for several days - staying steadily in the 60s. BP 103/68-121/81. He is feeling great. He will stay on the higher dose of metoprolol for the next several weeks and be in touch with his heart rates to determine if he should just stay on higher doses of metoprolol or return to previous dose. Pt in agreement. Cardioversion canceled.

## 2018-12-03 ENCOUNTER — Telehealth: Payer: Self-pay | Admitting: *Deleted

## 2018-12-03 ENCOUNTER — Encounter: Payer: Self-pay | Admitting: Adult Health

## 2018-12-03 NOTE — Telephone Encounter (Signed)
Called pt to convert appt to VV [email or web address] Due to current COVID 19 pandemic, our office is severely reducing in office visits until further notice, in order to minimize the risk to our patients and healthcare providers. Please call back.

## 2018-12-04 NOTE — Telephone Encounter (Signed)
E mail sent

## 2018-12-04 NOTE — Telephone Encounter (Signed)
5-13 Pt wife has called and she gave verbal consent to file insurance for [email or web address] vv email to use is:[email or web address]  Pt understands that although there may be some limitations with this type of visit, we will take all precautions to reduce any security or privacy concerns.  Pt understands that this will be treated like an in office visit and we will file with pt's insurance, and there may be a patient responsible charge related to this service.

## 2018-12-05 ENCOUNTER — Other Ambulatory Visit: Payer: Self-pay

## 2018-12-05 ENCOUNTER — Ambulatory Visit (INDEPENDENT_AMBULATORY_CARE_PROVIDER_SITE_OTHER): Payer: BLUE CROSS/BLUE SHIELD | Admitting: Adult Health

## 2018-12-05 ENCOUNTER — Encounter: Payer: Self-pay | Admitting: Adult Health

## 2018-12-05 DIAGNOSIS — Z9989 Dependence on other enabling machines and devices: Secondary | ICD-10-CM

## 2018-12-05 DIAGNOSIS — G4733 Obstructive sleep apnea (adult) (pediatric): Secondary | ICD-10-CM

## 2018-12-05 NOTE — Progress Notes (Signed)
Fax confirmation received for cpap supplies to [employer].   [phone].

## 2018-12-05 NOTE — Telephone Encounter (Signed)
Resent to [email or web address].

## 2018-12-05 NOTE — Progress Notes (Addendum)
PATIENT: Tyrone [name] DOB: [birth year]  REASON FOR VISIT: follow up HISTORY FROM: patient  Virtual Visit via Video Note  I connected with Tyrone [name] on 12/05/18 at  1:00 PM EDT by a video enabled telemedicine application located remotely at [facility] and verified that I am speaking with the correct person using two identifiers who was located at their own home.   I discussed the limitations of evaluation and management by telemedicine and the availability of in person appointments. The patient expressed understanding and agreed to proceed.   PATIENT: Tyrone [name] DOB: 15-Dec-1965  REASON FOR VISIT: follow up HISTORY FROM: patient  HISTORY OF PRESENT ILLNESS: Today 12/05/18: Tyrone [name] is a 53 year old male with a history of obstructive sleep apnea on CPAP.  He returns today for a virtual visit.  His download indicates that he used his machine 27 out of 30 days for compliance of 90%.  He uses machine greater than 4 hours 10 out of 30 days for compliance of 33%.  On average he uses his machine 3 hours and 44 minutes.  His residual AHI is 0.6 on 6-13 cm of water with EPR 3.  He does not have a significant leak.  He states that most nights he is watching television and tends to fall asleep without putting the CPAP on and will put it on when he wakes up later on.  He denies any significant daytime sleepiness.  He joins me today for virtual visit.  REVIEW OF SYSTEMS: Out of a complete 14 system review of symptoms, the patient complains only of the following symptoms, and all other reviewed systems are negative.  See HPI  ALLERGIES: No Known Allergies  HOME MEDICATIONS: Outpatient Medications Prior to Visit  Medication Sig Dispense Refill  . amLODipine (NORVASC) 5 MG tablet TAKE ONE TABLET BY MOUTH ONCE DAILY (Patient taking differently: TAKE ONE TABLET BY MOUTH ONCE DAILY IN THE EVENING) 30 tablet 1  . ferrous sulfate 325 (65 FE) MG tablet Take 325 mg by mouth daily  with breakfast.    . folic acid (FOLVITE) 1 MG tablet Take 1 tablet (1 mg total) by mouth daily. 100 tablet 3  . furosemide (LASIX) 40 MG tablet Take 20 mg by mouth daily.    Marland Kitchen ibuprofen (ADVIL,MOTRIN) 200 MG tablet Take 400 mg by mouth daily as needed for moderate pain.     Marland Kitchen losartan (COZAAR) 100 MG tablet Take 0.5 tablets (50 mg total) by mouth 2 (two) times daily. 90 tablet 3  . Methylcellulose, Laxative, (CITRUCEL) 500 MG TABS Take 500 mg by mouth daily.    . metoprolol tartrate (LOPRESSOR) 25 MG tablet Take 75mg  in the AM and 50mg  in the PM 180 tablet 2  . Multiple Vitamin (MULTIVITAMIN) tablet Take 1-2 tablets by mouth See admin instructions. Take 2 tablets in the morning and 1 tablet in the evening    . omeprazole (PRILOSEC) 40 MG capsule Take 40 mg by mouth daily.   1  . spironolactone (ALDACTONE) 50 MG tablet TAKE ONE TABLET BY MOUTH ONCE DAILY 30 tablet 1  . trolamine salicylate (ASPERCREME) 10 % cream Apply 1 application topically as needed for muscle pain.    . vitamin E (VITAMIN E) 400 UNIT capsule Take 400 Units by mouth daily.    [name] Hurl 20 MG TABS tablet TAKE ONE TABLET BY MOUTH DAILY WITH  SUPPER (Patient taking differently: TAKE 20 MG BY MOUTH DAILY WITH  SUPPER) 30 tablet  1   Facility-Administered Medications Prior to Visit  Medication Dose Route Frequency Provider Last Rate Last Dose  . gadopentetate dimeglumine (MAGNEVIST) injection 20 mL  20 mL Intravenous Once PRN Anson FretAhern, Antonia B, MD        PAST MEDICAL HISTORY: Past Medical History:  Diagnosis Date  . Anemia, iron deficiency 01/30/2017  . Cervicalgia   . CVA (cerebral infarction) 05/01/12   on pradaxa at time of stroke, switched to xarelto  . External thrombosed hemorrhoids   . GERD (gastroesophageal reflux disease)   . Guillain Barr syndrome [facility](HCC)    following seasonal influenza vaccination  . Guillain-Barre syndrome (HCC)    from stroke in 2013  . Hematuria    a. 01/2012 - to f/u with urology as outpt.  Marland Kitchen.  Hernia of unspecified site of abdominal cavity without mention of obstruction or gangrene    of abdominal  . Hx of gastric bypass 01/30/2017   [facility]  . Hypertension   . Iron malabsorption 01/30/2017   Hx gastric bypass 2004  . Morbid obesity s/p gastric bypass   . Nonischemic cardiomyopathy (HCC)    a. 01/2012 Echo: EF 35-40%, Diff HK, mild MR;  b. 01/2012 R&L Heart Cath: mildly elevated R heart pressures (pcwp 17) , nl cors.  . Osteoarthrosis, unspecified whether generalized or localized, ankle and foot    of the knees,ankle and foot  . Pancytopenia   . Persistent atrial fibrillation    a. Pradaxa started 01/2012;  b .s/p TEE/DCCV 02/16/2012; c. recurrent afib->Tikosyn Initiation 03/2012  . Sleep apnea    CPAP compliant  . Status post bypass gastrojejunostomy 01/30/2017  . Stroke (HCC)   . Unspecified intestinal malabsorption   . Urticaria, unspecified   . Vitamin B 12 deficiency     PAST SURGICAL HISTORY: Past Surgical History:  Procedure Laterality Date  . ATRIAL FIBRILLATION ABLATION N/A 06/25/2012   PVI by Dr [name]  . ATRIAL FIBRILLATION ABLATION N/A 10/04/2017   Procedure: ATRIAL FIBRILLATION ABLATION;  Surgeon: Hillis RangeAllred, James, MD;  [facility];  Service: Cardiovascular;  Laterality: N/A;  . CARDIOVERSION  02/16/2012   Procedure: CARDIOVERSION;  Surgeon: Lewayne BuntingBrian S Crenshaw, MD;  [facility];  Service: Cardiovascular;  Laterality: N/A;  . CARDIOVERSION  04/22/2012   Procedure: CARDIOVERSION;  Surgeon: Duke SalviaSteven C Klein, MD;  [facility];  Service: Cardiovascular;  Laterality: N/A;  . CARDIOVERSION N/A 08/22/2017   Procedure: CARDIOVERSION;  Surgeon: Chilton Siandolph, Tiffany, MD;  [facility];  Service: Cardiovascular;  Laterality: N/A;  . CARDIOVERSION N/A 06/11/2018   Procedure: CARDIOVERSION;  Surgeon: Vesta MixerNahser, Philip J, MD;  [facility];  Service: Cardiovascular;  Laterality: N/A;  .  COLONOSCOPY N/A 07/07/2016   Procedure: COLONOSCOPY;  Surgeon: Jeani HawkingPatrick Hung, MD;  Location: [facility] ENDOSCOPY;  Service: Endoscopy;  Laterality: N/A;  . ESOPHAGOGASTRODUODENOSCOPY N/A 07/07/2016   Procedure: ESOPHAGOGASTRODUODENOSCOPY (EGD);  Surgeon: Jeani HawkingPatrick Hung, MD;  [facility];  Service: Endoscopy;  Laterality: N/A;  . GASTRIC BYPASS  2004  . HERNIA REPAIR    . LEFT AND RIGHT HEART CATHETERIZATION WITH CORONARY ANGIOGRAM N/A 02/13/2012   Procedure: LEFT AND RIGHT HEART CATHETERIZATION WITH CORONARY ANGIOGRAM;  Surgeon: Tonny BollmanMichael Cooper, MD;  [facility];  Service: Cardiovascular;  Laterality: N/A;  . TEE WITHOUT CARDIOVERSION  02/16/2012   Procedure: TRANSESOPHAGEAL ECHOCARDIOGRAM (TEE);  Surgeon: Lewayne BuntingBrian S Crenshaw, MD;  [facility];  Service: Cardiovascular;  Laterality: N/A;  10a CV  .  TEE WITHOUT CARDIOVERSION  06/24/2012   Procedure: TRANSESOPHAGEAL ECHOCARDIOGRAM (TEE);  Surgeon: [name], MD;  Location: [facility] ENDOSCOPY;  Service: Cardiovascular;  Laterality: N/A;  . TEE WITHOUT CARDIOVERSION N/A 08/22/2017   Procedure: TRANSESOPHAGEAL ECHOCARDIOGRAM (TEE);  Surgeon: Chilton Si, MD;  Location[facility] ENDOSCOPY;  Service: Cardiovascular;  Laterality: N/A;  . TEE WITHOUT CARDIOVERSION N/A 06/11/2018   Procedure: TRANSESOPHAGEAL ECHOCARDIOGRAM (TEE);  Surgeon: Elease Hashimoto Deloris Ping, MD;  Location[facility] ENDOSCOPY;  Service: Cardiovascular;  Laterality: N/A;  . UMBILICAL HERNIA REPAIR     x 2    FAMILY HISTORY: Family History  Problem Relation Age of Onset  . Hypertension Mother   . Hypertension Father   . Heart attack Father 60       Died  . Diabetes Father   . Kidney disease Father        with transplant  . Stroke Paternal Grandmother     SOCIAL HISTORY: Social History   Socioeconomic History  . Marital status: Married    Spouse name: Not on file  . Number of children: 1  . Years of education: Not on file  . Highest education level: Not on file   Occupational History  . Occupation: Veterinary surgeon  Social Needs  . Financial resource strain: Not on file  . Food insecurity:    Worry: Not on file    Inability: Not on file  . Transportation needs:    Medical: Not on file    Non-medical: Not on file  Tobacco Use  . Smoking status: Never Smoker  . Smokeless tobacco: Former Neurosurgeon    Types: Chew  Substance and Sexual Activity  . Alcohol use: Yes    Comment: occasional  . Drug use: No  . Sexual activity: Yes    Partners: Female  Lifestyle  . Physical activity:    Days per week: Not on file    Minutes per session: Not on file  . Stress: Not on file  Relationships  . Social connections:    Talks on phone: Not on file    Gets together: Not on file    Attends religious service: Not on file    Active member of club or organization: Not on file    Attends meetings of clubs or organizations: Not on file    Relationship status: Not on file  . Intimate partner violence:    Fear of current or ex partner: Not on file    Emotionally abused: Not on file    Physically abused: Not on file    Forced sexual activity: Not on file  Other Topics Concern  . Not on file  Social History Narrative   Lives with wife.  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 Follows all commands speech and language fluent Cranial nerve II-XII: . Extraocular movements were full. Facial symmetry noted head turning and shoulder shrug  were normal and symmetric. Motor: The motor testing reveals 5 over 5 strength of all 4 extremities subjectively per patient Sensory: Sensory testing is intact to soft touch on all 4 extremities subjectively per patient Gait and station: He is able to stand from a seated position..  Reflexes: UTA  DIAGNOSTIC DATA (LABS, IMAGING, TESTING) - I reviewed patient  records, labs, notes, testing and imaging myself where available.  Lab Results  Component  Value Date   WBC 3.7 (L) 06/10/2018   HGB 12.7 (L) 06/10/2018   HCT 39.2 06/10/2018   MCV 99.7 06/10/2018   PLT 137 (L) 06/10/2018      Component Value Date/Time   NA 137 06/10/2018 1448   NA 142 09/27/2017 1144   K 4.0 06/10/2018 1448   CL 105 06/10/2018 1448   CO2 26 06/10/2018 1448   GLUCOSE 126 (H) 06/10/2018 1448   BUN 11 06/10/2018 1448   BUN 11 09/27/2017 1144   CREATININE 0.88 06/10/2018 1448   CREATININE 0.80 04/12/2016 1441   CALCIUM 8.5 (L) 06/10/2018 1448   PROT 6.4 08/14/2017 0936   ALBUMIN 4.0 08/14/2017 0936   AST 25 08/14/2017 0936   ALT 18 08/14/2017 0936   ALKPHOS 77 08/14/2017 0936   BILITOT 0.6 08/14/2017 0936   GFRNONAA >60 06/10/2018 1448   GFRAA >60 06/10/2018 1448   Lab Results  Component Value Date   CHOL 165 06/15/2015   HDL 85 06/15/2015   LDLCALC 71 06/15/2015   TRIG 46 06/15/2015   CHOLHDL 1.9 06/15/2015   Lab Results  Component Value Date   HGBA1C 5.6 05/01/2012   Lab Results  Component Value Date   VITAMINB12 512 08/14/2017   Lab Results  Component Value Date   TSH 2.220 08/14/2017      ASSESSMENT AND PLAN 53 y.o. year old male  has a past medical history of Anemia, iron deficiency (01/30/2017), Cervicalgia, CVA (cerebral infarction) (05/01/12), External thrombosed hemorrhoids, GERD (gastroesophageal reflux disease), Guillain Barr syndrome (HCC), Guillain-Barre syndrome (HCC), Hematuria, Hernia of unspecified site of abdominal cavity without mention of obstruction or gangrene, gastric bypass (01/30/2017), Hypertension, Iron malabsorption (01/30/2017), Morbid obesity s/p gastric bypass, Nonischemic cardiomyopathy (HCC), Osteoarthrosis, unspecified whether generalized or localized, ankle and foot, Pancytopenia, Persistent atrial fibrillation, Sleep apnea, Status post bypass gastrojejunostomy (01/30/2017), Stroke (HCC), Unspecified intestinal  malabsorption, Urticaria, unspecified, and Vitamin B 12 deficiency. here with:  1.  Obstructive sleep apnea on CPAP  The patient CPAP download showed suboptimal compliance but good treatment of his apnea.  I have advised the patient that he can consider setting alarms in order to remind him to put the CPAP on before he goes to bed.  Patient voiced understanding.  He is encouraged to use his CPAP nightly and greater than 4 hours each night.  If his symptoms worsen or he develops new symptoms he should let us know.  He will follow-up in 6 months or sooner if needed.   I spent 15 minutes with the patient this time was spent reviewing his chart prior to the visit, reviewing CPAP download and discussing plan of care with the patient.   Butch Penny, MSN, NP-C 12/05/2018, 1:37 PM [facility] [address] [place], [place] [address] [phone]  I reviewed the above note and documentation by the Nurse Practitioner and agree with the history, assessment and plan as outlined above. I was immediately available for face-to-face consultation. [name], MD, PhD [facility])

## 2018-12-09 ENCOUNTER — Telehealth: Payer: Self-pay | Admitting: Hematology

## 2018-12-09 NOTE — Telephone Encounter (Signed)
[name] cld to reschedule appt to 7/15 at 815am w/labs at 8am d/t COVID-19

## 2018-12-10 ENCOUNTER — Ambulatory Visit (HOSPITAL_COMMUNITY)
Admission: RE | Admit: 2018-12-10 | Payer: BLUE CROSS/BLUE SHIELD | Source: Home / Self Care | Admitting: Internal Medicine

## 2018-12-10 ENCOUNTER — Encounter (HOSPITAL_COMMUNITY): Admission: RE | Payer: Self-pay | Source: Home / Self Care

## 2018-12-10 SURGERY — CARDIOVERSION
Anesthesia: General

## 2018-12-11 ENCOUNTER — Encounter: Payer: BLUE CROSS/BLUE SHIELD | Admitting: Hematology

## 2018-12-12 ENCOUNTER — Other Ambulatory Visit (HOSPITAL_COMMUNITY): Payer: Self-pay | Admitting: *Deleted

## 2018-12-12 DIAGNOSIS — I4819 Other persistent atrial fibrillation: Secondary | ICD-10-CM

## 2018-12-15 DIAGNOSIS — G4733 Obstructive sleep apnea (adult) (pediatric): Secondary | ICD-10-CM | POA: Diagnosis not present

## 2018-12-19 ENCOUNTER — Ambulatory Visit (HOSPITAL_COMMUNITY): Payer: BLUE CROSS/BLUE SHIELD

## 2019-01-13 DIAGNOSIS — Z0000 Encounter for general adult medical examination without abnormal findings: Secondary | ICD-10-CM | POA: Diagnosis not present

## 2019-01-23 DIAGNOSIS — I10 Essential (primary) hypertension: Secondary | ICD-10-CM | POA: Diagnosis not present

## 2019-01-23 DIAGNOSIS — E559 Vitamin D deficiency, unspecified: Secondary | ICD-10-CM | POA: Diagnosis not present

## 2019-01-23 DIAGNOSIS — R946 Abnormal results of thyroid function studies: Secondary | ICD-10-CM | POA: Diagnosis not present

## 2019-01-23 DIAGNOSIS — Z125 Encounter for screening for malignant neoplasm of prostate: Secondary | ICD-10-CM | POA: Diagnosis not present

## 2019-02-04 ENCOUNTER — Other Ambulatory Visit: Payer: Self-pay | Admitting: Hematology

## 2019-02-04 DIAGNOSIS — Z9884 Bariatric surgery status: Secondary | ICD-10-CM

## 2019-02-04 DIAGNOSIS — D508 Other iron deficiency anemias: Secondary | ICD-10-CM

## 2019-02-05 ENCOUNTER — Inpatient Hospital Stay: Payer: BC Managed Care – PPO

## 2019-02-05 ENCOUNTER — Inpatient Hospital Stay: Payer: BC Managed Care – PPO | Attending: Hematology | Admitting: Hematology

## 2019-02-27 ENCOUNTER — Other Ambulatory Visit: Payer: Self-pay

## 2019-02-27 DIAGNOSIS — Z20822 Contact with and (suspected) exposure to covid-19: Secondary | ICD-10-CM

## 2019-02-28 LAB — NOVEL CORONAVIRUS, NAA: SARS-CoV-2, NAA: NOT DETECTED

## 2019-04-07 DIAGNOSIS — H0102B Squamous blepharitis left eye, upper and lower eyelids: Secondary | ICD-10-CM | POA: Diagnosis not present

## 2019-04-07 DIAGNOSIS — H0012 Chalazion right lower eyelid: Secondary | ICD-10-CM | POA: Diagnosis not present

## 2019-04-07 DIAGNOSIS — H2513 Age-related nuclear cataract, bilateral: Secondary | ICD-10-CM | POA: Diagnosis not present

## 2019-04-07 DIAGNOSIS — H0102A Squamous blepharitis right eye, upper and lower eyelids: Secondary | ICD-10-CM | POA: Diagnosis not present

## 2019-04-14 ENCOUNTER — Other Ambulatory Visit: Payer: Self-pay

## 2019-04-14 ENCOUNTER — Encounter (HOSPITAL_COMMUNITY): Payer: Self-pay | Admitting: Nurse Practitioner

## 2019-04-14 ENCOUNTER — Ambulatory Visit (HOSPITAL_COMMUNITY)
Admission: RE | Admit: 2019-04-14 | Discharge: 2019-04-14 | Disposition: A | Discharge status: Home / Self Care | Payer: BC Managed Care – PPO | Source: Ambulatory Visit | Attending: Nurse Practitioner | Admitting: Nurse Practitioner

## 2019-04-14 VITALS — BP 128/88 | HR 118 | Ht 66.0 in | Wt 209.2 lb

## 2019-04-14 DIAGNOSIS — I428 Other cardiomyopathies: Secondary | ICD-10-CM | POA: Diagnosis not present

## 2019-04-14 DIAGNOSIS — D509 Iron deficiency anemia, unspecified: Secondary | ICD-10-CM | POA: Insufficient documentation

## 2019-04-14 DIAGNOSIS — Z6833 Body mass index (BMI) 33.0-33.9, adult: Secondary | ICD-10-CM | POA: Insufficient documentation

## 2019-04-14 DIAGNOSIS — Z9884 Bariatric surgery status: Secondary | ICD-10-CM | POA: Insufficient documentation

## 2019-04-14 DIAGNOSIS — M1990 Unspecified osteoarthritis, unspecified site: Secondary | ICD-10-CM | POA: Insufficient documentation

## 2019-04-14 DIAGNOSIS — I10 Essential (primary) hypertension: Secondary | ICD-10-CM | POA: Insufficient documentation

## 2019-04-14 DIAGNOSIS — I4819 Other persistent atrial fibrillation: Secondary | ICD-10-CM | POA: Diagnosis not present

## 2019-04-14 DIAGNOSIS — Z7901 Long term (current) use of anticoagulants: Secondary | ICD-10-CM | POA: Diagnosis not present

## 2019-04-14 DIAGNOSIS — K219 Gastro-esophageal reflux disease without esophagitis: Secondary | ICD-10-CM | POA: Insufficient documentation

## 2019-04-14 DIAGNOSIS — G4730 Sleep apnea, unspecified: Secondary | ICD-10-CM | POA: Diagnosis not present

## 2019-04-14 DIAGNOSIS — Z8673 Personal history of transient ischemic attack (TIA), and cerebral infarction without residual deficits: Secondary | ICD-10-CM | POA: Insufficient documentation

## 2019-04-14 DIAGNOSIS — E538 Deficiency of other specified B group vitamins: Secondary | ICD-10-CM | POA: Diagnosis not present

## 2019-04-14 DIAGNOSIS — Z79899 Other long term (current) drug therapy: Secondary | ICD-10-CM | POA: Diagnosis not present

## 2019-04-14 MED ORDER — METOPROLOL TARTRATE 25 MG PO TABS: ORAL_TABLET | ORAL | 2 refills | Status: DC

## 2019-04-14 NOTE — H&P (View-Only) (Signed)
Date:  04/14/2019   ID:  Tyrone [name], DOB 1965/09/22, MRN [id number]  Location: home  Provider location: [address] [place], [place] [address] Evaluation Performed:  Add on for afib  PCP:  Maurice Small, MD  Primary Cardiologist: none Primary Electrophysiologist: Dr. Rayann Heman   CC: Persistent afib since  last Wednesday    History of Present Illness: Tyrone [name] is a 53 y.o. male who presents for evaluation for return of afib.  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 His apple watch alerted him.   He usually does not know when he is in afib, other than when his apple watch alerts him.  Hs v rates are int the 100's,  rest in the 90's.  He has has 2 prior ablations and last cardioversion was in November 2019. Last time in afib in May, increase of BB returned him to [place] without cardioversion. He would like to try increase of BB today and then DCCV if not successful. No missed doses of anticoagulation.  Today, he denies symptoms of palpitations, chest pain, shortness of breath, orthopnea, PND, lower extremity edema, claudication, dizziness, presyncope, syncope, bleeding, or neurologic sequela. The patient is tolerating medications without difficulties and is otherwise without complaint today.   he denies symptoms of cough, fevers, chills, or new SOB worrisome for COVID 19.     Atrial Fibrillation Risk Factors:  he does have symptoms or diagnosis of sleep apnea. he is compliant with CPAP therapy. he does not have a history of rheumatic fever. he does not have a history of alcohol use. The patient does not have a history of early familial atrial fibrillation or other arrhythmias.  he has a BMI of Body mass index is 33.77 kg/m.Marland Kitchen Filed Weights   04/14/19 1510  Weight: 94.9 kg    Past Medical History:  Diagnosis Date  . Anemia, iron deficiency 01/30/2017  . Cervicalgia   . CVA (cerebral infarction) 05/01/12   on  pradaxa at time of stroke, switched to [place]  . External thrombosed hemorrhoids   . GERD (gastroesophageal reflux disease)   . Guillain Barr syndrome [facility])    following seasonal influenza vaccination  . Guillain-Barre syndrome ([place])    from stroke in 2013  . Hematuria    a. 01/2012 - to f/u with urology as outpt.  Marland Kitchen Hernia of unspecified site of abdominal cavity without mention of obstruction or gangrene    of abdominal  . Hx of gastric bypass 01/30/2017   2004 Dr Volanda [facility] weight 450 lbs  . Hypertension   . Iron malabsorption 01/30/2017   Hx gastric bypass 2004  . Morbid obesity s/p gastric bypass   . Nonischemic cardiomyopathy ([place])    a. 01/2012 Echo: EF 35-40%, Diff HK, mild MR;  b. 01/2012 R&L Heart Cath: mildly elevated R heart pressures (pcwp 17) , nl cors.  . Osteoarthrosis, unspecified whether generalized or localized, ankle and foot    of the knees,ankle and foot  . Pancytopenia   . Persistent atrial fibrillation    a. Pradaxa started 01/2012;  b .s/p TEE/DCCV 02/16/2012; c. recurrent afib->Tikosyn Initiation 03/2012  . Sleep apnea    CPAP compliant  . Status post bypass gastrojejunostomy 01/30/2017  . Stroke ([place])   . Unspecified intestinal malabsorption   . Urticaria, unspecified   . Vitamin B 12 deficiency    Past Surgical History:  Procedure  Laterality Date  . ATRIAL FIBRILLATION ABLATION N/A 06/25/2012   PVI by Dr Allred  . ATRIAL FIBRILLATION ABLATION N/A 10/04/2017   Procedure: ATRIAL FIBRILLATION ABLATION;  Surgeon: Allred, James, MD;  [facility];  Service: Cardiovascular;  Laterality: N/A;  . CARDIOVERSION  02/16/2012   Procedure: CARDIOVERSION;  Surgeon: Brian S Crenshaw, MD;  Location: [facility] ENDOSCOPY;  Service: Cardiovascular;  Laterality: N/A;  . CARDIOVERSION  04/22/2012   Procedure: CARDIOVERSION;  Surgeon: Steven C Klein, MD;  [facility]: [facility] ENDOSCOPY;  Service: Cardiovascular;  Laterality: N/A;  . CARDIOVERSION N/A 08/22/2017    Procedure: CARDIOVERSION;  Surgeon: [facility], Tiffany, MD;  Location: [facility] ENDOSCOPY;  Service: Cardiovascular;  Laterality: N/A;  . CARDIOVERSION N/A 06/11/2018   Procedure: CARDIOVERSION;  Surgeon: Nahser, Philip J, MD;  Location: [facility] ENDOSCOPY;  Service: Cardiovascular;  Laterality: N/A;  . COLONOSCOPY N/A 07/07/2016   Procedure: COLONOSCOPY;  Surgeon: Patrick Hung, MD;  Location: [facility] ENDOSCOPY;  Service: Endoscopy;  Laterality: N/A;  . ESOPHAGOGASTRODUODENOSCOPY N/A 07/07/2016   Procedure: ESOPHAGOGASTRODUODENOSCOPY (EGD);  Surgeon: Patrick Hung, MD;  Location: [facility] ENDOSCOPY;  Service: Endoscopy;  Laterality: N/A;  . GASTRIC BYPASS  2004  . HERNIA REPAIR    . LEFT AND RIGHT HEART CATHETERIZATION WITH CORONARY ANGIOGRAM N/A 02/13/2012   Procedure: LEFT AND RIGHT HEART CATHETERIZATION WITH CORONARY ANGIOGRAM;  Surgeon: Michael Cooper, MD;  [facility]: [facility] CATH LAB;  Service: Cardiovascular;  Laterality: N/A;  . TEE WITHOUT CARDIOVERSION  02/16/2012   Procedure: TRANSESOPHAGEAL ECHOCARDIOGRAM (TEE);  Surgeon: Brian S Crenshaw, MD;  Location: [facility] ENDOS[facility];  Service: Cardiovascular;  Laterality: N/A;  10a CV  . TEE WITHOUT CARDIOVERSION  06/24/2012   Procedure: TRANSESOPHAGEAL ECHOCARDIOGRAM (TEE);  Surgeon: Peter M Jordan, MD;  Location: [facility]OS[facility];  Service: Cardiovascular;  Laterality: N/A;  . TEE WITHOUT CARDIOVERSION N/A 08/22/2017   Procedure: TRANSESOPHAGEAL ECHOCARDIOGRAM (TEE);  Surgeon: [place], Tiffany, MD;  Location: [facility] ENDOSCOPY;  Service: Cardiovascular;  Laterality: N/A;  . TEE WITHOUT CARDIOVERSION N/A 06/11/2018   Procedure: TRANSESOPHAGEAL ECHOCARDIOGRAM (TEE);  Surgeon: Nahser, Philip J, MD;  Location: [facility] ENDOSCOPY;  Service: Cardiovascular;  Laterality: N/A;  . UMBILICAL HERNIA REPAIR     x 2     Current Outpatient Medications  Medication Sig Dispense Refill  . amLODipine (NORVASC) 5 MG tablet TAKE ONE TABLET BY MOUTH ONCE DAILY (Patient taking differently: TAKE ONE TABLET BY  MOUTH ONCE DAILY IN THE EVENING) 30 tablet 1  . ferrous sulfate 325 (65 FE) MG tablet Take 325 mg by mouth daily with breakfast.    . folic acid (FOLVITE) 1 MG tablet Take 1 tablet (1 mg total) by mouth daily. 100 tablet 3  . furosemide (LASIX) 40 MG tablet Take 20 mg by mouth daily.    . losartan (COZAAR) 100 MG tablet Take 0.5 tablets (50 mg total) by mouth 2 (two) times daily. 90 tablet 3  . metoprolol tartrate (LOPRESSOR) 25 MG tablet Take 100mg in the AM and 75mg in the PM 210 tablet 2  . Multiple Vitamin (MULTIVITAMIN) tablet Take 1-2 tablets by mouth See admin instructions. Take 2 tablets in the morning and 1 tablet in the evening    . omeprazole (PRILOSEC) 40 MG capsule Take 40 mg by mouth daily.   1  . spironolactone (ALDACTONE) 50 MG tablet TAKE ONE TABLET BY MOUTH ONCE DAILY 30 tablet 1  . Vitamin D, Ergocalciferol, (DRISDOL) 1.25 MG (50000 UT) CAPS capsule TAKE 1 CAPSULE BY MOUTH ONCE A WEEK FOR 8 WEEKS    .   vitamin E (VITAMIN E) 400 UNIT capsule Take 400 Units by mouth daily.    [name] Hurl 20 MG TABS tablet TAKE ONE TABLET BY MOUTH DAILY WITH  SUPPER (Patient taking differently: TAKE 20 MG BY MOUTH DAILY WITH  SUPPER) 30 tablet 1   No current facility-administered medications for this encounter.    Facility-Administered Medications Ordered in Other Encounters  Medication Dose Route Frequency Provider Last Rate Last Dose  . gadopentetate dimeglumine (MAGNEVIST) injection 20 mL  20 mL Intravenous Once PRN Anson Fret, MD        Allergies:   Patient has no known allergies.   Social History:  The patient  reports that he has never smoked. He quit smokeless tobacco use about 16 years ago.  His smokeless tobacco use included chew. He reports current alcohol use. He reports that he does not use drugs.   Family History:  The patient's  family history includes Diabetes in his father; Heart attack (age of onset: 36) in his father; Hypertension in his father and mother; Kidney disease  in his father; Stroke in his paternal grandmother.    ROS:  Please see the history of present illness.   All other systems are personally reviewed and negative.   Exam: GEN- The patient is well appearing, alert and oriented x 3 today.   Head- normocephalic, atraumatic Eyes-  Sclera clear, conjunctiva pink Ears- hearing intact Oropharynx- clear Neck- supple, no JVP Lymph- no cervical lymphadenopathy Lungs- Clear to ausculation bilaterally, normal work of breathing Heart- irregular rate and rhythm, no murmurs, rubs or gallops, PMI not laterally displaced GI- soft, NT, ND, + BS Extremities- no clubbing, cyanosis, or edema MS- no significant deformity or atrophy Skin- no rash or lesion Psych- euthymic mood, full affect Neuro- strength and sensation are intact  Recent Labs: 06/10/2018: BUN 11; Creatinine, Ser 0.88; Hemoglobin 12.7; Platelets 137; Potassium 4.0; Sodium 137  personally reviewed    Other studies personally reviewed: Ekg- afib at 118 bpm, qrs int 100 ms, qtc 476 ms    ASSESSMENT AND PLAN:  1. Persistent atrial fibrillation  s/p 2 ablation  Determined in the past not  to be a tikosyn candidate for prolonged qtc. Will increase BB to 100 mg am and 75 mg pm He wants to wait until Thursday of this week and if not has not returned to SR will schedule for cardioversion Continue xarelto 20 mg daily This patients CHA2DS2-VASc Score and unadjusted Ischemic Stroke Rate (% per year) is equal to 2.2 % stroke rate/year from a score of 2 Above score calculated as 1 point each if present [CHF, HTN, DM, Vascular=MI/PAD/Aortic Plaque, Age if 65-74, or Male] Above score calculated as 2 points each if present [Age > 75, or Stroke/TIA/TE] COVID screen The patient does not have any symptoms that suggest any further testing/ screening at this time.  Social distancing reinforced today.    Follow-up:  Per phone call from pt on Thursday   Current medicines are reviewed at length with  the patient today.   The patient has concerns regarding his medicines. Unfortunately, he is not a candidate for antiarrythmic's at this time The following changes were made today:  Increase in BB in am Labs/ tests ordered today include: none No orders of the defined types were placed in this encounter.   Patient Risk:  after full review of this patients clinical status, I feel that they are at moderate risk at this time.  Signed, [name] NP  04/14/2019  3:51 PM  [facility] [address] [place], [place] [address] [phone]

## 2019-04-14 NOTE — Progress Notes (Signed)
Date:  04/14/2019   ID:  Tyrone [name], DOB 1965/09/22, MRN [id number]  Location: home  Provider location: [address] [place], [place] [address] Evaluation Performed:  Add on for afib  PCP:  Maurice Small, MD  Primary Cardiologist: none Primary Electrophysiologist: Dr. Rayann Heman   CC: Persistent afib since  last Wednesday    History of Present Illness: Tyrone [name] is a 53 y.o. male who presents for evaluation for return of afib.  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 His apple watch alerted him.   He usually does not know when he is in afib, other than when his apple watch alerts him.  Hs v rates are int the 100's,  rest in the 90's.  He has has 2 prior ablations and last cardioversion was in November 2019. Last time in afib in May, increase of BB returned him to [place] without cardioversion. He would like to try increase of BB today and then DCCV if not successful. No missed doses of anticoagulation.  Today, he denies symptoms of palpitations, chest pain, shortness of breath, orthopnea, PND, lower extremity edema, claudication, dizziness, presyncope, syncope, bleeding, or neurologic sequela. The patient is tolerating medications without difficulties and is otherwise without complaint today.   he denies symptoms of cough, fevers, chills, or new SOB worrisome for COVID 19.     Atrial Fibrillation Risk Factors:  he does have symptoms or diagnosis of sleep apnea. he is compliant with CPAP therapy. he does not have a history of rheumatic fever. he does not have a history of alcohol use. The patient does not have a history of early familial atrial fibrillation or other arrhythmias.  he has a BMI of Body mass index is 33.77 kg/m.Marland Kitchen Filed Weights   04/14/19 1510  Weight: 94.9 kg    Past Medical History:  Diagnosis Date  . Anemia, iron deficiency 01/30/2017  . Cervicalgia   . CVA (cerebral infarction) 05/01/12   on  pradaxa at time of stroke, switched to [place]  . External thrombosed hemorrhoids   . GERD (gastroesophageal reflux disease)   . Guillain Barr syndrome [facility])    following seasonal influenza vaccination  . Guillain-Barre syndrome ([place])    from stroke in 2013  . Hematuria    a. 01/2012 - to f/u with urology as outpt.  Marland Kitchen Hernia of unspecified site of abdominal cavity without mention of obstruction or gangrene    of abdominal  . Hx of gastric bypass 01/30/2017   2004 Dr Volanda [facility] weight 450 lbs  . Hypertension   . Iron malabsorption 01/30/2017   Hx gastric bypass 2004  . Morbid obesity s/p gastric bypass   . Nonischemic cardiomyopathy ([place])    a. 01/2012 Echo: EF 35-40%, Diff HK, mild MR;  b. 01/2012 R&L Heart Cath: mildly elevated R heart pressures (pcwp 17) , nl cors.  . Osteoarthrosis, unspecified whether generalized or localized, ankle and foot    of the knees,ankle and foot  . Pancytopenia   . Persistent atrial fibrillation    a. Pradaxa started 01/2012;  b .s/p TEE/DCCV 02/16/2012; c. recurrent afib->Tikosyn Initiation 03/2012  . Sleep apnea    CPAP compliant  . Status post bypass gastrojejunostomy 01/30/2017  . Stroke ([place])   . Unspecified intestinal malabsorption   . Urticaria, unspecified   . Vitamin B 12 deficiency    Past Surgical History:  Procedure  Laterality Date  . ATRIAL FIBRILLATION ABLATION N/A 06/25/2012   PVI by Dr [name]  . ATRIAL FIBRILLATION ABLATION N/A 10/04/2017   Procedure: ATRIAL FIBRILLATION ABLATION;  Surgeon: Hillis RangeAllred, James, MD;  [facility]: [facility] CV LAB;  Service: Cardiovascular;  Laterality: N/A;  . CARDIOVERSION  02/16/2012   Procedure: CARDIOVERSION;  Surgeon: Lewayne BuntingBrian S Crenshaw, MD;  [facility];  Service: Cardiovascular;  Laterality: N/A;  . CARDIOVERSION  04/22/2012   Procedure: CARDIOVERSION;  Surgeon: Duke SalviaSteven C Klein, MD;  [facility];  Service: Cardiovascular;  Laterality: N/A;  . CARDIOVERSION N/A 08/22/2017    Procedure: CARDIOVERSION;  Surgeon: Chilton Siandolph, Tiffany, MD;  [facility];  Service: Cardiovascular;  Laterality: N/A;  . CARDIOVERSION N/A 06/11/2018   Procedure: CARDIOVERSION;  Surgeon: Vesta MixerNahser, Philip J, MD;  [facility];  Service: Cardiovascular;  Laterality: N/A;  . COLONOSCOPY N/A 07/07/2016   Procedure: COLONOSCOPY;  Surgeon: Jeani HawkingPatrick Hung, MD;  Location: [facility] ENDOSCOPY;  Service: Endoscopy;  Laterality: N/A;  . ESOPHAGOGASTRODUODENOSCOPY N/A 07/07/2016   Procedure: ESOPHAGOGASTRODUODENOSCOPY (EGD);  Surgeon: Jeani HawkingPatrick Hung, MD;  [facility];  Service: Endoscopy;  Laterality: N/A;  . GASTRIC BYPASS  2004  . HERNIA REPAIR    . LEFT AND RIGHT HEART CATHETERIZATION WITH CORONARY ANGIOGRAM N/A 02/13/2012   Procedure: LEFT AND RIGHT HEART CATHETERIZATION WITH CORONARY ANGIOGRAM;  Surgeon: Tonny BollmanMichael Cooper, MD;  [facility];  Service: Cardiovascular;  Laterality: N/A;  . TEE WITHOUT CARDIOVERSION  02/16/2012   Procedure: TRANSESOPHAGEAL ECHOCARDIOGRAM (TEE);  Surgeon: Lewayne BuntingBrian S Crenshaw, MD;  [facility];  Service: Cardiovascular;  Laterality: N/A;  10a CV  . TEE WITHOUT CARDIOVERSION  06/24/2012   Procedure: TRANSESOPHAGEAL ECHOCARDIOGRAM (TEE);  Surgeon: Peter M SwazilandJordan, MD;  [facility];  Service: Cardiovascular;  Laterality: N/A;  . TEE WITHOUT CARDIOVERSION N/A 08/22/2017   Procedure: TRANSESOPHAGEAL ECHOCARDIOGRAM (TEE);  Surgeon: Chilton Siandolph, Tiffany, MD;  [facility];  Service: Cardiovascular;  Laterality: N/A;  . TEE WITHOUT CARDIOVERSION N/A 06/11/2018   Procedure: TRANSESOPHAGEAL ECHOCARDIOGRAM (TEE);  Surgeon: Elease HashimotoNahser, Deloris PingPhilip J, MD;  [facility];  Service: Cardiovascular;  Laterality: N/A;  . UMBILICAL HERNIA REPAIR     x 2     Current Outpatient Medications  Medication Sig Dispense Refill  . amLODipine (NORVASC) 5 MG tablet TAKE ONE TABLET BY MOUTH ONCE DAILY (Patient taking differently: TAKE ONE TABLET BY  MOUTH ONCE DAILY IN THE EVENING) 30 tablet 1  . ferrous sulfate 325 (65 FE) MG tablet Take 325 mg by mouth daily with breakfast.    . folic acid (FOLVITE) 1 MG tablet Take 1 tablet (1 mg total) by mouth daily. 100 tablet 3  . furosemide (LASIX) 40 MG tablet Take 20 mg by mouth daily.    Marland Kitchen. losartan (COZAAR) 100 MG tablet Take 0.5 tablets (50 mg total) by mouth 2 (two) times daily. 90 tablet 3  . metoprolol tartrate (LOPRESSOR) 25 MG tablet Take 100mg  in the AM and 75mg  in the PM 210 tablet 2  . Multiple Vitamin (MULTIVITAMIN) tablet Take 1-2 tablets by mouth See admin instructions. Take 2 tablets in the morning and 1 tablet in the evening    . omeprazole (PRILOSEC) 40 MG capsule Take 40 mg by mouth daily.   1  . spironolactone (ALDACTONE) 50 MG tablet TAKE ONE TABLET BY MOUTH ONCE DAILY 30 tablet 1  . Vitamin D, Ergocalciferol, (DRISDOL) 1.25 MG (50000 UT) CAPS capsule TAKE 1 CAPSULE BY MOUTH ONCE A WEEK FOR 8 WEEKS    .  vitamin E (VITAMIN E) 400 UNIT capsule Take 400 Units by mouth daily.    [name] Hurl 20 MG TABS tablet TAKE ONE TABLET BY MOUTH DAILY WITH  SUPPER (Patient taking differently: TAKE 20 MG BY MOUTH DAILY WITH  SUPPER) 30 tablet 1   No current facility-administered medications for this encounter.    Facility-Administered Medications Ordered in Other Encounters  Medication Dose Route Frequency Provider Last Rate Last Dose  . gadopentetate dimeglumine (MAGNEVIST) injection 20 mL  20 mL Intravenous Once PRN Anson Fret, MD        Allergies:   Patient has no known allergies.   Social History:  The patient  reports that he has never smoked. He quit smokeless tobacco use about 16 years ago.  His smokeless tobacco use included chew. He reports current alcohol use. He reports that he does not use drugs.   Family History:  The patient's  family history includes Diabetes in his father; Heart attack (age of onset: 36) in his father; Hypertension in his father and mother; Kidney disease  in his father; Stroke in his paternal grandmother.    ROS:  Please see the history of present illness.   All other systems are personally reviewed and negative.   Exam: GEN- The patient is well appearing, alert and oriented x 3 today.   Head- normocephalic, atraumatic Eyes-  Sclera clear, conjunctiva pink Ears- hearing intact Oropharynx- clear Neck- supple, no JVP Lymph- no cervical lymphadenopathy Lungs- Clear to ausculation bilaterally, normal work of breathing Heart- irregular rate and rhythm, no murmurs, rubs or gallops, PMI not laterally displaced GI- soft, NT, ND, + BS Extremities- no clubbing, cyanosis, or edema MS- no significant deformity or atrophy Skin- no rash or lesion Psych- euthymic mood, full affect Neuro- strength and sensation are intact  Recent Labs: 06/10/2018: BUN 11; Creatinine, Ser 0.88; Hemoglobin 12.7; Platelets 137; Potassium 4.0; Sodium 137  personally reviewed    Other studies personally reviewed: Ekg- afib at 118 bpm, qrs int 100 ms, qtc 476 ms    ASSESSMENT AND PLAN:  1. Persistent atrial fibrillation  s/p 2 ablation  Determined in the past not  to be a tikosyn candidate for prolonged qtc. Will increase BB to 100 mg am and 75 mg pm He wants to wait until Thursday of this week and if not has not returned to SR will schedule for cardioversion Continue xarelto 20 mg daily This patients CHA2DS2-VASc Score and unadjusted Ischemic Stroke Rate (% per year) is equal to 2.2 % stroke rate/year from a score of 2 Above score calculated as 1 point each if present [CHF, HTN, DM, Vascular=MI/PAD/Aortic Plaque, Age if 65-74, or Male] Above score calculated as 2 points each if present [Age > 75, or Stroke/TIA/TE] COVID screen The patient does not have any symptoms that suggest any further testing/ screening at this time.  Social distancing reinforced today.    Follow-up:  Per phone call from pt on Thursday   Current medicines are reviewed at length with  the patient today.   The patient has concerns regarding his medicines. Unfortunately, he is not a candidate for antiarrythmic's at this time The following changes were made today:  Increase in BB in am Labs/ tests ordered today include: none No orders of the defined types were placed in this encounter.   Patient Risk:  after full review of this patients clinical status, I feel that they are at moderate risk at this time.  Signed, [name] NP  04/14/2019  3:51 PM  Afib Clinic [facility] [address] [place], [place] [address] [phone]

## 2019-04-14 NOTE — Patient Instructions (Signed)
Increase metoprolol to 100mg  in the AM and 75mg  in the PM  Call Thursday with report

## 2019-04-18 ENCOUNTER — Telehealth (HOSPITAL_COMMUNITY): Payer: Self-pay | Admitting: *Deleted

## 2019-04-18 ENCOUNTER — Other Ambulatory Visit (HOSPITAL_COMMUNITY): Payer: Self-pay | Admitting: *Deleted

## 2019-04-18 DIAGNOSIS — I4819 Other persistent atrial fibrillation: Secondary | ICD-10-CM

## 2019-04-18 NOTE — Telephone Encounter (Signed)
Patient continues in persistent AF - scheduled for dccv on 10/6 per pt request. covid test set for 10/2 at [facility] - will stop at the lab at [facility] prior to having covid testing done. NPO after MN - sip of water with morning medications. Reminded not to miss any doses of Xarelto prior to dccv. Pt to see Dr. Rayann Heman post dccv for follow up. Pt verbalized understanding.

## 2019-04-21 ENCOUNTER — Telehealth: Payer: Self-pay | Admitting: Internal Medicine

## 2019-04-21 NOTE — Telephone Encounter (Signed)
New message    Pt wanted to inform RN of BP readings.  91/75, 85/68, 100/70-readings from this morning around 9:30-10. HR has been any where from 112-125 the last few days.  Pt states he has had some lightheadedness. Pt wife says that pt isnt one to complain but has felt the worst today than he has the last couple days. His wife says that his sister is a Marine scientist and they had her check his BP manually and when she checked him there was some wheezing and he has had some SOB. Not gasping for air.

## 2019-04-21 NOTE — Telephone Encounter (Signed)
Talked with patient - more short of breath today BP lower today - per Adline Peals  PA will hold losartan until back to his normal dosing of metoprolol. If shortness of breath does not improve after BP normalizes he will call me back. Pt denies orthopnea, lower extremity swelling or abdominal bloating. Pt does not sound short of breath or in distress over the phone. Pt will call back if further issues before cardioversion.

## 2019-04-25 ENCOUNTER — Other Ambulatory Visit: Payer: Self-pay

## 2019-04-25 ENCOUNTER — Other Ambulatory Visit (HOSPITAL_COMMUNITY)
Admission: RE | Admit: 2019-04-25 | Discharge: 2019-04-25 | Disposition: A | Discharge status: Home / Self Care | Payer: BC Managed Care – PPO | Source: Ambulatory Visit | Attending: Cardiovascular Disease | Admitting: Cardiovascular Disease

## 2019-04-25 ENCOUNTER — Other Ambulatory Visit (HOSPITAL_COMMUNITY)
Admission: RE | Admit: 2019-04-25 | Discharge: 2019-04-25 | Disposition: A | Discharge status: Home / Self Care | Payer: BC Managed Care – PPO | Source: Ambulatory Visit | Attending: Nurse Practitioner | Admitting: Nurse Practitioner

## 2019-04-25 DIAGNOSIS — Z20828 Contact with and (suspected) exposure to other viral communicable diseases: Secondary | ICD-10-CM | POA: Insufficient documentation

## 2019-04-25 DIAGNOSIS — Z01812 Encounter for preprocedural laboratory examination: Secondary | ICD-10-CM | POA: Insufficient documentation

## 2019-04-25 LAB — CBC
HCT: 38.3 % — ABNORMAL LOW (ref 39.0–52.0)
Hemoglobin: 12.3 g/dL — ABNORMAL LOW (ref 13.0–17.0)
MCH: 33.8 pg (ref 26.0–34.0)
MCHC: 32.1 g/dL (ref 30.0–36.0)
MCV: 105.2 fL — ABNORMAL HIGH (ref 80.0–100.0)
Platelets: 157 10*3/uL (ref 150–400)
RBC: 3.64 MIL/uL — ABNORMAL LOW (ref 4.22–5.81)
RDW: 13.3 % (ref 11.5–15.5)
WBC: 4.6 10*3/uL (ref 4.0–10.5)
nRBC: 0 % (ref 0.0–0.2)

## 2019-04-25 LAB — BASIC METABOLIC PANEL
Anion gap: 6 (ref 5–15)
BUN: 13 mg/dL (ref 6–20)
CO2: 25 mmol/L (ref 22–32)
Calcium: 8.2 mg/dL — ABNORMAL LOW (ref 8.9–10.3)
Chloride: 107 mmol/L (ref 98–111)
Creatinine, Ser: 0.76 mg/dL (ref 0.61–1.24)
GFR calc Af Amer: >60 mL/min (ref >60)
GFR calc non Af Amer: >60 mL/min (ref >60)
Glucose, Bld: 120 mg/dL — ABNORMAL HIGH (ref 70–99)
Potassium: 4.9 mmol/L (ref 3.5–5.1)
Sodium: 138 mmol/L (ref 135–145)

## 2019-04-26 LAB — SARS CORONAVIRUS 2 (TAT 6-24 HRS): SARS Coronavirus 2: NEGATIVE

## 2019-04-29 ENCOUNTER — Ambulatory Visit (HOSPITAL_COMMUNITY)
Admission: RE | Admit: 2019-04-29 | Discharge: 2019-04-29 | Disposition: A | Discharge status: Home / Self Care | Payer: BC Managed Care – PPO | Attending: Cardiovascular Disease | Admitting: Cardiovascular Disease

## 2019-04-29 ENCOUNTER — Other Ambulatory Visit: Payer: Self-pay

## 2019-04-29 ENCOUNTER — Ambulatory Visit (HOSPITAL_COMMUNITY): Payer: BC Managed Care – PPO | Admitting: Certified Registered Nurse Anesthetist

## 2019-04-29 ENCOUNTER — Encounter (HOSPITAL_COMMUNITY)
Admission: RE | Disposition: A | Discharge status: Home / Self Care | Payer: Self-pay | Source: Home / Self Care | Attending: Cardiovascular Disease

## 2019-04-29 ENCOUNTER — Encounter (HOSPITAL_COMMUNITY): Payer: Self-pay | Admitting: *Deleted

## 2019-04-29 DIAGNOSIS — Z9884 Bariatric surgery status: Secondary | ICD-10-CM | POA: Insufficient documentation

## 2019-04-29 DIAGNOSIS — Z8673 Personal history of transient ischemic attack (TIA), and cerebral infarction without residual deficits: Secondary | ICD-10-CM | POA: Diagnosis not present

## 2019-04-29 DIAGNOSIS — K219 Gastro-esophageal reflux disease without esophagitis: Secondary | ICD-10-CM | POA: Diagnosis not present

## 2019-04-29 DIAGNOSIS — I4891 Unspecified atrial fibrillation: Secondary | ICD-10-CM | POA: Diagnosis not present

## 2019-04-29 DIAGNOSIS — Z7901 Long term (current) use of anticoagulants: Secondary | ICD-10-CM | POA: Diagnosis not present

## 2019-04-29 DIAGNOSIS — Z8249 Family history of ischemic heart disease and other diseases of the circulatory system: Secondary | ICD-10-CM | POA: Insufficient documentation

## 2019-04-29 DIAGNOSIS — Z6833 Body mass index (BMI) 33.0-33.9, adult: Secondary | ICD-10-CM | POA: Diagnosis not present

## 2019-04-29 DIAGNOSIS — I10 Essential (primary) hypertension: Secondary | ICD-10-CM | POA: Insufficient documentation

## 2019-04-29 DIAGNOSIS — Z79899 Other long term (current) drug therapy: Secondary | ICD-10-CM | POA: Diagnosis not present

## 2019-04-29 DIAGNOSIS — G4730 Sleep apnea, unspecified: Secondary | ICD-10-CM | POA: Diagnosis not present

## 2019-04-29 DIAGNOSIS — D509 Iron deficiency anemia, unspecified: Secondary | ICD-10-CM | POA: Diagnosis not present

## 2019-04-29 DIAGNOSIS — I4819 Other persistent atrial fibrillation: Secondary | ICD-10-CM | POA: Diagnosis not present

## 2019-04-29 DIAGNOSIS — I110 Hypertensive heart disease with heart failure: Secondary | ICD-10-CM | POA: Diagnosis not present

## 2019-04-29 DIAGNOSIS — I5042 Chronic combined systolic (congestive) and diastolic (congestive) heart failure: Secondary | ICD-10-CM | POA: Diagnosis not present

## 2019-04-29 HISTORY — PX: CARDIOVERSION: SHX1299

## 2019-04-29 SURGERY — CARDIOVERSION
Anesthesia: General

## 2019-04-29 MED ORDER — SODIUM CHLORIDE 0.9 % IV SOLN
INTRAVENOUS | Status: DC
  Administered 2019-04-29: 12:00:00 via INTRAVENOUS

## 2019-04-29 MED ORDER — PROPOFOL 10 MG/ML IV BOLUS
INTRAVENOUS | Status: DC | PRN
  Administered 2019-04-29 (×2): 50 mg via INTRAVENOUS

## 2019-04-29 MED ORDER — LIDOCAINE 2% (20 MG/ML) 5 ML SYRINGE
INTRAMUSCULAR | Status: DC | PRN
  Administered 2019-04-29: 100 mg via INTRAVENOUS

## 2019-04-29 NOTE — Anesthesia Postprocedure Evaluation (Signed)
Anesthesia Post Note  Patient: Tyrone [name]  Procedure(s) Performed: CARDIOVERSION (N/A )     Patient location during evaluation: PACU Anesthesia Type: General Level of consciousness: awake and alert Pain management: pain level controlled Vital Signs Assessment: post-procedure vital signs reviewed and stable Respiratory status: spontaneous breathing, nonlabored ventilation, respiratory function stable and patient connected to nasal cannula oxygen Cardiovascular status: blood pressure returned to baseline and stable Postop Assessment: no apparent nausea or vomiting Anesthetic complications: no    Last Vitals:  Vitals:   04/29/19 1335 04/29/19 1345  BP: (!) 128/93 (!) 141/93  Pulse: 63 68  Resp: 18 20  Temp:    SpO2: 97% 98%    Last Pain:  Vitals:   04/29/19 1324  TempSrc: Oral  PainSc: 0-No pain                 Anthonia Monger DAVID

## 2019-04-29 NOTE — Transfer of Care (Signed)
Immediate Anesthesia Transfer of Care Note  Patient: Tyrone [name]  Procedure(s) Performed: CARDIOVERSION (N/A )  Patient Location: PACU  Anesthesia Type:General  Level of Consciousness: awake, patient cooperative and responds to stimulation  Airway & Oxygen Therapy: Patient Spontanous Breathing  Post-op Assessment: Report given to RN and Post -op Vital signs reviewed and stable  Post vital signs: Reviewed and stable  Last Vitals:  Vitals Value Taken Time  BP    Temp    Pulse    Resp    SpO2      Last Pain:  Vitals:   04/29/19 1220  TempSrc: Temporal  PainSc: 0-No pain         Complications: No apparent anesthesia complications

## 2019-04-29 NOTE — Discharge Instructions (Signed)

## 2019-04-29 NOTE — Anesthesia Preprocedure Evaluation (Signed)
Anesthesia Evaluation  Patient identified by MRN, date of birth, ID band Patient awake    Reviewed: Allergy & Precautions, NPO status , Patient's Chart, lab work & pertinent test results  Airway Mallampati: I  TM Distance: >3 FB Neck ROM: Full    Dental   Pulmonary    Pulmonary exam normal        Cardiovascular hypertension, Pt. on medications Normal cardiovascular exam     Neuro/Psych    GI/Hepatic GERD  Medicated and Controlled,  Endo/Other    Renal/GU      Musculoskeletal   Abdominal   Peds  Hematology   Anesthesia Other Findings   Reproductive/Obstetrics                             Anesthesia Physical Anesthesia Plan  ASA: III  Anesthesia Plan: General   Post-op Pain Management:    Induction: Intravenous  PONV Risk Score and Plan: 2 and Treatment may vary due to age or medical condition  Airway Management Planned: Mask  Additional Equipment:   Intra-op Plan:   Post-operative Plan:   Informed Consent: I have reviewed the patients History and Physical, chart, labs and discussed the procedure including the risks, benefits and alternatives for the proposed anesthesia with the patient or authorized representative who has indicated his/her understanding and acceptance.       Plan Discussed with: CRNA and Surgeon  Anesthesia Plan Comments:         Anesthesia Quick Evaluation

## 2019-04-29 NOTE — CV Procedure (Signed)
   DIRECT CURRENT CARDIOVERSION  NAME:  Tyrone [name]    MRN: [id number] DOB:  August 04, 1965    ADMIT DATE: 04/29/2019  Indication:  Symptomatic atrial fibrillation  Procedure Note:  The patient signed informed consent.  They have had had therapeutic anticoagulation with xarelto greater than 3 weeks.  Anesthesia was administered by Dr. Ailene Ards.  Adequate airway was maintained throughout and vital followed per protocol.  They were cardioverted x 1 with 200J of biphasic synchronized energy.  They converted to NSR.  There were no apparent complications.  The patient had normal neuro status and respiratory status post procedure with vitals stable as recorded elsewhere.    Follow up: They will continue on current medical therapy and follow up with cardiology as scheduled.  [name] Audie Box, [place]  [address] [place], [place] [address] [phone]  1:20 PM

## 2019-04-29 NOTE — Interval H&P Note (Signed)
History and Physical Interval Note:  04/29/2019 12:34 PM  Tyrone [name]  has presented today for surgery, with the diagnosis of AFIB.  The various methods of treatment have been discussed with the patient and family. After consideration of risks, benefits and other options for treatment, the patient has consented to  Procedure(s): CARDIOVERSION (N/A) as a surgical intervention.  The patient's history has been reviewed, patient examined, no change in status, stable for surgery.  I have reviewed the patient's chart and labs.  Questions were answered to the patient's satisfaction.    [name] Audie Box, [place]  [address] [place], [place] [address] [phone]  12:35 PM

## 2019-04-30 ENCOUNTER — Encounter (HOSPITAL_COMMUNITY): Payer: Self-pay | Admitting: Cardiovascular Disease

## 2019-05-29 ENCOUNTER — Other Ambulatory Visit: Payer: Self-pay

## 2019-05-29 ENCOUNTER — Ambulatory Visit: Payer: BC Managed Care – PPO | Admitting: Internal Medicine

## 2019-05-29 ENCOUNTER — Encounter: Payer: Self-pay | Admitting: Internal Medicine

## 2019-05-29 VITALS — BP 136/88 | HR 109 | Ht 66.0 in | Wt 215.2 lb

## 2019-05-29 DIAGNOSIS — I4819 Other persistent atrial fibrillation: Secondary | ICD-10-CM

## 2019-05-29 DIAGNOSIS — I10 Essential (primary) hypertension: Secondary | ICD-10-CM

## 2019-05-29 DIAGNOSIS — I428 Other cardiomyopathies: Secondary | ICD-10-CM | POA: Diagnosis not present

## 2019-05-29 MED ORDER — FLECAINIDE ACETATE 50 MG PO TABS: 50.0000 mg | ORAL_TABLET | Freq: Two times a day (BID) | ORAL | 3 refills | Status: DC

## 2019-05-29 NOTE — Patient Instructions (Addendum)
Medication Instructions:   Your physician has recommended you make the following change in your medication:   1.  Start taking flecainide 50 mg--One tablet by mouth twice a day  Labwork: None ordered.  Testing/Procedures: None ordered.  Follow-Up:  You will have an appointment on Monday with afib clinic.  Your physician wants you to follow-up in: 6 weeks with Dr. [name].      Any Other Special Instructions Will Be Listed Below (If Applicable).  If you need a refill on your cardiac medications before your next appointment, please call your pharmacy.   Flecainide tablets What is this medicine? FLECAINIDE (FLEK a nide) is an antiarrhythmic drug. This medicine is used to prevent irregular heart rhythm. It can also slow down fast heartbeats called tachycardia. This medicine may be used for other purposes; ask your health care provider or pharmacist if you have questions. COMMON BRAND NAME(S): Tambocor What should I tell my health care provider before I take this medicine? They need to know if you have any of these conditions:  abnormal levels of potassium in the blood  heart disease including heart rhythm and heart rate problems  kidney or liver disease  recent heart attack  an unusual or allergic reaction to flecainide, local anesthetics, other medicines, foods, dyes, or preservatives  pregnant or trying to get pregnant  breast-feeding How should I use this medicine? Take this medicine by mouth with a glass of water. Follow the directions on the prescription label. You can take this medicine with or without food. Take your doses at regular intervals. Do not take your medicine more often than directed. Do not stop taking this medicine suddenly. This may cause serious, heart-related side effects. If your doctor wants you to stop the medicine, the dose may be slowly lowered over time to avoid any side effects. Talk to your pediatrician regarding the use of this medicine in  children. While this drug may be prescribed for children as young as 1 year of age for selected conditions, precautions do apply. Overdosage: If you think you have taken too much of this medicine contact a poison control center or emergency room at once. NOTE: This medicine is only for you. Do not share this medicine with others. What if I miss a dose? If you miss a dose, take it as soon as you can. If it is almost time for your next dose, take only that dose. Do not take double or extra doses. What may interact with this medicine? Do not take this medicine with any of the following medications:  amoxapine  arsenic trioxide  certain antibiotics like clarithromycin, erythromycin, gatifloxacin, gemifloxacin, levofloxacin, moxifloxacin, sparfloxacin, or troleandomycin  certain antidepressants called tricyclic antidepressants like amitriptyline, imipramine, or nortriptyline  certain medicines to control heart rhythm like disopyramide, encainide, moricizine, procainamide, propafenone, and quinidine  cisapride  delavirdine  droperidol  haloperidol  haw[place]orn  imatinib  levomethadyl  maprotiline  medicines for malaria like chloroquine and halofantrine  pentamidine  phenothiazines like chlorpromazine, mesoridazine, prochlorperazine, thioridazine  pimozide  quinine  ranolazine  ritonavir  sertindole This medicine may also interact with the following medications:  cimetidine  dofetilide  medicines for angina or high blood pressure  medicines to control heart rhythm like amiodarone and digoxin  ziprasidone This list may not describe all possible interactions. Give your health care provider a list of all the medicines, herbs, non-prescription drugs, or dietary supplements you use. Also tell them if you smoke, drink alcohol, or use illegal drugs. Some items  may interact with your medicine. What should I watch for while using this medicine? Visit your doctor or health  care professional for regular checks on your progress. Because your condition and the use of this medicine carries some risk, it is a good idea to carry an identification card, necklace or bracelet with details of your condition, medications and doctor or health care professional. Check your blood pressure and pulse rate regularly. Ask your health care professional what your blood pressure and pulse rate should be, and when you should contact him or her. Your doctor or health care professional also may schedule regular blood tests and electrocardiograms to check your progress. You may get drowsy or dizzy. Do not drive, use machinery, or do anything that needs mental alertness until you know how this medicine affects you. Do not stand or sit up quickly, especially if you are an older patient. This reduces the risk of dizzy or fainting spells. Alcohol can make you more dizzy, increase flushing and rapid heartbeats. Avoid alcoholic drinks. What side effects may I notice from receiving this medicine? Side effects that you should report to your doctor or health care professional as soon as possible:  chest pain, continued irregular heartbeats  difficulty breathing  swelling of the legs or feet  trembling, shaking  unusually weak or tired Side effects that usually do not require medical attention (report to your doctor or health care professional if they continue or are bothersome):  blurred vision  constipation  headache  nausea, vomiting  stomach pain This list may not describe all possible side effects. Call your doctor for medical advice about side effects. You may report side effects to FDA at 1-800-FDA-1088. Where should I keep my medicine? Keep out of the reach of children. Store at room temperature between 15 and 30 degrees C (59 and 86 degrees F). Protect from light. Keep container tightly closed. Throw away any unused medicine after the expiration date. NOTE: This sheet is a  summary. It may not cover all possible information. If you have questions about this medicine, talk to your doctor, pharmacist, or health care provider.  2020 Elsevier/Gold Standard (2018-07-01 11:41:38)

## 2019-05-29 NOTE — Progress Notes (Signed)
PCP: Maurice Small, MD CC: afib Primary EP: Dr Sabino Gasser III is a 53 y.o. male who presents today for routine electrophysiology followup.  Since last being seen in our clinic, the patient reports doing reasonably well.  Unfortunately, he continues to have afib.  He required recent cardioversion.  He was in sinus until coming to the office today.  He reports return of tachypalpitations today.  Today, he denies symptoms of   chest pain, shortness of breath,  lower extremity edema, dizziness, presyncope, or syncope.  The patient is otherwise without complaint today.   Past Medical History:  Diagnosis Date  . Anemia, iron deficiency 01/30/2017  . Cervicalgia   . CVA (cerebral infarction) 05/01/12   on pradaxa at time of stroke, switched to [place]  . External thrombosed hemorrhoids   . GERD (gastroesophageal reflux disease)   . Guillain Barr syndrome [facility])    following seasonal influenza vaccination  . Guillain-Barre syndrome ([place])    from stroke in 2013  . Hematuria    a. 01/2012 - to f/u with urology as outpt.  Marland Kitchen Hernia of unspecified site of abdominal cavity without mention of obstruction or gangrene    of abdominal  . Hx of gastric bypass 01/30/2017   2004 Dr Volanda [facility] weight 450 lbs  . Hypertension   . Iron malabsorption 01/30/2017   Hx gastric bypass 2004  . Morbid obesity s/p gastric bypass   . Nonischemic cardiomyopathy ([place])    a. 01/2012 Echo: EF 35-40%, Diff HK, mild MR;  b. 01/2012 R&L Heart Cath: mildly elevated R heart pressures (pcwp 17) , nl cors.  . Osteoarthrosis, unspecified whether generalized or localized, ankle and foot    of the knees,ankle and foot  . Pancytopenia   . Persistent atrial fibrillation ([place])    a. Pradaxa started 01/2012;  b .s/p TEE/DCCV 02/16/2012; c. recurrent afib->Tikosyn Initiation 03/2012  . Sleep apnea    CPAP compliant  . Status post bypass gastrojejunostomy 01/30/2017  . Stroke ([place])   . Unspecified intestinal  malabsorption   . Urticaria, unspecified   . Vitamin B 12 deficiency    Past Surgical History:  Procedure Laterality Date  . ATRIAL FIBRILLATION ABLATION N/A 06/25/2012   PVI by Dr Rayann Heman  . ATRIAL FIBRILLATION ABLATION N/A 10/04/2017   Procedure: ATRIAL FIBRILLATION ABLATION;  Surgeon: Thompson Grayer, MD;  Location: [place] CV LAB;  Service: Cardiovascular;  Laterality: N/A;  . CARDIOVERSION  02/16/2012   Procedure: CARDIOVERSION;  Surgeon: Lelon Perla, MD;  Location: [facility] ENDOSCOPY;  Service: Cardiovascular;  Laterality: N/A;  . CARDIOVERSION  04/22/2012   Procedure: CARDIOVERSION;  Surgeon: Deboraha Sprang, MD;  Location: [facility] ENDOSCOPY;  Service: Cardiovascular;  Laterality: N/A;  . CARDIOVERSION N/A 08/22/2017   Procedure: CARDIOVERSION;  Surgeon: Skeet Latch, MD;  Location: [facility] ENDOSCOPY;  Service: Cardiovascular;  Laterality: N/A;  . CARDIOVERSION N/A 06/11/2018   Procedure: CARDIOVERSION;  Surgeon: Thayer Headings, MD;  Location: [facility] ENDOSCOPY;  Service: Cardiovascular;  Laterality: N/A;  . CARDIOVERSION N/A 04/29/2019   Procedure: CARDIOVERSION;  Surgeon: Geralynn Rile, MD;  Location: [place];  Service: Endoscopy;  Laterality: N/A;  . COLONOSCOPY N/A 07/07/2016   Procedure: COLONOSCOPY;  Surgeon: Carol Ada, MD;  Location: [facility] ENDOSCOPY;  Service: Endoscopy;  Laterality: N/A;  . ESOPHAGOGASTRODUODENOSCOPY N/A 07/07/2016   Procedure: ESOPHAGOGASTRODUODENOSCOPY (EGD);  Surgeon: Carol Ada, MD;  Location: [facility] ENDOSCOPY;  Service: Endoscopy;  Laterality: N/A;  . GASTRIC BYPASS  2004  .  HERNIA REPAIR    . LEFT AND RIGHT HEART CATHETERIZATION WITH CORONARY ANGIOGRAM N/A 02/13/2012   Procedure: LEFT AND RIGHT HEART CATHETERIZATION WITH CORONARY ANGIOGRAM;  Surgeon: Tonny Bollman, MD;  Location: [facility] CATH LAB;  Service: Cardiovascular;  Laterality: N/A;  . TEE WITHOUT CARDIOVERSION  02/16/2012   Procedure: TRANSESOPHAGEAL ECHOCARDIOGRAM (TEE);  Surgeon: [name],  MD;  Location: [facility] ENDOSCOPY;  Service: Cardiovascular;  Laterality: N/A;  10a CV  . TEE WITHOUT CARDIOVERSION  06/24/2012   Procedure: TRANSESOPHAGEAL ECHOCARDIOGRAM (TEE);  Surgeon: [name], MD;  Location: [facility] ENDOSCOPY;  Service: Cardiovascular;  Laterality: N/A;  . TEE WITHOUT CARDIOVERSION N/A 08/22/2017   Procedure: TRANSESOPHAGEAL ECHOCARDIOGRAM (TEE);  Surgeon: Chilton Si, MD;  Location: [facility] ENDOSCOPY;  Service: Cardiovascular;  Laterality: N/A;  . TEE WITHOUT CARDIOVERSION N/A 06/11/2018   Procedure: TRANSESOPHAGEAL ECHOCARDIOGRAM (TEE);  Surgeon: Elease Hashimoto Deloris Ping, MD;  Location: [facility] ENDOSCOPY;  Service: Cardiovascular;  Laterality: N/A;  . UMBILICAL HERNIA REPAIR     x 2    ROS- all systems are reviewed and negatives except as per HPI above  Current Outpatient Medications  Medication Sig Dispense Refill  . acetaminophen (TYLENOL) 500 MG tablet Take 1,000 mg by mouth every 6 (six) hours as needed (for pain).    Marland Kitchen amLODipine (NORVASC) 5 MG tablet TAKE ONE TABLET BY MOUTH ONCE DAILY (Patient taking differently: Take 5 mg by mouth every evening. ) 30 tablet 1  . ferrous sulfate 325 (65 FE) MG tablet Take 325 mg by mouth daily with breakfast.    . folic acid (FOLVITE) 1 MG tablet Take 1 tablet (1 mg total) by mouth daily. (Patient taking differently: Take 1 mg by mouth every evening. ) 100 tablet 3  . furosemide (LASIX) 40 MG tablet Take 20 mg by mouth daily.    Marland Kitchen losartan (COZAAR) 100 MG tablet Take 0.5 tablets (50 mg total) by mouth 2 (two) times daily. 90 tablet 3  . metoprolol tartrate (LOPRESSOR) 25 MG tablet Take 100mg  in the AM and 75mg  in the PM (Patient taking differently: Take 75-100 mg by mouth See admin instructions. TAKE 4 TABLETS (100 MG) BY MOUTH IN THE MORNING & TAKE 3 TABLETS (75 MG) BY MOUTH IN THE EVENING.) 210 tablet 2  . Multiple Vitamin (MULTIVITAMIN) tablet Take 1-2 tablets by mouth See admin instructions. Take 2 tablets in the morning and 1 tablet in the  evening    . omeprazole (PRILOSEC) 40 MG capsule Take 40 mg by mouth daily.   1  . spironolactone (ALDACTONE) 50 MG tablet TAKE ONE TABLET BY MOUTH ONCE DAILY (Patient taking differently: Take 50 mg by mouth daily. ) 30 tablet 1  . Vitamin D, Ergocalciferol, (DRISDOL) 1.25 MG (50000 UT) CAPS capsule Take 50,000 Units by mouth every Tuesday.     . vitamin E (VITAMIN E) 400 UNIT capsule Take 400 Units by mouth daily.    [name] Hurl 20 MG TABS tablet TAKE ONE TABLET BY MOUTH DAILY WITH  SUPPER (Patient taking differently: Take 20 mg by mouth daily with supper. ) 30 tablet 1  . flecainide (TAMBOCOR) 50 MG tablet Take 1 tablet (50 mg total) by mouth 2 (two) times daily. 180 tablet 3   No current facility-administered medications for this visit.    Facility-Administered Medications Ordered in Other Visits  Medication Dose Route Frequency Provider Last Rate Last Dose  . gadopentetate dimeglumine (MAGNEVIST) injection 20 mL  20 mL Intravenous Once PRN Anson Fret, MD  Physical Exam: Vitals:   05/29/19 1128  BP: 136/88  Pulse: (!) 109  SpO2: 96%  Weight: 215 lb 3.2 oz (97.6 kg)  Height: 5\' 6"  (1.676 m)    GEN- The patient is well appearing, alert and oriented x 3 today.   Head- normocephalic, atraumatic Eyes-  Sclera clear, conjunctiva pink Ears- hearing intact Oropharynx- clear Lungs-   normal work of breathing Heart- tachycardic irregular rhythm  GI- soft, NT, ND, + BS Extremities- no clubbing, cyanosis, or edema  Wt Readings from Last 3 Encounters:  05/29/19 215 lb 3.2 oz (97.6 kg)  04/29/19 209 lb 3.2 oz (94.9 kg)  04/14/19 209 lb 3.2 oz (94.9 kg)    EKG tracing ordered today is personally reviewed and shows afib with RVR  Assessment and Plan:  1. Persistent afib He is s/p ablation x 2 Now in afib more frequently Previously had qt prolongation with tikosyn Start flecainide 50mg  BID Return to AF clinic on Monday.  Consider increasing to 100mg  BID at that time if  still in afib depending on ekg findings Continue xarelto If he fails medical therapy with flecainide, would need to consider multaq or amiodarone  2. HTN Stable No change required today  3. Overweight Lifestyle modification encouraged today  4. Nonischemic CM Previously resolved with sinus Once back in sinus x 6 weeks, repeat echo  Follow-up in AF clinic next week  MD, [facility] 05/29/2019 12:24 PM

## 2019-06-02 ENCOUNTER — Ambulatory Visit (HOSPITAL_COMMUNITY)
Admission: RE | Admit: 2019-06-02 | Discharge: 2019-06-02 | Disposition: A | Discharge status: Home / Self Care | Payer: BC Managed Care – PPO | Source: Ambulatory Visit | Attending: Nurse Practitioner | Admitting: Nurse Practitioner

## 2019-06-02 ENCOUNTER — Other Ambulatory Visit: Payer: Self-pay

## 2019-06-02 ENCOUNTER — Encounter (HOSPITAL_COMMUNITY): Payer: Self-pay | Admitting: Nurse Practitioner

## 2019-06-02 VITALS — BP 140/96 | HR 123 | Ht 66.0 in | Wt 216.6 lb

## 2019-06-02 DIAGNOSIS — Z8673 Personal history of transient ischemic attack (TIA), and cerebral infarction without residual deficits: Secondary | ICD-10-CM | POA: Insufficient documentation

## 2019-06-02 DIAGNOSIS — Z833 Family history of diabetes mellitus: Secondary | ICD-10-CM | POA: Insufficient documentation

## 2019-06-02 DIAGNOSIS — G4730 Sleep apnea, unspecified: Secondary | ICD-10-CM | POA: Diagnosis not present

## 2019-06-02 DIAGNOSIS — Z8249 Family history of ischemic heart disease and other diseases of the circulatory system: Secondary | ICD-10-CM | POA: Diagnosis not present

## 2019-06-02 DIAGNOSIS — Z87891 Personal history of nicotine dependence: Secondary | ICD-10-CM | POA: Diagnosis not present

## 2019-06-02 DIAGNOSIS — Z9884 Bariatric surgery status: Secondary | ICD-10-CM | POA: Diagnosis not present

## 2019-06-02 DIAGNOSIS — I471 Supraventricular tachycardia: Secondary | ICD-10-CM | POA: Insufficient documentation

## 2019-06-02 DIAGNOSIS — I10 Essential (primary) hypertension: Secondary | ICD-10-CM | POA: Insufficient documentation

## 2019-06-02 DIAGNOSIS — I4819 Other persistent atrial fibrillation: Secondary | ICD-10-CM | POA: Diagnosis not present

## 2019-06-02 DIAGNOSIS — Z79899 Other long term (current) drug therapy: Secondary | ICD-10-CM | POA: Insufficient documentation

## 2019-06-02 DIAGNOSIS — D509 Iron deficiency anemia, unspecified: Secondary | ICD-10-CM | POA: Insufficient documentation

## 2019-06-02 DIAGNOSIS — M19079 Primary osteoarthritis, unspecified ankle and foot: Secondary | ICD-10-CM | POA: Diagnosis not present

## 2019-06-02 DIAGNOSIS — Z7901 Long term (current) use of anticoagulants: Secondary | ICD-10-CM | POA: Insufficient documentation

## 2019-06-02 DIAGNOSIS — K219 Gastro-esophageal reflux disease without esophagitis: Secondary | ICD-10-CM | POA: Insufficient documentation

## 2019-06-02 DIAGNOSIS — Z6834 Body mass index (BMI) 34.0-34.9, adult: Secondary | ICD-10-CM | POA: Diagnosis not present

## 2019-06-02 DIAGNOSIS — R9431 Abnormal electrocardiogram [ECG] [EKG]: Secondary | ICD-10-CM | POA: Insufficient documentation

## 2019-06-02 DIAGNOSIS — Z823 Family history of stroke: Secondary | ICD-10-CM | POA: Diagnosis not present

## 2019-06-02 DIAGNOSIS — I428 Other cardiomyopathies: Secondary | ICD-10-CM | POA: Diagnosis not present

## 2019-06-02 NOTE — Progress Notes (Signed)
Date:  06/02/2019   ID:  Tyrone [name], DOB January 19, 1966, MRN [id number]  Location: home  Provider location: [address] [place], [place] [address] Evaluation Performed: f/u start of flecainide  PCP:  Shirlean Mylar, MD  Primary Cardiologist: none Primary Electrophysiologist: Dr. [name]   CC: Persistent afib     History of Present Illness: Tyrone [name] is a 53 y.o. male who presents for evaluation for return of afib after successful cardioversion. He was seen by Dr. [name] and suggested start of flecainide. He did not start drug until Sunday pm. He remains in aflutter at 123 bpm. No  missed doses of anticoagulation.   Today, he denies symptoms of palpitations, chest pain, shortness of breath, orthopnea, PND, lower extremity edema, claudication, dizziness, presyncope, syncope, bleeding, or neurologic sequela. The patient is tolerating medications without difficulties and is otherwise without complaint today.   he denies symptoms of cough, fevers, chills, or new SOB worrisome for COVID 19.     Atrial Fibrillation Risk Factors:  he does have symptoms or diagnosis of sleep apnea. he is compliant with CPAP therapy. he does not have a history of rheumatic fever. he does not have a history of alcohol use. The patient does not have a history of early familial atrial fibrillation or other arrhythmias.  he has a BMI of Body mass index is 34.96 kg/m.Marland Kitchen Filed Weights   06/02/19 1353  Weight: 98.2 kg    Past Medical History:  Diagnosis Date  . Anemia, iron deficiency 01/30/2017  . Cervicalgia   . CVA (cerebral infarction) 05/01/12   on pradaxa at time of stroke, switched to xarelto  . External thrombosed hemorrhoids   . GERD (gastroesophageal reflux disease)   . Guillain Barr syndrome [facility])    following seasonal influenza vaccination  . Guillain-Barre syndrome ([facility])    from stroke in 2013  . Hematuria    a. 01/2012 - to f/u with urology as outpt.  Marland Kitchen Hernia of unspecified site  of abdominal cavity without mention of obstruction or gangrene    of abdominal  . Hx of gastric bypass 01/30/2017   2004 Dr [facility] weight 450 lbs  . Hypertension   . Iron malabsorption 01/30/2017   Hx gastric bypass 2004  . Morbid obesity s/p gastric bypass   . Nonischemic cardiomyopathy ([facility])    a. 01/2012 Echo: EF 35-40%, Diff HK, mild MR;  b. 01/2012 R&L Heart Cath: mildly elevated R heart pressures (pcwp 17) , nl cors.  . Osteoarthrosis, unspecified whether generalized or localized, ankle and foot    of the knees,ankle and foot  . Pancytopenia   . Persistent atrial fibrillation ([facility])    a. Pradaxa started 01/2012;  b .s/p TEE/DCCV 02/16/2012; c. recurrent afib->Tikosyn Initiation 03/2012  . Sleep apnea    CPAP compliant  . Status post bypass gastrojejunostomy 01/30/2017  . Stroke ([facility])   . Unspecified intestinal malabsorption   . Urticaria, unspecified   . Vitamin B 12 deficiency    Past Surgical History:  Procedure Laterality Date  . ATRIAL FIBRILLATION ABLATION N/A 06/25/2012   PVI by Dr [name]  . ATRIAL FIBRILLATION ABLATION N/A 10/04/2017   Procedure: ATRIAL FIBRILLATION ABLATION;  Surgeon: Hillis Range, MD;  [facility] INV[facility] CV LAB;  Service: Cardiovascular;  Laterality: N/A;  . CARDIOVERSION  02/16/2012   Procedure: CARDIOVERSION;  Surgeon: [name], MD;  Location: [facility] ENDOSCOPY;  Service: Cardiovascular;  Laterality: N/A;  . CARDIOVERSION  04/22/2012   Procedure: CARDIOVERSION;  Surgeon: Duke SalviaSteven C Klein, MD;  [facility];  Service: Cardiovascular;  Laterality: N/A;  . CARDIOVERSION N/A 08/22/2017   Procedure: CARDIOVERSION;  Surgeon: Chilton Siandolph, Tiffany, MD;  [facility];  Service: Cardiovascular;  Laterality: N/A;  . CARDIOVERSION N/A 06/11/2018   Procedure: CARDIOVERSION;  Surgeon: Vesta MixerNahser, Philip J, MD;  [facility];  Service: Cardiovascular;  Laterality: N/A;  . CARDIOVERSION N/A 04/29/2019   Procedure: CARDIOVERSION;   Surgeon: Sande Rives'Neal, [place] Thomas, MD;  [facility];  Service: Endoscopy;  Laterality: N/A;  . COLONOSCOPY N/A 07/07/2016   Procedure: COLONOSCOPY;  Surgeon: Jeani HawkingPatrick Hung, MD;  Location: [facility] ENDOSCOPY;  Service: Endoscopy;  Laterality: N/A;  . ESOPHAGOGASTRODUODENOSCOPY N/A 07/07/2016   Procedure: ESOPHAGOGASTRODUODENOSCOPY (EGD);  Surgeon: Jeani HawkingPatrick Hung, MD;  [facility];  Service: Endoscopy;  Laterality: N/A;  . GASTRIC BYPASS  2004  . HERNIA REPAIR    . LEFT AND RIGHT HEART CATHETERIZATION WITH CORONARY ANGIOGRAM N/A 02/13/2012   Procedure: LEFT AND RIGHT HEART CATHETERIZATION WITH CORONARY ANGIOGRAM;  Surgeon: Tonny BollmanMichael Cooper, MD;  [facility];  Service: Cardiovascular;  Laterality: N/A;  . TEE WITHOUT CARDIOVERSION  02/16/2012   Procedure: TRANSESOPHAGEAL ECHOCARDIOGRAM (TEE);  Surgeon: Lewayne BuntingBrian S Crenshaw, MD;  [facility];  Service: Cardiovascular;  Laterality: N/A;  10a CV  . TEE WITHOUT CARDIOVERSION  06/24/2012   Procedure: TRANSESOPHAGEAL ECHOCARDIOGRAM (TEE);  Surgeon: Peter M SwazilandJordan, MD;  [facility];  Service: Cardiovascular;  Laterality: N/A;  . TEE WITHOUT CARDIOVERSION N/A 08/22/2017   Procedure: TRANSESOPHAGEAL ECHOCARDIOGRAM (TEE);  Surgeon: Chilton Siandolph, Tiffany, MD;  [facility];  Service: Cardiovascular;  Laterality: N/A;  . TEE WITHOUT CARDIOVERSION N/A 06/11/2018   Procedure: TRANSESOPHAGEAL ECHOCARDIOGRAM (TEE);  Surgeon: Elease HashimotoNahser, Deloris PingPhilip J, MD;  [facility];  Service: Cardiovascular;  Laterality: N/A;  . UMBILICAL HERNIA REPAIR     x 2     Current Outpatient Medications  Medication Sig Dispense Refill  . acetaminophen (TYLENOL) 500 MG tablet Take 1,000 mg by mouth every 6 (six) hours as needed (for pain).    Marland Kitchen. amLODipine (NORVASC) 5 MG tablet TAKE ONE TABLET BY MOUTH ONCE DAILY (Patient taking differently: Take 5 mg by mouth every evening. ) 30 tablet 1  . ferrous sulfate 325 (65 FE) MG tablet Take 325 mg by  mouth daily with breakfast.    . flecainide (TAMBOCOR) 50 MG tablet Take 1 tablet (50 mg total) by mouth 2 (two) times daily. 180 tablet 3  . folic acid (FOLVITE) 1 MG tablet Take 1 tablet (1 mg total) by mouth daily. (Patient taking differently: Take 1 mg by mouth every evening. ) 100 tablet 3  . furosemide (LASIX) 40 MG tablet Take 20 mg by mouth daily.    Marland Kitchen. losartan (COZAAR) 100 MG tablet Take 0.5 tablets (50 mg total) by mouth 2 (two) times daily. 90 tablet 3  . metoprolol tartrate (LOPRESSOR) 25 MG tablet Take 100mg  in the AM and 75mg  in the PM (Patient taking differently: Take 75-100 mg by mouth See admin instructions. TAKE 4 TABLETS (100 MG) BY MOUTH IN THE MORNING & TAKE 3 TABLETS (75 MG) BY MOUTH IN THE EVENING.) 210 tablet 2  . Multiple Vitamin (MULTIVITAMIN) tablet Take 1-2 tablets by mouth See admin instructions. Take 2 tablets in the morning and 1 tablet in the evening    . omeprazole (PRILOSEC) 40 MG capsule Take 40 mg by mouth daily.   1  . spironolactone (ALDACTONE) 50 MG  tablet TAKE ONE TABLET BY MOUTH ONCE DAILY (Patient taking differently: Take 50 mg by mouth daily. ) 30 tablet 1  . Vitamin D, Ergocalciferol, (DRISDOL) 1.25 MG (50000 UT) CAPS capsule Take 50,000 Units by mouth every Tuesday.     . vitamin E (VITAMIN E) 400 UNIT capsule Take 400 Units by mouth daily.    [name] Hurl 20 MG TABS tablet TAKE ONE TABLET BY MOUTH DAILY WITH  SUPPER (Patient taking differently: Take 20 mg by mouth daily with supper. ) 30 tablet 1   No current facility-administered medications for this encounter.    Facility-Administered Medications Ordered in Other Encounters  Medication Dose Route Frequency Provider Last Rate Last Dose  . gadopentetate dimeglumine (MAGNEVIST) injection 20 mL  20 mL Intravenous Once PRN Anson Fret, MD        Allergies:   Patient has no known allergies.   Social History:  The patient  reports that he has never smoked. He quit smokeless tobacco use about 16 years  ago.  His smokeless tobacco use included chew. He reports current alcohol use. He reports that he does not use drugs.   Family History:  The patient's  family history includes Diabetes in his father; Heart attack (age of onset: 26) in his father; Hypertension in his father and mother; Kidney disease in his father; Stroke in his paternal grandmother.    ROS:  Please see the history of present illness.   All other systems are personally reviewed and negative.   Exam: GEN- The patient is well appearing, alert and oriented x 3 today.   Head- normocephalic, atraumatic Eyes-  Sclera clear, conjunctiva pink Ears- hearing intact Oropharynx- clear Neck- supple, no JVP Lymph- no cervical lymphadenopathy Lungs- Clear to ausculation bilaterally, normal work of breathing Heart- irregular rate and rhythm, no murmurs, rubs or gallops, PMI not laterally displaced GI- soft, NT, ND, + BS Extremities- no clubbing, cyanosis, or edema MS- no significant deformity or atrophy Skin- no rash or lesion Psych- euthymic mood, full affect Neuro- strength and sensation are intact  Recent Labs: 04/25/2019: BUN 13; Creatinine, Ser 0.76; Hemoglobin 12.3; Platelets 157; Potassium 4.9; Sodium 138  personally reviewed    Other studies personally reviewed: Ekg- probable atrial flutter at 123 bpm, pr int 200 ms, qrs int 108 ms, qtc 472 ms    ASSESSMENT AND PLAN:  1. Persistent atrial fibrillation  s/p 2nd ablation 2019 Recent cardioversion but with ERAF Placed on flecainide 50 mg bid  By Dr. [name] Has not been on 72 hours yet He will repeat EKG with PCP on Wednesday and fax here Determined in the past not  to be a tikosyn candidate for prolonged qtc. Continue  BB to 100 mg am and 75 mg pm Continue xarelto 20 mg daily, states no missed doses x 3 weeks  This patients CHA2DS2-VASc Score and unadjusted Ischemic Stroke Rate (% per year) is equal to 2.2 % stroke rate/year from a score of 2 Above score calculated  as 1 point each if present [CHF, HTN, DM, Vascular=MI/PAD/Aortic Plaque, Age if 65-74, or Male] Above score calculated as 2 points each if present [Age > 75, or Stroke/TIA/TE] COVID screen The patient does not have any symptoms that suggest any further testing/ screening at this time.  Social distancing reinforced today.    Will f/u with pt after seeing ekg on Wednesday, if intervals look OK and still not in SR, plan to increase flecainide to 100 mg bid and then set up  cardioversion. Pt is in agreement   Labs/ tests ordered today include: none Orders Placed This Encounter  Procedures  . EKG 12-Lead  . EKG 12-Lead    Patient Risk:  after full review of this patients clinical status, I feel that they are at moderate risk at this time.  Signed, Roderic Palau NP  06/02/2019 2:10 PM  Afib [facility] [address] [place], [place] [address] [phone]

## 2019-06-03 ENCOUNTER — Telehealth: Payer: Self-pay | Admitting: Internal Medicine

## 2019-06-03 NOTE — Telephone Encounter (Signed)
Talked with Laverta Baltimore - pt needs ekg to follow up flecainide start pt wanted at PCP to reduce driving time- to be faxed to our office at [phone].

## 2019-06-03 NOTE — Telephone Encounter (Signed)
New message   Per Laverta Baltimore at [facility] [place] has a question about an ekg for this patient. Please call to discuss.

## 2019-06-04 DIAGNOSIS — I482 Chronic atrial fibrillation, unspecified: Secondary | ICD-10-CM | POA: Diagnosis not present

## 2019-06-05 ENCOUNTER — Telehealth (HOSPITAL_COMMUNITY): Payer: Self-pay | Admitting: *Deleted

## 2019-06-05 NOTE — Telephone Encounter (Signed)
Patient had EKG at [facility] for follow up flecainide start. NSR 1st degree AV block HR 81 PR 248 Incomplete RBBB. Discussed with patient he does not feel any different since starting flecainide and is still seeing elevated HRs on his apple watch which is how he usually knows when he is in AF. Per Roderic Palau NP will keep at flecainide 50mg  twice a day with follow up next week. Pt verbalized understanding.

## 2019-06-08 ENCOUNTER — Encounter: Payer: Self-pay | Admitting: Neurology

## 2019-06-10 ENCOUNTER — Other Ambulatory Visit: Payer: Self-pay

## 2019-06-10 ENCOUNTER — Ambulatory Visit: Payer: BC Managed Care – PPO | Admitting: Adult Health

## 2019-06-10 ENCOUNTER — Encounter: Payer: Self-pay | Admitting: Adult Health

## 2019-06-10 VITALS — BP 134/80 | HR 80 | Temp 97.9°F | Ht 66.0 in | Wt 216.0 lb

## 2019-06-10 DIAGNOSIS — G4733 Obstructive sleep apnea (adult) (pediatric): Secondary | ICD-10-CM | POA: Diagnosis not present

## 2019-06-10 DIAGNOSIS — Z9989 Dependence on other enabling machines and devices: Secondary | ICD-10-CM | POA: Diagnosis not present

## 2019-06-10 NOTE — Patient Instructions (Signed)
Your Plan:  Continue using CPAP nightly and greater than 4 hours each night Order sent for new supplies If your symptoms worsen or you develop new symptoms please let us know.    Thank you for coming to see [place] at [facility]. I hope we have been able to provide you high quality care today.  You may receive a patient satisfaction survey over the next few weeks. We would appreciate your feedback and comments so that we may continue to improve ourselves and the health of our patients.

## 2019-06-10 NOTE — Progress Notes (Signed)
PATIENT: Tyrone [name] DOB: 03-24-1966  REASON FOR VISIT: follow up HISTORY FROM: patient  HISTORY OF PRESENT ILLNESS: Today 06/10/19:  Tyrone [name] is a 53 year old male with a history of obstructive sleep apnea on CPAP.  His download indicates that he uses machine 26 out of 30 days for compliance of 87%.  He uses machine greater than 4 hours 20 days for compliance of 67%.  On average he uses his machine 5 hours and 19 minutes.  His residual AHI is 0.5 on 6 to 13 cm of water with EPR of 3.  His leak in the 95th percentile is 15.3 L/min.  He states that there are some nights he has trouble sleeping.  Therefore he may wake up and stay up several hours and then goes off later.  He continues to see the benefit of using the CPAP.  He returns today for an evaluation.  HISTORY 12/05/18: Tyrone [name] is a 53 year old male with a history of obstructive sleep apnea on CPAP.  He returns today for a virtual visit.  His download indicates that he used his machine 27 out of 30 days for compliance of 90%.  He uses machine greater than 4 hours 10 out of 30 days for compliance of 33%.  On average he uses his machine 3 hours and 44 minutes.  His residual AHI is 0.6 on 6-13 cm of water with EPR 3.  He does not have a significant leak.  He states that most nights he is watching television and tends to fall asleep without putting the CPAP on and will put it on when he wakes up later on.  He denies any significant daytime sleepiness.  He joins me today for virtual visit.  REVIEW OF SYSTEMS: Out of a complete 14 system review of symptoms, the patient complains only of the following symptoms, and all other reviewed systems are negative.  See HPI  ALLERGIES: No Known Allergies  HOME MEDICATIONS: Outpatient Medications Prior to Visit  Medication Sig Dispense Refill  . acetaminophen (TYLENOL) 500 MG tablet Take 1,000 mg by mouth every 6 (six) hours as needed (for pain).    Marland Kitchen amLODipine (NORVASC) 5 MG tablet TAKE ONE  TABLET BY MOUTH ONCE DAILY (Patient taking differently: Take 5 mg by mouth every evening. ) 30 tablet 1  . ferrous sulfate 325 (65 FE) MG tablet Take 325 mg by mouth daily with breakfast.    . flecainide (TAMBOCOR) 50 MG tablet Take 1 tablet (50 mg total) by mouth 2 (two) times daily. 180 tablet 3  . folic acid (FOLVITE) 1 MG tablet Take 1 tablet (1 mg total) by mouth daily. (Patient taking differently: Take 1 mg by mouth every evening. ) 100 tablet 3  . furosemide (LASIX) 40 MG tablet Take 20 mg by mouth daily.    . metoprolol tartrate (LOPRESSOR) 25 MG tablet Take 100mg  in the AM and 75mg  in the PM (Patient taking differently: Take 75-100 mg by mouth See admin instructions. TAKE 4 TABLETS (100 MG) BY MOUTH IN THE MORNING & TAKE 3 TABLETS (75 MG) BY MOUTH IN THE EVENING.) 210 tablet 2  . Multiple Vitamin (MULTIVITAMIN) tablet Take 1-2 tablets by mouth See admin instructions. Take 2 tablets in the morning and 1 tablet in the evening    . omeprazole (PRILOSEC) 40 MG capsule Take 40 mg by mouth daily.   1  . spironolactone (ALDACTONE) 50 MG tablet TAKE ONE TABLET BY MOUTH ONCE DAILY (Patient taking differently: Take  50 mg by mouth daily. ) 30 tablet 1  . Vitamin D, Ergocalciferol, (DRISDOL) 1.25 MG (50000 UT) CAPS capsule Take 50,000 Units by mouth every Tuesday.     . vitamin E (VITAMIN E) 400 UNIT capsule Take 400 Units by mouth daily.    Alveda Reasons 20 MG TABS tablet TAKE ONE TABLET BY MOUTH DAILY WITH  SUPPER (Patient taking differently: Take 20 mg by mouth daily with supper. ) 30 tablet 1  . losartan (COZAAR) 100 MG tablet Take 0.5 tablets (50 mg total) by mouth 2 (two) times daily. 90 tablet 3   Facility-Administered Medications Prior to Visit  Medication Dose Route Frequency Provider Last Rate Last Dose  . gadopentetate dimeglumine (MAGNEVIST) injection 20 mL  20 mL Intravenous Once PRN Melvenia Beam, MD        PAST MEDICAL HISTORY: Past Medical History:  Diagnosis Date  . Anemia, iron  deficiency 01/30/2017  . Cervicalgia   . CVA (cerebral infarction) 05/01/12   on pradaxa at time of stroke, switched to [place]  . External thrombosed hemorrhoids   . GERD (gastroesophageal reflux disease)   . Guillain Barr syndrome [facility])    following seasonal influenza vaccination  . Guillain-Barre syndrome ([place])    from stroke in 2013  . Hematuria    a. 01/2012 - to f/u with urology as outpt.  Marland Kitchen Hernia of unspecified site of abdominal cavity without mention of obstruction or gangrene    of abdominal  . Hx of gastric bypass 01/30/2017   2004 Dr Volanda Napoleon [facility] weight 450 lbs  . Hypertension   . Iron malabsorption 01/30/2017   Hx gastric bypass 2004  . Morbid obesity s/p gastric bypass   . Nonischemic cardiomyopathy ([place])    a. 01/2012 Echo: EF 35-40%, Diff HK, mild MR;  b. 01/2012 R&L Heart Cath: mildly elevated R heart pressures (pcwp 17) , nl cors.  . Osteoarthrosis, unspecified whether generalized or localized, ankle and foot    of the knees,ankle and foot  . Pancytopenia   . Persistent atrial fibrillation ([place])    a. Pradaxa started 01/2012;  b .s/p TEE/DCCV 02/16/2012; c. recurrent afib->Tikosyn Initiation 03/2012  . Sleep apnea    CPAP compliant  . Status post bypass gastrojejunostomy 01/30/2017  . Stroke ([place])   . Unspecified intestinal malabsorption   . Urticaria, unspecified   . Vitamin B 12 deficiency     PAST SURGICAL HISTORY: Past Surgical History:  Procedure Laterality Date  . ATRIAL FIBRILLATION ABLATION N/A 06/25/2012   PVI by Dr Rayann Heman  . ATRIAL FIBRILLATION ABLATION N/A 10/04/2017   Procedure: ATRIAL FIBRILLATION ABLATION;  Surgeon: Thompson Grayer, MD;  Location: [place] CV LAB;  Service: Cardiovascular;  Laterality: N/A;  . CARDIOVERSION  02/16/2012   Procedure: CARDIOVERSION;  Surgeon: Lelon Perla, MD;  Location: [facility] ENDOSCOPY;  Service: Cardiovascular;  Laterality: N/A;  . CARDIOVERSION  04/22/2012   Procedure: CARDIOVERSION;  Surgeon: Deboraha Sprang, MD;  Location: [facility] ENDOSCOPY;  Service: Cardiovascular;  Laterality: N/A;  . CARDIOVERSION N/A 08/22/2017   Procedure: CARDIOVERSION;  Surgeon: Skeet Latch, MD;  Location: [facility] ENDOSCOPY;  Service: Cardiovascular;  Laterality: N/A;  . CARDIOVERSION N/A 06/11/2018   Procedure: CARDIOVERSION;  Surgeon: Thayer Headings, MD;  Location: [facility] ENDOSCOPY;  Service: Cardiovascular;  Laterality: N/A;  . CARDIOVERSION N/A 04/29/2019   Procedure: CARDIOVERSION;  Surgeon: Geralynn Rile, MD;  Location: [place];  Service: Endoscopy;  Laterality: N/A;  . COLONOSCOPY N/A 07/07/2016  Procedure: COLONOSCOPY;  Surgeon: Jeani Hawking, MD;  Location: [facility] ENDOSCOPY;  Service: Endoscopy;  Laterality: N/A;  . ESOPHAGOGASTRODUODENOSCOPY N/A 07/07/2016   Procedure: ESOPHAGOGASTRODUODENOSCOPY (EGD);  Surgeon: Jeani Hawking, MD;  Location: [facility] ENDOSCOPY;  Service: Endoscopy;  Laterality: N/A;  . GASTRIC BYPASS  2004  . HERNIA REPAIR    . LEFT AND RIGHT HEART CATHETERIZATION WITH CORONARY ANGIOGRAM N/A 02/13/2012   Procedure: LEFT AND RIGHT HEART CATHETERIZATION WITH CORONARY ANGIOGRAM;  Surgeon: Tonny Bollman, MD;  Location: [facility] CATH LAB;  Service: Cardiovascular;  Laterality: N/A;  . TEE WITHOUT CARDIOVERSION  02/16/2012   Procedure: TRANSESOPHAGEAL ECHOCARDIOGRAM (TEE);  Surgeon: [name], MD;  Location[facility] ENDOSCOPY;  Service: Cardiovascular;  Laterality: N/A;  10a CV  . TEE WITHOUT CARDIOVERSION  06/24/2012   Procedure: TRANSESOPHAGEAL ECHOCARDIOGRAM (TEE);  Surgeon: [name], MD;  Location: [facility] ENDOSCOPY;  Service: Cardiovascular;  Laterality: N/A;  . TEE WITHOUT CARDIOVERSION N/A 08/22/2017   Procedure: TRANSESOPHAGEAL ECHOCARDIOGRAM (TEE);  Surgeon: Chilton Si, MD;  Location: [facility] ENDOSCOPY;  Service: Cardiovascular;  Laterality: N/A;  . TEE WITHOUT CARDIOVERSION N/A 06/11/2018   Procedure: TRANSESOPHAGEAL ECHOCARDIOGRAM (TEE);  Surgeon: Elease Hashimoto Deloris Ping, MD;  Location: [facility] ENDOSCOPY;   Service: Cardiovascular;  Laterality: N/A;  . UMBILICAL HERNIA REPAIR     x 2    FAMILY HISTORY: Family History  Problem Relation Age of Onset  . Hypertension Mother   . Hypertension Father   . Heart attack Father 24       Died  . Diabetes Father   . Kidney disease Father        with transplant  . Stroke Paternal Grandmother     SOCIAL HISTORY: Social History   Socioeconomic History  . Marital status: Married    Spouse name: Not on file  . Number of children: 1  . Years of education: Not on file  . Highest education level: Not on file  Occupational History  . Occupation: Veterinary surgeon  Social Needs  . Financial resource strain: Not on file  . Food insecurity    Worry: Not on file    Inability: Not on file  . Transportation needs    Medical: Not on file    Non-medical: Not on file  Tobacco Use  . Smoking status: Never Smoker  . Smokeless tobacco: Former Neurosurgeon    Types: Chew  Substance and Sexual Activity  . Alcohol use: Yes    Comment: occasional  . Drug use: No  . Sexual activity: Yes    Partners: Female  Lifestyle  . Physical activity    Days per week: Not on file    Minutes per session: Not on file  . Stress: Not on file  Relationships  . Social Musician on phone: Not on file    Gets together: Not on file    Attends religious service: Not on file    Active member of club or organization: Not on file    Attends meetings of clubs or organizations: Not on file    Relationship status: Not on file  . Intimate partner violence    Fear of current or ex partner: Not on file    Emotionally abused: Not on file    Physically abused: Not on file    Forced sexual activity: Not on file  Other Topics Concern  . Not on file  Social History Narrative   Lives with wife.  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  Generalized: Well developed, in no acute distress  Chest: Lungs clear to auscultation bilaterally  Neurological examination  Mentation: Alert oriented to time, place, history taking. Follows all commands speech and language fluent Cranial nerve II-XII: Extraocular movements were full, visual field were full on confrontational test Head turning and shoulder shrug  were normal and symmetric. Motor: The motor testing reveals 5 over 5 strength of all 4 extremities. Good symmetric motor tone is noted throughout.  Sensory: Sensory testing is intact to soft touch on all 4 extremities. No evidence of extinction is noted.  Gait and station: Gait is normal.    DIAGNOSTIC DATA (LABS, IMAGING, TESTING) - I reviewed patient records, labs, notes, testing and imaging myself where available.  Lab Results  Component Value Date   WBC 4.6 04/25/2019   HGB 12.3 (L) 04/25/2019   HCT 38.3 (L) 04/25/2019   MCV 105.2 (H) 04/25/2019   PLT 157 04/25/2019      Component Value Date/Time   NA 138 04/25/2019 1507   NA 142 09/27/2017 1144   K 4.9 04/25/2019 1507   CL 107 04/25/2019 1507   CO2 25 04/25/2019 1507   GLUCOSE 120 (H) 04/25/2019 1507   BUN 13 04/25/2019 1507   BUN 11 09/27/2017 1144   CREATININE 0.76 04/25/2019 1507   CREATININE 0.80 04/12/2016 1441   CALCIUM 8.2 (L) 04/25/2019 1507   PROT 6.4 08/14/2017 0936   ALBUMIN 4.0 08/14/2017 0936   AST 25 08/14/2017 0936   ALT 18 08/14/2017 0936   ALKPHOS 77 08/14/2017 0936   BILITOT 0.6 08/14/2017 0936   GFRNONAA >60 04/25/2019 1507   GFRAA >60 04/25/2019 1507   Lab Results  Component Value Date   CHOL 165 06/15/2015   HDL 85 06/15/2015   LDLCALC 71 06/15/2015   TRIG 46 06/15/2015   CHOLHDL 1.9 06/15/2015   Lab Results  Component Value Date   HGBA1C 5.6 05/01/2012   Lab Results  Component Value Date   VITAMINB12 512 08/14/2017   Lab Results  Component Value  Date   TSH 2.220 08/14/2017      ASSESSMENT AND PLAN 53 y.o. year old male  has a past medical history of Anemia, iron deficiency (01/30/2017), Cervicalgia, CVA (cerebral infarction) (05/01/12), External thrombosed hemorrhoids, GERD (gastroesophageal reflux disease), Guillain Barr syndrome (HCC), Guillain-Barre syndrome (HCC), Hematuria, Hernia of unspecified site of abdominal cavity without mention of obstruction or gangrene, gastric bypass (01/30/2017), Hypertension, Iron malabsorption (01/30/2017), Morbid obesity s/p gastric bypass, Nonischemic cardiomyopathy (HCC), Osteoarthrosis, unspecified whether generalized or localized, ankle and foot, Pancytopenia, Persistent atrial fibrillation (HCC), Sleep apnea, Status post bypass gastrojejunostomy (01/30/2017), Stroke (HCC), Unspecified intestinal malabsorption, Urticaria, unspecified, and Vitamin B 12 deficiency. here with :  1. Obstructive sleep apnea on CPAP  The patient's CPAP download shows excellent compliance and good treatment of his apnea.  He is encouraged to continue using CPAP nightly and greater than 4 hours each night.  He is advised that if his symptoms worsen or he develops new symptoms he should let us know.  He will follow-up in 1 year or sooner if needed   I spent 15 minutes with the patient. 50% of this time was spent reviewing CPAP download   Butch PennyMegan Yair Dusza, MSN, NP-C  06/10/2019, 3:46 PM [facility] [address], [address] [place], [place] [address] [phone]

## 2019-06-11 ENCOUNTER — Ambulatory Visit (HOSPITAL_COMMUNITY)
Admission: RE | Admit: 2019-06-11 | Discharge: 2019-06-11 | Disposition: A | Discharge status: Home / Self Care | Payer: BC Managed Care – PPO | Source: Ambulatory Visit | Attending: Nurse Practitioner | Admitting: Nurse Practitioner

## 2019-06-11 ENCOUNTER — Other Ambulatory Visit: Payer: Self-pay

## 2019-06-11 ENCOUNTER — Encounter (HOSPITAL_COMMUNITY): Payer: Self-pay | Admitting: Nurse Practitioner

## 2019-06-11 VITALS — BP 144/112 | HR 119 | Ht 66.0 in | Wt 214.8 lb

## 2019-06-11 DIAGNOSIS — I4891 Unspecified atrial fibrillation: Secondary | ICD-10-CM | POA: Diagnosis not present

## 2019-06-11 DIAGNOSIS — I4892 Unspecified atrial flutter: Secondary | ICD-10-CM | POA: Insufficient documentation

## 2019-06-11 DIAGNOSIS — Z8673 Personal history of transient ischemic attack (TIA), and cerebral infarction without residual deficits: Secondary | ICD-10-CM | POA: Diagnosis not present

## 2019-06-11 DIAGNOSIS — K219 Gastro-esophageal reflux disease without esophagitis: Secondary | ICD-10-CM | POA: Diagnosis not present

## 2019-06-11 DIAGNOSIS — Z7901 Long term (current) use of anticoagulants: Secondary | ICD-10-CM | POA: Diagnosis not present

## 2019-06-11 DIAGNOSIS — I4819 Other persistent atrial fibrillation: Secondary | ICD-10-CM

## 2019-06-11 DIAGNOSIS — I428 Other cardiomyopathies: Secondary | ICD-10-CM | POA: Insufficient documentation

## 2019-06-11 DIAGNOSIS — D509 Iron deficiency anemia, unspecified: Secondary | ICD-10-CM | POA: Diagnosis not present

## 2019-06-11 DIAGNOSIS — M1990 Unspecified osteoarthritis, unspecified site: Secondary | ICD-10-CM | POA: Diagnosis not present

## 2019-06-11 DIAGNOSIS — G4730 Sleep apnea, unspecified: Secondary | ICD-10-CM | POA: Diagnosis not present

## 2019-06-11 DIAGNOSIS — D6869 Other thrombophilia: Secondary | ICD-10-CM

## 2019-06-11 DIAGNOSIS — G610 Guillain-Barre syndrome: Secondary | ICD-10-CM | POA: Insufficient documentation

## 2019-06-11 DIAGNOSIS — Z20828 Contact with and (suspected) exposure to other viral communicable diseases: Secondary | ICD-10-CM | POA: Diagnosis not present

## 2019-06-11 DIAGNOSIS — Z01818 Encounter for other preprocedural examination: Secondary | ICD-10-CM | POA: Diagnosis not present

## 2019-06-11 DIAGNOSIS — I10 Essential (primary) hypertension: Secondary | ICD-10-CM | POA: Insufficient documentation

## 2019-06-11 DIAGNOSIS — Z79899 Other long term (current) drug therapy: Secondary | ICD-10-CM | POA: Insufficient documentation

## 2019-06-11 LAB — BASIC METABOLIC PANEL
Anion gap: 9 (ref 5–15)
BUN: 8 mg/dL (ref 6–20)
CO2: 28 mmol/L (ref 22–32)
Calcium: 9 mg/dL (ref 8.9–10.3)
Chloride: 101 mmol/L (ref 98–111)
Creatinine, Ser: 0.83 mg/dL (ref 0.61–1.24)
GFR calc Af Amer: >60 mL/min (ref >60)
GFR calc non Af Amer: >60 mL/min (ref >60)
Glucose, Bld: 108 mg/dL — ABNORMAL HIGH (ref 70–99)
Potassium: 5.2 mmol/L — ABNORMAL HIGH (ref 3.5–5.1)
Sodium: 138 mmol/L (ref 135–145)

## 2019-06-11 LAB — CBC
HCT: 41.4 % (ref 39.0–52.0)
Hemoglobin: 13.6 g/dL (ref 13.0–17.0)
MCH: 34.1 pg — ABNORMAL HIGH (ref 26.0–34.0)
MCHC: 32.9 g/dL (ref 30.0–36.0)
MCV: 103.8 fL — ABNORMAL HIGH (ref 80.0–100.0)
Platelets: 177 10*3/uL (ref 150–400)
RBC: 3.99 MIL/uL — ABNORMAL LOW (ref 4.22–5.81)
RDW: 13.1 % (ref 11.5–15.5)
WBC: 5.1 10*3/uL (ref 4.0–10.5)
nRBC: 0 % (ref 0.0–0.2)

## 2019-06-11 NOTE — Patient Instructions (Signed)
Cardioversion scheduled for Wednesday, November 25th  - Arrive at the [facility] and go to admitting at [place] not eat or drink anything after midnight the night prior to your procedure.  - Take all your morning medication with a sip of water prior to arrival.  - You will not be able to drive home after your procedure.

## 2019-06-11 NOTE — Progress Notes (Signed)
Date:  06/11/2019   ID:  Tyrone [name], DOB 1966/03/27, MRN [id number]   Provider location: [place], [place] [address] Evaluation Performed: f/u start of flecainide  PCP:  Maurice Small, MD  Primary Cardiologist: none Primary Electrophysiologist: Dr. Rayann Heman   CC: Persistent afib     History of Present Illness: Tyrone [name] is a 53 y.o. male who presents for evaluation for return of afib after successful cardioversion. He was seen by Dr. Rayann Heman and suggested start of flecainide. He did not start drug until Sunday pm. He remains in aflutter at 123 bpm. No  missed doses of anticoagulation.   F/u in afib clinic, 11/19. He had ekg faxed from PCP office in [place]  after on flecainide 50 mg bid for 3 days. On that EKG he was in SR with a first degree AV block and IRBBB. He was kept on 50 mg flecainide instead of increasing the dose to 100 mg bid. Unfortunately,  he appears to be in atrial flutter at 119 bpm and watch has correlated this for last week. We will set up for DCCV.  Today, he denies symptoms of palpitations, chest pain, shortness of breath, orthopnea, PND, lower extremity edema, claudication, dizziness, presyncope, syncope, bleeding, or neurologic sequela. The patient is tolerating medications without difficulties and is otherwise without complaint today.   he denies symptoms of cough, fevers, chills, or new SOB worrisome for COVID 19.     Atrial Fibrillation Risk Factors:  he does have symptoms or diagnosis of sleep apnea. he is compliant with CPAP therapy. he does not have a history of rheumatic fever. he does not have a history of alcohol use. The patient does not have a history of early familial atrial fibrillation or other arrhythmias.  he has a BMI of Body mass index is 34.67 kg/m.Marland Kitchen Filed Weights   06/11/19 1344  Weight: 97.4 kg    Past Medical History:  Diagnosis Date  . Anemia, iron deficiency 01/30/2017  . Cervicalgia   . CVA (cerebral  infarction) 05/01/12   on pradaxa at time of stroke, switched to [place]  . External thrombosed hemorrhoids   . GERD (gastroesophageal reflux disease)   . Guillain Barr syndrome [facility])    following seasonal influenza vaccination  . Guillain-Barre syndrome ([facility])    from stroke in 2013  . Hematuria    a. 01/2012 - to f/u with urology as outpt.  Marland Kitchen Hernia of unspecified site of abdominal cavity without mention of obstruction or gangrene    of abdominal  . Hx of gastric bypass 01/30/2017   2004 Dr Volanda Napoleon [facility] weight 450 lbs  . Hypertension   . Iron malabsorption 01/30/2017   Hx gastric bypass 2004  . Morbid obesity s/p gastric bypass   . Nonischemic cardiomyopathy ([place])    a. 01/2012 Echo: EF 35-40%, Diff HK, mild MR;  b. 01/2012 R&L Heart Cath: mildly elevated R heart pressures (pcwp 17) , nl cors.  . Osteoarthrosis, unspecified whether generalized or localized, ankle and foot    of the knees,ankle and foot  . Pancytopenia   . Persistent atrial fibrillation ([place])    a. Pradaxa started 01/2012;  b .s/p TEE/DCCV 02/16/2012; c. recurrent afib->Tikosyn Initiation 03/2012  . Sleep apnea    CPAP compliant  . Status post bypass gastrojejunostomy 01/30/2017  . Stroke ([place])   . Unspecified intestinal malabsorption   . Urticaria, unspecified   . Vitamin B 12 deficiency  Past Surgical History:  Procedure Laterality Date  . ATRIAL FIBRILLATION ABLATION N/A 06/25/2012   PVI by Dr [name]  . ATRIAL FIBRILLATION ABLATION N/A 10/04/2017   Procedure: ATRIAL FIBRILLATION ABLATION;  Surgeon: Hillis RangeAllred, James, MD;  [facility]: [facility] IN[facility];  Service: Cardiovascular;  Laterality: N/A;  . CARDIOVERSION  02/16/2012   Procedure: CARDIOVERSION;  Surgeon: Lewayne BuntingBrian S Crenshaw, MD;  [facility];  Service: Cardiovascular;  Laterality: N/A;  . CARDIOVERSION  04/22/2012   Procedure: CARDIOVERSION;  Surgeon: Duke SalviaSteven C Klein, MD;  [facility];  Service: Cardiovascular;  Laterality: N/A;  .  CARDIOVERSION N/A 08/22/2017   Procedure: CARDIOVERSION;  Surgeon: Chilton Siandolph, Tiffany, MD;  [facility];  Service: Cardiovascular;  Laterality: N/A;  . CARDIOVERSION N/A 06/11/2018   Procedure: CARDIOVERSION;  Surgeon: Vesta MixerNahser, Philip J, MD;  [facility];  Service: Cardiovascular;  Laterality: N/A;  . CARDIOVERSION N/A 04/29/2019   Procedure: CARDIOVERSION;  Surgeon: Sande Rives'Neal, Tutwiler Thomas, MD;  [facility];  Service: Endoscopy;  Laterality: N/A;  . COLONOSCOPY N/A 07/07/2016   Procedure: COLONOSCOPY;  Surgeon: Jeani HawkingPatrick Hung, MD;  Location: [facility] ENDOSCOPY;  Service: Endoscopy;  Laterality: N/A;  . ESOPHAGOGASTRODUODENOSCOPY N/A 07/07/2016   Procedure: ESOPHAGOGASTRODUODENOSCOPY (EGD);  Surgeon: Jeani HawkingPatrick Hung, MD;  [facility];  Service: Endoscopy;  Laterality: N/A;  . GASTRIC BYPASS  2004  . HERNIA REPAIR    . LEFT AND RIGHT HEART CATHETERIZATION WITH CORONARY ANGIOGRAM N/A 02/13/2012   Procedure: LEFT AND RIGHT HEART CATHETERIZATION WITH CORONARY ANGIOGRAM;  Surgeon: Tonny BollmanMichael Cooper, MD;  [facility];  Service: Cardiovascular;  Laterality: N/A;  . TEE WITHOUT CARDIOVERSION  02/16/2012   Procedure: TRANSESOPHAGEAL ECHOCARDIOGRAM (TEE);  Surgeon: Lewayne BuntingBrian S Crenshaw, MD;  [facility];  Service: Cardiovascular;  Laterality: N/A;  10a CV  . TEE WITHOUT CARDIOVERSION  06/24/2012   Procedure: TRANSESOPHAGEAL ECHOCARDIOGRAM (TEE);  Surgeon: Peter M SwazilandJordan, MD;  [facility];  Service: Cardiovascular;  Laterality: N/A;  . TEE WITHOUT CARDIOVERSION N/A 08/22/2017   Procedure: TRANSESOPHAGEAL ECHOCARDIOGRAM (TEE);  Surgeon: Chilton Siandolph, Tiffany, MD;  [facility];  Service: Cardiovascular;  Laterality: N/A;  . TEE WITHOUT CARDIOVERSION N/A 06/11/2018   Procedure: TRANSESOPHAGEAL ECHOCARDIOGRAM (TEE);  Surgeon: Elease HashimotoNahser, Deloris PingPhilip J, MD;  [facility];  Service: Cardiovascular;  Laterality: N/A;  . UMBILICAL HERNIA REPAIR     x 2      Current Outpatient Medications  Medication Sig Dispense Refill  . acetaminophen (TYLENOL) 500 MG tablet Take 1,000 mg by mouth every 6 (six) hours as needed (for pain).    Marland Kitchen. amLODipine (NORVASC) 5 MG tablet TAKE ONE TABLET BY MOUTH ONCE DAILY (Patient taking differently: Take 5 mg by mouth every evening. ) 30 tablet 1  . ferrous sulfate 325 (65 FE) MG tablet Take 325 mg by mouth daily with breakfast.    . flecainide (TAMBOCOR) 50 MG tablet Take 1 tablet (50 mg total) by mouth 2 (two) times daily. 180 tablet 3  . folic acid (FOLVITE) 1 MG tablet Take 1 tablet (1 mg total) by mouth daily. (Patient taking differently: Take 1 mg by mouth every evening. ) 100 tablet 3  . furosemide (LASIX) 40 MG tablet Take 20 mg by mouth daily.    . metoprolol tartrate (LOPRESSOR) 25 MG tablet Take 100mg  in the AM and 75mg  in the PM (Patient taking differently: Take 75-100 mg by mouth See admin instructions. TAKE 4 TABLETS (100 MG) BY MOUTH IN THE MORNING & TAKE 3 TABLETS (75 MG) BY  MOUTH IN THE EVENING.) 210 tablet 2  . Multiple Vitamin (MULTIVITAMIN) tablet Take 1-2 tablets by mouth See admin instructions. Take 2 tablets in the morning and 1 tablet in the evening    . omeprazole (PRILOSEC) 40 MG capsule Take 40 mg by mouth daily.   1  . spironolactone (ALDACTONE) 50 MG tablet TAKE ONE TABLET BY MOUTH ONCE DAILY (Patient taking differently: Take 50 mg by mouth daily. ) 30 tablet 1  . Vitamin D, Ergocalciferol, (DRISDOL) 1.25 MG (50000 UT) CAPS capsule Take 50,000 Units by mouth every Tuesday.     . vitamin E (VITAMIN E) 400 UNIT capsule Take 400 Units by mouth daily.    [name] Hurl 20 MG TABS tablet TAKE ONE TABLET BY MOUTH DAILY WITH  SUPPER (Patient taking differently: Take 20 mg by mouth daily with supper. ) 30 tablet 1   No current facility-administered medications for this encounter.    Facility-Administered Medications Ordered in Other Encounters  Medication Dose Route Frequency Provider Last Rate Last Dose   . gadopentetate dimeglumine (MAGNEVIST) injection 20 mL  20 mL Intravenous Once PRN Anson Fret, MD        Allergies:   Patient has no known allergies.   Social History:  The patient  reports that he has never smoked. He quit smokeless tobacco use about 16 years ago.  His smokeless tobacco use included chew. He reports current alcohol use of about 2.0 standard drinks of alcohol per week. He reports that he does not use drugs.   Family History:  The patient's  family history includes Diabetes in his father; Heart attack (age of onset: 69) in his father; Hypertension in his father and mother; Kidney disease in his father; Stroke in his paternal grandmother.    ROS:  Please see the history of present illness.   All other systems are personally reviewed and negative.   Exam: GEN- The patient is well appearing, alert and oriented x 3 today.   Head- normocephalic, atraumatic Eyes-  Sclera clear, conjunctiva pink Ears- hearing intact Oropharynx- clear Neck- supple, no JVP Lymph- no cervical lymphadenopathy Lungs- Clear to ausculation bilaterally, normal work of breathing Heart- irregular rate and rhythm, no murmurs, rubs or gallops, PMI not laterally displaced GI- soft, NT, ND, + BS Extremities- no clubbing, cyanosis, or edema MS- no significant deformity or atrophy Skin- no rash or lesion Psych- euthymic mood, full affect Neuro- strength and sensation are intact  Recent Labs: 04/25/2019: BUN 13; Creatinine, Ser 0.76; Potassium 4.9; Sodium 138 06/11/2019: Hemoglobin 13.6; Platelets 177  personally reviewed    Other studies personally reviewed: Ekg- probable atrial flutter at 123 bpm, pr int 200 ms, qrs int 108 ms, qtc 472 ms    ASSESSMENT AND PLAN:  1. Persistent atrial fibrillation/flutter  s/p 2nd ablation 2019 Recent cardioversion but with ERAF Placed on flecainide 50 mg bid  By Dr. Stefani Dama  from  PCP office 06/04/19 showed SR with  first degree av block and IRBBB,  therefore I did not increase flecainide to 100 mg bid   Unfortunately,  he looks to be back out of rhythm, SR was short lived  Determined in the past not  to be a tikosyn candidate for prolonged qtc. Continue  BB to 100 mg am and 75 mg pm Continue xarelto 20 mg daily, states no missed doses x 3 weeks  This patients CHA2DS2-VASc Score and unadjusted Ischemic Stroke Rate (% per year) is equal to 2.2 % stroke rate/year from  a score of 2 I will go ahead and set up for  elective cardioversion,no missed does of anticoagualtion Cbc/bmet/ covid screening.     Labs/ tests ordered today include: none Orders Placed This Encounter  Procedures  . CBC  . Basic metabolic panel  . EKG 12-Lead    Patient Risk:  after full review of this patients clinical status, I feel that they are at moderate risk at this time.  Signed, [name] NP  06/11/2019 2:20 PM  Afib Clinic [facility] [address] [place], [place] [address] [phone]

## 2019-06-14 ENCOUNTER — Other Ambulatory Visit (HOSPITAL_COMMUNITY)
Admission: RE | Admit: 2019-06-14 | Discharge: 2019-06-14 | Disposition: A | Discharge status: Home / Self Care | Payer: BC Managed Care – PPO | Source: Ambulatory Visit | Attending: Cardiovascular Disease | Admitting: Cardiovascular Disease

## 2019-06-14 DIAGNOSIS — Z20828 Contact with and (suspected) exposure to other viral communicable diseases: Secondary | ICD-10-CM | POA: Insufficient documentation

## 2019-06-14 DIAGNOSIS — Z01812 Encounter for preprocedural laboratory examination: Secondary | ICD-10-CM | POA: Diagnosis not present

## 2019-06-16 LAB — NOVEL CORONAVIRUS, NAA (HOSP ORDER, SEND-OUT TO REF LAB; TAT 18-24 HRS): SARS-CoV-2, NAA: NOT DETECTED

## 2019-06-17 NOTE — Progress Notes (Signed)
Attempted pre op call, no answer, LVM

## 2019-06-18 ENCOUNTER — Ambulatory Visit (HOSPITAL_COMMUNITY)
Admission: RE | Admit: 2019-06-18 | Discharge: 2019-06-18 | Disposition: A | Discharge status: Home / Self Care | Payer: BC Managed Care – PPO | Source: Ambulatory Visit | Attending: Nurse Practitioner | Admitting: Nurse Practitioner

## 2019-06-18 ENCOUNTER — Ambulatory Visit (HOSPITAL_COMMUNITY): Payer: BC Managed Care – PPO | Admitting: Anesthesiology

## 2019-06-18 ENCOUNTER — Encounter (HOSPITAL_COMMUNITY)
Admission: RE | Disposition: A | Discharge status: Home / Self Care | Payer: BC Managed Care – PPO | Source: Home / Self Care | Attending: Cardiovascular Disease

## 2019-06-18 ENCOUNTER — Other Ambulatory Visit: Payer: Self-pay

## 2019-06-18 ENCOUNTER — Ambulatory Visit (HOSPITAL_COMMUNITY)
Admission: RE | Admit: 2019-06-18 | Discharge: 2019-06-18 | Disposition: A | Discharge status: Home / Self Care | Payer: BC Managed Care – PPO | Attending: Cardiovascular Disease | Admitting: Cardiovascular Disease

## 2019-06-18 ENCOUNTER — Encounter (HOSPITAL_COMMUNITY): Payer: Self-pay | Admitting: Cardiovascular Disease

## 2019-06-18 VITALS — HR 104

## 2019-06-18 DIAGNOSIS — Z79899 Other long term (current) drug therapy: Secondary | ICD-10-CM | POA: Insufficient documentation

## 2019-06-18 DIAGNOSIS — G610 Guillain-Barre syndrome: Secondary | ICD-10-CM | POA: Insufficient documentation

## 2019-06-18 DIAGNOSIS — Z8673 Personal history of transient ischemic attack (TIA), and cerebral infarction without residual deficits: Secondary | ICD-10-CM | POA: Insufficient documentation

## 2019-06-18 DIAGNOSIS — I428 Other cardiomyopathies: Secondary | ICD-10-CM | POA: Insufficient documentation

## 2019-06-18 DIAGNOSIS — K219 Gastro-esophageal reflux disease without esophagitis: Secondary | ICD-10-CM | POA: Insufficient documentation

## 2019-06-18 DIAGNOSIS — I110 Hypertensive heart disease with heart failure: Secondary | ICD-10-CM | POA: Diagnosis not present

## 2019-06-18 DIAGNOSIS — Z7901 Long term (current) use of anticoagulants: Secondary | ICD-10-CM | POA: Diagnosis not present

## 2019-06-18 DIAGNOSIS — M1990 Unspecified osteoarthritis, unspecified site: Secondary | ICD-10-CM | POA: Insufficient documentation

## 2019-06-18 DIAGNOSIS — G4733 Obstructive sleep apnea (adult) (pediatric): Secondary | ICD-10-CM | POA: Diagnosis not present

## 2019-06-18 DIAGNOSIS — Z9884 Bariatric surgery status: Secondary | ICD-10-CM | POA: Diagnosis not present

## 2019-06-18 DIAGNOSIS — D509 Iron deficiency anemia, unspecified: Secondary | ICD-10-CM | POA: Diagnosis not present

## 2019-06-18 DIAGNOSIS — I484 Atypical atrial flutter: Secondary | ICD-10-CM

## 2019-06-18 DIAGNOSIS — I5042 Chronic combined systolic (congestive) and diastolic (congestive) heart failure: Secondary | ICD-10-CM | POA: Diagnosis not present

## 2019-06-18 DIAGNOSIS — I639 Cerebral infarction, unspecified: Secondary | ICD-10-CM | POA: Diagnosis not present

## 2019-06-18 DIAGNOSIS — I4891 Unspecified atrial fibrillation: Secondary | ICD-10-CM | POA: Diagnosis not present

## 2019-06-18 DIAGNOSIS — I4819 Other persistent atrial fibrillation: Secondary | ICD-10-CM | POA: Insufficient documentation

## 2019-06-18 DIAGNOSIS — I10 Essential (primary) hypertension: Secondary | ICD-10-CM | POA: Diagnosis not present

## 2019-06-18 HISTORY — PX: CARDIOVERSION: SHX1299

## 2019-06-18 LAB — POCT I-STAT, CHEM 8
BUN: 9 mg/dL (ref 6–20)
Calcium, Ion: 1.12 mmol/L — ABNORMAL LOW (ref 1.15–1.40)
Chloride: 102 mmol/L (ref 98–111)
Creatinine, Ser: 0.7 mg/dL (ref 0.61–1.24)
Glucose, Bld: 91 mg/dL (ref 70–99)
HCT: 40 % (ref 39.0–52.0)
Hemoglobin: 13.6 g/dL (ref 13.0–17.0)
Potassium: 4.3 mmol/L (ref 3.5–5.1)
Sodium: 138 mmol/L (ref 135–145)
TCO2: 25 mmol/L (ref 22–32)

## 2019-06-18 SURGERY — CARDIOVERSION
Anesthesia: General

## 2019-06-18 MED ORDER — PROPOFOL 10 MG/ML IV BOLUS
INTRAVENOUS | Status: DC | PRN
  Administered 2019-06-18: 100 mg via INTRAVENOUS

## 2019-06-18 MED ORDER — LIDOCAINE 2% (20 MG/ML) 5 ML SYRINGE
INTRAMUSCULAR | Status: DC | PRN
  Administered 2019-06-18: 100 mg via INTRAVENOUS

## 2019-06-18 MED ORDER — SODIUM CHLORIDE 0.9 % IV SOLN
INTRAVENOUS | Status: DC
  Administered 2019-06-18: 10:00:00 via INTRAVENOUS

## 2019-06-18 NOTE — Anesthesia Preprocedure Evaluation (Addendum)
Anesthesia Evaluation  Patient identified by MRN, date of birth, ID band Patient awake    Reviewed: Allergy & Precautions, H&P , NPO status , Patient's Chart, lab work & pertinent test results  History of Anesthesia Complications Negative for: history of anesthetic complications  Airway Mallampati: II   Neck ROM: full    Dental no notable dental hx. (+) Dental Advisory Given   Pulmonary sleep apnea ,    breath sounds clear to auscultation       Cardiovascular hypertension, Pt. on home beta blockers + Peripheral Vascular Disease  + dysrhythmias Atrial Fibrillation  Rhythm:irregular Rate:Normal  Study Conclusions  - Left ventricle: Systolic function was mildly reduced. The   estimated ejection fraction was in the range of 40% to 45%. No   evidence of thrombus. - Mitral valve: There was mild regurgitation. - Left atrium: No evidence of thrombus in the atrial cavity or   appendage.   Neuro/Psych H/o Guillan-Barre  Neuromuscular disease CVA negative psych ROS   GI/Hepatic Neg liver ROS, GERD  ,  Endo/Other  negative endocrine ROS  Renal/GU negative Renal ROS     Musculoskeletal  (+) Arthritis ,   Abdominal   Peds  Hematology   Anesthesia Other Findings   Reproductive/Obstetrics                            Anesthesia Physical  Anesthesia Plan  ASA: III  Anesthesia Plan: General   Post-op Pain Management:    Induction: Intravenous  PONV Risk Score and Plan: 2 and Ondansetron, Treatment may vary due to age or medical condition and Propofol infusion  Airway Management Planned: Natural Airway, Simple Face Mask and Mask  Additional Equipment:   Intra-op Plan:   Post-operative Plan:   Informed Consent: I have reviewed the patients History and Physical, chart, labs and discussed the procedure including the risks, benefits and alternatives for the proposed anesthesia with the patient  or authorized representative who has indicated his/her understanding and acceptance.     Dental advisory given  Plan Discussed with: CRNA, Anesthesiologist and Surgeon  Anesthesia Plan Comments:        Anesthesia Quick Evaluation

## 2019-06-18 NOTE — Transfer of Care (Signed)
Immediate Anesthesia Transfer of Care Note  Patient: Tyrone [name]  Procedure(s) Performed: CARDIOVERSION (N/A )  Patient Location: PACU and Endoscopy Unit  Anesthesia Type:General  Level of Consciousness: awake, alert , oriented and patient cooperative  Airway & Oxygen Therapy: Patient Spontanous Breathing and Patient connected to nasal cannula oxygen  Post-op Assessment: Report given to RN and Post -op Vital signs reviewed and stable  Post vital signs: Reviewed and stable  Last Vitals:  Vitals Value Taken Time  BP    Temp    Pulse    Resp    SpO2      Last Pain:  Vitals:   06/18/19 0938  TempSrc: Temporal  PainSc: 0-No pain         Complications: No apparent anesthesia complications

## 2019-06-18 NOTE — Anesthesia Postprocedure Evaluation (Signed)
Anesthesia Post Note  Patient: Tyrone [name]  Procedure(s) Performed: CARDIOVERSION (N/A )     Patient location during evaluation: PACU Anesthesia Type: General Level of consciousness: sedated Pain management: pain level controlled Vital Signs Assessment: post-procedure vital signs reviewed and stable Respiratory status: spontaneous breathing and respiratory function stable Cardiovascular status: stable Postop Assessment: no apparent nausea or vomiting Anesthetic complications: no    Last Vitals:  Vitals:   06/18/19 1020 06/18/19 1025  BP:  (!) 139/102  Pulse: 71 78  Resp: 20 19  Temp:    SpO2: 100% 99%    Last Pain:  Vitals:   06/18/19 1025  TempSrc:   PainSc: 0-No pain                 Arvell Pulsifer DANIEL

## 2019-06-18 NOTE — H&P (View-Only) (Signed)
Pt has DCCV scheduled for this am. He wanted to confirm that he is still in afib. EKg shows aflutter with variable block at 104 bpm. to DCCV as planned.  

## 2019-06-18 NOTE — CV Procedure (Signed)
Electrical Cardioversion Procedure Note OWYN RAULSTON [id number] 1965-12-08  Procedure: Electrical Cardioversion Indications:  Atrial Flutter  Procedure Details Consent: Risks of procedure as well as the alternatives and risks of each were explained to the (patient/caregiver).  Consent for procedure obtained. Time Out: Verified patient identification, verified procedure, site/side was marked, verified correct patient position, special equipment/implants available, medications/allergies/relevent history reviewed, required imaging and test results available.  Performed  Patient placed on cardiac monitor, pulse oximetry, supplemental oxygen as necessary.  Sedation given: propofol Pacer pads placed anterior and posterior chest.  Cardioverted 1 time(s).  Cardioverted at [place].  Evaluation Findings: Post procedure EKG shows: NSR Complications: None Patient did tolerate procedure well.   Skeet Latch, MD 06/18/2019, 9:59 AM

## 2019-06-18 NOTE — Progress Notes (Signed)
Pt has DCCV scheduled for this am. He wanted to confirm that he is still in afib. EKg shows aflutter with variable block at 104 bpm. to DCCV as planned.

## 2019-06-18 NOTE — Interval H&P Note (Signed)
History and Physical Interval Note:  06/18/2019 9:53 AM  Shela Leff III  has presented today for surgery, with the diagnosis of AFIB.  The various methods of treatment have been discussed with the patient and family. After consideration of risks, benefits and other options for treatment, the patient has consented to  Procedure(s): CARDIOVERSION (N/A) as a surgical intervention.  The patient's history has been reviewed, patient examined, no change in status, stable for surgery.  I have reviewed the patient's chart and labs.  Questions were answered to the patient's satisfaction.     Skeet Latch, MD

## 2019-06-19 ENCOUNTER — Encounter (HOSPITAL_COMMUNITY): Payer: Self-pay | Admitting: Cardiovascular Disease

## 2019-07-09 ENCOUNTER — Encounter: Payer: Self-pay | Admitting: Internal Medicine

## 2019-07-09 ENCOUNTER — Telehealth (INDEPENDENT_AMBULATORY_CARE_PROVIDER_SITE_OTHER): Payer: BC Managed Care – PPO | Admitting: Internal Medicine

## 2019-07-09 VITALS — BP 124/82 | HR 65 | Ht 66.0 in | Wt 208.0 lb

## 2019-07-09 DIAGNOSIS — I484 Atypical atrial flutter: Secondary | ICD-10-CM

## 2019-07-09 DIAGNOSIS — I4819 Other persistent atrial fibrillation: Secondary | ICD-10-CM

## 2019-07-09 DIAGNOSIS — I10 Essential (primary) hypertension: Secondary | ICD-10-CM

## 2019-07-09 DIAGNOSIS — I428 Other cardiomyopathies: Secondary | ICD-10-CM

## 2019-07-09 DIAGNOSIS — D6869 Other thrombophilia: Secondary | ICD-10-CM

## 2019-07-09 MED ORDER — METOPROLOL TARTRATE 25 MG PO TABS: 100.0000 mg | ORAL_TABLET | Freq: Two times a day (BID) | ORAL | 3 refills | Status: DC

## 2019-07-09 NOTE — Progress Notes (Signed)
Electrophysiology TeleHealth Note   Due to national recommendations of social distancing due to COVID 19, an audio/video telehealth visit is felt to be most appropriate for this patient at this time.  See MyChart message from today for the patient's consent to telehealth for [facility].   Date:  07/09/2019   ID:  Tyrone [name], DOB 1965-11-08, MRN [id number]  Location: patient's home  Provider location:  [facility]  Evaluation Performed: Follow-up visit  PCP:  Shirlean Mylar, MD   Electrophysiologist:  Dr [name]  Chief Complaint:  palpitations  History of Present Illness:    Tyrone [name] is a 53 y.o. male who presents via telehealth conferencing today.  Since last being seen in our clinic, the patient reports doing very well.  He did require cardioversion in November.  He is working on reduced job related stress. Today, he denies symptoms of palpitations, chest pain, shortness of breath,  lower extremity edema, dizziness, presyncope, or syncope.  The patient is otherwise without complaint today.  The patient denies symptoms of fevers, chills, cough, or new SOB worrisome for COVID 19.  Past Medical History:  Diagnosis Date  . Anemia, iron deficiency 01/30/2017  . Cervicalgia   . CVA (cerebral infarction) 05/01/12   on pradaxa at time of stroke, switched to xarelto  . External thrombosed hemorrhoids   . GERD (gastroesophageal reflux disease)   . Guillain Barr syndrome [facility])    following seasonal influenza vaccination  . Guillain-Barre syndrome (HCC)    from stroke in 2013  . Hematuria    a. 01/2012 - to f/u with urology as outpt.  Marland Kitchen Hernia of unspecified site of abdominal cavity without mention of obstruction or gangrene    of abdominal  . Hx of gastric bypass 01/30/2017   2004 Dr Clent Ridges [facility] weight 450 lbs  . Hypertension   . Iron malabsorption 01/30/2017   Hx gastric bypass 2004  . Morbid obesity s/p gastric bypass   . Nonischemic cardiomyopathy  (HCC)    a. 01/2012 Echo: EF 35-40%, Diff HK, mild MR;  b. 01/2012 R&L Heart Cath: mildly elevated R heart pressures (pcwp 17) , nl cors.  . Osteoarthrosis, unspecified whether generalized or localized, ankle and foot    of the knees,ankle and foot  . Pancytopenia   . Persistent atrial fibrillation (HCC)    a. Pradaxa started 01/2012;  b .s/p TEE/DCCV 02/16/2012; c. recurrent afib->Tikosyn Initiation 03/2012  . Sleep apnea    CPAP compliant  . Status post bypass gastrojejunostomy 01/30/2017  . Stroke (HCC)   . Unspecified intestinal malabsorption   . Urticaria, unspecified   . Vitamin B 12 deficiency     Past Surgical History:  Procedure Laterality Date  . ATRIAL FIBRILLATION ABLATION N/A 06/25/2012   PVI by Dr [name]  . ATRIAL FIBRILLATION ABLATION N/A 10/04/2017   Procedure: ATRIAL FIBRILLATION ABLATION;  Surgeon: Hillis Range, MD;  [facility] INVAS[facility] CV LAB;  Service: Cardiovascular;  Laterality: N/A;  . CARDIOVERSION  02/16/2012   Procedure: CARDIOVERSION;  Surgeon: [name], MD;  Location: [facility] ENDOSCOPY;  Service: Cardiovascular;  Laterality: N/A;  . CARDIOVERSION  04/22/2012   Procedure: CARDIOVERSION;  Surgeon: [name], MD;  Location: [facility] ENDOSCOPY;  Service: Cardiovascular;  Laterality: N/A;  . CARDIOVERSION N/A 08/22/2017   Procedure: CARDIOVERSION;  Surgeon: Chilton Si, MD;  Location: [facility] ENDOSCOPY;  Service: Cardiovascular;  Laterality: N/A;  . CARDIOVERSION N/A 06/11/2018   Procedure: CARDIOVERSION;  Surgeon:  Nahser, Deloris Ping, MD;  Location: [facility] ENDOSCOPY;  Service: Cardiovascular;  Laterality: N/A;  . CARDIOVERSION N/A 04/29/2019   Procedure: CARDIOVERSION;  Surgeon: Sande Rives, MD;  Location: [facility] ENDOSCOPY;  Service: Endoscopy;  Laterality: N/A;  . CARDIOVERSION N/A 06/18/2019   Procedure: CARDIOVERSION;  Surgeon: Chilton Si, MD;  Location: [facility] ENDOSCOPY;  Service: Cardiovascular;  Laterality: N/A;  . COLONOSCOPY N/A 07/07/2016   Procedure:  COLONOSCOPY;  Surgeon: Jeani Hawking, MD;  Location: [facility] ENDOSCOPY;  Service: Endoscopy;  Laterality: N/A;  . ESOPHAGOGASTRODUODENOSCOPY N/A 07/07/2016   Procedure: ESOPHAGOGASTRODUODENOSCOPY (EGD);  Surgeon: Jeani Hawking, MD;  Location: [facility] ENDOSCOPY;  Service: Endoscopy;  Laterality: N/A;  . GASTRIC BYPASS  2004  . HERNIA REPAIR    . LEFT AND RIGHT HEART CATHETERIZATION WITH CORONARY ANGIOGRAM N/A 02/13/2012   Procedure: LEFT AND RIGHT HEART CATHETERIZATION WITH CORONARY ANGIOGRAM;  Surgeon: Tonny Bollman, MD;  Location: [facility] CATH LAB;  Service: Cardiovascular;  Laterality: N/A;  . TEE WITHOUT CARDIOVERSION  02/16/2012   Procedure: TRANSESOPHAGEAL ECHOCARDIOGRAM (TEE);  Surgeon: [name], MD;  Location: [facility] ENDOSCOPY;  Service: Cardiovascular;  Laterality: N/A;  10a CV  . TEE WITHOUT CARDIOVERSION  06/24/2012   Procedure: TRANSESOPHAGEAL ECHOCARDIOGRAM (TEE);  Surgeon: [name], MD;  Location: [facility] ENDOSCOPY;  Service: Cardiovascular;  Laterality: N/A;  . TEE WITHOUT CARDIOVERSION N/A 08/22/2017   Procedure: TRANSESOPHAGEAL ECHOCARDIOGRAM (TEE);  Surgeon: Chilton Si, MD;  Location: [facility] ENDOSCOPY;  Service: Cardiovascular;  Laterality: N/A;  . TEE WITHOUT CARDIOVERSION N/A 06/11/2018   Procedure: TRANSESOPHAGEAL ECHOCARDIOGRAM (TEE);  Surgeon: Elease Hashimoto Deloris Ping, MD;  Location: [facility] ENDOSCOPY;  Service: Cardiovascular;  Laterality: N/A;  . UMBILICAL HERNIA REPAIR     x 2    Current Outpatient Medications  Medication Sig Dispense Refill  . acetaminophen (TYLENOL) 500 MG tablet Take 1,000 mg by mouth every 6 (six) hours as needed (for pain).    Marland Kitchen amLODipine (NORVASC) 5 MG tablet TAKE ONE TABLET BY MOUTH ONCE DAILY (Patient taking differently: Take 5 mg by mouth every evening. ) 30 tablet 1  . ferrous sulfate 325 (65 FE) MG tablet Take 325 mg by mouth daily.     . flecainide (TAMBOCOR) 50 MG tablet Take 1 tablet (50 mg total) by mouth 2 (two) times daily. 180 tablet 3  . folic acid  (FOLVITE) 1 MG tablet Take 1 tablet (1 mg total) by mouth daily. (Patient taking differently: Take 1 mg by mouth every evening. ) 100 tablet 3  . furosemide (LASIX) 40 MG tablet Take 20 mg by mouth daily.    . Multiple Vitamin (MULTIVITAMIN) tablet Take 1-2 tablets by mouth See admin instructions. Take 2 tablets in the morning and 1 tablet in the evening    . omeprazole (PRILOSEC) 40 MG capsule Take 40 mg by mouth daily.   1  . spironolactone (ALDACTONE) 50 MG tablet TAKE ONE TABLET BY MOUTH ONCE DAILY (Patient taking differently: Take 50 mg by mouth daily. ) 30 tablet 1  . vitamin E (VITAMIN E) 400 UNIT capsule Take 400 Units by mouth daily.    [name] Hurl 20 MG TABS tablet TAKE ONE TABLET BY MOUTH DAILY WITH  SUPPER (Patient taking differently: Take 20 mg by mouth daily with supper. ) 30 tablet 1   No current facility-administered medications for this visit.   Facility-Administered Medications Ordered in Other Visits  Medication Dose Route Frequency Provider Last Rate Last Admin  . gadopentetate dimeglumine (MAGNEVIST) injection 20 mL  20 mL Intravenous Once  PRN Melvenia Beam, MD       Allergies:   Patient has no known allergies.   Social History:  The patient  reports that he has never smoked. He quit smokeless tobacco use about 16 years ago.  His smokeless tobacco use included chew. He reports current alcohol use of about 2.0 standard drinks of alcohol per week. He reports that he does not use drugs.   Family History:  The patient's family history includes Diabetes in his father; Heart attack (age of onset: 44) in his father; Hypertension in his father and mother; Kidney disease in his father; Stroke in his paternal grandmother.   ROS:  Please see the history of present illness.   All other systems are personally reviewed and negative.    Exam:    Vital Signs:  BP 124/82   Pulse 65   Ht 5\' 6"  (1.676 m)   Wt 208 lb (94.3 kg)   BMI 33.57 kg/m   Well sounding and appearing, alert  and conversant, regular work of breathing,  good skin color Eyes- anicteric, neuro- grossly intact, skin- no apparent rash or lesions or cyanosis, mouth- oral mucosa is pink  Labs/Other Tests and Data Reviewed:    Recent Labs: 06/11/2019: Platelets 177 06/18/2019: BUN 9; Creatinine, Ser 0.70; Hemoglobin 13.6; Potassium 4.3; Sodium 138   Wt Readings from Last 3 Encounters:  07/09/19 208 lb (94.3 kg)  06/18/19 214 lb 12.8 oz (97.4 kg)  06/11/19 214 lb 12.8 oz (97.4 kg)     ASSESSMENT & PLAN:    1.  Persistent afib/ atypical atrial flutter S/p ablation x 2 Not a candidate to tikosyn due to prior qt prolongation Continue flecainide.  We could increase flecainide if needed to 100mg  bid Continue xarelto for chads2vasc score of 4, including prior stroke  2. HTN Stable No change required today  3. nonischeimc CM Improved with sinus Echo is pending  Follow-up:  3 months with me   Patient Risk:  after full review of this patients clinical status, I feel that they are at moderate risk at this time.  Today, I have spent 15 minutes with the patient with telehealth technology discussing arrhythmia management .    Army Fossa, MD  07/09/2019 12:20 PM     [place] [address] [phone] (office) [phone] (fax)

## 2019-07-11 DIAGNOSIS — G4733 Obstructive sleep apnea (adult) (pediatric): Secondary | ICD-10-CM | POA: Diagnosis not present

## 2019-10-08 ENCOUNTER — Telehealth: Payer: Self-pay | Admitting: *Deleted

## 2019-10-08 ENCOUNTER — Encounter: Payer: Self-pay | Admitting: Internal Medicine

## 2019-10-08 ENCOUNTER — Other Ambulatory Visit: Payer: Self-pay

## 2019-10-08 ENCOUNTER — Telehealth (INDEPENDENT_AMBULATORY_CARE_PROVIDER_SITE_OTHER): Payer: BC Managed Care – PPO | Admitting: Internal Medicine

## 2019-10-08 VITALS — BP 109/70 | HR 60 | Ht 66.0 in | Wt 211.0 lb

## 2019-10-08 DIAGNOSIS — I119 Hypertensive heart disease without heart failure: Secondary | ICD-10-CM | POA: Diagnosis not present

## 2019-10-08 DIAGNOSIS — I4819 Other persistent atrial fibrillation: Secondary | ICD-10-CM | POA: Diagnosis not present

## 2019-10-08 DIAGNOSIS — I428 Other cardiomyopathies: Secondary | ICD-10-CM | POA: Diagnosis not present

## 2019-10-08 DIAGNOSIS — I5043 Acute on chronic combined systolic (congestive) and diastolic (congestive) heart failure: Secondary | ICD-10-CM

## 2019-10-08 DIAGNOSIS — I5042 Chronic combined systolic (congestive) and diastolic (congestive) heart failure: Secondary | ICD-10-CM

## 2019-10-08 DIAGNOSIS — D6869 Other thrombophilia: Secondary | ICD-10-CM

## 2019-10-08 NOTE — Progress Notes (Signed)
Electrophysiology TeleHealth Note   Due to national recommendations of social distancing due to COVID 19, an audio/video telehealth visit is felt to be most appropriate for this patient at this time.  See MyChart message from today for the patient's consent to telehealth for [name]  Date:  10/08/2019   ID:  Tyrone [name], DOB 10-15-1965, MRN [id number]  Location: patient's home  Provider location:  [place]  Evaluation Performed: Follow-up visit  PCP:  Maurice Small, MD   Electrophysiologist:  Dr Rayann Heman  Chief Complaint:  palpitations  History of Present Illness:    Tyrone [name] is a 54 y.o. male who presents via telehealth conferencing today.  Since last being seen in our clinic, the patient reports doing very well.  No recent afib.  Today, he denies symptoms of palpitations, chest pain, shortness of breath,  lower extremity edema, dizziness, presyncope, or syncope.  The patient is otherwise without complaint today.    Past Medical History:  Diagnosis Date  . Anemia, iron deficiency 01/30/2017  . Cervicalgia   . CVA (cerebral infarction) 05/01/12   on pradaxa at time of stroke, switched to [place]  . External thrombosed hemorrhoids   . GERD (gastroesophageal reflux disease)   . Guillain Barr syndrome [facility])    following seasonal influenza vaccination  . Guillain-Barre syndrome ([place])    from stroke in 2013  . Hematuria    a. 01/2012 - to f/u with urology as outpt.  Marland Kitchen Hernia of unspecified site of abdominal cavity without mention of obstruction or gangrene    of abdominal  . Hx of gastric bypass 01/30/2017   2004 Dr Volanda Napoleon [facility] weight 450 lbs  . Hypertension   . Iron malabsorption 01/30/2017   Hx gastric bypass 2004  . Morbid obesity s/p gastric bypass   . Nonischemic cardiomyopathy ([place])    a. 01/2012 Echo: EF 35-40%, Diff HK, mild MR;  b. 01/2012 R&L Heart Cath: mildly elevated R heart pressures (pcwp 17) , nl cors.  . Osteoarthrosis,  unspecified whether generalized or localized, ankle and foot    of the knees,ankle and foot  . Pancytopenia   . Persistent atrial fibrillation ([place])    a. Pradaxa started 01/2012;  b .s/p TEE/DCCV 02/16/2012; c. recurrent afib->Tikosyn Initiation 03/2012  . Sleep apnea    CPAP compliant  . Status post bypass gastrojejunostomy 01/30/2017  . Stroke ([place])   . Unspecified intestinal malabsorption   . Urticaria, unspecified   . Vitamin B 12 deficiency     Past Surgical History:  Procedure Laterality Date  . ATRIAL FIBRILLATION ABLATION N/A 06/25/2012   PVI by Dr Rayann Heman  . ATRIAL FIBRILLATION ABLATION N/A 10/04/2017   Procedure: ATRIAL FIBRILLATION ABLATION;  Surgeon: Thompson Grayer, MD;  Location: [facility] CV LAB;  Service: Cardiovascular;  Laterality: N/A;  . CARDIOVERSION  02/16/2012   Procedure: CARDIOVERSION;  Surgeon: Lelon Perla, MD;  Location: [facility] ENDOSCOPY;  Service: Cardiovascular;  Laterality: N/A;  . CARDIOVERSION  04/22/2012   Procedure: CARDIOVERSION;  Surgeon: Deboraha Sprang, MD;  Location: [facility] ENDOSCOPY;  Service: Cardiovascular;  Laterality: N/A;  . CARDIOVERSION N/A 08/22/2017   Procedure: CARDIOVERSION;  Surgeon: Skeet Latch, MD;  Location: [facility] ENDOSCOPY;  Service: Cardiovascular;  Laterality: N/A;  . CARDIOVERSION N/A 06/11/2018   Procedure: CARDIOVERSION;  Surgeon: Thayer Headings, MD;  Location: [facility] ENDOSCOPY;  Service: Cardiovascular;  Laterality: N/A;  . CARDIOVERSION N/A 04/29/2019   Procedure: CARDIOVERSION;  Surgeon: O'Neal,  Ronnald Ramp, MD;  Location: [facility] ENDOSCOPY;  Service: Endoscopy;  Laterality: N/A;  . CARDIOVERSION N/A 06/18/2019   Procedure: CARDIOVERSION;  Surgeon: Chilton Si, MD;  Location: [facility] ENDOSCOPY;  Service: Cardiovascular;  Laterality: N/A;  . COLONOSCOPY N/A 07/07/2016   Procedure: COLONOSCOPY;  Surgeon: Jeani Hawking, MD;  Location: [facility] ENDOSCOPY;  Service: Endoscopy;  Laterality: N/A;  . ESOPHAGOGASTRODUODENOSCOPY N/A 07/07/2016    Procedure: ESOPHAGOGASTRODUODENOSCOPY (EGD);  Surgeon: Jeani Hawking, MD;  Location: [facility] ENDOSCOPY;  Service: Endoscopy;  Laterality: N/A;  . GASTRIC BYPASS  2004  . HERNIA REPAIR    . LEFT AND RIGHT HEART CATHETERIZATION WITH CORONARY ANGIOGRAM N/A 02/13/2012   Procedure: LEFT AND RIGHT HEART CATHETERIZATION WITH CORONARY ANGIOGRAM;  Surgeon: Tonny Bollman, MD;  Location: [facility] CATH LAB;  Service: Cardiovascular;  Laterality: N/A;  . TEE WITHOUT CARDIOVERSION  02/16/2012   Procedure: TRANSESOPHAGEAL ECHOCARDIOGRAM (TEE);  Surgeon: [name], MD;  Location: [facility] ENDOSCOPY;  Service: Cardiovascular;  Laterality: N/A;  10a CV  . TEE WITHOUT CARDIOVERSION  06/24/2012   Procedure: TRANSESOPHAGEAL ECHOCARDIOGRAM (TEE);  Surgeon: [name], MD;  Location: [facility] ENDOSCOPY;  Service: Cardiovascular;  Laterality: N/A;  . TEE WITHOUT CARDIOVERSION N/A 08/22/2017   Procedure: TRANSESOPHAGEAL ECHOCARDIOGRAM (TEE);  Surgeon: Chilton Si, MD;  Location: [facility] ENDOSCOPY;  Service: Cardiovascular;  Laterality: N/A;  . TEE WITHOUT CARDIOVERSION N/A 06/11/2018   Procedure: TRANSESOPHAGEAL ECHOCARDIOGRAM (TEE);  Surgeon: Elease Hashimoto Deloris Ping, MD;  Location: [facility] ENDOSCOPY;  Service: Cardiovascular;  Laterality: N/A;  . UMBILICAL HERNIA REPAIR     x 2    Current Outpatient Medications  Medication Sig Dispense Refill  . acetaminophen (TYLENOL) 500 MG tablet Take 1,000 mg by mouth every 6 (six) hours as needed (for pain).    Marland Kitchen amLODipine (NORVASC) 5 MG tablet TAKE ONE TABLET BY MOUTH ONCE DAILY 30 tablet 1  . ferrous sulfate 325 (65 FE) MG tablet Take 325 mg by mouth daily.     . flecainide (TAMBOCOR) 50 MG tablet Take 1 tablet (50 mg total) by mouth 2 (two) times daily. 180 tablet 3  . furosemide (LASIX) 40 MG tablet Take 20 mg by mouth daily.    . metoprolol tartrate (LOPRESSOR) 25 MG tablet Take 4 tablets (100 mg total) by mouth 2 (two) times daily. Take 100 mg in the AM and 75 mg in the PM 180 tablet 3  .  Multiple Vitamin (MULTIVITAMIN) tablet Take 1-2 tablets by mouth See admin instructions. Take 2 tablets in the morning and 1 tablet in the evening    . omeprazole (PRILOSEC) 40 MG capsule Take 40 mg by mouth daily.   1  . spironolactone (ALDACTONE) 50 MG tablet TAKE ONE TABLET BY MOUTH ONCE DAILY 30 tablet 1  . vitamin E (VITAMIN E) 400 UNIT capsule Take 400 Units by mouth daily.    [name] Hurl 20 MG TABS tablet TAKE ONE TABLET BY MOUTH DAILY WITH  SUPPER 30 tablet 1   No current facility-administered medications for this visit.   Facility-Administered Medications Ordered in Other Visits  Medication Dose Route Frequency Provider Last Rate Last Admin  . gadopentetate dimeglumine (MAGNEVIST) injection 20 mL  20 mL Intravenous Once PRN Anson Fret, MD        Allergies:   Patient has no known allergies.   Social History:  The patient  reports that he has never smoked. He quit smokeless tobacco use about 16 years ago.  His smokeless tobacco use included chew. He reports current alcohol use  of about 2.0 standard drinks of alcohol per week. He reports that he does not use drugs.   ROS:  Please see the history of present illness.   All other systems are personally reviewed and negative.    Exam:    Vital Signs:  BP 109/70   Pulse 60   Ht 5\' 6"  (1.676 m)   Wt 211 lb (95.7 kg)   BMI 34.06 kg/m   Well sounding and appearing, alert and conversant, regular work of breathing,  good skin color Eyes- anicteric, neuro- grossly intact, skin- no apparent rash or lesions or cyanosis, mouth- oral mucosa is pink  Labs/Other Tests and Data Reviewed:    Recent Labs: 06/11/2019: Platelets 177 06/18/2019: BUN 9; Creatinine, Ser 0.70; Hemoglobin 13.6; Potassium 4.3; Sodium 138   Wt Readings from Last 3 Encounters:  10/08/19 211 lb (95.7 kg)  07/09/19 208 lb (94.3 kg)  06/18/19 214 lb 12.8 oz (97.4 kg)     ASSESSMENT & PLAN:    1.  Persistent afib/ atypical atrial flutter Well controlled  currently s/p ablation x 2, he is on low dose flecainide and requires periodic evaluation to avoid toxicity with this medicine. Not a candidate for tikosyn due to prior qt prolongation ekg 05/18/19 is reviewed Overdue for echo which I have reordered today Continue xarelto given prior stroke  2. Nonischemic CM Previously due to AF Repeat echo to reassess at this time  3. Hypertensive cardiovascular disease Stable No change required today   Follow-up:  6 months with me   Patient Risk:  after full review of this patients clinical status, I feel that they are at moderate risk at this time.  Today, I have spent 15 minutes with the patient with telehealth technology discussing arrhythmia management .    05/20/19, MD  10/08/2019 10:59 AM     [facility] [address] [place] [phone] (office) [phone] (fax)

## 2019-10-08 NOTE — Telephone Encounter (Signed)
-----   Message from Hillis Range, MD sent at 10/08/2019 11:03 AM EDT ----- Re order echo to evaluate his CHF. He prefers to have it done at [facility] 6 months with me

## 2019-10-17 ENCOUNTER — Ambulatory Visit (HOSPITAL_COMMUNITY): Payer: BC Managed Care – PPO

## 2019-10-29 ENCOUNTER — Other Ambulatory Visit: Payer: Self-pay | Admitting: Internal Medicine

## 2019-11-03 ENCOUNTER — Ambulatory Visit (HOSPITAL_COMMUNITY)
Admission: RE | Admit: 2019-11-03 | Discharge: 2019-11-03 | Disposition: A | Discharge status: Home / Self Care | Payer: BC Managed Care – PPO | Source: Ambulatory Visit | Attending: Internal Medicine | Admitting: Internal Medicine

## 2019-11-03 ENCOUNTER — Other Ambulatory Visit: Payer: Self-pay

## 2019-11-03 DIAGNOSIS — I5042 Chronic combined systolic (congestive) and diastolic (congestive) heart failure: Secondary | ICD-10-CM

## 2019-11-03 NOTE — Progress Notes (Signed)
*  PRELIMINARY RESULTS* Echocardiogram 2D Echocardiogram has been performed.  Stacey Drain 11/03/2019, 11:17 AM

## 2019-11-05 ENCOUNTER — Telehealth: Payer: Self-pay | Admitting: Internal Medicine

## 2019-11-05 NOTE — Telephone Encounter (Signed)
Pt called see result note.  

## 2019-11-05 NOTE — Telephone Encounter (Signed)
Veto is returning Ann's call in regards to echo results.

## 2019-11-25 DIAGNOSIS — H2513 Age-related nuclear cataract, bilateral: Secondary | ICD-10-CM | POA: Diagnosis not present

## 2019-11-25 DIAGNOSIS — H538 Other visual disturbances: Secondary | ICD-10-CM | POA: Diagnosis not present

## 2019-11-25 DIAGNOSIS — H0102A Squamous blepharitis right eye, upper and lower eyelids: Secondary | ICD-10-CM | POA: Diagnosis not present

## 2019-11-25 DIAGNOSIS — H16141 Punctate keratitis, right eye: Secondary | ICD-10-CM | POA: Diagnosis not present

## 2020-01-30 ENCOUNTER — Telehealth: Payer: Self-pay | Admitting: Internal Medicine

## 2020-01-30 NOTE — Telephone Encounter (Signed)
Pt mentioned that he and Dr. [name] spoke about him getting the vaccine at last appt and determined that it was not beneficial for him to get it. He wants to know if anything has changed about him getting it now.

## 2020-02-05 NOTE — Telephone Encounter (Signed)
In general, I would encourage everyone to be vaccinated.  I think this gentleman's concern was that he developed guillain-barre' after influenza vaccine and was concerned about vaccines in general.   I would defer this conversation to his primary care provider as this is really outside of my expertise.  From an cardiac standpoint, I do not have reasons that he should not be vaccinated.

## 2020-02-06 NOTE — Telephone Encounter (Signed)
Returned call to Pt.  Discussed Dr. Jenel Lucks advice.  Advised from a cardiac standpoint no reason not to get vaccinated.  Pt indicates understanding and will follow up with PCP.

## 2020-03-07 ENCOUNTER — Emergency Department (HOSPITAL_COMMUNITY): Payer: BC Managed Care – PPO

## 2020-03-07 ENCOUNTER — Emergency Department (HOSPITAL_COMMUNITY)
Admission: EM | Admit: 2020-03-07 | Discharge: 2020-03-07 | Disposition: A | Discharge status: Home / Self Care | Payer: BC Managed Care – PPO | Attending: Emergency Medicine | Admitting: Emergency Medicine

## 2020-03-07 ENCOUNTER — Other Ambulatory Visit: Payer: Self-pay

## 2020-03-07 ENCOUNTER — Encounter (HOSPITAL_COMMUNITY): Payer: Self-pay | Admitting: Radiology

## 2020-03-07 DIAGNOSIS — I639 Cerebral infarction, unspecified: Secondary | ICD-10-CM | POA: Insufficient documentation

## 2020-03-07 DIAGNOSIS — I10 Essential (primary) hypertension: Secondary | ICD-10-CM | POA: Insufficient documentation

## 2020-03-07 DIAGNOSIS — I5042 Chronic combined systolic (congestive) and diastolic (congestive) heart failure: Secondary | ICD-10-CM | POA: Diagnosis not present

## 2020-03-07 DIAGNOSIS — R404 Transient alteration of awareness: Secondary | ICD-10-CM | POA: Diagnosis not present

## 2020-03-07 DIAGNOSIS — Z79899 Other long term (current) drug therapy: Secondary | ICD-10-CM | POA: Diagnosis not present

## 2020-03-07 DIAGNOSIS — F10920 Alcohol use, unspecified with intoxication, uncomplicated: Secondary | ICD-10-CM | POA: Diagnosis not present

## 2020-03-07 DIAGNOSIS — I499 Cardiac arrhythmia, unspecified: Secondary | ICD-10-CM | POA: Diagnosis not present

## 2020-03-07 DIAGNOSIS — F10129 Alcohol abuse with intoxication, unspecified: Secondary | ICD-10-CM | POA: Diagnosis not present

## 2020-03-07 DIAGNOSIS — I4510 Unspecified right bundle-branch block: Secondary | ICD-10-CM | POA: Diagnosis not present

## 2020-03-07 DIAGNOSIS — I6522 Occlusion and stenosis of left carotid artery: Secondary | ICD-10-CM | POA: Diagnosis not present

## 2020-03-07 DIAGNOSIS — R29810 Facial weakness: Secondary | ICD-10-CM | POA: Diagnosis not present

## 2020-03-07 DIAGNOSIS — R4701 Aphasia: Secondary | ICD-10-CM | POA: Diagnosis not present

## 2020-03-07 DIAGNOSIS — R4781 Slurred speech: Secondary | ICD-10-CM | POA: Diagnosis not present

## 2020-03-07 DIAGNOSIS — R29818 Other symptoms and signs involving the nervous system: Secondary | ICD-10-CM | POA: Diagnosis not present

## 2020-03-07 LAB — CBC
HCT: 38 % — ABNORMAL LOW (ref 39.0–52.0)
Hemoglobin: 12.3 g/dL — ABNORMAL LOW (ref 13.0–17.0)
MCH: 34.1 pg — ABNORMAL HIGH (ref 26.0–34.0)
MCHC: 32.4 g/dL (ref 30.0–36.0)
MCV: 105.3 fL — ABNORMAL HIGH (ref 80.0–100.0)
Platelets: 182 10*3/uL (ref 150–400)
RBC: 3.61 MIL/uL — ABNORMAL LOW (ref 4.22–5.81)
RDW: 14.1 % (ref 11.5–15.5)
WBC: 7.5 10*3/uL (ref 4.0–10.5)
nRBC: 0 % (ref 0.0–0.2)

## 2020-03-07 LAB — CBG MONITORING, ED: Glucose-Capillary: 73 mg/dL (ref 70–99)

## 2020-03-07 LAB — URINALYSIS, ROUTINE W REFLEX MICROSCOPIC
Bacteria, UA: NONE SEEN
Bilirubin Urine: NEGATIVE
Glucose, UA: NEGATIVE mg/dL
Ketones, ur: NEGATIVE mg/dL
Leukocytes,Ua: NEGATIVE
Nitrite: NEGATIVE
Protein, ur: NEGATIVE mg/dL
Specific Gravity, Urine: 1.003 — ABNORMAL LOW (ref 1.005–1.030)
pH: 6 (ref 5.0–8.0)

## 2020-03-07 LAB — COMPREHENSIVE METABOLIC PANEL
ALT: 23 U/L (ref 0–44)
AST: 25 U/L (ref 15–41)
Albumin: 3.1 g/dL — ABNORMAL LOW (ref 3.5–5.0)
Alkaline Phosphatase: 90 U/L (ref 38–126)
Anion gap: 11 (ref 5–15)
BUN: 8 mg/dL (ref 6–20)
CO2: 21 mmol/L — ABNORMAL LOW (ref 22–32)
Calcium: 8 mg/dL — ABNORMAL LOW (ref 8.9–10.3)
Chloride: 103 mmol/L (ref 98–111)
Creatinine, Ser: 0.75 mg/dL (ref 0.61–1.24)
GFR calc Af Amer: >60 mL/min (ref >60)
GFR calc non Af Amer: >60 mL/min (ref >60)
Glucose, Bld: 80 mg/dL (ref 70–99)
Potassium: 4.2 mmol/L (ref 3.5–5.1)
Sodium: 135 mmol/L (ref 135–145)
Total Bilirubin: 0.4 mg/dL (ref 0.3–1.2)
Total Protein: 5.8 g/dL — ABNORMAL LOW (ref 6.5–8.1)

## 2020-03-07 LAB — I-STAT CHEM 8, ED
BUN: 8 mg/dL (ref 6–20)
Calcium, Ion: 0.96 mmol/L — ABNORMAL LOW (ref 1.15–1.40)
Chloride: 102 mmol/L (ref 98–111)
Creatinine, Ser: 1.1 mg/dL (ref 0.61–1.24)
Glucose, Bld: 77 mg/dL (ref 70–99)
HCT: 38 % — ABNORMAL LOW (ref 39.0–52.0)
Hemoglobin: 12.9 g/dL — ABNORMAL LOW (ref 13.0–17.0)
Potassium: 4.3 mmol/L (ref 3.5–5.1)
Sodium: 136 mmol/L (ref 135–145)
TCO2: 22 mmol/L (ref 22–32)

## 2020-03-07 LAB — DIFFERENTIAL
Abs Immature Granulocytes: 0.01 10*3/uL (ref 0.00–0.07)
Basophils Absolute: 0.1 10*3/uL (ref 0.0–0.1)
Basophils Relative: 1 %
Eosinophils Absolute: 0.2 10*3/uL (ref 0.0–0.5)
Eosinophils Relative: 3 %
Immature Granulocytes: 0 %
Lymphocytes Relative: 31 %
Lymphs Abs: 2.3 10*3/uL (ref 0.7–4.0)
Monocytes Absolute: 0.7 10*3/uL (ref 0.1–1.0)
Monocytes Relative: 10 %
Neutro Abs: 4.1 10*3/uL (ref 1.7–7.7)
Neutrophils Relative %: 55 %

## 2020-03-07 LAB — RAPID URINE DRUG SCREEN, HOSP PERFORMED
Amphetamines: NOT DETECTED
Barbiturates: NOT DETECTED
Benzodiazepines: NOT DETECTED
Cocaine: NOT DETECTED
Opiates: NOT DETECTED
Tetrahydrocannabinol: NOT DETECTED

## 2020-03-07 LAB — PROTIME-INR
INR: 1.2 (ref 0.8–1.2)
Prothrombin Time: 14.4 seconds (ref 11.4–15.2)

## 2020-03-07 LAB — ETHANOL: Alcohol, Ethyl (B): 357 mg/dL (ref <10)

## 2020-03-07 LAB — APTT: aPTT: 27 seconds (ref 24–36)

## 2020-03-07 MED ORDER — SODIUM CHLORIDE 0.9% FLUSH: 3.0000 mL | Freq: Once | INTRAVENOUS | Status: DC

## 2020-03-07 MED ORDER — IOHEXOL 350 MG/ML SOLN
65.0000 mL | Freq: Once | INTRAVENOUS | Status: AC | PRN
  Administered 2020-03-07: 65 mL via INTRAVENOUS

## 2020-03-07 NOTE — Discharge Instructions (Addendum)
Contact a health care provider if: You do not feel better after a few days. You have problems at work, at school, or at home due to drinking. Get help right away if: You have any of the following: Moderate to severe trouble with coordination, speech, memory, or attention. Trouble staying awake. Severe confusion. A seizure. Light-headedness. Fainting. Vomiting bright red blood or material that looks like coffee grounds. Bloody stool (feces). The blood may make your stool bright red, black, or tarry. It may also smell bad. Shakiness when trying to stop drinking. Thoughts about hurting yourself or others.

## 2020-03-07 NOTE — Consult Note (Addendum)
Neurology Consultation Reason for Consult: Code stroke Referring Physician: Frederick [name]   CC: Sudden onset aphasia  History is obtained from: EMS and chart review   HPI: Tyrone [name] is a 54 y.o. male presenting with acute onset difficulty talking with significant stroke risk factors of atrial fibrillation (on xarelto), prior right parietal infarct (2013), hypertension, NICM (tachycardia related, recovered), possible sick sinus syndrome, OSA on CPAP, gastric bypass.  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  His friends reported that he suddenly stopped talking to them at 2 PM and therefore they activated EMS.  They reported he had had at least 5 drinks.  Per EMS report his ability to communicate was rapidly improving during transport  On history from wife, he took Xarelto last night and he was complaining of some right pinky and ring finger tingling last night that started the day before.  Patient would mostly answer yes/no questions, reported he had blurry vision, but states that he felt fine.  Denied headache.  Denied focal weakness.  Denied nausea.  Needed to urinate.    Regarding history of history it appears his atrial fibrillation was diagnosed after his right parietal infarct was discovered in 2013  LKW: 2 PM tPA given?: No, last took xarelto last night  ROS: Limited review of systems due to mental status  Past Medical History:  Diagnosis Date  . Anemia, iron deficiency 01/30/2017  . Cervicalgia   . CVA (cerebral infarction) 05/01/12   on pradaxa at time of stroke, switched to xarelto  . External thrombosed hemorrhoids   . GERD (gastroesophageal reflux disease)   . Guillain Barr syndrome [facility])    following seasonal influenza vaccination  . Guillain-Barre syndrome (HCC)    from stroke in 2013  . Hematuria    a. 01/2012 - to f/u with urology as outpt.  Marland Kitchen Hernia of unspecified site of abdominal cavity without  mention of obstruction or gangrene    of abdominal  . Hx of gastric bypass 01/30/2017   2004 Dr Clent Ridges [facility] weight 450 lbs  . Hypertension   . Iron malabsorption 01/30/2017   Hx gastric bypass 2004  . Morbid obesity s/p gastric bypass   . Nonischemic cardiomyopathy (HCC)    a. 01/2012 Echo: EF 35-40%, Diff HK, mild MR;  b. 01/2012 R&L Heart Cath: mildly elevated R heart pressures (pcwp 17) , nl cors.  . Osteoarthrosis, unspecified whether generalized or localized, ankle and foot    of the knees,ankle and foot  . Pancytopenia   . Persistent atrial fibrillation (HCC)    a. Pradaxa started 01/2012;  b .s/p TEE/DCCV 02/16/2012; c. recurrent afib->Tikosyn Initiation 03/2012  . Sleep apnea    CPAP compliant  . Status post bypass gastrojejunostomy 01/30/2017  . Stroke (HCC)   . Unspecified intestinal malabsorption   . Urticaria, unspecified   . Vitamin B 12 deficiency     Family History  Problem Relation Age of Onset  . Hypertension Mother   . Hypertension Father   . Heart attack Father 30       Died  . Diabetes Father   . Kidney disease Father        with transplant  . Stroke Paternal Grandmother     Social History:  reports that he has never smoked. He quit smokeless tobacco use about 17 years ago.  His smokeless tobacco use included chew. He reports current alcohol use of  about 2.0 standard drinks of alcohol per week. He reports that he does not use drugs.   Exam: Current vital signs: There were no vitals taken for this visit. Vital signs in last 24 hours:     Physical Exam  Constitutional: Appears well-developed and well-nourished.  Psych: Affect appropriate to situation Eyes: No scleral injection HENT: No OP obstruction, moist mucous membranes MSK: no joint deformities.  Cardiovascular: Irregularly irregular Respiratory: Effort normal, non-labored breathing GI: Soft.  No distension. There is no tenderness.  GU: Uncircumcised, no rashes or lesions Skin: Warm  dry and intact  Neuro: Mental Status: Patient is awake, alert, oriented to person, month, year, Patient is unable to give a clear and coherent history. No signs of aphasia or neglect Cranial Nerves: II: Visual Fields are full. Pupils are equal, round, and reactive to light.  [name],IV, VI: EOMI without ptosis or diploplia.  Marked direction changing in gaze evoked nystagmus V: Facial sensation is symmetric to light touch VII: Facial movement is symmetric.  VIII: hearing is intact to voice X: Uvula elevates symmetrically XI: Shoulder shrug is symmetric. XII: tongue is midline without atrophy or fasciculations.  Motor: Tone is normal. Bulk is normal. 5/5 strength was present in all four extremities.  There was no pronator drift Sensory: Sensation is symmetric to light touch in the arms and legs. Deep Tendon Reflexes: 1+ and symmetric in the biceps brachioradialis and patellae, 2+ Achilles Plantars: Toes are downgoing bilaterally.  Cerebellar: Finger-to-nose is markedly ataxic bilaterally.  There is marked truncal ataxia when he is sitting on side of the bed when he is standing.  Did not attempt gait due to the severity of his ataxia   I have reviewed labs in epic and the results pertinent to this consultation are: Creatinine 1.1, glucose 77, ethanol 357  I have reviewed the images obtained: Head CT without acute intracranial abnormalities CTA without large vessel occlusion  Impression: Mr. Tyrone [name] is a 54 year old gentleman with a significant stroke risk factor of atrial fibrillation compliant with his Xarelto.  He presented with an acute onset of change in mental status that was rapidly improving.  Most likely etiology is alcohol intoxication.  Given direction changing nystagmus and severe ataxia on examination, additionally obtained a CTA to clear his posterior circulation given his significant stroke risk factors.  Recommendations: -Management of acute alcohol intoxication per  ED -Should patient's mental status not improve with supportive care and detoxification, recommend an MRI brain  These recommendations were communicated to Arthor Captain, PA via secure chat.  Neurology will sign off at this time  Brooke Dare MD-PhD [facility] [phone]  Addended for charge capture

## 2020-03-07 NOTE — ED Notes (Signed)
Pt ambulated into the hallway to the end of the nurses station and back with no issues. Pt denied dizziness, pain and balance issues.

## 2020-03-07 NOTE — ED Provider Notes (Signed)
[facility] EMERGENCY DEPARTMENT Provider Note   CSN: [id number] Arrival date & time: 03/07/20  1454     History Chief Complaint  Patient presents with  . Code Stroke    Tyrone [name] is a 54 y.o. male who presents emergency department with chief complaint of aphasia.  He has a past medical history of previous parietal CVA on Pradaxa at the time of stroke thought to be embolic.  He is currently on Xarelto.  History of Guyon Barr syndrome, previous gastric bypass surgery, persistent atrial fibrillation, right-sided weakness status post stroke which is very mild.  Patient was at the [employer] today.  He denies drinking alcohol but his friends state that he had about 5 beers.  During this time the patient suddenly became aphasic and had difficulty getting any of his speech out.  EMS reports that when they first interviewed him the only word he would say to them is no.  As they were traveling he began having improvement in his speech.  They state that he was very ataxic but it was difficult to tell if this was due to alcohol intoxication.  He has no other complaints at this time.patient seen as code stroke.  HPI     Past Medical History:  Diagnosis Date  . Anemia, iron deficiency 01/30/2017  . Cervicalgia   . CVA (cerebral infarction) 05/01/12   on pradaxa at time of stroke, switched to xarelto  . External thrombosed hemorrhoids   . GERD (gastroesophageal reflux disease)   . Guillain Barr syndrome [facility])    following seasonal influenza vaccination  . Guillain-Barre syndrome (HCC)    from stroke in 2013  . Hematuria    a. 01/2012 - to f/u with urology as outpt.  Marland Kitchen Hernia of unspecified site of abdominal cavity without mention of obstruction or gangrene    of abdominal  . Hx of gastric bypass 01/30/2017   2004 Dr Clent Ridges [facility] weight 450 lbs  . Hypertension   . Iron malabsorption 01/30/2017   Hx gastric bypass 2004  . Morbid obesity s/p  gastric bypass   . Nonischemic cardiomyopathy (HCC)    a. 01/2012 Echo: EF 35-40%, Diff HK, mild MR;  b. 01/2012 R&L Heart Cath: mildly elevated R heart pressures (pcwp 17) , nl cors.  . Osteoarthrosis, unspecified whether generalized or localized, ankle and foot    of the knees,ankle and foot  . Pancytopenia   . Persistent atrial fibrillation (HCC)    a. Pradaxa started 01/2012;  b .s/p TEE/DCCV 02/16/2012; c. recurrent afib->Tikosyn Initiation 03/2012  . Sleep apnea    CPAP compliant  . Status post bypass gastrojejunostomy 01/30/2017  . Stroke (HCC)   . Unspecified intestinal malabsorption   . Urticaria, unspecified   . Vitamin B 12 deficiency     Patient Active Problem List   Diagnosis Date Noted  . Persistent atrial fibrillation (HCC) 10/04/2017  . Iron malabsorption 01/30/2017  . Hx of gastric bypass 01/30/2017  . Status post bypass gastrojejunostomy 01/30/2017  . Anemia, iron deficiency 01/30/2017  . Left-sided epistaxis 07/04/2016  . Anemia 06/05/2016  . OSA on CPAP 06/05/2016  . Tremor 07/05/2015  . Cerebral embolism with cerebral infarction (HCC) 05/01/2012  . Reflux esophagitis   . Chronic combined systolic and diastolic heart failure (HCC) 04/07/2012  . Hypokalemia 04/06/2012  . Sinoatrial node dysfunction/bradycardia   . Nonischemic cardiomyopathy (HCC)   . Atrial fibrillation (HCC)   . Hypertension 11/16/2011  .  Obesity 11/16/2011    Past Surgical History:  Procedure Laterality Date  . ATRIAL FIBRILLATION ABLATION N/A 06/25/2012   PVI by Dr [name]  . ATRIAL FIBRILLATION ABLATION N/A 10/04/2017   Procedure: ATRIAL FIBRILLATION ABLATION;  Surgeon: Hillis Range, MD;  Location: [facility] INVASIVE CV LAB;  Service: Cardiovascular;  Laterality: N/A;  . CARDIOVERSION  02/16/2012   Procedure: CARDIOVERSION;  Surgeon: [name], MD;  Location: [facility] ENDOSCOPY;  Service: Cardiovascular;  Laterality: N/A;  . CARDIOVERSION  04/22/2012   Procedure: CARDIOVERSION;  Surgeon: [name], MD;  Location: [facility] ENDOSCOPY;  Service: Cardiovascular;  Laterality: N/A;  . CARDIOVERSION N/A 08/22/2017   Procedure: CARDIOVERSION;  Surgeon: Chilton Si, MD;  Location: [facility] ENDOSCOPY;  Service: Cardiovascular;  Laterality: N/A;  . CARDIOVERSION N/A 06/11/2018   Procedure: CARDIOVERSION;  Surgeon: Vesta Mixer, MD;  Location: [facility] ENDOSCOPY;  Service: Cardiovascular;  Laterality: N/A;  . CARDIOVERSION N/A 04/29/2019   Procedure: CARDIOVERSION;  Surgeon: Sande Rives, MD;  Location: [facility] ENDOSCOPY;  Service: Endoscopy;  Laterality: N/A;  . CARDIOVERSION N/A 06/18/2019   Procedure: CARDIOVERSION;  Surgeon: Chilton Si, MD;  Location: [facility] ENDOSCOPY;  Service: Cardiovascular;  Laterality: N/A;  . COLONOSCOPY N/A 07/07/2016   Procedure: COLONOSCOPY;  Surgeon: Jeani Hawking, MD;  Location: [facility] ENDOSCOPY;  Service: Endoscopy;  Laterality: N/A;  . ESOPHAGOGASTRODUODENOSCOPY N/A 07/07/2016   Procedure: ESOPHAGOGASTRODUODENOSCOPY (EGD);  Surgeon: Jeani Hawking, MD;  Location: [facility] ENDOSCOPY;  Service: Endoscopy;  Laterality: N/A;  . GASTRIC BYPASS  2004  . HERNIA REPAIR    . LEFT AND RIGHT HEART CATHETERIZATION WITH CORONARY ANGIOGRAM N/A 02/13/2012   Procedure: LEFT AND RIGHT HEART CATHETERIZATION WITH CORONARY ANGIOGRAM;  Surgeon: Tonny Bollman, MD;  Location: [facility] CATH LAB;  Service: Cardiovascular;  Laterality: N/A;  . TEE WITHOUT CARDIOVERSION  02/16/2012   Procedure: TRANSESOPHAGEAL ECHOCARDIOGRAM (TEE);  Surgeon: [name], MD;  Location: [facility] ENDOSCOPY;  Service: Cardiovascular;  Laterality: N/A;  10a CV  . TEE WITHOUT CARDIOVERSION  06/24/2012   Procedure: TRANSESOPHAGEAL ECHOCARDIOGRAM (TEE);  Surgeon: [name], MD;  Location: [facility] ENDOSCOPY;  Service: Cardiovascular;  Laterality: N/A;  . TEE WITHOUT CARDIOVERSION N/A 08/22/2017   Procedure: TRANSESOPHAGEAL ECHOCARDIOGRAM (TEE);  Surgeon: Chilton Si, MD;  Location: [facility] ENDOSCOPY;  Service: Cardiovascular;   Laterality: N/A;  . TEE WITHOUT CARDIOVERSION N/A 06/11/2018   Procedure: TRANSESOPHAGEAL ECHOCARDIOGRAM (TEE);  Surgeon: Elease Hashimoto Deloris Ping, MD;  Location: [facility] ENDOSCOPY;  Service: Cardiovascular;  Laterality: N/A;  . UMBILICAL HERNIA REPAIR     x 2       Family History  Problem Relation Age of Onset  . Hypertension Mother   . Hypertension Father   . Heart attack Father 41       Died  . Diabetes Father   . Kidney disease Father        with transplant  . Stroke Paternal Grandmother     Social History   Tobacco Use  . Smoking status: Never Smoker  . Smokeless tobacco: Former Neurosurgeon    Types: Engineer, drilling  . Vaping Use: Never used  Substance Use Topics  . Alcohol use: Yes    Alcohol/week: 2.0 standard drinks    Types: 2 Standard drinks or equivalent per week    Comment: weekends  . Drug use: No    Home Medications Prior to Admission medications   Medication Sig Start Date End Date Taking? Authorizing Provider  acetaminophen (TYLENOL) 500 MG tablet Take 1,000 mg by mouth every  6 (six) hours as needed (for pain).    [provider]  amLODipine (NORVASC) 5 MG tablet TAKE ONE TABLET BY MOUTH ONCE DAILY 07/19/16   Allred, Fayrene Fearing, MD  ferrous sulfate 325 (65 FE) MG tablet Take 325 mg by mouth daily.     [provider]  flecainide (TAMBOCOR) 50 MG tablet Take 1 tablet (50 mg total) by mouth 2 (two) times daily. 05/29/19   Allred, Fayrene Fearing, MD  furosemide (LASIX) 40 MG tablet Take 20 mg by mouth daily.    [provider]  metoprolol tartrate (LOPRESSOR) 25 MG tablet Take 4 tablet (100 mg total) by mouth in AM and 3 tablet (75 mg total) by mouth in PM daily. 10/30/19   Allred, Fayrene Fearing, MD  Multiple Vitamin (MULTIVITAMIN) tablet Take 1-2 tablets by mouth See admin instructions. Take 2 tablets in the morning and 1 tablet in the evening    [provider]  omeprazole (PRILOSEC) 40 MG capsule Take 40 mg by mouth daily.  09/02/16   [provider]    spironolactone (ALDACTONE) 50 MG tablet TAKE ONE TABLET BY MOUTH ONCE DAILY 07/19/16   Allred, Fayrene Fearing, MD  vitamin E (VITAMIN E) 400 UNIT capsule Take 400 Units by mouth daily.    [provider]  XARELTO 20 MG TABS tablet TAKE ONE TABLET BY MOUTH DAILY WITH  SUPPER 07/19/16   Allred, Fayrene Fearing, MD    Allergies    Patient has no known allergies.  Review of Systems   Review of Systems Ten systems reviewed and are negative for acute change, except as noted in the HPI.   Physical Exam Updated Vital Signs BP 140/81   Pulse (!) 58   Temp 98.6 F (37 C) (Oral)   Resp 18   Ht 5\' 6"  (1.676 m)   Wt 110.3 kg   SpO2 97%   BMI 39.25 kg/m   Physical Exam Vitals and nursing note reviewed.  Constitutional:      General: He is not in acute distress.    Appearance: He is well-developed. He is not diaphoretic.  HENT:     Head: Normocephalic and atraumatic.  Eyes:     General: No scleral icterus.    Conjunctiva/sclera: Conjunctivae normal.  Cardiovascular:     Rate and Rhythm: Normal rate and regular rhythm.     Heart sounds: Normal heart sounds.  Pulmonary:     Effort: Pulmonary effort is normal. No respiratory distress.     Breath sounds: Normal breath sounds.  Abdominal:     Palpations: Abdomen is soft.     Tenderness: There is no abdominal tenderness.  Musculoskeletal:     Cervical back: Normal range of motion and neck supple.  Skin:    General: Skin is warm and dry.  Neurological:     Mental Status: He is alert.     Comments: Ataxic  Mild R sided weakness  Psychiatric:        Behavior: Behavior normal.     ED Results / Procedures / Treatments   Labs (all labs ordered are listed, but only abnormal results are displayed) Labs Reviewed  CBC - Abnormal; Notable for the following components:      Result Value   RBC 3.61 (*)    Hemoglobin 12.3 (*)    HCT 38.0 (*)    MCV 105.3 (*)    MCH 34.1 (*)    All other components within normal limits  COMPREHENSIVE  METABOLIC PANEL - Abnormal; Notable for the  following components:   CO2 21 (*)    Calcium 8.0 (*)    Total Protein 5.8 (*)    Albumin 3.1 (*)    All other components within normal limits  ETHANOL - Abnormal; Notable for the following components:   Alcohol, Ethyl (B) 357 (*)    All other components within normal limits  URINALYSIS, ROUTINE W REFLEX MICROSCOPIC - Abnormal; Notable for the following components:   Color, Urine COLORLESS (*)    Specific Gravity, Urine 1.003 (*)    Hgb urine dipstick SMALL (*)    All other components within normal limits  I-STAT CHEM 8, ED - Abnormal; Notable for the following components:   Calcium, Ion 0.96 (*)    Hemoglobin 12.9 (*)    HCT 38.0 (*)    All other components within normal limits  PROTIME-INR  APTT  DIFFERENTIAL  RAPID URINE DRUG SCREEN, HOSP PERFORMED  CBG MONITORING, ED    EKG EKG Interpretation  Date/Time:  Sunday March 07 2020 15:38:32 EDT Ventricular Rate:  62 PR Interval:    QRS Duration: 125 QT Interval:  461 QTC Calculation: 469 R Axis:   5 Text Interpretation: Sinus rhythm IVCD, consider atypical RBBB since last tracing no significant change Confirmed by Rolan Bucco [phone]) on 03/08/2020 9:20:56 AM   Radiology No results found.  Procedures Procedures (including critical care time)  Medications Ordered in ED Medications  iohexol (OMNIPAQUE) 350 MG/ML injection 65 mL (65 mLs Intravenous Contrast Given 03/07/20 1532)    ED Course  I have reviewed the triage vital signs and the nursing notes.  Pertinent labs & imaging results that were available during my care of the patient were reviewed by me and considered in my medical decision making (see chart for details).  Clinical Course as of Mar 11 1315  Sun Mar 07, 2020  1620 Patient CT, CTA negative. Case Discussed with DR. Bhagat. Patient is extremely intoxicated, No MRI at this time, unless his sxs do not improve after sobriety.   [AH]    Clinical Course  User Index [AH] Arthor Captain, PA-C   MDM Rules/Calculators/A&P                          CC: Altered mental status VS:  Vitals:   03/07/20 1900 03/07/20 1915 03/07/20 1930 03/07/20 1945  BP: 125/80 132/80 132/83 140/81  Pulse: (!) 58 (!) 57 (!) 57 (!) 58  Resp: (!) 21 14 14 18   Temp:    98.6 F (37 C)  TempSrc:    Oral  SpO2: 99% 100% 99% 97%  Weight:      Height:        is gathered by EMS and EMR. Previous records obtained and reviewed. DDX:The patient's complaint of altered mental status involves an extensive number of diagnostic and treatment options, and is a complaint that carries with it a high risk of complications, morbidity, and potential mortality. Given the large differential diagnosis, medical decision making is of high complexity. The differential diagnosis for AMS is extensive and includes, but is not limited to: drug overdose - opioids, alcohol, sedatives, antipsychotics, drug withdrawal, others; Metabolic: hypoxia, hypoglycemia, hyperglycemia, hypercalcemia, hypernatremia, hyponatremia, uremia, hepatic encephalopathy, hypothyroidism, hyperthyroidism, vitamin B12 or thiamine deficiency, carbon monoxide poisoning, Wilson's disease, Lactic acidosis, DKA/HHOS; Infectious: meningitis, encephalitis, bacteremia/sepsis, urinary tract infection, pneumonia, neurosyphilis; Structural: Space-occupying lesion, (brain tumor, subdural hematoma, hydrocephalus,); Vascular: stroke, subarachnoid hemorrhage, coronary ischemia, hypertensive encephalopathy, CNS vasculitis, thrombotic thrombocytopenic purpura,  disseminated intravascular coagulation, hyperviscosity; Psychiatric: Schizophrenia, depression; Other: Seizure, hypothermia, heat stroke, ICU psychosis, dementia -"sundowning."   Labs: I ordered reviewed and interpreted labs which include CBC with mild macrocytic anemia, CMP with mild proteinemia likely secondary to previous gastric bypass surgery, PT/INR within normal  limits normal blood sugar, ethanol elevated at 357 Imaging: I ordered and reviewed images which included CT scan of the head and CT angiogram of the head and neck. I independently visualized and interpreted all imaging. There are no acute, significant findings on today's images. EKG: Sinus rhythm at a rate of 82 with intraventricular conduction delay Consults: Dr. Iver Nestle of the neuro hospitalist service  MDM: Patient here with abnormal speech.  He was seen as a code stroke however does not appear to have stroke on CT imaging and patient is notably intoxicated on alcohol.  As he became more sober during his visit his speech improved significantly he is ambulatory without clinical evidence of alcohol intoxication at this time.  I discussed the case with Dr. Hildred Laser and we do not feel that he needs further imaging as his symptoms have improved.  This is likely all secondary to alcohol intoxication.  Patient appears otherwise appropriate for discharge at this time Patient disposition: The patient appears reasonably screened and/or stabilized for discharge and I doubt any other medical condition or other [facility] requiring further screening, evaluation, or treatment in the ED at this time prior to discharge. I have discussed lab and/or imaging findings with the patient and answered all questions/concerns to the best of my ability.I have discussed return precautions and OP follow up.    Final Clinical Impression(s) / ED Diagnoses Final diagnoses:  Alcoholic intoxication without complication [facility])    Rx / DC Orders ED Discharge Orders    None       Arthor Captain, PA-C 03/10/20 1316    Little, Ambrose Finland, MD 03/11/20 1429

## 2020-03-07 NOTE — ED Triage Notes (Signed)
Patient arrived via EMS: Code stroke; reported LWK 1400; aphasia and right sided deficits. Patient is from [place], Possible ETOH.

## 2020-04-09 ENCOUNTER — Telehealth: Payer: Self-pay

## 2020-04-09 NOTE — Telephone Encounter (Signed)
Spoke with pt regarding virtual visit on 04/12/20. Pt stated he did not have any questions at this time. Pt confirmed virtual appt.

## 2020-04-12 ENCOUNTER — Other Ambulatory Visit: Payer: Self-pay

## 2020-04-12 ENCOUNTER — Telehealth (INDEPENDENT_AMBULATORY_CARE_PROVIDER_SITE_OTHER): Payer: BC Managed Care – PPO | Admitting: Internal Medicine

## 2020-04-12 VITALS — BP 110/66 | HR 62 | Ht 66.0 in | Wt 218.0 lb

## 2020-04-12 DIAGNOSIS — I4819 Other persistent atrial fibrillation: Secondary | ICD-10-CM

## 2020-04-12 DIAGNOSIS — D6869 Other thrombophilia: Secondary | ICD-10-CM

## 2020-04-12 DIAGNOSIS — I10 Essential (primary) hypertension: Secondary | ICD-10-CM

## 2020-04-12 NOTE — Progress Notes (Signed)
Electrophysiology TeleHealth Note   Due to national recommendations of social distancing due to COVID 19, an audio/video telehealth visit is felt to be most appropriate for this patient at this time.  See MyChart message from today for the patient's consent to telehealth for [facility].  Date:  04/12/2020   ID:  Tyrone [name], DOB 03-28-1966, MRN [id number]  Location: patient's home  Provider location:  [place]  Evaluation Performed: Follow-up visit  PCP:  Shirlean Mylar, MD   Electrophysiologist:  Dr [name]  Chief Complaint:  palpitations  History of Present Illness:    Tyrone [name] is a 54 y.o. male who presents via telehealth conferencing today.  Since last being seen in our clinic, the patient reports doing very well.  Today, he denies symptoms of palpitations, chest pain, shortness of breath,  lower extremity edema, dizziness, presyncope, or syncope.  The patient is otherwise without complaint today.   Past Medical History:  Diagnosis Date  . Anemia, iron deficiency 01/30/2017  . Cervicalgia   . CVA (cerebral infarction) 05/01/12   on pradaxa at time of stroke, switched to xarelto  . External thrombosed hemorrhoids   . GERD (gastroesophageal reflux disease)   . Guillain Barr syndrome [facility])    following seasonal influenza vaccination  . Guillain-Barre syndrome (HCC)    from stroke in 2013  . Hematuria    a. 01/2012 - to f/u with urology as outpt.  Marland Kitchen Hernia of unspecified site of abdominal cavity without mention of obstruction or gangrene    of abdominal  . Hx of gastric bypass 01/30/2017   2004 Dr Clent Ridges [facility] weight 450 lbs  . Hypertension   . Iron malabsorption 01/30/2017   Hx gastric bypass 2004  . Morbid obesity s/p gastric bypass   . Nonischemic cardiomyopathy (HCC)    a. 01/2012 Echo: EF 35-40%, Diff HK, mild MR;  b. 01/2012 R&L Heart Cath: mildly elevated R heart pressures (pcwp 17) , nl cors.  . Osteoarthrosis, unspecified whether  generalized or localized, ankle and foot    of the knees,ankle and foot  . Pancytopenia   . Persistent atrial fibrillation (HCC)    a. Pradaxa started 01/2012;  b .s/p TEE/DCCV 02/16/2012; c. recurrent afib->Tikosyn Initiation 03/2012  . Sleep apnea    CPAP compliant  . Status post bypass gastrojejunostomy 01/30/2017  . Stroke (HCC)   . Unspecified intestinal malabsorption   . Urticaria, unspecified   . Vitamin B 12 deficiency     Past Surgical History:  Procedure Laterality Date  . ATRIAL FIBRILLATION ABLATION N/A 06/25/2012   PVI by Dr [name]  . ATRIAL FIBRILLATION ABLATION N/A 10/04/2017   Procedure: ATRIAL FIBRILLATION ABLATION;  Surgeon: Hillis Range, MD;  Location: [facility] INVASIVE CV LAB;  Service: Cardiovascular;  Laterality: N/A;  . CARDIOVERSION  02/16/2012   Procedure: CARDIOVERSION;  Surgeon: [name], MD;  Location: [facility] ENDOSCOPY;  Service: Cardiovascular;  Laterality: N/A;  . CARDIOVERSION  04/22/2012   Procedure: CARDIOVERSION;  Surgeon: [name], MD;  Location: [facility] ENDOSCOPY;  Service: Cardiovascular;  Laterality: N/A;  . CARDIOVERSION N/A 08/22/2017   Procedure: CARDIOVERSION;  Surgeon: Chilton Si, MD;  Location: [facility] ENDOSCOPY;  Service: Cardiovascular;  Laterality: N/A;  . CARDIOVERSION N/A 06/11/2018   Procedure: CARDIOVERSION;  Surgeon: Vesta Mixer, MD;  Location: [facility] ENDOSCOPY;  Service: Cardiovascular;  Laterality: N/A;  . CARDIOVERSION N/A 04/29/2019   Procedure: CARDIOVERSION;  Surgeon: Sande Rives, MD;  Location:  [facility] ENDOSCOPY;  Service: Endoscopy;  Laterality: N/A;  . CARDIOVERSION N/A 06/18/2019   Procedure: CARDIOVERSION;  Surgeon: Chilton Si, MD;  Location: [facility] ENDOSCOPY;  Service: Cardiovascular;  Laterality: N/A;  . COLONOSCOPY N/A 07/07/2016   Procedure: COLONOSCOPY;  Surgeon: Jeani Hawking, MD;  Location: [facility] ENDOSCOPY;  Service: Endoscopy;  Laterality: N/A;  . ESOPHAGOGASTRODUODENOSCOPY N/A 07/07/2016   Procedure:  ESOPHAGOGASTRODUODENOSCOPY (EGD);  Surgeon: Jeani Hawking, MD;  Location: [facility] ENDOSCOPY;  Service: Endoscopy;  Laterality: N/A;  . GASTRIC BYPASS  2004  . HERNIA REPAIR    . LEFT AND RIGHT HEART CATHETERIZATION WITH CORONARY ANGIOGRAM N/A 02/13/2012   Procedure: LEFT AND RIGHT HEART CATHETERIZATION WITH CORONARY ANGIOGRAM;  Surgeon: Tonny Bollman, MD;  Location: [facility] CATH LAB;  Service: Cardiovascular;  Laterality: N/A;  . TEE WITHOUT CARDIOVERSION  02/16/2012   Procedure: TRANSESOPHAGEAL ECHOCARDIOGRAM (TEE);  Surgeon: [name], MD;  Location: [facility] ENDOSCOPY;  Service: Cardiovascular;  Laterality: N/A;  10a CV  . TEE WITHOUT CARDIOVERSION  06/24/2012   Procedure: TRANSESOPHAGEAL ECHOCARDIOGRAM (TEE);  Surgeon: [name], MD;  Location: [facility] ENDOSCOPY;  Service: Cardiovascular;  Laterality: N/A;  . TEE WITHOUT CARDIOVERSION N/A 08/22/2017   Procedure: TRANSESOPHAGEAL ECHOCARDIOGRAM (TEE);  Surgeon: Chilton Si, MD;  Location: [facility] ENDOSCOPY;  Service: Cardiovascular;  Laterality: N/A;  . TEE WITHOUT CARDIOVERSION N/A 06/11/2018   Procedure: TRANSESOPHAGEAL ECHOCARDIOGRAM (TEE);  Surgeon: Elease Hashimoto Deloris Ping, MD;  Location: [facility] ENDOSCOPY;  Service: Cardiovascular;  Laterality: N/A;  . UMBILICAL HERNIA REPAIR     x 2    Current Outpatient Medications  Medication Sig Dispense Refill  . acetaminophen (TYLENOL) 500 MG tablet Take 1,000 mg by mouth every 6 (six) hours as needed (for pain).    Marland Kitchen amLODipine (NORVASC) 5 MG tablet TAKE ONE TABLET BY MOUTH ONCE DAILY 30 tablet 1  . ferrous sulfate 325 (65 FE) MG tablet Take 325 mg by mouth daily.     . flecainide (TAMBOCOR) 50 MG tablet Take 1 tablet (50 mg total) by mouth 2 (two) times daily. 180 tablet 3  . furosemide (LASIX) 40 MG tablet Take 20 mg by mouth daily.    . metoprolol tartrate (LOPRESSOR) 25 MG tablet Take 4 tablet (100 mg total) by mouth in AM and 3 tablet (75 mg total) by mouth in PM daily. 630 tablet 3  . Multiple Vitamin  (MULTIVITAMIN) tablet Take 1-2 tablets by mouth See admin instructions. Take 2 tablets in the morning and 1 tablet in the evening    . omeprazole (PRILOSEC) 40 MG capsule Take 40 mg by mouth daily.   1  . spironolactone (ALDACTONE) 50 MG tablet TAKE ONE TABLET BY MOUTH ONCE DAILY 30 tablet 1  . vitamin E (VITAMIN E) 400 UNIT capsule Take 400 Units by mouth daily.    [name] Hurl 20 MG TABS tablet TAKE ONE TABLET BY MOUTH DAILY WITH  SUPPER 30 tablet 1   No current facility-administered medications for this visit.   Facility-Administered Medications Ordered in Other Visits  Medication Dose Route Frequency Provider Last Rate Last Admin  . gadopentetate dimeglumine (MAGNEVIST) injection 20 mL  20 mL Intravenous Once PRN Anson Fret, MD        Allergies:   Patient has no known allergies.   Social History:  The patient  reports that he has never smoked. He quit smokeless tobacco use about 17 years ago.  His smokeless tobacco use included chew. He reports current alcohol use of about 2.0 standard drinks of alcohol per  week. He reports that he does not use drugs.   ROS:  Please see the history of present illness.   All other systems are personally reviewed and negative.    Exam:    Vital Signs:  BP 110/66   Pulse 62   Ht 5\' 6"  (1.676 m)   Wt 218 lb (98.9 kg)   BMI 35.19 kg/m   Well sounding and appearing, alert and conversant, regular work of breathing,  good skin color Eyes- anicteric, neuro- grossly intact, skin- no apparent rash or lesions or cyanosis, mouth- oral mucosa is pink  Labs/Other Tests and Data Reviewed:    Recent Labs: 03/07/2020: ALT 23; BUN 8; Creatinine, Ser 1.10; Hemoglobin 12.9; Platelets 182; Potassium 4.3; Sodium 136   Wt Readings from Last 3 Encounters:  04/12/20 218 lb (98.9 kg)  03/07/20 243 lb 2.7 oz (110.3 kg)  10/08/19 211 lb (95.7 kg)     ASSESSMENT & PLAN:    1.  Persistent atrial fibrillation/ atrial flutter Doing very well s/p ablation x 2. He  continues low dose flecainide We are following him closely to prevent toxicity with this medicine He is on xarelto He follows his rhythm with apple watch and it is controlled  2. HTN Stable No change required today  3. Nonischemic  Resolved with sinus rhythm Echo 4/21 reviewed with him today  Risks, benefits and potential toxicities for medications prescribed and/or refilled reviewed with patient today.   Follow-up:  6 months with me   Patient Risk:  after full review of this patients clinical status, I feel that they are at moderate risk at this time.  Today, I have spent 15 minutes with the patient with telehealth technology discussing arrhythmia management .    Signed5/21, MD  04/12/2020 2:55 PM     [facility] [address] [place] [phone] (office) [phone] (fax)

## 2020-04-26 ENCOUNTER — Other Ambulatory Visit: Payer: Self-pay | Admitting: Internal Medicine

## 2020-05-08 ENCOUNTER — Emergency Department (HOSPITAL_BASED_OUTPATIENT_CLINIC_OR_DEPARTMENT_OTHER)
Admission: EM | Admit: 2020-05-08 | Discharge: 2020-05-08 | Disposition: A | Discharge status: Home / Self Care | Payer: BC Managed Care – PPO | Attending: Emergency Medicine | Admitting: Emergency Medicine

## 2020-05-08 ENCOUNTER — Encounter (HOSPITAL_BASED_OUTPATIENT_CLINIC_OR_DEPARTMENT_OTHER): Payer: Self-pay | Admitting: Emergency Medicine

## 2020-05-08 ENCOUNTER — Emergency Department (HOSPITAL_BASED_OUTPATIENT_CLINIC_OR_DEPARTMENT_OTHER): Payer: BC Managed Care – PPO

## 2020-05-08 ENCOUNTER — Other Ambulatory Visit: Payer: Self-pay

## 2020-05-08 DIAGNOSIS — S32010D Wedge compression fracture of first lumbar vertebra, subsequent encounter for fracture with routine healing: Secondary | ICD-10-CM | POA: Diagnosis not present

## 2020-05-08 DIAGNOSIS — S32010A Wedge compression fracture of first lumbar vertebra, initial encounter for closed fracture: Secondary | ICD-10-CM

## 2020-05-08 DIAGNOSIS — I5042 Chronic combined systolic (congestive) and diastolic (congestive) heart failure: Secondary | ICD-10-CM | POA: Insufficient documentation

## 2020-05-08 DIAGNOSIS — M545 Low back pain, unspecified: Secondary | ICD-10-CM

## 2020-05-08 DIAGNOSIS — M2578 Osteophyte, vertebrae: Secondary | ICD-10-CM | POA: Diagnosis not present

## 2020-05-08 DIAGNOSIS — S3992XA Unspecified injury of lower back, initial encounter: Secondary | ICD-10-CM | POA: Diagnosis not present

## 2020-05-08 DIAGNOSIS — Y9353 Activity, golf: Secondary | ICD-10-CM | POA: Diagnosis not present

## 2020-05-08 DIAGNOSIS — M48061 Spinal stenosis, lumbar region without neurogenic claudication: Secondary | ICD-10-CM | POA: Diagnosis not present

## 2020-05-08 DIAGNOSIS — X58XXXA Exposure to other specified factors, initial encounter: Secondary | ICD-10-CM | POA: Diagnosis not present

## 2020-05-08 DIAGNOSIS — Z79899 Other long term (current) drug therapy: Secondary | ICD-10-CM | POA: Insufficient documentation

## 2020-05-08 DIAGNOSIS — M5459 Other low back pain: Secondary | ICD-10-CM | POA: Diagnosis not present

## 2020-05-08 DIAGNOSIS — I110 Hypertensive heart disease with heart failure: Secondary | ICD-10-CM | POA: Insufficient documentation

## 2020-05-08 DIAGNOSIS — R109 Unspecified abdominal pain: Secondary | ICD-10-CM | POA: Diagnosis not present

## 2020-05-08 DIAGNOSIS — I700 Atherosclerosis of aorta: Secondary | ICD-10-CM | POA: Diagnosis not present

## 2020-05-08 DIAGNOSIS — Z7901 Long term (current) use of anticoagulants: Secondary | ICD-10-CM | POA: Insufficient documentation

## 2020-05-08 LAB — URINALYSIS, ROUTINE W REFLEX MICROSCOPIC
Bilirubin Urine: NEGATIVE
Glucose, UA: NEGATIVE mg/dL
Hgb urine dipstick: NEGATIVE
Ketones, ur: NEGATIVE mg/dL
Nitrite: NEGATIVE
Protein, ur: NEGATIVE mg/dL
Specific Gravity, Urine: 1.02 (ref 1.005–1.030)
pH: 6 (ref 5.0–8.0)

## 2020-05-08 LAB — COMPREHENSIVE METABOLIC PANEL
ALT: 25 U/L (ref 0–44)
AST: 30 U/L (ref 15–41)
Albumin: 3.5 g/dL (ref 3.5–5.0)
Alkaline Phosphatase: 67 U/L (ref 38–126)
Anion gap: 8 (ref 5–15)
BUN: 13 mg/dL (ref 6–20)
CO2: 24 mmol/L (ref 22–32)
Calcium: 8.2 mg/dL — ABNORMAL LOW (ref 8.9–10.3)
Chloride: 105 mmol/L (ref 98–111)
Creatinine, Ser: 0.78 mg/dL (ref 0.61–1.24)
GFR, Estimated: >60 mL/min (ref >60)
Glucose, Bld: 118 mg/dL — ABNORMAL HIGH (ref 70–99)
Potassium: 4.7 mmol/L (ref 3.5–5.1)
Sodium: 137 mmol/L (ref 135–145)
Total Bilirubin: 0.7 mg/dL (ref 0.3–1.2)
Total Protein: 6.2 g/dL — ABNORMAL LOW (ref 6.5–8.1)

## 2020-05-08 LAB — CBC WITH DIFFERENTIAL/PLATELET
Abs Immature Granulocytes: 0.01 10*3/uL (ref 0.00–0.07)
Basophils Absolute: 0.1 10*3/uL (ref 0.0–0.1)
Basophils Relative: 2 %
Eosinophils Absolute: 0.1 10*3/uL (ref 0.0–0.5)
Eosinophils Relative: 2 %
HCT: 38.2 % — ABNORMAL LOW (ref 39.0–52.0)
Hemoglobin: 13 g/dL (ref 13.0–17.0)
Immature Granulocytes: 0 %
Lymphocytes Relative: 21 %
Lymphs Abs: 1 10*3/uL (ref 0.7–4.0)
MCH: 34.9 pg — ABNORMAL HIGH (ref 26.0–34.0)
MCHC: 34 g/dL (ref 30.0–36.0)
MCV: 102.4 fL — ABNORMAL HIGH (ref 80.0–100.0)
Monocytes Absolute: 0.5 10*3/uL (ref 0.1–1.0)
Monocytes Relative: 10 %
Neutro Abs: 3 10*3/uL (ref 1.7–7.7)
Neutrophils Relative %: 65 %
Platelets: 151 10*3/uL (ref 150–400)
RBC: 3.73 MIL/uL — ABNORMAL LOW (ref 4.22–5.81)
RDW: 12.8 % (ref 11.5–15.5)
WBC: 4.6 10*3/uL (ref 4.0–10.5)
nRBC: 0 % (ref 0.0–0.2)

## 2020-05-08 LAB — URINALYSIS, MICROSCOPIC (REFLEX)

## 2020-05-08 LAB — LIPASE, BLOOD: Lipase: 28 U/L (ref 11–51)

## 2020-05-08 MED ORDER — METHOCARBAMOL 500 MG PO TABS: 500.0000 mg | ORAL_TABLET | Freq: Two times a day (BID) | ORAL | 0 refills | Status: DC

## 2020-05-08 MED ORDER — LIDOCAINE 5 % EX PTCH: 1.0000 | MEDICATED_PATCH | CUTANEOUS | 0 refills | Status: DC

## 2020-05-08 NOTE — ED Notes (Signed)
Pt given urine spec cup and shown where wash room is located.

## 2020-05-08 NOTE — Discharge Instructions (Addendum)
At this time there does not appear to be the presence of an emergent medical condition, however there is always the potential for conditions to change. Please read and follow the below instructions.  Please return to the Emergency Department immediately for any new or worsening symptoms. Please be sure to follow up with your Primary Care Provider within one week regarding your visit today; please call their office to schedule an appointment even if you are feeling better for a follow-up visit. You may use the muscle relaxer Robaxin as prescribed to help with your symptoms.  Do not drive or operate heavy machinery while taking Robaxin as it will make you drowsy.  Do not drink alcohol or take other sedating medications while taking Robaxin as this will worsen side effects. You may use the Lidoderm patch as prescribed to help with your symptoms.  Lidoderm may be expensive so you may speak with your pharmacist about finding over-the-counter medications that work similarly such as Teacher, adult education. Your CT scan today showed posterior disc osteophyte complexes at L3-L4 and L4-L5 resulting in minimal spinal canal narrowing and mild neural foraminal narrowing on the right at L4-L5.  Additionally it showed atherosclerosis.  Please discuss these findings with your primary care provider at your follow-up visit.  Go to the nearest Emergency Department immediately if: You have fever or chills You develop new bowel or bladder control problems. You have unusual weakness or numbness in your arms or legs. You develop nausea or vomiting. You develop abdominal pain. You feel faint. You have any new/concerning or worsening of symptoms.  Please read the additional information packets attached to your discharge summary.  Do not take your medicine if  develop an itchy rash, swelling in your mouth or lips, or difficulty breathing; call 911 and seek immediate emergency medical attention if this occurs.  You may review your lab  tests and imaging results in their entirety on your MyChart account.  Please discuss all results of fully with your primary care provider and other specialist at your follow-up visit.  Note: Portions of this text may have been transcribed using voice recognition software. Every effort was made to ensure accuracy; however, inadvertent computerized transcription errors may still be present.

## 2020-05-08 NOTE — ED Notes (Signed)
Onset of back pain was this past wed evening, Thursday am had some improvement, but by Thursday PM pain became worse, went to [facility] and was concerned in L1 region. But was concerned it was not his back but possible kidney issues. Pt points to rt flank area. Is able to void without difficulty.

## 2020-05-08 NOTE — ED Triage Notes (Signed)
R low back pain x 3 days. He was seen by ortho and found to have a compression fx at L1. Sent here for further workup. Denies injury.

## 2020-05-08 NOTE — ED Provider Notes (Signed)
[facility] EMERGENCY DEPARTMENT Provider Note   CSN: [id number] Arrival date & time: 05/08/20  1328     History Chief Complaint  Patient presents with  . Back Pain    Tyrone [name] is a 54 y.o. male history of gastric bypass, obesity, Guillain-Barr's, CVA, hypertension, cardiomyopathy, A. fib on Xarelto.  Patient presents today for right back pain onset Thursday.  Patient was out playing golf that morning, after he was done playing he got back home and had some right flank pain.  He reports pain as moderate constant throbbing worsened with palpation and movement improved with rest, nonradiating.  He went to an orthopedic urgent care and they performed a lumbar spine film which showed a compression fracture of L1 unknown age.  They were concerned for potential kidney stone and sent patient to the ER for evaluation.  Patient denies fall/injury, fever/chills, headache, chest pain/shortness of breath, abdominal pain, nausea/vomiting, diarrhea, dysuria/hematuria, testicular pain/swelling or additional concerns.  Patient does report distant history of kidney stone. HPI     Past Medical History:  Diagnosis Date  . Anemia, iron deficiency 01/30/2017  . Cervicalgia   . CVA (cerebral infarction) 05/01/12   on pradaxa at time of stroke, switched to xarelto  . External thrombosed hemorrhoids   . GERD (gastroesophageal reflux disease)   . Guillain Barr syndrome [facility])    following seasonal influenza vaccination  . Guillain-Barre syndrome (HCC)    from stroke in 2013  . Hematuria    a. 01/2012 - to f/u with urology as outpt.  Marland Kitchen Hernia of unspecified site of abdominal cavity without mention of obstruction or gangrene    of abdominal  . Hx of gastric bypass 01/30/2017   2004 Dr Clent Ridges [facility] weight 450 lbs  . Hypertension   . Iron malabsorption 01/30/2017   Hx gastric bypass 2004  . Morbid obesity s/p gastric bypass   . Nonischemic cardiomyopathy (HCC)    a.  01/2012 Echo: EF 35-40%, Diff HK, mild MR;  b. 01/2012 R&L Heart Cath: mildly elevated R heart pressures (pcwp 17) , nl cors.  . Osteoarthrosis, unspecified whether generalized or localized, ankle and foot    of the knees,ankle and foot  . Pancytopenia   . Persistent atrial fibrillation (HCC)    a. Pradaxa started 01/2012;  b .s/p TEE/DCCV 02/16/2012; c. recurrent afib->Tikosyn Initiation 03/2012  . Sleep apnea    CPAP compliant  . Status post bypass gastrojejunostomy 01/30/2017  . Stroke (HCC)   . Unspecified intestinal malabsorption   . Urticaria, unspecified   . Vitamin B 12 deficiency     Patient Active Problem List   Diagnosis Date Noted  . Persistent atrial fibrillation (HCC) 10/04/2017  . Iron malabsorption 01/30/2017  . Hx of gastric bypass 01/30/2017  . Status post bypass gastrojejunostomy 01/30/2017  . Anemia, iron deficiency 01/30/2017  . Left-sided epistaxis 07/04/2016  . Anemia 06/05/2016  . OSA on CPAP 06/05/2016  . Tremor 07/05/2015  . Cerebral embolism with cerebral infarction (HCC) 05/01/2012  . Reflux esophagitis   . Chronic combined systolic and diastolic heart failure (HCC) 04/07/2012  . Hypokalemia 04/06/2012  . Sinoatrial node dysfunction/bradycardia   . Nonischemic cardiomyopathy (HCC)   . Atrial fibrillation (HCC)   . Hypertension 11/16/2011  . Obesity 11/16/2011    Past Surgical History:  Procedure Laterality Date  . ATRIAL FIBRILLATION ABLATION N/A 06/25/2012   PVI by Dr [name]  . ATRIAL FIBRILLATION ABLATION N/A 10/04/2017   Procedure: ATRIAL  FIBRILLATION ABLATION;  Surgeon: Hillis Range, MD;  Location: [facility] INVASIVE CV LAB;  Service: Cardiovascular;  Laterality: N/A;  . CARDIOVERSION  02/16/2012   Procedure: CARDIOVERSION;  Surgeon: [name], MD;  Location: [facility] ENDOSCOPY;  Service: Cardiovascular;  Laterality: N/A;  . CARDIOVERSION  04/22/2012   Procedure: CARDIOVERSION;  Surgeon: [name], MD;  Location: [facility] ENDOSCOPY;  Service:  Cardiovascular;  Laterality: N/A;  . CARDIOVERSION N/A 08/22/2017   Procedure: CARDIOVERSION;  Surgeon: Chilton Si, MD;  Location: [facility] ENDOSCOPY;  Service: Cardiovascular;  Laterality: N/A;  . CARDIOVERSION N/A 06/11/2018   Procedure: CARDIOVERSION;  Surgeon: Vesta Mixer, MD;  Location: [facility] ENDOSCOPY;  Service: Cardiovascular;  Laterality: N/A;  . CARDIOVERSION N/A 04/29/2019   Procedure: CARDIOVERSION;  Surgeon: Sande Rives, MD;  Location: [facility] ENDOSCOPY;  Service: Endoscopy;  Laterality: N/A;  . CARDIOVERSION N/A 06/18/2019   Procedure: CARDIOVERSION;  Surgeon: Chilton Si, MD;  Location: [facility] ENDOSCOPY;  Service: Cardiovascular;  Laterality: N/A;  . COLONOSCOPY N/A 07/07/2016   Procedure: COLONOSCOPY;  Surgeon: Jeani Hawking, MD;  Location: [facility] ENDOSCOPY;  Service: Endoscopy;  Laterality: N/A;  . ESOPHAGOGASTRODUODENOSCOPY N/A 07/07/2016   Procedure: ESOPHAGOGASTRODUODENOSCOPY (EGD);  Surgeon: Jeani Hawking, MD;  Location: [facility] ENDOSCOPY;  Service: Endoscopy;  Laterality: N/A;  . GASTRIC BYPASS  2004  . HERNIA REPAIR    . LEFT AND RIGHT HEART CATHETERIZATION WITH CORONARY ANGIOGRAM N/A 02/13/2012   Procedure: LEFT AND RIGHT HEART CATHETERIZATION WITH CORONARY ANGIOGRAM;  Surgeon: Tonny Bollman, MD;  Location: [facility] CATH LAB;  Service: Cardiovascular;  Laterality: N/A;  . TEE WITHOUT CARDIOVERSION  02/16/2012   Procedure: TRANSESOPHAGEAL ECHOCARDIOGRAM (TEE);  Surgeon: [name], MD;  Location: [facility] ENDOSCOPY;  Service: Cardiovascular;  Laterality: N/A;  10a CV  . TEE WITHOUT CARDIOVERSION  06/24/2012   Procedure: TRANSESOPHAGEAL ECHOCARDIOGRAM (TEE);  Surgeon: [name], MD;  Location: [facility] ENDOSCOPY;  Service: Cardiovascular;  Laterality: N/A;  . TEE WITHOUT CARDIOVERSION N/A 08/22/2017   Procedure: TRANSESOPHAGEAL ECHOCARDIOGRAM (TEE);  Surgeon: Chilton Si, MD;  Location: [facility] ENDOSCOPY;  Service: Cardiovascular;  Laterality: N/A;  . TEE WITHOUT CARDIOVERSION N/A  06/11/2018   Procedure: TRANSESOPHAGEAL ECHOCARDIOGRAM (TEE);  Surgeon: Elease Hashimoto Deloris Ping, MD;  Location: [facility] ENDOSCOPY;  Service: Cardiovascular;  Laterality: N/A;  . UMBILICAL HERNIA REPAIR     x 2       Family History  Problem Relation Age of Onset  . Hypertension Mother   . Hypertension Father   . Heart attack Father 76       Died  . Diabetes Father   . Kidney disease Father        with transplant  . Stroke Paternal Grandmother     Social History   Tobacco Use  . Smoking status: Never Smoker  . Smokeless tobacco: Former Neurosurgeon    Types: Engineer, drilling  . Vaping Use: Never used  Substance Use Topics  . Alcohol use: Yes    Alcohol/week: 2.0 standard drinks    Types: 2 Standard drinks or equivalent per week    Comment: weekends  . Drug use: No    Home Medications Prior to Admission medications   Medication Sig Start Date End Date Taking? Authorizing Provider  acetaminophen (TYLENOL) 500 MG tablet Take 1,000 mg by mouth every 6 (six) hours as needed (for pain).    [provider]  amLODipine (NORVASC) 5 MG tablet TAKE ONE TABLET BY MOUTH ONCE DAILY 07/19/16   Hillis Range, MD  ferrous sulfate 325 (  65 FE) MG tablet Take 325 mg by mouth daily.     [provider]  flecainide (TAMBOCOR) 50 MG tablet Take 1 tablet (50 mg total) by mouth 2 (two) times daily. 05/29/19   Allred, Fayrene Fearing, MD  furosemide (LASIX) 40 MG tablet Take 20 mg by mouth daily.    [provider]  lidocaine (LIDODERM) 5 % Place 1 patch onto the skin daily. Remove & Discard patch within 12 hours or as directed by MD 05/08/20   Bill Salinas, PA-C  methocarbamol (ROBAXIN) 500 MG tablet Take 1 tablet (500 mg total) by mouth 2 (two) times daily. 05/08/20   Harlene Salts A, PA-C  metoprolol tartrate (LOPRESSOR) 25 MG tablet TAKE 4 TABLETS BY MOUTH ONCE DAILY IN THE MORNING AND 3 TABLETS ONCE DAILY IN THE EVENING 04/28/20   Allred, Fayrene Fearing, MD  Multiple Vitamin (MULTIVITAMIN) tablet  Take 1-2 tablets by mouth See admin instructions. Take 2 tablets in the morning and 1 tablet in the evening    [provider]  omeprazole (PRILOSEC) 40 MG capsule Take 40 mg by mouth daily.  09/02/16   [provider]  spironolactone (ALDACTONE) 50 MG tablet TAKE ONE TABLET BY MOUTH ONCE DAILY 07/19/16   Allred, Fayrene Fearing, MD  vitamin E (VITAMIN E) 400 UNIT capsule Take 400 Units by mouth daily.    [provider]  XARELTO 20 MG TABS tablet TAKE ONE TABLET BY MOUTH DAILY WITH  SUPPER 07/19/16   Allred, Fayrene Fearing, MD    Allergies    Patient has no known allergies.  Review of Systems   Review of Systems Ten systems are reviewed and are negative for acute change except as noted in the HPI  Physical Exam Updated Vital Signs BP 134/87   Pulse (!) 59   Temp 98.5 F (36.9 C) (Oral)   Resp 18   Ht 5\' 6"  (1.676 m)   Wt 96.6 kg   SpO2 97%   BMI 34.38 kg/m   Physical Exam Constitutional:      General: He is not in acute distress.    Appearance: Normal appearance. He is well-developed. He is not ill-appearing or diaphoretic.  HENT:     Head: Normocephalic and atraumatic.  Eyes:     General: Vision grossly intact. Gaze aligned appropriately.     Pupils: Pupils are equal, round, and reactive to light.  Neck:     Trachea: Trachea and phonation normal.  Cardiovascular:     Pulses:          Dorsalis pedis pulses are 2+ on the right side and 2+ on the left side.  Pulmonary:     Effort: Pulmonary effort is normal. No respiratory distress.  Abdominal:     General: There is no distension.     Palpations: Abdomen is soft.     Tenderness: There is no abdominal tenderness. There is no guarding or rebound.  Genitourinary:    Comments: GU examination deferred by patient. Musculoskeletal:        General: Normal range of motion.     Cervical back: Normal range of motion.     Comments: Tender to palpation of the right paraspinal lumbar musculature without overlying skin  change.  No midline C/T/L spinal tenderness to palpation, no deformity, crepitus, or step-off noted. No sign of injury to the neck or back.  Feet:     Right foot:     Protective Sensation: 3 sites tested. 3 sites sensed.     Left  foot:     Protective Sensation: 3 sites tested. 3 sites sensed.  Skin:    General: Skin is warm and dry.  Neurological:     Mental Status: He is alert.     GCS: GCS eye subscore is 4. GCS verbal subscore is 5. GCS motor subscore is 6.     Comments: Speech is clear and goal oriented, follows commands Major Cranial nerves without deficit, no facial droop Moves extremities without ataxia, coordination intact No clonus of the feet.  Psychiatric:        Behavior: Behavior normal.     ED Results / Procedures / Treatments   Labs (all labs ordered are listed, but only abnormal results are displayed) Labs Reviewed  URINALYSIS, ROUTINE W REFLEX MICROSCOPIC - Abnormal; Notable for the following components:      Result Value   Leukocytes,Ua SMALL (*)    All other components within normal limits  CBC WITH DIFFERENTIAL/PLATELET - Abnormal; Notable for the following components:   RBC 3.73 (*)    HCT 38.2 (*)    MCV 102.4 (*)    MCH 34.9 (*)    All other components within normal limits  COMPREHENSIVE METABOLIC PANEL - Abnormal; Notable for the following components:   Glucose, Bld 118 (*)    Calcium 8.2 (*)    Total Protein 6.2 (*)    All other components within normal limits  URINALYSIS, MICROSCOPIC (REFLEX) - Abnormal; Notable for the following components:   Bacteria, UA FEW (*)    All other components within normal limits  LIPASE, BLOOD    EKG None  Radiology CT L-SPINE NO CHARGE  Result Date: 05/08/2020 CLINICAL DATA:  Flank pain. Right-sided flank pain and anterior thigh pain x3 days. EXAM: CT ABDOMEN AND PELVIS WITHOUT CONTRAST CT LUMBAR SPINE WITHOUT CONTRAST TECHNIQUE: Multidetector CT imaging of the abdomen and pelvis was performed following the  standard protocol without IV contrast. Multiplanar CT images of the lumbar spine were reconstructed from contemporary CT of the Abdomen, and Pelvis COMPARISON:  CT dated February 12, 2012.  CT dated March 23, 2016. FINDINGS: Lower chest: The lung bases are clear. The heart size is normal. Hepatobiliary: The liver is normal. Normal gallbladder.There is no biliary ductal dilation. Pancreas: Normal contours without ductal dilatation. No peripancreatic fluid collection. Spleen: Unremarkable. Adrenals/Urinary Tract: --Adrenal glands: Unremarkable. --Right kidney/ureter: No hydronephrosis or radiopaque kidney stones. --Left kidney/ureter: No hydronephrosis or radiopaque kidney stones. --Urinary bladder: Unremarkable. Stomach/Bowel: --Stomach/Duodenum: The patient is status post prior gastric bypass. --Small bowel: Unremarkable. --Colon: Unremarkable. --Appendix: Normal. Vascular/Lymphatic: Atherosclerotic calcification is present within the non-aneurysmal abdominal aorta, without hemodynamically significant stenosis. --No retroperitoneal lymphadenopathy. --No mesenteric lymphadenopathy. --No pelvic or inguinal lymphadenopathy. Reproductive: Unremarkable Other: No ascites or free air. The abdominal wall is normal. Musculoskeletal. No acute displaced fractures. There is mild facet arthrosis in the lower lumbar segments. There is no significant malalignment. There is a posterior disc osteophyte complex at the L4-L5 level resulting in mild neural foraminal narrowing on the right. There is a posterior disc osteophyte complex at the L3-L4 level in association with ligamentum flavum hypertrophy. There is minimal spinal canal narrowing. IMPRESSION: 1. No CT evidence for acute intra-abdominal or intrapelvic pathology. 2. Status post prior gastric bypass. 3. No acute lumbar spine fracture or subluxation. 4. Posterior disc osteophyte complexes at the L3-L4 and L4-L5 levels resulting in minimal spinal canal narrowing and mild neural  foraminal narrowing on the right at the L4-L5 level. Aortic Atherosclerosis (ICD10-I70.0). Electronically Signed  By: Katherine Mantle M.D.   On: 05/08/2020 15:08   CT Renal Stone Study  Result Date: 05/08/2020 CLINICAL DATA:  Flank pain. Right-sided flank pain and anterior thigh pain x3 days. EXAM: CT ABDOMEN AND PELVIS WITHOUT CONTRAST CT LUMBAR SPINE WITHOUT CONTRAST TECHNIQUE: Multidetector CT imaging of the abdomen and pelvis was performed following the standard protocol without IV contrast. Multiplanar CT images of the lumbar spine were reconstructed from contemporary CT of the Abdomen, and Pelvis COMPARISON:  CT dated February 12, 2012.  CT dated March 23, 2016. FINDINGS: Lower chest: The lung bases are clear. The heart size is normal. Hepatobiliary: The liver is normal. Normal gallbladder.There is no biliary ductal dilation. Pancreas: Normal contours without ductal dilatation. No peripancreatic fluid collection. Spleen: Unremarkable. Adrenals/Urinary Tract: --Adrenal glands: Unremarkable. --Right kidney/ureter: No hydronephrosis or radiopaque kidney stones. --Left kidney/ureter: No hydronephrosis or radiopaque kidney stones. --Urinary bladder: Unremarkable. Stomach/Bowel: --Stomach/Duodenum: The patient is status post prior gastric bypass. --Small bowel: Unremarkable. --Colon: Unremarkable. --Appendix: Normal. Vascular/Lymphatic: Atherosclerotic calcification is present within the non-aneurysmal abdominal aorta, without hemodynamically significant stenosis. --No retroperitoneal lymphadenopathy. --No mesenteric lymphadenopathy. --No pelvic or inguinal lymphadenopathy. Reproductive: Unremarkable Other: No ascites or free air. The abdominal wall is normal. Musculoskeletal. No acute displaced fractures. There is mild facet arthrosis in the lower lumbar segments. There is no significant malalignment. There is a posterior disc osteophyte complex at the L4-L5 level resulting in mild neural foraminal narrowing  on the right. There is a posterior disc osteophyte complex at the L3-L4 level in association with ligamentum flavum hypertrophy. There is minimal spinal canal narrowing. IMPRESSION: 1. No CT evidence for acute intra-abdominal or intrapelvic pathology. 2. Status post prior gastric bypass. 3. No acute lumbar spine fracture or subluxation. 4. Posterior disc osteophyte complexes at the L3-L4 and L4-L5 levels resulting in minimal spinal canal narrowing and mild neural foraminal narrowing on the right at the L4-L5 level. Aortic Atherosclerosis (ICD10-I70.0). Electronically Signed   By: Katherine Mantle M.D.   On: 05/08/2020 15:08    Procedures Procedures (including critical care time)  Medications Ordered in ED Medications - No data to display  ED Course  I have reviewed the triage vital signs and the nursing notes.  Pertinent labs & imaging results that were available during my care of the patient were reviewed by me and considered in my medical decision making (see chart for details).    MDM Rules/Calculators/A&P                         Additional history obtained from: 1. Nursing notes from this visit. 2. Wife at bedside. ------------------------- I ordered, reviewed and interpreted labs which include: CBC without leukocytosis to suggest infection, no anemia. CMP without emergent electrolyte derangement, AKI, LFT elevations or gap. Lipase within normal limits. Urinalysis appears contaminated with 6-10 squamous cells but does show some leukocytes, 11-20 WBCs and few bacteria.  CT Renal Stone Study and Lumbar Spine:  IMPRESSION:  1. No CT evidence for acute intra-abdominal or intrapelvic  pathology.  2. Status post prior gastric bypass.  3. No acute lumbar spine fracture or subluxation.  4. Posterior disc osteophyte complexes at the L3-L4 and L4-L5 levels  resulting in minimal spinal canal narrowing and mild neural  foraminal narrowing on the right at the L4-L5 level.  - No evidence  of UTI or kidney stone.  Suspect musculoskeletal etiology patient's pain today.  We will treat with Lidoderm patches and Robaxin.  No evidence  or symptoms to suggest cauda equina or other emergent pathologies at this time.  Encouraged orthopedic specialist and PCP follow-up.  Patient made aware of findings of CT scan above and will follow up with PCP and orthopedist.  Additionally he was made aware of the contaminated urine sample, he has no urinary symptoms doubt UTI, will follow up with PCP.  At this time there does not appear to be any evidence of an acute emergency medical condition and the patient appears stable for discharge with appropriate outpatient follow up. Diagnosis was discussed with patient who verbalizes understanding of care plan and is agreeable to discharge. I have discussed return precautions with patient who verbalizes understanding. Patient encouraged to follow-up with their PCP and ortho. All questions answered.  Patient's case discussed with Dr. Adela Lank who agrees with plan to discharge with follow-up.   Note: Portions of this report may have been transcribed using voice recognition software. Every effort was made to ensure accuracy; however, inadvertent computerized transcription errors may still be present. Final Clinical Impression(s) / ED Diagnoses Final diagnoses:  Compression fracture of L1 lumbar vertebra (HCC)  Acute right-sided low back pain without sciatica    Rx / DC Orders ED Discharge Orders         Ordered    methocarbamol (ROBAXIN) 500 MG tablet  2 times daily        05/08/20 1634    lidocaine (LIDODERM) 5 %  Every 24 hours        05/08/20 1634           Bill Salinas, PA-C 05/08/20 1636    Melene Plan, DO 05/08/20 1832

## 2020-05-10 DIAGNOSIS — M545 Low back pain, unspecified: Secondary | ICD-10-CM | POA: Diagnosis not present

## 2020-05-10 DIAGNOSIS — M51369 Other intervertebral disc degeneration, lumbar region without mention of lumbar back pain or lower extremity pain: Secondary | ICD-10-CM | POA: Insufficient documentation

## 2020-05-10 DIAGNOSIS — M5459 Other low back pain: Secondary | ICD-10-CM | POA: Diagnosis not present

## 2020-05-10 DIAGNOSIS — M5136 Other intervertebral disc degeneration, lumbar region: Secondary | ICD-10-CM | POA: Diagnosis not present

## 2020-05-10 DIAGNOSIS — M8580 Other specified disorders of bone density and structure, unspecified site: Secondary | ICD-10-CM | POA: Diagnosis not present

## 2020-05-11 DIAGNOSIS — M5416 Radiculopathy, lumbar region: Secondary | ICD-10-CM | POA: Diagnosis not present

## 2020-05-17 ENCOUNTER — Other Ambulatory Visit: Payer: Self-pay | Admitting: Internal Medicine

## 2020-05-21 DIAGNOSIS — M8580 Other specified disorders of bone density and structure, unspecified site: Secondary | ICD-10-CM | POA: Diagnosis not present

## 2020-05-21 DIAGNOSIS — M545 Low back pain, unspecified: Secondary | ICD-10-CM | POA: Diagnosis not present

## 2020-05-21 DIAGNOSIS — M5136 Other intervertebral disc degeneration, lumbar region: Secondary | ICD-10-CM | POA: Diagnosis not present

## 2020-05-21 DIAGNOSIS — M5459 Other low back pain: Secondary | ICD-10-CM | POA: Diagnosis not present

## 2020-05-26 DIAGNOSIS — M545 Low back pain, unspecified: Secondary | ICD-10-CM | POA: Diagnosis not present

## 2020-06-02 DIAGNOSIS — M545 Low back pain, unspecified: Secondary | ICD-10-CM | POA: Diagnosis not present

## 2020-06-09 DIAGNOSIS — G4733 Obstructive sleep apnea (adult) (pediatric): Secondary | ICD-10-CM | POA: Diagnosis not present

## 2020-06-10 ENCOUNTER — Ambulatory Visit: Payer: BC Managed Care – PPO | Admitting: Adult Health

## 2020-10-07 DIAGNOSIS — H0102B Squamous blepharitis left eye, upper and lower eyelids: Secondary | ICD-10-CM | POA: Diagnosis not present

## 2020-10-07 DIAGNOSIS — H2513 Age-related nuclear cataract, bilateral: Secondary | ICD-10-CM | POA: Diagnosis not present

## 2020-10-07 DIAGNOSIS — H1045 Other chronic allergic conjunctivitis: Secondary | ICD-10-CM | POA: Diagnosis not present

## 2020-10-07 DIAGNOSIS — H0102A Squamous blepharitis right eye, upper and lower eyelids: Secondary | ICD-10-CM | POA: Diagnosis not present

## 2020-12-31 DIAGNOSIS — Z85828 Personal history of other malignant neoplasm of skin: Secondary | ICD-10-CM | POA: Diagnosis not present

## 2020-12-31 DIAGNOSIS — D1801 Hemangioma of skin and subcutaneous tissue: Secondary | ICD-10-CM | POA: Diagnosis not present

## 2020-12-31 DIAGNOSIS — B078 Other viral warts: Secondary | ICD-10-CM | POA: Diagnosis not present

## 2020-12-31 DIAGNOSIS — L570 Actinic keratosis: Secondary | ICD-10-CM | POA: Diagnosis not present

## 2021-01-05 ENCOUNTER — Telehealth: Payer: Self-pay | Admitting: Internal Medicine

## 2021-01-05 MED ORDER — RIVAROXABAN 20 MG PO TABS: ORAL_TABLET | ORAL | 1 refills | Status: DC

## 2021-01-05 NOTE — Telephone Encounter (Signed)
Scheduled over due follow up with Dr. [name] for 02/28/21. Refills had limited the dispense amount because patient was over due for follow up. Supply of medication sent in to last until follow up. This is why medication was discussed at PCP visit.

## 2021-01-05 NOTE — Telephone Encounter (Signed)
pt is wanting to leave a msg w/ Dr. Amedeo Plenty nurse about Xarelto primary care is wanting to discuss options about changing it.. pt is not wanting to change from this rx, wants Dr. Amedeo Plenty advice on what to do.

## 2021-02-28 ENCOUNTER — Other Ambulatory Visit: Payer: Self-pay

## 2021-02-28 ENCOUNTER — Encounter: Payer: Self-pay | Admitting: Internal Medicine

## 2021-02-28 ENCOUNTER — Ambulatory Visit: Payer: BC Managed Care – PPO | Admitting: Internal Medicine

## 2021-02-28 VITALS — BP 120/74 | HR 49 | Ht 66.0 in | Wt 195.6 lb

## 2021-02-28 DIAGNOSIS — I4819 Other persistent atrial fibrillation: Secondary | ICD-10-CM | POA: Diagnosis not present

## 2021-02-28 DIAGNOSIS — I484 Atypical atrial flutter: Secondary | ICD-10-CM

## 2021-02-28 DIAGNOSIS — I428 Other cardiomyopathies: Secondary | ICD-10-CM | POA: Diagnosis not present

## 2021-02-28 DIAGNOSIS — I10 Essential (primary) hypertension: Secondary | ICD-10-CM

## 2021-02-28 MED ORDER — FUROSEMIDE 40 MG PO TABS: 20.0000 mg | ORAL_TABLET | Freq: Every day | ORAL | 3 refills | Status: DC

## 2021-02-28 MED ORDER — SPIRONOLACTONE 50 MG PO TABS: 50.0000 mg | ORAL_TABLET | Freq: Every day | ORAL | 3 refills | Status: DC

## 2021-02-28 MED ORDER — METOPROLOL TARTRATE 25 MG PO TABS
ORAL_TABLET | ORAL | 3 refills | Status: DC
Start: 2021-02-28 — End: 2021-09-09

## 2021-02-28 MED ORDER — AMLODIPINE BESYLATE 5 MG PO TABS: 5.0000 mg | ORAL_TABLET | Freq: Every day | ORAL | 3 refills | Status: DC

## 2021-02-28 NOTE — Progress Notes (Signed)
PCP: Shirlean Mylar, MD   Primary EP: Dr Marney Doctor III is a 55 y.o. male who presents today for routine electrophysiology followup.  Since last being seen in our clinic, the patient reports doing very well.  Today, he denies symptoms of palpitations, chest pain, shortness of breath,  lower extremity edema, dizziness, presyncope, or syncope.  The patient is otherwise without complaint today.   Past Medical History:  Diagnosis Date   Anemia, iron deficiency 01/30/2017   Cervicalgia    CVA (cerebral infarction) 05/01/12   on pradaxa at time of stroke, switched to xarelto   External thrombosed hemorrhoids    GERD (gastroesophageal reflux disease)    Guillain Barr syndrome (HCC)    following seasonal influenza vaccination   Guillain-Barre syndrome (HCC)    from stroke in 2013   Hematuria    a. 01/2012 - to f/u with urology as outpt.   Hernia of unspecified site of abdominal cavity without mention of obstruction or gangrene    of abdominal   Hx of gastric bypass 01/30/2017   2004 Dr Clent Ridges [facility] weight 450 lbs   Hypertension    Iron malabsorption 01/30/2017   Hx gastric bypass 2004   Morbid obesity s/p gastric bypass    Nonischemic cardiomyopathy (HCC)    a. 01/2012 Echo: EF 35-40%, Diff HK, mild MR;  b. 01/2012 R&L Heart Cath: mildly elevated R heart pressures (pcwp 17) , nl cors.   Osteoarthrosis, unspecified whether generalized or localized, ankle and foot    of the knees,ankle and foot   Pancytopenia    Persistent atrial fibrillation (HCC)    a. Pradaxa started 01/2012;  b .s/p TEE/DCCV 02/16/2012; c. recurrent afib->Tikosyn Initiation 03/2012   Sleep apnea    CPAP compliant   Status post bypass gastrojejunostomy 01/30/2017   Stroke (HCC)    Unspecified intestinal malabsorption    Urticaria, unspecified    Vitamin B 12 deficiency    Past Surgical History:  Procedure Laterality Date   ATRIAL FIBRILLATION ABLATION N/A 06/25/2012   PVI by Dr [name]   ATRIAL  FIBRILLATION ABLATION N/A 10/04/2017   Procedure: ATRIAL FIBRILLATION ABLATION;  Surgeon: Hillis Range, MD;  [facility]: [facility] INVASIVE CV LAB;  Service: Cardiovascular;  Laterality: N/A;   CARDIOVERSION  02/16/2012   Procedure: CARDIOVERSION;  Surgeon: [name], MD;  Location: [facility] ENDOSCOPY;  Service: Cardiovascular;  Laterality: N/A;   CARDIOVERSION  04/22/2012   Procedure: CARDIOVERSION;  Surgeon: [name], MD;  Location: [facility] ENDOSCOPY;  Service: Cardiovascular;  Laterality: N/A;   CARDIOVERSION N/A 08/22/2017   Procedure: CARDIOVERSION;  Surgeon: Chilton Si, MD;  Location: [facility] ENDOSCOPY;  Service: Cardiovascular;  Laterality: N/A;   CARDIOVERSION N/A 06/11/2018   Procedure: CARDIOVERSION;  Surgeon: Elease Hashimoto Deloris Ping, MD;  Location: [facility] ENDOSCOPY;  Service: Cardiovascular;  Laterality: N/A;   CARDIOVERSION N/A 04/29/2019   Procedure: CARDIOVERSION;  Surgeon: Sande Rives, MD;  Location: [facility] ENDOSCOPY;  Service: Endoscopy;  Laterality: N/A;   CARDIOVERSION N/A 06/18/2019   Procedure: CARDIOVERSION;  Surgeon: Chilton Si, MD;  Location: [facility] ENDOSCOPY;  Service: Cardiovascular;  Laterality: N/A;   COLONOSCOPY N/A 07/07/2016   Procedure: COLONOSCOPY;  Surgeon: Jeani Hawking, MD;  Location: [facility] ENDOSCOPY;  Service: Endoscopy;  Laterality: N/A;   ESOPHAGOGASTRODUODENOSCOPY N/A 07/07/2016   Procedure: ESOPHAGOGASTRODUODENOSCOPY (EGD);  Surgeon: Jeani Hawking, MD;  Location: [facility] ENDOSCOPY;  Service: Endoscopy;  Laterality: N/A;   GASTRIC BYPASS  2004   HERNIA REPAIR  LEFT AND RIGHT HEART CATHETERIZATION WITH CORONARY ANGIOGRAM N/A 02/13/2012   Procedure: LEFT AND RIGHT HEART CATHETERIZATION WITH CORONARY ANGIOGRAM;  Surgeon: Tonny Bollman, MD;  Location: [facility] CATH LAB;  Service: Cardiovascular;  Laterality: N/A;   TEE WITHOUT CARDIOVERSION  02/16/2012   Procedure: TRANSESOPHAGEAL ECHOCARDIOGRAM (TEE);  Surgeon: [name], MD;  Location: [name] ENDOSCOPY;  Service: Cardiovascular;   Laterality: N/A;  10a CV   TEE WITHOUT CARDIOVERSION  06/24/2012   Procedure: TRANSESOPHAGEAL ECHOCARDIOGRAM (TEE);  Surgeon: [name], MD;  Location: [facility] ENDOSCOPY;  Service: Cardiovascular;  Laterality: N/A;   TEE WITHOUT CARDIOVERSION N/A 08/22/2017   Procedure: TRANSESOPHAGEAL ECHOCARDIOGRAM (TEE);  Surgeon: Chilton Si, MD;  Location: [facility] ENDOSCOPY;  Service: Cardiovascular;  Laterality: N/A;   TEE WITHOUT CARDIOVERSION N/A 06/11/2018   Procedure: TRANSESOPHAGEAL ECHOCARDIOGRAM (TEE);  Surgeon: Vesta Mixer, MD;  Location[facility] ENDOSCOPY;  Service: Cardiovascular;  Laterality: N/A;   UMBILICAL HERNIA REPAIR     x 2    ROS- all systems are reviewed and negatives except as per HPI above  Current Outpatient Medications  Medication Sig Dispense Refill   acetaminophen (TYLENOL) 500 MG tablet Take 1,000 mg by mouth every 6 (six) hours as needed (for pain).     amLODipine (NORVASC) 5 MG tablet TAKE ONE TABLET BY MOUTH ONCE DAILY 30 tablet 1   ferrous sulfate 325 (65 FE) MG tablet Take 325 mg by mouth daily.      flecainide (TAMBOCOR) 50 MG tablet Take 1 tablet by mouth twice daily 180 tablet 3   furosemide (LASIX) 40 MG tablet Take 20 mg by mouth daily.     metoprolol tartrate (LOPRESSOR) 25 MG tablet TAKE 4 TABLETS BY MOUTH ONCE DAILY IN THE MORNING AND 3 TABLETS ONCE DAILY IN THE EVENING 630 tablet 3   Multiple Vitamin (MULTIVITAMIN) tablet Take 1-2 tablets by mouth See admin instructions. Take 2 tablets in the morning and 1 tablet in the evening     omeprazole (PRILOSEC) 40 MG capsule Take 40 mg by mouth daily.   1   rivaroxaban (XARELTO) 20 MG TABS tablet TAKE ONE TABLET BY MOUTH DAILY WITH  SUPPER 30 tablet 1   spironolactone (ALDACTONE) 50 MG tablet TAKE ONE TABLET BY MOUTH ONCE DAILY 30 tablet 1   vitamin E 180 MG (400 UNITS) capsule Take 400 Units by mouth daily.     No current facility-administered medications for this visit.   Facility-Administered Medications Ordered  in Other Visits  Medication Dose Route Frequency Provider Last Rate Last Admin   gadopentetate dimeglumine (MAGNEVIST) injection 20 mL  20 mL Intravenous Once PRN Anson Fret, MD        Physical Exam: Vitals:   02/28/21 1214  BP: 120/74  Pulse: (!) 49  SpO2: 96%  Weight: 195 lb 9.6 oz (88.7 kg)  Height: 5\' 6"  (1.676 m)    GEN- The patient is well appearing, alert and oriented x 3 today.   Head- normocephalic, atraumatic Eyes-  Sclera clear, conjunctiva pink Ears- hearing intact Oropharynx- clear Lungs- Clear to ausculation bilaterally, normal work of breathing Heart- Regular rate and rhythm, no murmurs, rubs or gallops, PMI not laterally displaced GI- soft, NT, ND, + BS Extremities- no clubbing, cyanosis, or edema  Wt Readings from Last 3 Encounters:  02/28/21 195 lb 9.6 oz (88.7 kg)  05/08/20 213 lb (96.6 kg)  04/12/20 218 lb (98.9 kg)   Echo 11/03/19- EF 55%  EKG tracing ordered today is personally reviewed and shows  sinus bradycardia 49 bpm  Assessment and Plan:  Persistent afib/ atypical atiral flutter Well controlled post ablation x 2.  He ison low dose flecainide Continue xarelto given prior stroke Reduce metorolol to 50mg  BID x 8 weeks, then 25mg  BID  2. HTN Continue spironolactone 50mg  daily Labs 10/21 reviewed  3. Tachycardia mediated CM Resolved with sinus  Risks, benefits and potential toxicities for medications prescribed and/or refilled reviewed with patient today.   Return in 6 months  MD, [facility] 02/28/2021 12:26 PM

## 2021-02-28 NOTE — Patient Instructions (Addendum)
Medication Instructions:  Reduce Metoprolol to 50 mg two times a day for 8 weeks. Then reduce to 25 mg two times a day Your physician recommends that you continue on your current medications as directed. Please refer to the Current Medication list given to you today.  Labwork: None ordered.  Testing/Procedures: None ordered.  Follow-Up: Your physician wants you to follow-up in: 09/05/21 at 10:30 am with Hillis Range, MD    Any Other Special Instructions Will Be Listed Below (If Applicable).  If you need a refill on your cardiac medications before your next appointment, please call your pharmacy.

## 2021-03-22 ENCOUNTER — Telehealth: Payer: Self-pay | Admitting: Internal Medicine

## 2021-03-22 NOTE — Telephone Encounter (Signed)
Patient c/o Palpitations:  High priority if patient c/o lightheadedness, shortness of breath, or chest pain  How long have you had palpitations/irregular HR/ Afib? Are you having the symptoms now? Since Sunday/ Apple watch notification   Are you currently experiencing lightheadedness, SOB or CP? no  Do you have a history of afib (atrial fibrillation) or irregular heart rhythm? yes  Have you checked your BP or HR? (document readings if available): Yes 92/64 hr 63, 109/75 hr 58, 107/74 hr 53, 92/64 hr 43, 86/63 hr 49  Are you experiencing any other symptoms? no

## 2021-03-22 NOTE — Telephone Encounter (Signed)
Call returned to Pt.  Per Pt he went back into afib on Sunday per his watch.  He states his heart rate has been controlled-highest heart rate was in the 70's.  He is currently taking  metoprolol tartrate 50 mg PO BID.  He is asking if he should do something different.  Metoprolol tartrate was just reduced at last office visit.

## 2021-03-23 NOTE — Telephone Encounter (Signed)
Left message to call back  

## 2021-03-23 NOTE — Telephone Encounter (Signed)
Dr. [name] reviewed the EKG. NSR with PAC's. Keep plan to continue to reduce dose of Metoprolol tartrate to 25 mg BID on Oct. 3. Verbalized agreement and understanding.

## 2021-04-06 ENCOUNTER — Other Ambulatory Visit: Payer: Self-pay | Admitting: Internal Medicine

## 2021-04-06 NOTE — Telephone Encounter (Signed)
Pt last saw Dr [name] 02/28/21, last labs 05/08/20 Creat 0.78, age 55, weight 88.7kg, CrCl 132.67, based on CrCl pt is on appropriate dosage of Xarelto 20mg  QD for afib.  Will refill rx.

## 2021-04-25 DIAGNOSIS — Z85828 Personal history of other malignant neoplasm of skin: Secondary | ICD-10-CM | POA: Diagnosis not present

## 2021-04-25 DIAGNOSIS — L570 Actinic keratosis: Secondary | ICD-10-CM | POA: Diagnosis not present

## 2021-04-25 DIAGNOSIS — L578 Other skin changes due to chronic exposure to nonionizing radiation: Secondary | ICD-10-CM | POA: Diagnosis not present

## 2021-05-05 ENCOUNTER — Other Ambulatory Visit: Payer: Self-pay | Admitting: Internal Medicine

## 2021-09-05 ENCOUNTER — Ambulatory Visit: Payer: BC Managed Care – PPO | Admitting: Internal Medicine

## 2021-09-09 ENCOUNTER — Ambulatory Visit (INDEPENDENT_AMBULATORY_CARE_PROVIDER_SITE_OTHER): Payer: BC Managed Care – PPO | Admitting: Internal Medicine

## 2021-09-09 ENCOUNTER — Encounter: Payer: Self-pay | Admitting: Internal Medicine

## 2021-09-09 ENCOUNTER — Other Ambulatory Visit: Payer: Self-pay

## 2021-09-09 VITALS — BP 112/74 | HR 51 | Ht 66.0 in | Wt 188.2 lb

## 2021-09-09 DIAGNOSIS — I10 Essential (primary) hypertension: Secondary | ICD-10-CM | POA: Diagnosis not present

## 2021-09-09 DIAGNOSIS — I4819 Other persistent atrial fibrillation: Secondary | ICD-10-CM

## 2021-09-09 DIAGNOSIS — D6869 Other thrombophilia: Secondary | ICD-10-CM

## 2021-09-09 MED ORDER — METOPROLOL TARTRATE 25 MG PO TABS: 12.5000 mg | ORAL_TABLET | Freq: Two times a day (BID) | ORAL | 3 refills | Status: DC

## 2021-09-09 MED ORDER — SPIRONOLACTONE 25 MG PO TABS: 25.0000 mg | ORAL_TABLET | Freq: Every day | ORAL | 3 refills | Status: DC

## 2021-09-09 NOTE — Patient Instructions (Addendum)
Medication Instructions:  Your physician has recommended you make the following change in your medication:    REDUCE your metoprolol tartrate 25 mg-  Take 1/2 tablet (12.5 mg) by mouth twice a day  2.    REDUCE your spironolactone-  Take 25 mg by mouth daily   Labwork: None ordered.  Testing/Procedures: None ordered.  Follow-Up: Your physician wants you to follow-up in: one year with Francis Dowse PA You will receive a reminder letter in the mail two months in advance. If you don't receive a letter, please call our office to schedule the follow-up appointment.    Any Other Special Instructions Will Be Listed Below (If Applicable).  If you need a refill on your cardiac medications before your next appointment, please call your pharmacy.

## 2021-09-09 NOTE — Progress Notes (Signed)
PCP: Maurice Small, MD   Primary EP: Dr Sabino Gasser III is a 56 y.o. male who presents today for routine electrophysiology followup.  Since last being seen in our clinic, the patient reports doing very well.  Today, he denies symptoms of palpitations, chest pain, shortness of breath,  lower extremity edema, dizziness, presyncope, or syncope.  The patient is otherwise without complaint today.   Past Medical History:  Diagnosis Date   Anemia, iron deficiency 01/30/2017   Cervicalgia    CVA (cerebral infarction) 05/01/12   on pradaxa at time of stroke, switched to xarelto   External thrombosed hemorrhoids    GERD (gastroesophageal reflux disease)    Guillain Barr syndrome ([place])    following seasonal influenza vaccination   Guillain-Barre syndrome ([place])    from stroke in 2013   Hematuria    a. 01/2012 - to f/u with urology as outpt.   Hernia of unspecified site of abdominal cavity without mention of obstruction or gangrene    of abdominal   Hx of gastric bypass 01/30/2017   2004 Dr Volanda Napoleon [facility] weight 450 lbs   Hypertension    Iron malabsorption 01/30/2017   Hx gastric bypass 2004   Morbid obesity s/p gastric bypass    Nonischemic cardiomyopathy ([place])    a. 01/2012 Echo: EF 35-40%, Diff HK, mild MR;  b. 01/2012 R&L Heart Cath: mildly elevated R heart pressures (pcwp 17) , nl cors.   Osteoarthrosis, unspecified whether generalized or localized, ankle and foot    of the knees,ankle and foot   Pancytopenia    Persistent atrial fibrillation ([place])    a. Pradaxa started 01/2012;  b .s/p TEE/DCCV 02/16/2012; c. recurrent afib->Tikosyn Initiation 03/2012   Sleep apnea    CPAP compliant   Status post bypass gastrojejunostomy 01/30/2017   Stroke ([place])    Unspecified intestinal malabsorption    Urticaria, unspecified    Vitamin B 12 deficiency    Past Surgical History:  Procedure Laterality Date   ATRIAL FIBRILLATION ABLATION N/A 06/25/2012   PVI by Dr Rayann Heman   ATRIAL  FIBRILLATION ABLATION N/A 10/04/2017   Procedure: [place];  Surgeon: Thompson Grayer, MD;  Location: [place] CV LAB;  Service: Cardiovascular;  Laterality: N/A;   CARDIOVERSION  02/16/2012   Procedure: CARDIOVERSION;  Surgeon: Lelon Perla, MD;  Location: [facility] ENDOSCOPY;  Service: Cardiovascular;  Laterality: N/A;   CARDIOVERSION  04/22/2012   Procedure: CARDIOVERSION;  Surgeon: Deboraha Sprang, MD;  Location: [place];  Service: Cardiovascular;  Laterality: N/A;   CARDIOVERSION N/A 08/22/2017   Procedure: CARDIOVERSION;  Surgeon: Skeet Latch, MD;  Location: [facility] ENDOSCOPY;  Service: Cardiovascular;  Laterality: N/A;   CARDIOVERSION N/A 06/11/2018   Procedure: CARDIOVERSION;  Surgeon: Acie Fredrickson Wonda Cheng, MD;  Location: [facility] ENDOSCOPY;  Service: Cardiovascular;  Laterality: N/A;   CARDIOVERSION N/A 04/29/2019   Procedure: CARDIOVERSION;  Surgeon: Geralynn Rile, MD;  Location: [place];  Service: Endoscopy;  Laterality: N/A;   CARDIOVERSION N/A 06/18/2019   Procedure: CARDIOVERSION;  Surgeon: Skeet Latch, MD;  Location: [facility] ENDOSCOPY;  Service: Cardiovascular;  Laterality: N/A;   COLONOSCOPY N/A 07/07/2016   Procedure: COLONOSCOPY;  Surgeon: Carol Ada, MD;  Location: [facility] ENDOSCOPY;  Service: Endoscopy;  Laterality: N/A;   ESOPHAGOGASTRODUODENOSCOPY N/A 07/07/2016   Procedure: ESOPHAGOGASTRODUODENOSCOPY (EGD);  Surgeon: Carol Ada, MD;  Location: [facility] ENDOSCOPY;  Service: Endoscopy;  Laterality: N/A;   GASTRIC BYPASS  2004   HERNIA REPAIR  LEFT AND RIGHT HEART CATHETERIZATION WITH CORONARY ANGIOGRAM N/A 02/13/2012   Procedure: LEFT AND RIGHT HEART CATHETERIZATION WITH CORONARY ANGIOGRAM;  Surgeon: Sherren Mocha, MD;  Location: [facility] CATH LAB;  Service: Cardiovascular;  Laterality: N/A;   TEE WITHOUT CARDIOVERSION  02/16/2012   Procedure: TRANSESOPHAGEAL ECHOCARDIOGRAM (TEE);  Surgeon: Lelon Perla, MD;  Location: [facility] ENDOSCOPY;  Service: Cardiovascular;   Laterality: N/A;  10a CV   TEE WITHOUT CARDIOVERSION  06/24/2012   Procedure: TRANSESOPHAGEAL ECHOCARDIOGRAM (TEE);  Surgeon: Peter M Martinique, MD;  Location: [facility] ENDOSCOPY;  Service: Cardiovascular;  Laterality: N/A;   TEE WITHOUT CARDIOVERSION N/A 08/22/2017   Procedure: TRANSESOPHAGEAL ECHOCARDIOGRAM (TEE);  Surgeon: Skeet Latch, MD;  Location: [place];  Service: Cardiovascular;  Laterality: N/A;   TEE WITHOUT CARDIOVERSION N/A 06/11/2018   Procedure: TRANSESOPHAGEAL ECHOCARDIOGRAM (TEE);  Surgeon: Thayer Headings, MD;  Location: [facility] ENDOSCOPY;  Service: Cardiovascular;  Laterality: N/A;   UMBILICAL HERNIA REPAIR     x 2    ROS- all systems are reviewed and negatives except as per HPI above  Current Outpatient Medications  Medication Sig Dispense Refill   acetaminophen (TYLENOL) 500 MG tablet Take 1,000 mg by mouth every 6 (six) hours as needed (for pain).     amLODipine (NORVASC) 5 MG tablet Take 1 tablet (5 mg total) by mouth daily. 90 tablet 3   ferrous sulfate 325 (65 FE) MG tablet Take 325 mg by mouth daily.      flecainide (TAMBOCOR) 50 MG tablet Take 1 tablet by mouth twice daily 180 tablet 3   furosemide (LASIX) 40 MG tablet Take 0.5 tablets (20 mg total) by mouth daily. 45 tablet 3   metoprolol tartrate (LOPRESSOR) 25 MG tablet Take 2 tablets (50 mg total) by mouth 2 (two) times daily for 56 days, THEN 1 tablet (25 mg total) 2 (two) times daily. 180 tablet 3   Multiple Vitamin (MULTIVITAMIN) tablet Take 1-2 tablets by mouth See admin instructions. Take 2 tablets in the morning and 1 tablet in the evening     omeprazole (PRILOSEC) 40 MG capsule Take 40 mg by mouth daily.   1   rivaroxaban (XARELTO) 20 MG TABS tablet TAKE 1 TABLET BY MOUTH ONCE DAILY WITH SUPPER 30 tablet 5   spironolactone (ALDACTONE) 50 MG tablet Take 1 tablet (50 mg total) by mouth daily. 90 tablet 3   vitamin E 180 MG (400 UNITS) capsule Take 400 Units by mouth daily.     No current  facility-administered medications for this visit.   Facility-Administered Medications Ordered in Other Visits  Medication Dose Route Frequency Provider Last Rate Last Admin   gadopentetate dimeglumine (MAGNEVIST) injection 20 mL  20 mL Intravenous Once PRN Melvenia Beam, MD        Physical Exam: Vitals:   09/09/21 1601  BP: 112/74  Pulse: (!) 51  SpO2: 95%  Weight: 188 lb 3.2 oz (85.4 kg)  Height: 5\' 6"  (1.676 m)    GEN- The patient is well appearing, alert and oriented x 3 today.   Head- normocephalic, atraumatic Eyes-  Sclera clear, conjunctiva pink Ears- hearing intact Oropharynx- clear Lungs- Clear to ausculation bilaterally, normal work of breathing Heart- Regular rate and rhythm, no murmurs, rubs or gallops, PMI not laterally displaced GI- soft, NT, ND, + BS Extremities- no clubbing, cyanosis, or edema  Wt Readings from Last 3 Encounters:  09/09/21 188 lb 3.2 oz (85.4 kg)  02/28/21 195 lb 9.6 oz (88.7 kg)  05/08/20 213 lb (96.6  kg)    EKG tracing ordered today is personally reviewed and shows sinus bradycardia  Assessment and Plan:  Persistent atrial fibrillation/ atypical atrial flutter Well controlled post ablation He wishes to continue flecainide Continue xarelto given prior stroke reduce metoprolol to 12.5mg  BID  2. HTN Reduce metoprolol as above Reduce spironolactone to 25mg  daily I have advised that he follow up with PCP for repeat bmet and BP check  3. Tachycardia mediated CM Resolved with sinus  Return to see EP APP in a year  Thompson Grayer MD, [facility] 09/09/2021 4:23 PM

## 2021-09-29 ENCOUNTER — Other Ambulatory Visit: Payer: Self-pay | Admitting: Internal Medicine

## 2021-09-29 DIAGNOSIS — I4819 Other persistent atrial fibrillation: Secondary | ICD-10-CM

## 2021-09-29 NOTE — Telephone Encounter (Signed)
Called pt's wife and made her aware we are still waiting to hear from PCP office to see if pt can have labs drawn tomorrow at PCP appt. [name] stated she would actually prefer labs to be drawn in [place] on Monday (10/03/21). Orders placed for CBC and BMET. [name] also stated Mr Swenor had 10 more tablets of Xarelto and did not need a refill until labs are resulted.  ?

## 2021-09-29 NOTE — Telephone Encounter (Signed)
[name] returned phone call and stated pt has an appt tomorrow with new PCP Dr Chanetta Marshall and asking if pt can have labs drawn there. ? ? Called PCP's office and requested CBC & BMET be draw at pt's appt tomorrow. Staff stated they "do not typically draw labs for other offices"; however, they will forward the message to Dr Christena Flake nurse and call [facility] back as soon as possible.  ?

## 2021-09-29 NOTE — Telephone Encounter (Signed)
Prescription refill request for Xarelto received.  ?Indication: Afib  ?Last office visit: 09/09/21 ([facility]) ?Weight: 85.4kg ?Age: 56 ?Scr: 0.78 (05/08/20) ?CrCl:  ? ?Pt's labs overdue. Called pt to schedule lab appt, no answer. Left message on voicemail to call back.  ? ? ? ? ? ? ?

## 2021-10-05 NOTE — Telephone Encounter (Signed)
Prescription refill request for Xarelto received.  ?Indication: Afib  ?Last office visit: 09/09/21 ([facility]) ?Weight: 85.4kg ?Age: 56 ?Scr: 0.85 (09/30/21 via [facility]) ?CrCl: 117.80ml/min ? ?Pt had labs drawn at PCP office on 09/30/21 ? ?Appropriate dose and refill sent to requested pharmacy.  ? ?  ?

## 2021-10-10 ENCOUNTER — Telehealth: Payer: Self-pay

## 2021-10-10 NOTE — Telephone Encounter (Signed)
Patient with diagnosis of afib on Xarelto for anticoagulation.   ? ?Procedure:  EGD and Colonoscopy ?Date of procedure: 10/21/21 ? ? ?CHA2DS2-VASc Score = 4  ? This indicates a 4.8% annual risk of stroke. ?The patient's score is based upon: ?CHF History: 1 ?HTN History: 1 ?Diabetes History: 0 ?Stroke History: 2 ?Vascular Disease History: 0 ?Age Score: 0 ?Gender Score: 0 ?  ?  ? ?CrCl 99 ml/min ? ?Per office protocol, patient can hold Xarelto for 1 day prior to procedure.   ? ? ?

## 2021-10-10 NOTE — Telephone Encounter (Signed)
? ?  Pre-operative Risk Assessment  ?  ?Patient Name: Tyrone [name]  ?DOB: [birth year] ?MRN: [id number]  ? ?  ? ?Request for Surgical Clearance   ? ?Procedure:   EGD and Colonoscopy ? ?Date of Surgery:  Clearance 10/21/21                              ?   ?Surgeon:  Charna Elizabeth, MD ?Surgeon's Group or Practice Name:  [facility] ?Phone number:  [phone] ?Fax number:  [phone] ?  ?Type of Clearance Requested:   ?- Medical  ?- Pharmacy:  Hold Rivaroxaban (Xarelto)   ?  ?Type of Anesthesia:   Propofol ?  ?Additional requests/questions:     ? ?Signed, ?Kristian Hazzard   ?10/10/2021, 4:04 PM  ? ?

## 2021-10-11 ENCOUNTER — Telehealth: Payer: Self-pay

## 2021-10-11 NOTE — Telephone Encounter (Signed)
?  Patient Consent for Virtual Visit  ? ? ?   ? ?Tyrone [name] has provided verbal consent on 10/11/2021 for a virtual visit (video or telephone). ? ? ?CONSENT FOR VIRTUAL VISIT FOR:  Tyrone [name]  ?By participating in this virtual visit I agree to the following: ? ?I hereby voluntarily request, consent and authorize [facility] and its employed or contracted physicians, physician assistants, nurse practitioners or other licensed health care professionals (the Practitioner), to provide me with telemedicine health care services (the ?Services") as deemed necessary by the treating Practitioner. I acknowledge and consent to receive the Services by the Practitioner via telemedicine. I understand that the telemedicine visit will involve communicating with the Practitioner through live audiovisual communication technology and the disclosure of certain medical information by electronic transmission. I acknowledge that I have been given the opportunity to request an in-person assessment or other available alternative prior to the telemedicine visit and am voluntarily participating in the telemedicine visit. ? ?I understand that I have the right to withhold or withdraw my consent to the use of telemedicine in the course of my care at any time, without affecting my right to future care or treatment, and that the Practitioner or I may terminate the telemedicine visit at any time. I understand that I have the right to inspect all information obtained and/or recorded in the course of the telemedicine visit and may receive copies of available information for a reasonable fee.  I understand that some of the potential risks of receiving the Services via telemedicine include:  ?Delay or interruption in medical evaluation due to technological equipment failure or disruption; ?Information transmitted may not be sufficient (e.g. poor resolution of images) to allow for appropriate medical decision making by the Practitioner;  and/or  ?In rare instances, security protocols could fail, causing a breach of personal health information. ? ?Furthermore, I acknowledge that it is my responsibility to provide information about my medical history, conditions and care that is complete and accurate to the best of my ability. I acknowledge that Practitioner's advice, recommendations, and/or decision may be based on factors not within their control, such as incomplete or inaccurate data provided by me or distortions of diagnostic images or specimens that may result from electronic transmissions. I understand that the practice of medicine is not an exact science and that Practitioner makes no warranties or guarantees regarding treatment outcomes. I acknowledge that a copy of this consent can be made available to me via my patient portal ([facility]), or I can request a printed copy by calling the office of [name].   ? ?I understand that my insurance will be billed for this visit.  ? ?I have read or had this consent read to me. ?I understand the contents of this consent, which adequately explains the benefits and risks of the Services being provided via telemedicine.  ?I have been provided ample opportunity to ask questions regarding this consent and the Services and have had my questions answered to my satisfaction. ?I give my informed consent for the services to be provided through the use of telemedicine in my medical care ? ? ? ? ?

## 2021-10-11 NOTE — Telephone Encounter (Signed)
? ? ?  Name: Tyrone [name]  ?DOB: [birth year]  ?MRN: [id number] ? ?Primary Cardiologist: Hillis Range, MD ? ? ?Preoperative team, please contact this patient and set up a phone call appointment for further preoperative risk assessment. Please obtain consent and complete medication review. Thank you for your help. ? ? ?Marcelino Duster, PA-C ?10/11/2021, 6:32 AM ?[phone] ?[facility] ?[address] ?[place], [place] [address] ? ? ?

## 2021-10-11 NOTE — Telephone Encounter (Signed)
Pt is scheduled for a Tele-Visit with our Pre-op team on 10/12/2021 for surgical clearance.  ?

## 2021-10-12 ENCOUNTER — Other Ambulatory Visit: Payer: Self-pay

## 2021-10-12 ENCOUNTER — Ambulatory Visit (INDEPENDENT_AMBULATORY_CARE_PROVIDER_SITE_OTHER): Payer: BC Managed Care – PPO | Admitting: General Practice

## 2021-10-12 DIAGNOSIS — Z01810 Encounter for preprocedural cardiovascular examination: Secondary | ICD-10-CM

## 2021-10-12 NOTE — Progress Notes (Signed)
? ?Virtual Visit via Telephone Note  ? ?This visit type was conducted due to national recommendations for restrictions regarding the COVID-19 Pandemic (e.g. social distancing) in an effort to limit this patient's exposure and mitigate transmission in our community.  Due to his co-morbid illnesses, this patient is at least at moderate risk for complications without adequate follow up.  This format is felt to be most appropriate for this patient at this time.  The patient did not have access to video technology/had technical difficulties with video requiring transitioning to audio format only (telephone).  All issues noted in this document were discussed and addressed.  No physical exam could be performed with this format.  Please refer to the patient's chart for his  consent to telehealth for [facility]. ?Evaluation Performed:  Preoperative cardiovascular risk assessment ? ?This visit type was conducted due to national recommendations for restrictions regarding the COVID-19 Pandemic (e.g. social distancing).  This format is felt to be most appropriate for this patient at this time.  All issues noted in this document were discussed and addressed.  No physical exam was performed (except for noted visual exam findings with Video Visits).  Please refer to the patient's chart (MyChart message for video visits and phone note for telephone visits) for the patient's consent to telehealth for [facility] ?_____________  ? ?Date:  10/12/2021  ? ?Patient ID:  Tyrone [name], Tyrone [name] Apr 14, 1966, MRN [id number] ?Patient Location:  ?Home ?Provider location:   ?Office ? ?Primary Care Provider:  Shirlean Mylar, MD ?Primary Cardiologist:  Hillis Range, MD ? ?Chief Complaint  ?  ?56 y.o. y/o male with a h/o atrial fibrillation, HTN GERD, Guillain-Barr? syndrome, who is pending colonoscopy/EGD, and presents today for telephonic preoperative cardiovascular risk assessment. ? ?Past Medical History  ?  ?Past Medical History:  ?Diagnosis  Date  ? Anemia, iron deficiency 01/30/2017  ? Cervicalgia   ? CVA (cerebral infarction) 05/01/12  ? on pradaxa at time of stroke, switched to xarelto  ? External thrombosed hemorrhoids   ? GERD (gastroesophageal reflux disease)   ? Guillain Barr? syndrome (HCC)   ? following seasonal influenza vaccination  ? Guillain-Barre syndrome (HCC)   ? from stroke in 2013  ? Hematuria   ? a. 01/2012 - to f/u with urology as outpt.  ? Hernia of unspecified site of abdominal cavity without mention of obstruction or gangrene   ? of abdominal  ? Hx of gastric bypass 01/30/2017  ? 2004 Dr Clent Ridges [facility] weight 450 lbs  ? Hypertension   ? Iron malabsorption 01/30/2017  ? Hx gastric bypass 2004  ? Morbid obesity s/p gastric bypass   ? Nonischemic cardiomyopathy (HCC)   ? a. 01/2012 Echo: EF 35-40%, Diff HK, mild MR;  b. 01/2012 R&L Heart Cath: mildly elevated R heart pressures (pcwp 17) , nl cors.  ? Osteoarthrosis, unspecified whether generalized or localized, ankle and foot   ? of the knees,ankle and foot  ? Pancytopenia   ? Persistent atrial fibrillation (HCC)   ? a. Pradaxa started 01/2012;  b .s/p TEE/DCCV 02/16/2012; c. recurrent afib->Tikosyn Initiation 03/2012  ? Sleep apnea   ? CPAP compliant  ? Status post bypass gastrojejunostomy 01/30/2017  ? Stroke [facility])   ? Unspecified intestinal malabsorption   ? Urticaria, unspecified   ? Vitamin B 12 deficiency   ? ?Past Surgical History:  ?Procedure Laterality Date  ? ATRIAL FIBRILLATION ABLATION N/A 06/25/2012  ? PVI by Dr [name]  ? ATRIAL FIBRILLATION  ABLATION N/A 10/04/2017  ? Procedure: ATRIAL FIBRILLATION ABLATION;  Surgeon: Hillis Range, MD;  Location: [facility] INVASIVE CV LAB;  Service: Cardiovascular;  Laterality: N/A;  ? CARDIOVERSION  02/16/2012  ? Procedure: CARDIOVERSION;  Surgeon: [name], MD;  Location: [facility] ENDOSCOPY;  Service: Cardiovascular;  Laterality: N/A;  ? CARDIOVERSION  04/22/2012  ? Procedure: CARDIOVERSION;  Surgeon: [name], MD;  Location: [facility]  ENDOSCOPY;  Service: Cardiovascular;  Laterality: N/A;  ? CARDIOVERSION N/A 08/22/2017  ? Procedure: CARDIOVERSION;  Surgeon: Chilton Si, MD;  Location: [facility] ENDOSCOPY;  Service: Cardiovascular;  Laterality: N/A;  ? CARDIOVERSION N/A 06/11/2018  ? Procedure: CARDIOVERSION;  Surgeon: Vesta Mixer, MD;  Location: [facility] ENDOSCOPY;  Service: Cardiovascular;  Laterality: N/A;  ? CARDIOVERSION N/A 04/29/2019  ? Procedure: CARDIOVERSION;  Surgeon: Sande Rives, MD;  Location: [facility] ENDOSCOPY;  Service: Endoscopy;  Laterality: N/A;  ? CARDIOVERSION N/A 06/18/2019  ? Procedure: CARDIOVERSION;  Surgeon: Chilton Si, MD;  Location: [facility] ENDOSCOPY;  Service: Cardiovascular;  Laterality: N/A;  ? COLONOSCOPY N/A 07/07/2016  ? Procedure: COLONOSCOPY;  Surgeon: Jeani Hawking, MD;  Location: [facility] ENDOSCOPY;  Service: Endoscopy;  Laterality: N/A;  ? ESOPHAGOGASTRODUODENOSCOPY N/A 07/07/2016  ? Procedure: ESOPHAGOGASTRODUODENOSCOPY (EGD);  Surgeon: Jeani Hawking, MD;  Location: [facility] ENDOSCOPY;  Service: Endoscopy;  Laterality: N/A;  ? GASTRIC BYPASS  2004  ? HERNIA REPAIR    ? LEFT AND RIGHT HEART CATHETERIZATION WITH CORONARY ANGIOGRAM N/A 02/13/2012  ? Procedure: LEFT AND RIGHT HEART CATHETERIZATION WITH CORONARY ANGIOGRAM;  Surgeon: Tonny Bollman, MD;  Location: [facility] CATH LAB;  Service: Cardiovascular;  Laterality: N/A;  ? TEE WITHOUT CARDIOVERSION  02/16/2012  ? Procedure: TRANSESOPHAGEAL ECHOCARDIOGRAM (TEE);  Surgeon: [name], MD;  Location: [facility] ENDOSCOPY;  Service: Cardiovascular;  Laterality: N/A;  10a CV  ? TEE WITHOUT CARDIOVERSION  06/24/2012  ? Procedure: TRANSESOPHAGEAL ECHOCARDIOGRAM (TEE);  Surgeon: [name], MD;  Location: [facility] ENDOSCOPY;  Service: Cardiovascular;  Laterality: N/A;  ? TEE WITHOUT CARDIOVERSION N/A 08/22/2017  ? Procedure: TRANSESOPHAGEAL ECHOCARDIOGRAM (TEE);  Surgeon: Chilton Si, MD;  Location: [facility] ENDOSCOPY;  Service: Cardiovascular;  Laterality: N/A;  ? TEE WITHOUT CARDIOVERSION  N/A 06/11/2018  ? Procedure: TRANSESOPHAGEAL ECHOCARDIOGRAM (TEE);  Surgeon: Elease Hashimoto Deloris Ping, MD;  Location: [facility] ENDOSCOPY;  Service: Cardiovascular;  Laterality: N/A;  ? UMBILICAL HERNIA REPAIR    ? x 2  ? ? ?Allergies ? ?No Known Allergies ? ?History of Present Illness  ?  ?Tyrone [name] is a 56 y.o. male who presents via audio/video conferencing for a telehealth visit today.  Pt was last seen in cardiology clinic on 09/09/2021, by Dr. [name].  At that time Tyrone [name] was doing well .  he is now pending EGD/colonoscopy.  Since his last visit, he continues to remain stable from a cardiac standpoint. ? ?Today he denies chest pain, shortness of breath, lower extremity edema, fatigue, palpitations, melena, hematuria, hemoptysis, diaphoresis, weakness, presyncope, syncope, orthopnea, and PND. ? ? ? ?Home Medications  ?  ?Prior to Admission medications   ?Medication Sig Start Date End Date Taking? Authorizing Provider  ?acetaminophen (TYLENOL) 500 MG tablet Take 1,000 mg by mouth every 6 (six) hours as needed (for pain).    [provider]  ?amLODipine (NORVASC) 5 MG tablet Take 1 tablet (5 mg total) by mouth daily. 02/28/21   Allred, Fayrene Fearing, MD  ?ferrous sulfate 325 (65 FE) MG tablet Take 325 mg by mouth daily.     [provider]  ?  flecainide (TAMBOCOR) 50 MG tablet Take 1 tablet by mouth twice daily 05/05/21   Allred, Fayrene Fearing, MD  ?folic acid (FOLVITE) 1 MG tablet Take 1 tablet by mouth daily.    [provider]  ?furosemide (LASIX) 40 MG tablet Take 0.5 tablets (20 mg total) by mouth daily. 02/28/21   Allred, Fayrene Fearing, MD  ?metoprolol tartrate (LOPRESSOR) 25 MG tablet Take 0.5 tablets (12.5 mg total) by mouth 2 (two) times daily. 09/09/21   Allred, Fayrene Fearing, MD  ?Multiple Vitamin (MULTIVITAMIN) tablet Take 1-2 tablets by mouth See admin instructions. Take 2 tablets in the morning and 1 tablet in the evening    [provider]  ?omeprazole (PRILOSEC) 40 MG capsule Take 40 mg by mouth  daily.  09/02/16   [provider]  ?rivaroxaban (XARELTO) 20 MG TABS tablet TAKE 1 TABLET BY MOUTH ONCE DAILY WITH SUPPER 10/05/21   Allred, Fayrene Fearing, MD  ?spironolactone (ALDACTONE) 25 MG tablet Take 1 tabl

## 2022-03-26 ENCOUNTER — Other Ambulatory Visit: Payer: Self-pay | Admitting: Internal Medicine

## 2022-03-28 NOTE — Telephone Encounter (Signed)
Prescription refill request for Xarelto received.  Indication: AF/CVA Last office visit: 10/12/21  Sherlean Foot NP Weight: 85.4kg Age: 56 Scr: 0.85 on 09/30/21 CrCl: 115.82  Based on above findings Xarelto 20mg  daily is the appropriate dose.  Refill approved.

## 2022-04-02 ENCOUNTER — Other Ambulatory Visit: Payer: Self-pay | Admitting: Internal Medicine

## 2022-05-15 ENCOUNTER — Other Ambulatory Visit: Payer: Self-pay | Admitting: Internal Medicine

## 2022-06-04 IMAGING — CT CT L SPINE W/O CM
3 series · 13 of 33 positions shown, 16 images · non-contrast
Comparison: CT dated February 12, 2012.  CT dated March 23, 2016.

CLINICAL DATA: Flank pain. Right-sided flank pain and anterior
thigh pain x3 days.

EXAM:
CT ABDOMEN AND PELVIS WITHOUT CONTRAST
CT LUMBAR SPINE WITHOUT CONTRAST
TECHNIQUE: Multidetector CT imaging of the abdomen and pelvis was performed
following the standard protocol without IV contrast.
Multiplanar CT images of the lumbar spine were reconstructed from
contemporary CT of the Abdomen, and Pelvis

[Series 1: lumbar st · axial · 0.38mm/px · z∈[-291,-131]mm · 5 of 46 slices shown, 7 images (1 of 3)]
[im 7/46  soft-tissue]
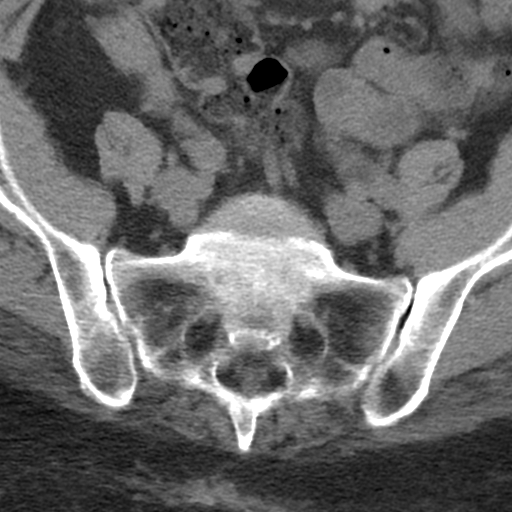
[im 7/46  bone]
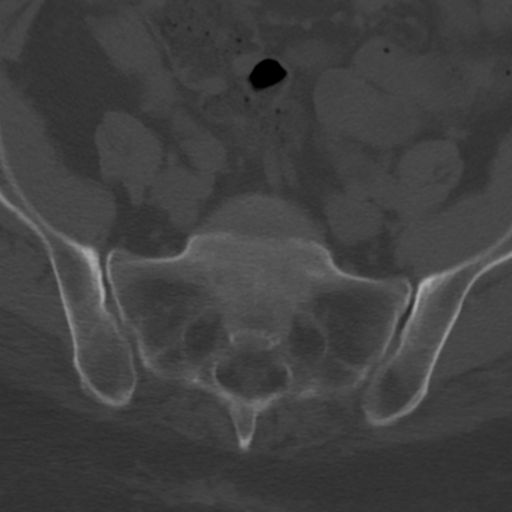
[im 14/46  bone]
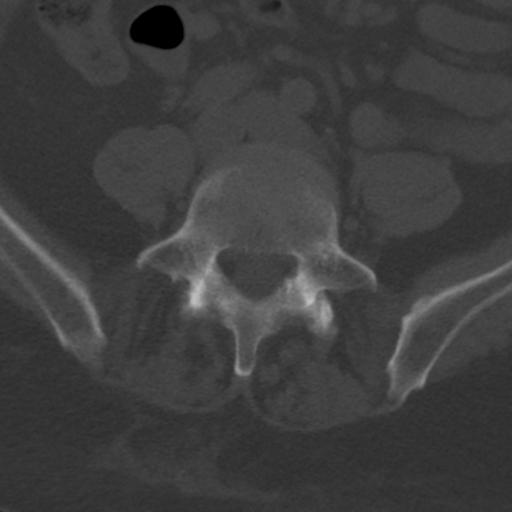
[im 25/46  bone]
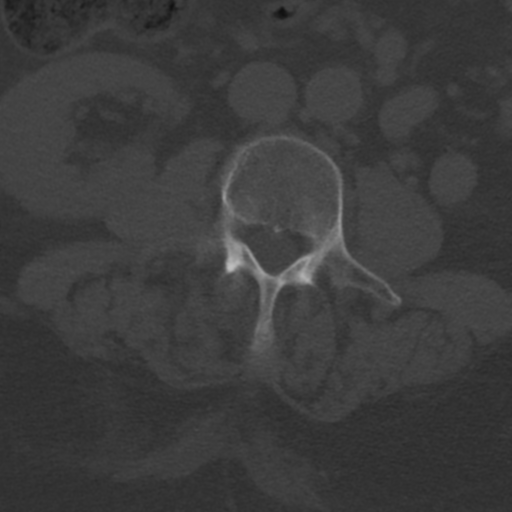
[im 32/46  bone]
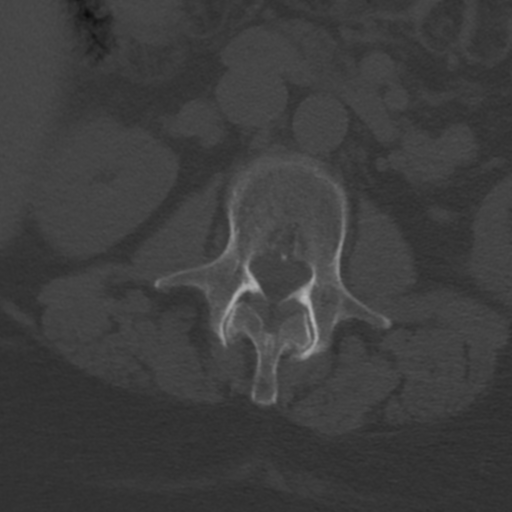
[im 39/46  soft-tissue]
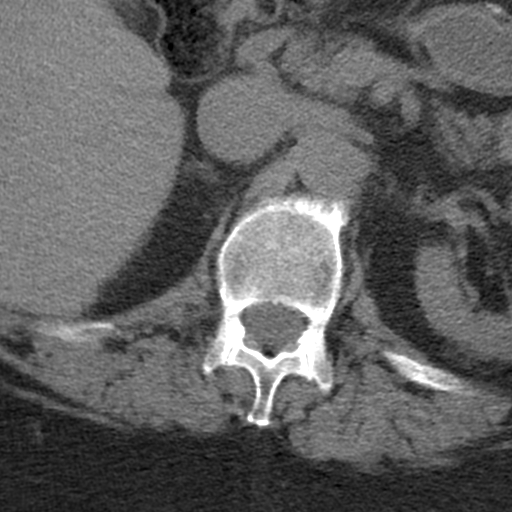
[im 39/46  bone]
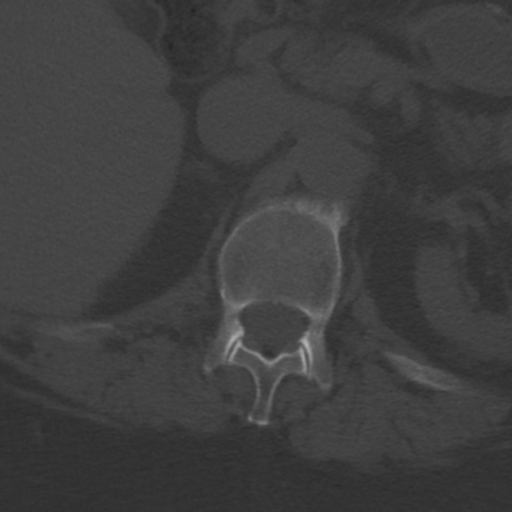

[Series 7: lumbar st · coronal · 0.36mm/px · 3 of 87 slices shown (2 of 3)]
[im 18/87  bone]
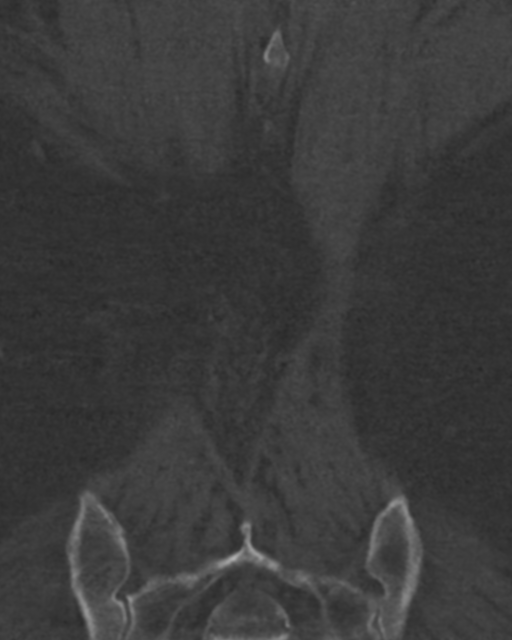
[im 35/87  bone]
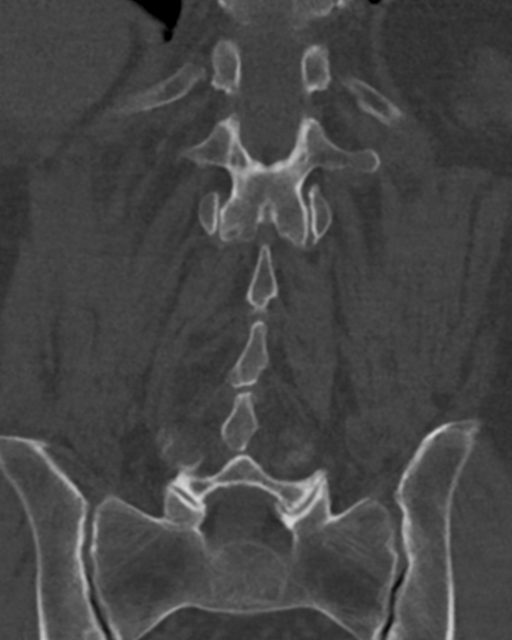
[im 52/87  bone]
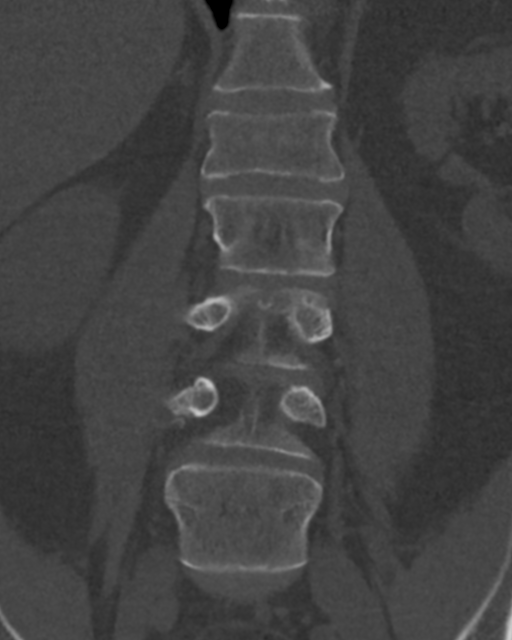

[Series 8: lumbar st · sagittal · 0.34mm/px · 5 of 93 slices shown, 6 images (3 of 3)]
[im 31/93  bone]
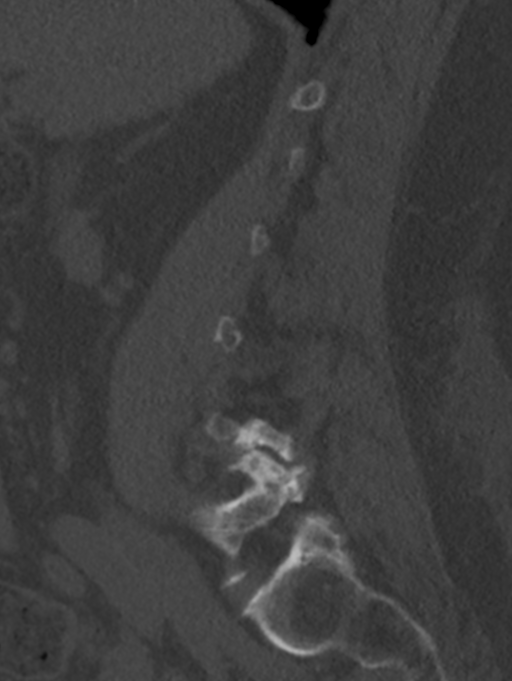
[im 39/93  bone]
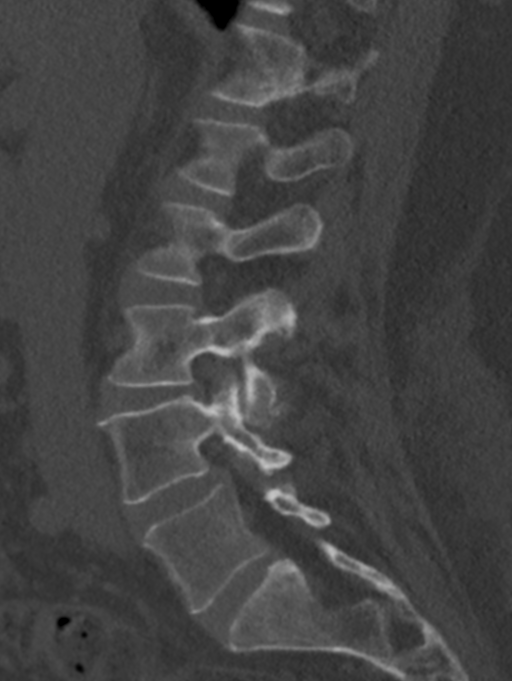
[im 47/93  soft-tissue]
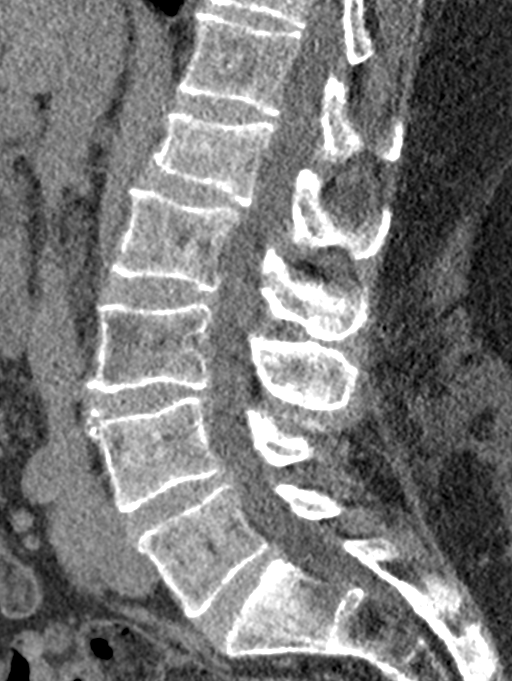
[im 47/93  bone]
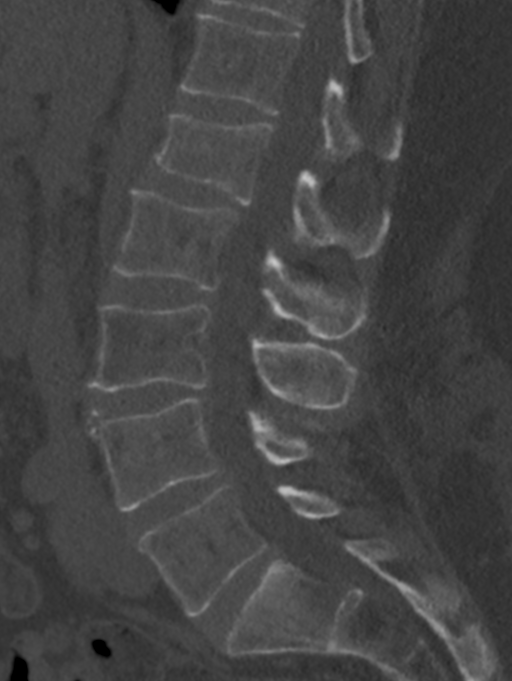
[im 54/93  bone]
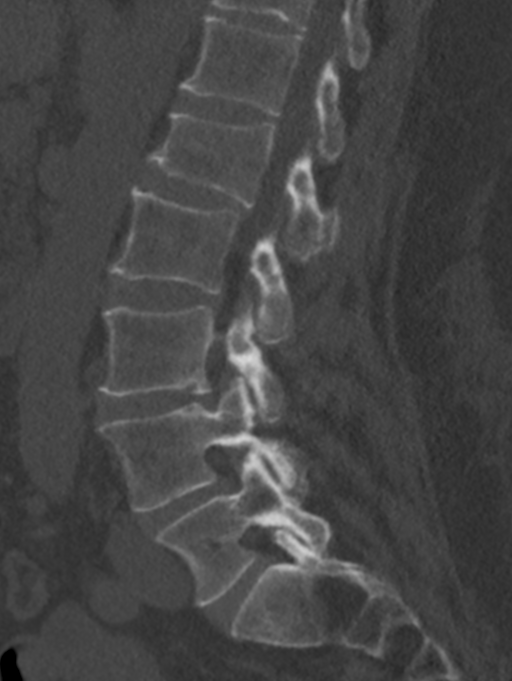
[im 62/93  bone]
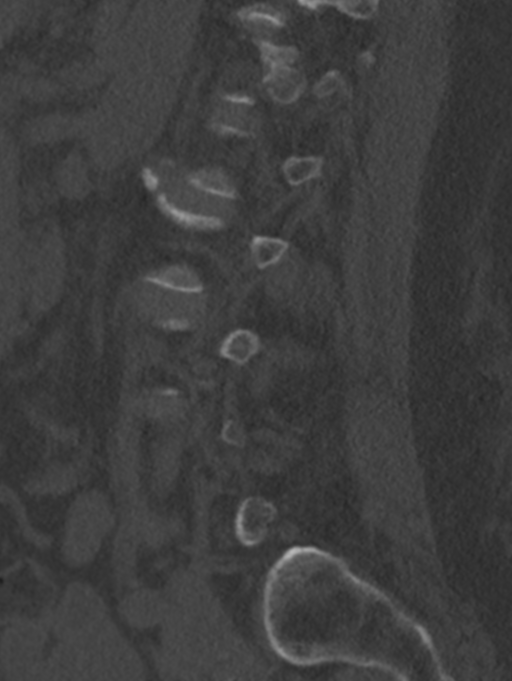

[13 of 33 positions shown; findings below may reference images not displayed]

FINDINGS: Lower chest: The lung bases are clear. The heart size is normal.

Hepatobiliary: The liver is normal. Normal gallbladder.There is no
biliary ductal dilation.

Pancreas: Normal contours without ductal dilatation. No
peripancreatic fluid collection.

Spleen: Unremarkable.

Adrenals/Urinary Tract:

--Adrenal glands: Unremarkable.

--Right kidney/ureter: No hydronephrosis or radiopaque kidney
stones.

--Left kidney/ureter: No hydronephrosis or radiopaque kidney stones.

--Urinary bladder: Unremarkable.

Stomach/Bowel:

--Stomach/Duodenum: The patient is status post prior gastric bypass.

--Small bowel: Unremarkable.

--Colon: Unremarkable.

--Appendix: Normal.

Vascular/Lymphatic: Atherosclerotic calcification is present within
the non-aneurysmal abdominal aorta, without hemodynamically
significant stenosis.

--No retroperitoneal lymphadenopathy.

--No mesenteric lymphadenopathy.

--No pelvic or inguinal lymphadenopathy.

Reproductive: Unremarkable

Other: No ascites or free air. The abdominal wall is normal.

Musculoskeletal. No acute displaced fractures. There is mild facet
arthrosis in the lower lumbar segments. There is no significant
malalignment. There is a posterior disc osteophyte complex at the
L4-L5 level resulting in mild neural foraminal narrowing on the
right. There is a posterior disc osteophyte complex at the L3-L4
level in association with ligamentum flavum hypertrophy. There is
minimal spinal canal narrowing.
IMPRESSION: 1. No CT evidence for acute intra-abdominal or intrapelvic
pathology.
2. Status post prior gastric bypass.
3. No acute lumbar spine fracture or subluxation.
4. Posterior disc osteophyte complexes at the L3-L4 and L4-L5 levels
resulting in minimal spinal canal narrowing and mild neural
foraminal narrowing on the right at the L4-L5 level.

Aortic Atherosclerosis ([id number]).

## 2022-06-22 ENCOUNTER — Telehealth: Payer: Self-pay | Admitting: Physician Assistant

## 2022-06-22 NOTE — Telephone Encounter (Signed)
Called patient back about his message. Patient stated he has a history of A. FIB. He stated his apple watch has been picking up irregular HR, that his HR is fluctuating between 70 to 100. Patient stated in the past Dr. [name] adjusted his metoprolol. Patient is currently taking metoprolol 12.5 mg BID and xarelto 20 mg daily. Made patient an appointment with NP to be evaluated and get an EKG. Patient has not been seen by another provider in our practice since 2013 by Dr. Antoine Poche, and he last saw Dr. [name] February 2023. Patient is suppose to follow-up with Francis Dowse PA per recall. Will send message to [facility] for further advisement.

## 2022-06-22 NOTE — Progress Notes (Deleted)
Office Visit    Patient Name: Tyrone [name] Date of Encounter: 06/22/2022  Primary Care Provider:  Shirlean Mylar, MD Primary Cardiologist:  Hillis Range, MD Primary Electrophysiologist: Hillis Range, MD  Chief Complaint    Tyrone [name] is a 56 y.o. male with PMH of persistent AF s/p DCCV 2013 and AF ablation 09/2017, CVA (2013), GERD, Guillain-Barr, HTN morbid obesity s/p gastric bypass (2014), NICM, combined systolic and diastolic CHF, OSA (on CPAP) who presents today for complaint of atrial fibrillation.  Past Medical History    Past Medical History:  Diagnosis Date   Anemia, iron deficiency 01/30/2017   Cervicalgia    CVA (cerebral infarction) 05/01/12   on pradaxa at time of stroke, switched to xarelto   External thrombosed hemorrhoids    GERD (gastroesophageal reflux disease)    Guillain Barr syndrome (HCC)    following seasonal influenza vaccination   Guillain-Barre syndrome (HCC)    from stroke in 2013   Hematuria    a. 01/2012 - to f/u with urology as outpt.   Hernia of unspecified site of abdominal cavity without mention of obstruction or gangrene    of abdominal   Hx of gastric bypass 01/30/2017   2004 Dr Clent Ridges [facility] weight 450 lbs   Hypertension    Iron malabsorption 01/30/2017   Hx gastric bypass 2004   Morbid obesity s/p gastric bypass    Nonischemic cardiomyopathy (HCC)    a. 01/2012 Echo: EF 35-40%, Diff HK, mild MR;  b. 01/2012 R&L Heart Cath: mildly elevated R heart pressures (pcwp 17) , nl cors.   Osteoarthrosis, unspecified whether generalized or localized, ankle and foot    of the knees,ankle and foot   Pancytopenia    Persistent atrial fibrillation (HCC)    a. Pradaxa started 01/2012;  b .s/p TEE/DCCV 02/16/2012; c. recurrent afib->Tikosyn Initiation 03/2012   Sleep apnea    CPAP compliant   Status post bypass gastrojejunostomy 01/30/2017   Stroke (HCC)    Unspecified intestinal malabsorption    Urticaria, unspecified    Vitamin B 12  deficiency    Past Surgical History:  Procedure Laterality Date   ATRIAL FIBRILLATION ABLATION N/A 06/25/2012   PVI by Dr [name]   ATRIAL FIBRILLATION ABLATION N/A 10/04/2017   Procedure: ATRIAL FIBRILLATION ABLATION;  Surgeon: Hillis Range, MD;  Location: [facility] INVASIVE CV LAB;  Service: Cardiovascular;  Laterality: N/A;   CARDIOVERSION  02/16/2012   Procedure: CARDIOVERSION;  Surgeon: [name], MD;  Location: [facility] ENDOSCOPY;  Service: Cardiovascular;  Laterality: N/A;   CARDIOVERSION  04/22/2012   Procedure: CARDIOVERSION;  Surgeon: [name], MD;  Location: [facility] ENDOSCOPY;  Service: Cardiovascular;  Laterality: N/A;   CARDIOVERSION N/A 08/22/2017   Procedure: CARDIOVERSION;  Surgeon: Chilton Si, MD;  Location: [facility] ENDOSCOPY;  Service: Cardiovascular;  Laterality: N/A;   CARDIOVERSION N/A 06/11/2018   Procedure: CARDIOVERSION;  Surgeon: Elease Hashimoto Deloris Ping, MD;  Location: [facility] ENDOSCOPY;  Service: Cardiovascular;  Laterality: N/A;   CARDIOVERSION N/A 04/29/2019   Procedure: CARDIOVERSION;  Surgeon: Sande Rives, MD;  Location: [facility] ENDOSCOPY;  Service: Endoscopy;  Laterality: N/A;   CARDIOVERSION N/A 06/18/2019   Procedure: CARDIOVERSION;  Surgeon: Chilton Si, MD;  Location: [facility] ENDOSCOPY;  Service: Cardiovascular;  Laterality: N/A;   COLONOSCOPY N/A 07/07/2016   Procedure: COLONOSCOPY;  Surgeon: Jeani Hawking, MD;  Location: [facility] ENDOSCOPY;  Service: Endoscopy;  Laterality: N/A;   ESOPHAGOGASTRODUODENOSCOPY N/A 07/07/2016   Procedure: ESOPHAGOGASTRODUODENOSCOPY (EGD);  Surgeon:  Carol Ada, MD;  Location: [facility] ENDOSCOPY;  Service: Endoscopy;  Laterality: N/A;   GASTRIC BYPASS  2004   HERNIA REPAIR     LEFT AND RIGHT HEART CATHETERIZATION WITH CORONARY ANGIOGRAM N/A 02/13/2012   Procedure: LEFT AND RIGHT HEART CATHETERIZATION WITH CORONARY ANGIOGRAM;  Surgeon: Sherren Mocha, MD;  Location: [facility] CATH LAB;  Service: Cardiovascular;  Laterality: N/A;   TEE WITHOUT CARDIOVERSION   02/16/2012   Procedure: TRANSESOPHAGEAL ECHOCARDIOGRAM (TEE);  Surgeon: Lelon Perla, MD;  Location: [facility] ENDOSCOPY;  Service: Cardiovascular;  Laterality: N/A;  10a CV   TEE WITHOUT CARDIOVERSION  06/24/2012   Procedure: TRANSESOPHAGEAL ECHOCARDIOGRAM (TEE);  Surgeon: Peter M Martinique, MD;  Location: [facility] ENDOSCOPY;  Service: Cardiovascular;  Laterality: N/A;   TEE WITHOUT CARDIOVERSION N/A 08/22/2017   Procedure: TRANSESOPHAGEAL ECHOCARDIOGRAM (TEE);  Surgeon: Skeet Latch, MD;  Location: [place];  Service: Cardiovascular;  Laterality: N/A;   TEE WITHOUT CARDIOVERSION N/A 06/11/2018   Procedure: TRANSESOPHAGEAL ECHOCARDIOGRAM (TEE);  Surgeon: Acie Fredrickson Wonda Cheng, MD;  Location: [facility] ENDOSCOPY;  Service: Cardiovascular;  Laterality: N/A;   UMBILICAL HERNIA REPAIR     x 2    Allergies  No Known Allergies  History of Present Illness    Tyrone [name]  is a 56 year old male with the above mention past medical history who presents today for complaint of atrial fibrillation.  Mr. Schoof was initially seen in 2013 for difficult to control hypertension.  ETT was completed 01/15/2012 that showed no chest pain but noted atrial arrhythmia with ventricular ectopy.  He was then seen for complaint of chest pain and shortness of breath 01/2012 and found to have new onset AF with RVR.  He was started on Cardizem drip and 2D echo was completed that showed EF of 35-40% with diffuse hypokinesis, mild dilated RV, small pericardial effusion.  CT was obtained due to hematuria that was negative for renal infarct and patient was scheduled for left and right heart cath.  Procedures completed and showed mild elevation in right heart pressures and normal coronaries.  Patient then underwent TEE with no evidence of LAA thrombus and was DCCV to sinus rhythm.  He was seen in follow-up by Dr. Percival Spanish and was noted to be back in atrial fibrillation.  He was hospitalized for Tikosyn initiation.  He developed lung QTCs and  hypokalemia and Tikosyn was stopped and he was started on amiodarone.  He was then cardioverted to sinus rhythm and was anticoagulated with pradaxa at that time. Unfortunately, he returned 05/01/12 with acute CVA. His PTT at that time was 34. He was switched from pradaxa to xarelto.  He was referred to Dr. Rayann Heman for consideration of AF ablation.  He underwent procedure 06/25/2012 and did well with no recurrence of A-fib.  He was taken off AAD therapy and continued to do well until going back into AF and undergoing DCCV 04/2019 and 05/2019.  He was started on low-dose flecainide and continued on with Xarelto.  He was last seen by Dr. Rayann Heman on 09/09/2021 for follow-up.  During visit patient denied any palpitations or chest pain.  During visit patient wished to continue flecainide and no medication changes were made.  Since last being seen in the office patient reports***.  Patient denies chest pain, palpitations, dyspnea, PND, orthopnea, nausea, vomiting, dizziness, syncope, edema, weight gain, or early satiety.  ***Notes:  Home Medications    Current Outpatient Medications  Medication Sig Dispense Refill   acetaminophen (TYLENOL) 500 MG tablet Take 1,000  mg by mouth every 6 (six) hours as needed (for pain).     amLODipine (NORVASC) 5 MG tablet Take 1 tablet by mouth once daily 90 tablet 1   ferrous sulfate 325 (65 FE) MG tablet Take 325 mg by mouth daily.      flecainide (TAMBOCOR) 50 MG tablet Take 1 tablet by mouth twice daily 180 tablet 1   folic acid (FOLVITE) 1 MG tablet Take 1 tablet by mouth daily.     furosemide (LASIX) 40 MG tablet Take 1/2 (one-half) tablet by mouth once daily 45 tablet 1   metoprolol tartrate (LOPRESSOR) 25 MG tablet Take 0.5 tablets (12.5 mg total) by mouth 2 (two) times daily. 90 tablet 3   Multiple Vitamin (MULTIVITAMIN) tablet Take 1-2 tablets by mouth See admin instructions. Take 2 tablets in the morning and 1 tablet in the evening     omeprazole (PRILOSEC) 40 MG  capsule Take 40 mg by mouth daily.   1   rivaroxaban (XARELTO) 20 MG TABS tablet TAKE 1 TABLET BY MOUTH ONCE DAILY WITH SUPPER 90 tablet 1   spironolactone (ALDACTONE) 25 MG tablet Take 1 tablet (25 mg total) by mouth daily. 90 tablet 3   vitamin E 180 MG (400 UNITS) capsule Take 400 Units by mouth daily.     No current facility-administered medications for this visit.   Facility-Administered Medications Ordered in Other Visits  Medication Dose Route Frequency Provider Last Rate Last Admin   gadopentetate dimeglumine (MAGNEVIST) injection 20 mL  20 mL Intravenous Once PRN Anson Fret, MD         Review of Systems  Please see the history of present illness.    (+)*** (+)***  All other systems reviewed and are otherwise negative except as noted above.  Physical Exam    Wt Readings from Last 3 Encounters:  09/09/21 188 lb 3.2 oz (85.4 kg)  02/28/21 195 lb 9.6 oz (88.7 kg)  05/08/20 213 lb (96.6 kg)   EN:IDPOE were no vitals filed for this visit.,There is no height or weight on file to calculate BMI.  Constitutional:      Appearance: Healthy appearance. Not in distress.  Neck:     Vascular: JVD normal.  Pulmonary:     Effort: Pulmonary effort is normal.     Breath sounds: No wheezing. No rales. Diminished in the bases Cardiovascular:     Normal rate. Regular rhythm. Normal S1. Normal S2.      Murmurs: There is no murmur.  Edema:    Peripheral edema absent.  Abdominal:     Palpations: Abdomen is soft non tender. There is no hepatomegaly.  Skin:    General: Skin is warm and dry.  Neurological:     General: No focal deficit present.     Mental Status: Alert and oriented to person, place and time.     Cranial Nerves: Cranial nerves are intact.  EKG/LABS/Other Studies Reviewed    ECG personally reviewed by me today - ***  Risk Assessment/Calculations:   {Does this patient have ATRIAL FIBRILLATION?[phone]}        Lab Results  Component Value Date   WBC 4.6  05/08/2020   HGB 13.0 05/08/2020   HCT 38.2 (L) 05/08/2020   MCV 102.4 (H) 05/08/2020   PLT 151 05/08/2020   Lab Results  Component Value Date   CREATININE 0.78 05/08/2020   BUN 13 05/08/2020   NA 137 05/08/2020   K 4.7 05/08/2020   CL 105 05/08/2020  CO2 24 05/08/2020   Lab Results  Component Value Date   ALT 25 05/08/2020   AST 30 05/08/2020   ALKPHOS 67 05/08/2020   BILITOT 0.7 05/08/2020   Lab Results  Component Value Date   CHOL 165 06/15/2015   HDL 85 06/15/2015   LDLCALC 71 06/15/2015   TRIG 46 06/15/2015   CHOLHDL 1.9 06/15/2015    Lab Results  Component Value Date   HGBA1C 5.6 05/01/2012    Assessment & Plan    1.  Persistent atrial fibrillation: -s/p AF ablation in 2013 with subsequent DCCV and currently on flecainide for rhythm control. -Continue flecainide 50 mg twice daily and metoprolol tartrate 12.5 mg twice daily -Patient denies any inappropriate bleeding  -Continue Xarelto 20 mg daily -Today patient reports***  2.  NICM/chronic combined CHF: -Most recent 2D echo completed 10/2019 with EF of 55-60%, no RWMA, mildly enlarged RV with moderately dilated LA -Today patient is*** -Continue GDMT with Aldactone 25 mg and Lasix 20 mg daily  3.  Essential hypertension: -Patient's blood pressure today was***  4.  History of CVA: -Patient currently on Xarelto   5.  History of gastric bypass: -Patient's BMI is***      Disposition: Follow-up with Thompson Grayer, MD or APP in *** months {Are you ordering a CV Procedure (e.g. stress test, cath, DCCV, TEE, etc)?   Press F2        :[id number]   Medication Adjustments/Labs and Tests Ordered: Current medicines are reviewed at length with the patient today.  Concerns regarding medicines are outlined above.   Signed, Mable Fill, Marissa Nestle, NP 06/22/2022, 9:57 AM [facility]  Note:  This document was prepared using Dragon voice recognition software and may include unintentional  dictation errors.

## 2022-06-22 NOTE — Telephone Encounter (Signed)
Patient c/o Palpitations:  High priority if patient c/o lightheadedness, shortness of breath, or chest pain  How long have you had palpitations/irregular HR/ Afib? Has been in a-fib since Monday Are you having the symptoms now? No   Are you currently experiencing lightheadedness, SOB or CP? No   Do you have a history of afib (atrial fibrillation) or irregular heart rhythm? Has history of A-Fib  Have you checked your BP or HR? (document readings if available): Wife called she didn't have a reading of his HR, she stated he didn't check his BP.   Are you experiencing any other symptoms? No

## 2022-06-23 ENCOUNTER — Ambulatory Visit: Payer: BC Managed Care – PPO | Admitting: Nurse Practitioner

## 2022-06-23 DIAGNOSIS — I4819 Other persistent atrial fibrillation: Secondary | ICD-10-CM

## 2022-07-02 ENCOUNTER — Other Ambulatory Visit: Payer: Self-pay | Admitting: Internal Medicine

## 2022-07-04 MED ORDER — SPIRONOLACTONE 25 MG PO TABS: 25.0000 mg | ORAL_TABLET | Freq: Every day | ORAL | 0 refills | Status: DC

## 2022-08-15 ENCOUNTER — Other Ambulatory Visit: Payer: Self-pay | Admitting: Internal Medicine

## 2022-09-27 ENCOUNTER — Other Ambulatory Visit: Payer: Self-pay

## 2022-09-27 MED ORDER — SPIRONOLACTONE 25 MG PO TABS: 25.0000 mg | ORAL_TABLET | Freq: Every day | ORAL | 0 refills | Status: DC

## 2022-09-27 MED ORDER — AMLODIPINE BESYLATE 5 MG PO TABS: 5.0000 mg | ORAL_TABLET | Freq: Every day | ORAL | 0 refills | Status: DC

## 2022-09-27 NOTE — Addendum Note (Signed)
Addended by: Carter Kitten D on: 09/27/2022 09:11 AM   Modules accepted: Orders

## 2022-10-05 ENCOUNTER — Other Ambulatory Visit: Payer: Self-pay | Admitting: Physician Assistant

## 2022-10-05 DIAGNOSIS — I4819 Other persistent atrial fibrillation: Secondary | ICD-10-CM

## 2022-10-05 MED ORDER — FUROSEMIDE 40 MG PO TABS: ORAL_TABLET | ORAL | 0 refills | Status: DC

## 2022-10-05 MED ORDER — RIVAROXABAN 20 MG PO TABS: 20.0000 mg | ORAL_TABLET | Freq: Every day | ORAL | 1 refills | Status: DC

## 2022-10-05 NOTE — Telephone Encounter (Signed)
*  STAT* If patient is at the pharmacy, call can be transferred to refill team.   1. Which medications need to be refilled? (please list name of each medication and dose if known) furosemide (LASIX) 40 MG tablet ; rivaroxaban (XARELTO) 20 MG TABS tablet   2. Which pharmacy/location (including street and city if local pharmacy) is medication to be sent to? [place], [place] [address]   3. Do they need a 30 day or 90 day supply? [place]

## 2022-10-05 NOTE — Telephone Encounter (Signed)
Prescription refill request for Xarelto received.  Indication: Afiib  Last office visit: 10/12/21 Tyrone [name])  Weight: 85.4kg Age: 56 Scr: 0.77 (05/24/22 via  [facility]) CrCl: 127.69m/min  Appropriate dose. Refill sent.

## 2022-10-26 ENCOUNTER — Other Ambulatory Visit: Payer: Self-pay | Admitting: General Practice

## 2022-11-01 ENCOUNTER — Other Ambulatory Visit: Payer: Self-pay | Admitting: General Practice

## 2022-11-01 ENCOUNTER — Other Ambulatory Visit: Payer: Self-pay | Admitting: Physician Assistant

## 2022-11-15 ENCOUNTER — Telehealth: Payer: Self-pay

## 2022-11-15 ENCOUNTER — Other Ambulatory Visit: Payer: Self-pay

## 2022-11-15 MED ORDER — FUROSEMIDE 40 MG PO TABS: ORAL_TABLET | ORAL | 0 refills | Status: DC

## 2022-11-15 NOTE — Telephone Encounter (Signed)
Patient came to office requesting refill furosemide - has scheduled appointment on 5/21 to see R. Keitha [place], [place] - sent in 1 month worth only to get through til next visit. No further needs at this time

## 2022-11-29 ENCOUNTER — Other Ambulatory Visit: Payer: Self-pay

## 2022-11-29 MED ORDER — SPIRONOLACTONE 25 MG PO TABS: 25.0000 mg | ORAL_TABLET | Freq: Every day | ORAL | 0 refills | Status: DC

## 2022-12-01 ENCOUNTER — Other Ambulatory Visit: Payer: Self-pay | Admitting: *Deleted

## 2022-12-01 MED ORDER — METOPROLOL TARTRATE 25 MG PO TABS: 12.5000 mg | ORAL_TABLET | Freq: Two times a day (BID) | ORAL | 0 refills | Status: DC

## 2022-12-10 NOTE — Progress Notes (Unsigned)
Cardiology Office Note Date:  12/12/2022  Patient ID:  Tyrone, [name] June 24, 1966, MRN [id number] PCP:  Shirlean Mylar, MD  Electrophysiologist: Dr. [name]    Chief Complaint: annual visit  History of Present Illness: Tyrone [name] is a 57 y.o. male with history of stroke (on Pradaxa >> Xarelto), Guillain Barr syndrome, bariatric surgery (2004), HTN, NICM, sleep apnea w/CPAP, Afib  He saw Dr. [name] 09/09/21, doing well, SB on his EKG, metoprolol dose reduced.  Pt called Nov 2023 with AFib controlled rates by his report, had an appt to see E. Louanne Skye, NP Dec, though cancelled that appt.  TODAY He continues to do well Denies any difficulties with ADLs No symptoms of bradycardia No CP, SOB, DOE No AFib since his episode above, and rates were controlled, current burden very acceptable, he had no symptoms, his watch alerted him to it No near syncope or syncope No bleeding or signs of bleeding  Weight remains stable Max weight pre-bariatric surgery (2004) was 425lbs >> today 217   AFib/AAD hx PVI ablation 06/25/2012 Tikosyn failed during load 2/2 QT prolongation 2013 Amiodarone Sept 2013 > stopped March 2013 Re-do PVI ablation 10/04/2017 Flecainide started Nov 2020  Past Medical History:  Diagnosis Date   Anemia, iron deficiency 01/30/2017   Cervicalgia    CVA (cerebral infarction) 05/01/12   on pradaxa at time of stroke, switched to xarelto   External thrombosed hemorrhoids    GERD (gastroesophageal reflux disease)    Guillain Barr syndrome (HCC)    following seasonal influenza vaccination   Guillain-Barre syndrome (HCC)    from stroke in 2013   Hematuria    a. 01/2012 - to f/u with urology as outpt.   Hernia of unspecified site of abdominal cavity without mention of obstruction or gangrene    of abdominal   Hx of gastric bypass 01/30/2017   2004 Dr Clent Ridges [facility] weight 450 lbs   Hypertension    Iron malabsorption 01/30/2017   Hx gastric bypass 2004    Morbid obesity s/p gastric bypass    Nonischemic cardiomyopathy (HCC)    a. 01/2012 Echo: EF 35-40%, Diff HK, mild MR;  b. 01/2012 R&L Heart Cath: mildly elevated R heart pressures (pcwp 17) , nl cors.   Osteoarthrosis, unspecified whether generalized or localized, ankle and foot    of the knees,ankle and foot   Pancytopenia    Persistent atrial fibrillation (HCC)    a. Pradaxa started 01/2012;  b .s/p TEE/DCCV 02/16/2012; c. recurrent afib->Tikosyn Initiation 03/2012   Sleep apnea    CPAP compliant   Status post bypass gastrojejunostomy 01/30/2017   Stroke (HCC)    Unspecified intestinal malabsorption    Urticaria, unspecified    Vitamin B 12 deficiency     Past Surgical History:  Procedure Laterality Date   ATRIAL FIBRILLATION ABLATION N/A 06/25/2012   PVI by Dr [name]   ATRIAL FIBRILLATION ABLATION N/A 10/04/2017   Procedure: ATRIAL FIBRILLATION ABLATION;  Surgeon: Hillis Range, MD;  [facility] INV[facility] CV LAB;  Service: Cardiovascular;  Laterality: N/A;   CARDIOVERSION  02/16/2012   Procedure: CARDIOVERSION;  Surgeon: [name], MD;  Location: [facility] ENDOSCOPY;  Service: Cardiovascular;  Laterality: N/A;   CARDIOVERSION  04/22/2012   Procedure: CARDIOVERSION;  Surgeon: [name], MD;  Location: [name] ENDOSCOPY;  Service: Cardiovascular;  Laterality: N/A;   CARDIOVERSION N/A 08/22/2017   Procedure: CARDIOVERSION;  Surgeon: Chilton Si, MD;  Location: [facility] ENDOSCOPY;  Service:  Cardiovascular;  Laterality: N/A;   CARDIOVERSION N/A 06/11/2018   Procedure: CARDIOVERSION;  Surgeon: Elease Hashimoto Deloris Ping, MD;  Location: [facility] ENDOSCOPY;  Service: Cardiovascular;  Laterality: N/A;   CARDIOVERSION N/A 04/29/2019   Procedure: CARDIOVERSION;  Surgeon: Sande Rives, MD;  Location: [facility] ENDOSCOPY;  Service: Endoscopy;  Laterality: N/A;   CARDIOVERSION N/A 06/18/2019   Procedure: CARDIOVERSION;  Surgeon: Chilton Si, MD;  Location: [facility] ENDOSCOPY;  Service: Cardiovascular;  Laterality:  N/A;   COLONOSCOPY N/A 07/07/2016   Procedure: COLONOSCOPY;  Surgeon: Jeani Hawking, MD;  Location: [facility] ENDOSCOPY;  Service: Endoscopy;  Laterality: N/A;   ESOPHAGOGASTRODUODENOSCOPY N/A 07/07/2016   Procedure: ESOPHAGOGASTRODUODENOSCOPY (EGD);  Surgeon: Jeani Hawking, MD;  Location: [facility] ENDOSCOPY;  Service: Endoscopy;  Laterality: N/A;   GASTRIC BYPASS  2004   HERNIA REPAIR     LEFT AND RIGHT HEART CATHETERIZATION WITH CORONARY ANGIOGRAM N/A 02/13/2012   Procedure: LEFT AND RIGHT HEART CATHETERIZATION WITH CORONARY ANGIOGRAM;  Surgeon: Tonny Bollman, MD;  Location: [facility] CATH LAB;  Service: Cardiovascular;  Laterality: N/A;   TEE WITHOUT CARDIOVERSION  02/16/2012   Procedure: TRANSESOPHAGEAL ECHOCARDIOGRAM (TEE);  Surgeon: [name], MD;  Location: [facility] ENDOSCOPY;  Service: Cardiovascular;  Laterality: N/A;  10a CV   TEE WITHOUT CARDIOVERSION  06/24/2012   Procedure: TRANSESOPHAGEAL ECHOCARDIOGRAM (TEE);  Surgeon: [name], MD;  Location: [facility] ENDOSCOPY;  Service: Cardiovascular;  Laterality: N/A;   TEE WITHOUT CARDIOVERSION N/A 08/22/2017   Procedure: TRANSESOPHAGEAL ECHOCARDIOGRAM (TEE);  Surgeon: Chilton Si, MD;  Location: [facility] ENDOSCOPY;  Service: Cardiovascular;  Laterality: N/A;   TEE WITHOUT CARDIOVERSION N/A 06/11/2018   Procedure: TRANSESOPHAGEAL ECHOCARDIOGRAM (TEE);  Surgeon: Vesta Mixer, MD;  Location: [facility] ENDOSCOPY;  Service: Cardiovascular;  Laterality: N/A;   UMBILICAL HERNIA REPAIR     x 2    Current Outpatient Medications  Medication Sig Dispense Refill   amLODipine (NORVASC) 5 MG tablet Take 1 tablet by mouth once daily 30 tablet 2   ferrous sulfate 325 (65 FE) MG tablet Take 325 mg by mouth daily.      flecainide (TAMBOCOR) 50 MG tablet Take 1 tablet by mouth twice daily 180 tablet 0   folic acid (FOLVITE) 1 MG tablet Take 1 tablet by mouth daily.     furosemide (LASIX) 40 MG tablet Take 1/2 (one-half) tablet by mouth once daily - Must keep upcoming appointment  on 12/12/22 for further refills 15 tablet 0   metoprolol tartrate (LOPRESSOR) 25 MG tablet Take 0.5 tablets (12.5 mg total) by mouth 2 (two) times daily. 60 tablet 0   Multiple Vitamin (MULTIVITAMIN) tablet Take 1-2 tablets by mouth See admin instructions. Take 2 tablets in the morning and 1 tablet in the evening     omeprazole (PRILOSEC) 40 MG capsule Take 40 mg by mouth daily.   1   rivaroxaban (XARELTO) 20 MG TABS tablet Take 1 tablet (20 mg total) by mouth daily with supper. 90 tablet 1   spironolactone (ALDACTONE) 25 MG tablet Take 1 tablet (25 mg total) by mouth daily. 30 tablet 0   vitamin E 180 MG (400 UNITS) capsule Take 400 Units by mouth daily.     acetaminophen (TYLENOL) 500 MG tablet Take 1,000 mg by mouth every 6 (six) hours as needed (for pain).     No current facility-administered medications for this visit.   Facility-Administered Medications Ordered in Other Visits  Medication Dose Route Frequency Provider Last Rate Last Admin   gadopentetate dimeglumine (MAGNEVIST) injection 20 mL  20  mL Intravenous Once PRN Anson Fret, MD        Allergies:   Patient has no known allergies.   Social History:  The patient  reports that he has never smoked. He has been exposed to tobacco smoke. He quit smokeless tobacco use about 20 years ago.  His smokeless tobacco use included chew. He reports current alcohol use of about 2.0 standard drinks of alcohol per week. He reports that he does not use drugs.   Family History:  The patient's family history includes Diabetes in his father; Heart attack (age of onset: 42) in his father; Hypertension in his father and mother; Kidney disease in his father; Stroke in his paternal grandmother.  ROS:  Please see the history of present illness.    All other systems are reviewed and otherwise negative.   PHYSICAL EXAM:  VS:  BP 120/80   Pulse (!) 55   Ht 5\' 6"  (1.676 m)   Wt 217 lb (98.4 kg)   SpO2 97%   BMI 35.02 kg/m  BMI: Body mass index is  35.02 kg/m. Well nourished, well developed, in no acute distress HEENT: normocephalic, atraumatic Neck: no JVD, carotid bruits or masses Cardiac:  RRR; no significant murmurs, no rubs, or gallops Lungs:  CTA b/l, no wheezing, rhonchi or rales Abd: soft, nontender MS: no deformity or atrophy Ext: no edema Skin: warm and dry, no rash Neuro:  No gross deficits appreciated Psych: euthymic mood, full affect   EKG:  Done today and reviewed by myself shows  SB 55bpm, PR manually measure , QRS , QTc 449, icRBBB, stable intervals in review of prior EKGs  11/03/19: TTE 1. Left ventricular ejection fraction, by estimation, is 55 to 60%. The  left ventricle has normal function. The left ventricle has no regional  wall motion abnormalities. Left ventricular diastolic parameters were  normal.   2. Right ventricular systolic function is normal. The right ventricular  size is mildly enlarged. Tricuspid regurgitation signal is inadequate for  assessing PA pressure.   3. Left atrial size was moderately dilated.   4. The mitral valve is grossly normal, mildly thickened and with annular  calcification. Trivial mitral valve regurgitation.   5. The aortic valve is tricuspid, mildly sclerotic. Aortic valve  regurgitation is not visualized.   6. The inferior vena cava is normal in size with greater than 50%  respiratory variability, suggesting right atrial pressure of 3 mmHg.    10/04/2017: EPS/ablation CONCLUSIONS: 1. Atrial fibrillation upon presentation.   2. Intracardiac echo reveals a moderate sized left atrium with four separate pulmonary veins without evidence of pulmonary vein stenosis. 3. Return of electrical conduction within the right superior pulmonary vein, along the anterior portion.  There was also conduction along the carina between the RSPV and RIPV.  There was trivial conduction within the left superior pulmonary vein.  As the patient's prior ablation was an antral procedure,  I elected to perform WACA of all four PVs. 4. Successful electrical re- isolation and anatomical encircling of all four pulmonary veins with radiofrequency current.  A WACA approach was used  5. Additional left atrial ablation was performed with a standard box lesion created along the posterior wall of the left atrium 6. Atrial fibrillation successfully cardioverted to sinus rhythm. 7. No early apparent complications.   06/25/2012: EPS/ablation CONCLUSIONS: 1. Sinus rhythm upon presentation.   2. Rotational Angiography reveals a moderate sized left atrium with four separate pulmonary veins without evidence of pulmonary vein stenosis.  3. Successful electrical isolation and anatomical encircling of all four pulmonary veins with radiofrequency current.  SVC isolation was also performed. 4. No inducible arrhythmias following ablation both on and off of Isuprel 5. No early apparent complications.  Recent Labs: No results found for requested labs within last 365 days.  No results found for requested labs within last 365 days.   CrCl cannot be calculated (Patient's most recent lab result is older than the maximum 21 days allowed.).   Wt Readings from Last 3 Encounters:  12/12/22 217 lb (98.4 kg)  09/09/21 188 lb 3.2 oz (85.4 kg)  02/28/21 195 lb 9.6 oz (88.7 kg)     Other studies reviewed: Additional studies/records reviewed today include: summarized above  ASSESSMENT AND PLAN:  Persistent AFib Atypical AFlutter CHA2DS2Vasc is 4, on Xarelto, appropriately dosed Flecainide w/metoprolol, w/stable intervals low burden by symptoms/watch  Labs done via his PMD, last 09/30/21, due for a visit BUN/Creat 14/0.85 H/H 10.5/31.7, lower then historical labs, he denies any bleeding or signs of bleeding He will get labs done via his PMD   NICM Tachy-mediated Recovered LVEF by echo 2021 No symptoms or exam findings of volume OL  HTN Looks ok   Discussed need for new EP MD, will  transition to Dr. Nelly Laurence   Disposition: F/u with [place] in 26mo, sooner if needed  Current medicines are reviewed at length with the patient today.  The patient did not have any concerns regarding medicines.  Norma Fredrickson, PA-C 12/12/2022 8:43 AM     [facility] [address] [place] [address] [phone] (office)  [phone] (fax)

## 2022-12-12 ENCOUNTER — Ambulatory Visit: Payer: BC Managed Care – PPO | Attending: Physician Assistant | Admitting: Physician Assistant

## 2022-12-12 ENCOUNTER — Encounter: Payer: Self-pay | Admitting: Physician Assistant

## 2022-12-12 VITALS — BP 120/80 | HR 55 | Ht 66.0 in | Wt 217.0 lb

## 2022-12-12 DIAGNOSIS — I428 Other cardiomyopathies: Secondary | ICD-10-CM

## 2022-12-12 DIAGNOSIS — Z79899 Other long term (current) drug therapy: Secondary | ICD-10-CM

## 2022-12-12 DIAGNOSIS — I484 Atypical atrial flutter: Secondary | ICD-10-CM | POA: Diagnosis not present

## 2022-12-12 DIAGNOSIS — I4819 Other persistent atrial fibrillation: Secondary | ICD-10-CM

## 2022-12-12 DIAGNOSIS — Z5181 Encounter for therapeutic drug level monitoring: Secondary | ICD-10-CM | POA: Diagnosis not present

## 2022-12-12 MED ORDER — FUROSEMIDE 40 MG PO TABS: ORAL_TABLET | ORAL | 3 refills | Status: DC

## 2022-12-12 NOTE — Patient Instructions (Signed)
Medication Instructions:   Your physician recommends that you continue on your current medications as directed. Please refer to the Current Medication list given to you today.  *If you need a refill on your cardiac medications before your next appointment, please call your pharmacy*   Lab Work: NONE ORDERED  TODAY   If you have labs (blood work) drawn today and your tests are completely normal, you will receive your results only by: MyChart Message (if you have MyChart) OR A paper copy in the mail If you have any lab test that is abnormal or we need to change your treatment, we will call you to review the results.   Testing/Procedures: NONE ORDERED  TODAY     Follow-Up: At [facility], you and your health needs are our priority.  As part of our continuing mission to provide you with exceptional heart care, we have created designated Provider Care Teams.  These Care Teams include your primary Cardiologist (physician) and Advanced Practice Providers (APPs -  Physician Assistants and Nurse Practitioners) who all work together to provide you with the care you need, when you need it.  We recommend signing up for the patient portal called "MyChart".  Sign up information is provided on this After Visit Summary.  MyChart is used to connect with patients for Virtual Visits (Telemedicine).  Patients are able to view lab/test results, encounter notes, upcoming appointments, etc.  Non-urgent messages can be sent to your provider as well.   To learn more about what you can do with MyChart, go to [email or web address].    Your next appointment:    6 month(s)  Provider:   York Pellant, MD    Other Instructions

## 2023-01-01 ENCOUNTER — Other Ambulatory Visit: Payer: Self-pay

## 2023-01-01 ENCOUNTER — Other Ambulatory Visit: Payer: Self-pay | Admitting: *Deleted

## 2023-01-01 MED ORDER — SPIRONOLACTONE 25 MG PO TABS: 25.0000 mg | ORAL_TABLET | Freq: Every day | ORAL | 3 refills | Status: DC

## 2023-01-01 MED ORDER — METOPROLOL TARTRATE 25 MG PO TABS: 12.5000 mg | ORAL_TABLET | Freq: Two times a day (BID) | ORAL | 3 refills | Status: DC

## 2023-01-01 MED ORDER — AMLODIPINE BESYLATE 5 MG PO TABS: 5.0000 mg | ORAL_TABLET | Freq: Every day | ORAL | 3 refills | Status: DC

## 2023-01-21 ENCOUNTER — Other Ambulatory Visit: Payer: Self-pay | Admitting: General Practice

## 2023-02-02 ENCOUNTER — Telehealth: Payer: Self-pay | Admitting: Cardiovascular Disease

## 2023-02-02 NOTE — Telephone Encounter (Signed)
Patient reports being in AFIB for about one week. He has an Scientist, physiological that alerted him. He has not symptoms and cannot tell that he is in AFIB. He is taking xarelto, flecainide, and metoprolol as prescribed. Reports that heart rate has been in the 80s. His usual resting rate is in the 40-50s. He states that usually he only goes into AFIB for about 3-4 days and then goes back into normal rhythm. Offered to get patient an appointment with Afib clinic. He declines at this time. Will make Dr. Nelly Laurence aware.

## 2023-02-02 NOTE — Telephone Encounter (Signed)
Patient is asking that the nurse gives him a call back. Please advise 

## 2023-03-23 ENCOUNTER — Other Ambulatory Visit: Payer: Self-pay | Admitting: General Practice

## 2023-03-23 DIAGNOSIS — I4819 Other persistent atrial fibrillation: Secondary | ICD-10-CM

## 2023-03-23 NOTE — Telephone Encounter (Signed)
Prescription refill request for Xarelto received.  Indication:a fib Last office visit:5/24 Weight:98.4  kg Age:57 Scr:0.7  11/23 CrCl:145.54  ml/min  Prescription refilled

## 2023-04-09 ENCOUNTER — Encounter: Payer: Self-pay | Admitting: Podiatry

## 2023-04-09 ENCOUNTER — Ambulatory Visit: Payer: BC Managed Care – PPO | Admitting: Podiatry

## 2023-04-09 DIAGNOSIS — R197 Diarrhea, unspecified: Secondary | ICD-10-CM | POA: Insufficient documentation

## 2023-04-09 DIAGNOSIS — Z1211 Encounter for screening for malignant neoplasm of colon: Secondary | ICD-10-CM | POA: Insufficient documentation

## 2023-04-09 DIAGNOSIS — B351 Tinea unguium: Secondary | ICD-10-CM

## 2023-04-09 DIAGNOSIS — R634 Abnormal weight loss: Secondary | ICD-10-CM | POA: Insufficient documentation

## 2023-04-09 DIAGNOSIS — R194 Change in bowel habit: Secondary | ICD-10-CM | POA: Insufficient documentation

## 2023-04-09 DIAGNOSIS — K219 Gastro-esophageal reflux disease without esophagitis: Secondary | ICD-10-CM | POA: Insufficient documentation

## 2023-04-09 DIAGNOSIS — R1013 Epigastric pain: Secondary | ICD-10-CM | POA: Insufficient documentation

## 2023-04-09 NOTE — Progress Notes (Signed)
Chief Complaint  Patient presents with   Nail Problem    Hallux bilateral - toenails thick and right is discolored x 6 months, not painful, no treatment, noticing they are worsening and PCP advised visit with [place]   New Patient (Initial Visit)    HPI: 57 y.o. male presenting today as a new patient for evaluation of thickening with discoloration to the bilateral great toenails.  He says that the toenail do not really bother him but he was referred by his PCP.  Presenting for further treatment evaluation  Past Medical History:  Diagnosis Date   Anemia, iron deficiency 01/30/2017   Cervicalgia    CVA (cerebral infarction) 05/01/12   on pradaxa at time of stroke, switched to xarelto   External thrombosed hemorrhoids    GERD (gastroesophageal reflux disease)    Guillain Barr syndrome (HCC)    following seasonal influenza vaccination   Guillain-Barre syndrome (HCC)    from stroke in 2013   Hematuria    a. 01/2012 - to f/u with urology as outpt.   Hernia of unspecified site of abdominal cavity without mention of obstruction or gangrene    of abdominal   Hx of gastric bypass 01/30/2017   2004 Dr Clent Ridges [facility] weight 450 lbs   Hypertension    Iron malabsorption 01/30/2017   Hx gastric bypass 2004   Morbid obesity s/p gastric bypass    Nonischemic cardiomyopathy (HCC)    a. 01/2012 Echo: EF 35-40%, Diff HK, mild MR;  b. 01/2012 R&L Heart Cath: mildly elevated R heart pressures (pcwp 17) , nl cors.   Osteoarthrosis, unspecified whether generalized or localized, ankle and foot    of the knees,ankle and foot   Pancytopenia    Persistent atrial fibrillation (HCC)    a. Pradaxa started 01/2012;  b .s/p TEE/DCCV 02/16/2012; c. recurrent afib->Tikosyn Initiation 03/2012   Sleep apnea    CPAP compliant   Status post bypass gastrojejunostomy 01/30/2017   Stroke (HCC)    Unspecified intestinal malabsorption    Urticaria, unspecified    Vitamin B 12 deficiency     Past Surgical History:   Procedure Laterality Date   ATRIAL FIBRILLATION ABLATION N/A 06/25/2012   PVI by Dr [name]   ATRIAL FIBRILLATION ABLATION N/A 10/04/2017   Procedure: ATRIAL FIBRILLATION ABLATION;  Surgeon: Hillis Range, MD;  Location: [facility] INVASIVE CV LAB;  Service: Cardiovascular;  Laterality: N/A;   CARDIOVERSION  02/16/2012   Procedure: CARDIOVERSION;  Surgeon: [name], MD;  Location: [facility] ENDOSCOPY;  Service: Cardiovascular;  Laterality: N/A;   CARDIOVERSION  04/22/2012   Procedure: CARDIOVERSION;  Surgeon: [name], MD;  Location: [facility] ENDOSCOPY;  Service: Cardiovascular;  Laterality: N/A;   CARDIOVERSION N/A 08/22/2017   Procedure: CARDIOVERSION;  Surgeon: Chilton Si, MD;  Location: [facility] ENDOSCOPY;  Service: Cardiovascular;  Laterality: N/A;   CARDIOVERSION N/A 06/11/2018   Procedure: CARDIOVERSION;  Surgeon: Elease Hashimoto Deloris Ping, MD;  Location: [facility] ENDOSCOPY;  Service: Cardiovascular;  Laterality: N/A;   CARDIOVERSION N/A 04/29/2019   Procedure: CARDIOVERSION;  Surgeon: Sande Rives, MD;  Location: [facility] ENDOSCOPY;  Service: Endoscopy;  Laterality: N/A;   CARDIOVERSION N/A 06/18/2019   Procedure: CARDIOVERSION;  Surgeon: Chilton Si, MD;  Location: [facility] ENDOSCOPY;  Service: Cardiovascular;  Laterality: N/A;   COLONOSCOPY N/A 07/07/2016   Procedure: COLONOSCOPY;  Surgeon: Jeani Hawking, MD;  Location: [facility] ENDOSCOPY;  Service: Endoscopy;  Laterality: N/A;   ESOPHAGOGASTRODUODENOSCOPY N/A 07/07/2016   Procedure: ESOPHAGOGASTRODUODENOSCOPY (EGD);  Surgeon: Jeani Hawking,  MD;  Location: [facility] ENDOSCOPY;  Service: Endoscopy;  Laterality: N/A;   GASTRIC BYPASS  2004   HERNIA REPAIR     LEFT AND RIGHT HEART CATHETERIZATION WITH CORONARY ANGIOGRAM N/A 02/13/2012   Procedure: LEFT AND RIGHT HEART CATHETERIZATION WITH CORONARY ANGIOGRAM;  Surgeon: Tonny Bollman, MD;  Location: [facility] CATH LAB;  Service: Cardiovascular;  Laterality: N/A;   TEE WITHOUT CARDIOVERSION  02/16/2012   Procedure: TRANSESOPHAGEAL  ECHOCARDIOGRAM (TEE);  Surgeon: [name], MD;  Location: [facility] ENDOSCOPY;  Service: Cardiovascular;  Laterality: N/A;  10a CV   TEE WITHOUT CARDIOVERSION  06/24/2012   Procedure: TRANSESOPHAGEAL ECHOCARDIOGRAM (TEE);  Surgeon: [name], MD;  Location: [facility] ENDOSCOPY;  Service: Cardiovascular;  Laterality: N/A;   TEE WITHOUT CARDIOVERSION N/A 08/22/2017   Procedure: TRANSESOPHAGEAL ECHOCARDIOGRAM (TEE);  Surgeon: Chilton Si, MD;  Location: [facility] ENDOSCOPY;  Service: Cardiovascular;  Laterality: N/A;   TEE WITHOUT CARDIOVERSION N/A 06/11/2018   Procedure: TRANSESOPHAGEAL ECHOCARDIOGRAM (TEE);  Surgeon: Elease Hashimoto Deloris Ping, MD;  Location: [facility] ENDOSCOPY;  Service: Cardiovascular;  Laterality: N/A;   UMBILICAL HERNIA REPAIR     x 2    No Known Allergies   Physical Exam: General: The patient is alert and oriented x3 in no acute distress.  Dermatology: Skin is warm, dry and supple bilateral lower extremities.  Hyperkeratotic dystrophic nails noted especially to the bilateral great toes  Vascular: Palpable pedal pulses bilaterally. Capillary refill within normal limits.  No appreciable edema.  No erythema.  Neurological: Grossly intact via light touch  Musculoskeletal Exam: No pedal deformities noted  Assessment/Plan of Care: 1.  Hyperkeratotic dystrophic nails bilateral great toes  -Patient evaluated.  Patient states that the thickening and discoloration of the toenails do not really bother him that much.  His spouse who is present today states that occasionally they can be somewhat embarrassing when he is at the beach or at a pool where he is wearing sandals and his toes are exposed -Mechanical debridement of the nails bilateral was performed today as a courtesy for the patient.  Smoothed with a rotary bur -Recommend topical antifungal Tolcylen dispensed at checkout -Return to clinic as needed       Felecia Shelling, DPM [facility]  Dr. Felecia Shelling, DPM     [address] [place], [place] [address]                Office [phone]  Fax [phone]

## 2023-04-16 ENCOUNTER — Ambulatory Visit: Payer: BC Managed Care – PPO | Admitting: Podiatry

## 2023-06-01 ENCOUNTER — Ambulatory Visit: Payer: BC Managed Care – PPO | Attending: Cardiovascular Disease | Admitting: Cardiovascular Disease

## 2023-06-01 ENCOUNTER — Encounter: Payer: Self-pay | Admitting: Cardiovascular Disease

## 2023-06-01 VITALS — BP 124/72 | HR 54 | Ht 64.0 in | Wt 217.2 lb

## 2023-06-01 DIAGNOSIS — Z79899 Other long term (current) drug therapy: Secondary | ICD-10-CM | POA: Diagnosis not present

## 2023-06-01 DIAGNOSIS — I4819 Other persistent atrial fibrillation: Secondary | ICD-10-CM

## 2023-06-01 NOTE — Progress Notes (Signed)
  Electrophysiology Office Note:    Date:  06/01/2023   ID:  Tyrone [name], DOB 1965-12-10, MRN [id number]  PCP:  Shirlean Mylar, MD   [facility] Providers Cardiologist:  Hillis Range, MD (Inactive) Electrophysiologist:  Maurice Small, MD     Referring MD: Shirlean Mylar, MD   History of Present Illness:    Tyrone [name] is a 57 y.o. male with a medical history significant for atrial fibrillation, stroke, obstructive sleep apnea on CPAP, Guillain-Barr syndrome, bariatric surgery in 2004, who presents for follow-up.     He is a former patient of Dr. [name] and underwent ablations in about 2015, and a second ablation in 2019.  He was last seen in the electrophysiology clinic by Janean Sark on Dec 12, 2022.  At that time he was monitoring atrial fibrillation with his watch and was having very infrequent episodes.  He does not sense his atrial fibrillation, but receives notifications from his watch.  When initially detected, his ventricular rates in A-fib were often approaching 150 bpm.      Today, he reports that he is doing very well.  His last episode of atrial fibrillation was about 4 months ago and lasted only 2 or 3 days.  His medics detected heart rate was about 105 bpm.  He is monitoring carefully with his Apple watch.  EKGs/Labs/Other Studies Reviewed Today:     Echocardiogram:  TTE 10/2019 EF 55-60%. Moderately dilated LA     EKG:   EKG Interpretation Date/Time:  Friday June 01 2023 15:51:25 EST Ventricular Rate:  54 PR Interval:  146 QRS Duration:  110 QT Interval:  450 QTC Calculation: 426 R Axis:   -8  Text Interpretation: Sinus bradycardia with Premature supraventricular complexes Incomplete right bundle branch block Minimal voltage criteria for LVH, may be normal variant ( R in aVL ) When compared with ECG of 07-Mar-2020 15:38, PREVIOUS ECG IS PRESENT Confirmed by York Pellant [phone]) on 06/01/2023 3:58:28 PM     Physical Exam:    VS:  BP  124/72 (BP Location: Left Arm)   Pulse (!) 54   Ht 5\' 4"  (1.626 m)   Wt 217 lb 3.2 oz (98.5 kg)   SpO2 98%   BMI 37.28 kg/m     Wt Readings from Last 3 Encounters:  06/01/23 217 lb 3.2 oz (98.5 kg)  12/12/22 217 lb (98.4 kg)  09/09/21 188 lb 3.2 oz (85.4 kg)     GEN:  Well nourished, well developed in no acute distress CARDIAC: RRR, no murmurs, rubs, gallops RESPIRATORY:  Normal work of breathing MUSCULOSKELETAL: no edema    ASSESSMENT & PLAN:     Persistent atrial fibrillation and atypical flutter Well-controlled with flecainide -- maybe two episodes a year Relatively scarce symptoms, but rates have historically been very high Monitoring with an Apple watch High risk medication - flecainide -- ECG reviewed. Labs ordered  Nonischemic cardiomyopathy Attributed to tachycardia mediated cardiomyopathy EF recovered by echocardiogram in 2021 Appears compensated and euvolemic today  Secondary hypercoagulable state CHA2DS2-VASc score is 4 Continue Xarelto 20 mg   Signed, Maurice Small, MD  06/01/2023 4:39 PM    [facility]

## 2023-06-01 NOTE — Patient Instructions (Addendum)
Medication Instructions:  Your physician recommends that you continue on your current medications as directed. Please refer to the Current Medication list given to you today.  Labwork: BMET & CBC @[facility] ([address] [place]) asap Non-fasting  Testing/Procedures:   Follow-Up: Your physician recommends that you schedule a follow-up appointment in: 6 months  Any Other Special Instructions Will Be Listed Below (If Applicable).  If you need a refill on your cardiac medications before your next appointment, please call your pharmacy.

## 2023-06-05 ENCOUNTER — Ambulatory Visit: Payer: BC Managed Care – PPO | Attending: Cardiovascular Disease

## 2023-06-06 LAB — BASIC METABOLIC PANEL
BUN/Creatinine Ratio: 15 (ref 9–20)
BUN: 14 mg/dL (ref 6–24)
CO2: 23 mmol/L (ref 20–29)
Calcium: 8.9 mg/dL (ref 8.7–10.2)
Chloride: 103 mmol/L (ref 96–106)
Creatinine, Ser: 0.92 mg/dL (ref 0.76–1.27)
Glucose: 52 mg/dL — ABNORMAL LOW (ref 70–99)
Potassium: 5.1 mmol/L (ref 3.5–5.2)
Sodium: 140 mmol/L (ref 134–144)
eGFR: 96 mL/min/{1.73_m2} (ref >59)

## 2023-06-06 LAB — CBC
Hematocrit: 37.5 % (ref 37.5–51.0)
Hemoglobin: 12 g/dL — ABNORMAL LOW (ref 13.0–17.7)
MCH: 32.5 pg (ref 26.6–33.0)
MCHC: 32 g/dL (ref 31.5–35.7)
MCV: 102 fL — ABNORMAL HIGH (ref 79–97)
Platelets: 180 10*3/uL (ref 150–450)
RBC: 3.69 x10E6/uL — ABNORMAL LOW (ref 4.14–5.80)
RDW: 11.3 % — ABNORMAL LOW (ref 11.6–15.4)
WBC: 5.3 10*3/uL (ref 3.4–10.8)

## 2023-06-29 ENCOUNTER — Ambulatory Visit: Payer: BC Managed Care – PPO | Admitting: Cardiovascular Disease

## 2023-07-23 ENCOUNTER — Ambulatory Visit: Payer: BC Managed Care – PPO | Admitting: Cardiovascular Disease

## 2023-08-10 NOTE — Progress Notes (Unsigned)
  Electrophysiology Office Note:   Date:  08/10/2023  ID:  Tyrone [name], DOB 31-Oct-1965, MRN [id number]  Primary Cardiologist: Hillis Range, MD (Inactive) Electrophysiologist: Maurice Small, MD  {Click to update primary MD,subspecialty MD or APP then REFRESH:1}    History of Present Illness:   Tyrone [name] is a 58 y.o. male with h/o AF, CVA, OSA on CPAP, GBS, seen today at request of PCP for questionable murmur.  Since last being seen in our clinic the patient reports doing ***.  he denies chest pain, palpitations, dyspnea, PND, orthopnea, nausea, vomiting, dizziness, syncope, edema, weight gain, or early satiety.   Review of systems complete and found to be negative unless listed in HPI.   EP Information / Studies Reviewed:    EKG is not ordered today. EKG from 06/01/2023 reviewed which showed sinus bradycardia at 54 bpm       Echo 10/2019 LVEF 55-60%, moderately dilated LA  Physical Exam:   VS:  There were no vitals taken for this visit.   Wt Readings from Last 3 Encounters:  06/01/23 217 lb 3.2 oz (98.5 kg)  12/12/22 217 lb (98.4 kg)  09/09/21 188 lb 3.2 oz (85.4 kg)     GEN: No acute distress NECK: No JVD; No carotid bruits CARDIAC: {EPRHYTHM:28826}, no murmurs, rubs, gallops RESPIRATORY:  Clear to auscultation without rales, wheezing or rhonchi  ABDOMEN: Soft, non-tender, non-distended EXTREMITIES:  {EDEMA LEVEL:28147::"No"} edema; No deformity   ASSESSMENT AND PLAN:    Persistent atrial fibrillation Atypical Flutter Stable intervals on EKG 05/2023 Rare symptoms Continue Xarelto for 20 mg for CHA2DS2VASc of at least 4 Continue monitoring with medication and regular visits for EKGs  HFrecEF Murmur PCP has requested echo  Secondary hypercoagulable state Pt on Xarelto as above   {Click here to Review PMH, Prob List, Meds, Allergies, SHx, FHx  :1}   Follow up with {WUJWJ:19147} {EPFOLLOW WG:95621}  Signed, Graciella Freer, PA-C

## 2023-08-13 ENCOUNTER — Encounter: Payer: Self-pay | Admitting: Student

## 2023-08-13 ENCOUNTER — Ambulatory Visit: Payer: 59 | Attending: Student | Admitting: Student

## 2023-08-13 VITALS — BP 128/82 | HR 68 | Ht 64.0 in | Wt 225.4 lb

## 2023-08-13 DIAGNOSIS — I4819 Other persistent atrial fibrillation: Secondary | ICD-10-CM | POA: Diagnosis not present

## 2023-08-13 DIAGNOSIS — I428 Other cardiomyopathies: Secondary | ICD-10-CM

## 2023-08-13 DIAGNOSIS — I10 Essential (primary) hypertension: Secondary | ICD-10-CM

## 2023-08-13 DIAGNOSIS — I484 Atypical atrial flutter: Secondary | ICD-10-CM | POA: Diagnosis not present

## 2023-08-13 DIAGNOSIS — R011 Cardiac murmur, unspecified: Secondary | ICD-10-CM

## 2023-08-13 NOTE — Patient Instructions (Signed)
 Medication Instructions:  Your physician recommends that you continue on your current medications as directed. Please refer to the Current Medication list given to you today.  *If you need a refill on your cardiac medications before your next appointment, please call your pharmacy*  Lab Work: None ordered If you have labs (blood work) drawn today and your tests are completely normal, you will receive your results only by: MyChart Message (if you have MyChart) OR A paper copy in the mail If you have any lab test that is abnormal or we need to change your treatment, we will call you to review the results.  Testing/Procedures: Your physician has requested that you have an echocardiogram. Echocardiography is a painless test that uses sound waves to create images of your heart. It provides your doctor with information about the size and shape of your heart and how well your heart's chambers and valves are working. This procedure takes approximately one hour. There are no restrictions for this procedure. Please do NOT wear cologne, perfume, aftershave, or lotions (deodorant is allowed). Please arrive 15 minutes prior to your appointment time.  Please note: We ask at that you not bring children with you during ultrasound (echo/ vascular) testing. Due to room size and safety concerns, children are not allowed in the ultrasound rooms during exams. Our front office staff cannot provide observation of children in our lobby area while testing is being conducted. An adult accompanying a patient to their appointment will only be allowed in the ultrasound room at the discretion of the ultrasound technician under special circumstances. We apologize for any inconvenience.   Follow-Up: At [facility], you and your health needs are our priority.  As part of our continuing mission to provide you with exceptional heart care, we have created designated Provider Care Teams.  These Care Teams include your  primary Cardiologist (physician) and Advanced Practice Providers (APPs -  Physician Assistants and Nurse Practitioners) who all work together to provide you with the care you need, when you need it.  Your next appointment:   6 month(s)  Provider:   York Pellant, MD

## 2023-08-14 ENCOUNTER — Ambulatory Visit (HOSPITAL_COMMUNITY): Payer: 59 | Attending: Student

## 2023-08-14 DIAGNOSIS — R011 Cardiac murmur, unspecified: Secondary | ICD-10-CM | POA: Insufficient documentation

## 2023-08-14 LAB — ECHOCARDIOGRAM COMPLETE: S' Lateral: 3.9 cm

## 2023-08-20 ENCOUNTER — Telehealth: Payer: Self-pay | Admitting: Cardiovascular Disease

## 2023-08-20 NOTE — Telephone Encounter (Signed)
Pt called in and would like to know if someone could call him about his echo results  Best number [phone]

## 2023-08-21 NOTE — Telephone Encounter (Signed)
Attempted to contact patient, left message to call office back

## 2023-08-22 NOTE — Telephone Encounter (Signed)
Patient is returning call and is requesting call back.

## 2023-08-22 NOTE — Telephone Encounter (Signed)
Spoke with pt and advised echo has not yet been reviewed by ordering provider, Otilio Saber.  Pt advised he will be contacted with result and recommendations as soon as this has been done.  Pt verbalizes understanding and thanked Charity fundraiser for the call.

## 2023-08-22 NOTE — Telephone Encounter (Signed)
Spoke with pt and advised of echo results per Otilio Saber as below.  Pt verbalizes understanding and thanked Charity fundraiser for the call.    Graciella Freer, PA-C 08/22/2023 11:42 AM EST     Echo is great, and very reassuring.   Showed normal heart function and trivial murmurs.  Nothing of clinical significance at this time.   Left atrium is dilated which is consistent with his history of AF, and is not of concern.

## 2023-08-31 ENCOUNTER — Other Ambulatory Visit (HOSPITAL_COMMUNITY): Payer: 59

## 2023-08-31 ENCOUNTER — Telehealth: Payer: Self-pay | Admitting: Cardiovascular Disease

## 2023-08-31 NOTE — Telephone Encounter (Signed)
 Patient c/o Palpitations:  STAT if patient reporting lightheadedness, shortness of breath, or chest pain  How long have you had palpitations/irregular HR/ Afib? Are you having the symptoms now? Since last Sunday he has been back in Afib, yes  Are you currently experiencing lightheadedness, SOB or CP? no  Do you have a history of afib (atrial fibrillation) or irregular heart rhythm? yes  Have you checked your BP or HR? (document readings if available): 60-70  Are you experiencing any other symptoms? no

## 2023-08-31 NOTE — Telephone Encounter (Signed)
 Called patient back about message. Patient stated his A. FIB started back last Sunday. Patient stated that his HR is usually in the high 40's, and now it is 60's to 70's. Patient stated when he was going through the airport earlier this week he was somewhat winded. Patient was wondering if he needed to take anything or follow-up with an office visit. Informed patient that a message would be sent to Dr. Nancey and his nurse. Patient stated he is fine getting a clinical cytogeneticist message with recommendations. Patient is currently taking metoprolol  12.5 mg BID.

## 2023-09-04 NOTE — Telephone Encounter (Signed)
Spoke with patient, currently in AF with rates under control (ranging from 80-110 bpm at rest) on metoprolol tart 12.5 mg BID, flecainide 50 mg BID, and xarelto.  Patient complains of fatigue and SOB on ambulation. Patient states in the past his episodes only lasted a couple of days but concerned since this episode is going on close to 3 weeks now. Instructed patient I would make Dr Nelly Laurence aware and ask for any recommendations.

## 2023-09-05 NOTE — Telephone Encounter (Signed)
Spoke with patient, DCCV scheduled for 2/17 and EKG nurse visit scheduled for 2/14. Instruction letter sent via MyChart at patient request. No missed doses of Xarelto per pt. Follow up appointment with Dr Nelly Laurence moved up to 09/24/23. No further needs at this time.  Mealor, Roberts Gaudy, MD to Me    09/04/23  8:01 PM I don't think we'll do a med change. DCCV is a good idea.  Have him come back to see me in clinic a few weeks after the cadioversion.

## 2023-09-06 ENCOUNTER — Telehealth: Payer: Self-pay | Admitting: Cardiovascular Disease

## 2023-09-06 NOTE — Telephone Encounter (Signed)
Spoke with patients wife (DPR on file) patient back in NSR according to watch. Symptoms have subsided. Cancelled RN visit for 2/14 and cancelled DCCV on 2/17. Patient will keep follow up appointment with Dr Nelly Laurence on 3/3. No further needs at this time

## 2023-09-06 NOTE — Telephone Encounter (Signed)
Pt spouse called in stating pt is no longer in afib and would like to cancel cardioversion. Please advise.

## 2023-09-07 ENCOUNTER — Ambulatory Visit: Payer: 59

## 2023-09-10 ENCOUNTER — Ambulatory Visit (HOSPITAL_COMMUNITY): Admission: RE | Admit: 2023-09-10 | Payer: 59 | Source: Home / Self Care | Admitting: Cardiology

## 2023-09-10 ENCOUNTER — Encounter (HOSPITAL_COMMUNITY): Admission: RE | Payer: 59 | Source: Home / Self Care

## 2023-09-10 DIAGNOSIS — I4819 Other persistent atrial fibrillation: Secondary | ICD-10-CM

## 2023-09-10 SURGERY — CARDIOVERSION (CATH LAB)
Anesthesia: General

## 2023-09-24 ENCOUNTER — Ambulatory Visit: Payer: 59 | Attending: Cardiovascular Disease | Admitting: Cardiovascular Disease

## 2023-09-24 ENCOUNTER — Encounter: Payer: Self-pay | Admitting: Cardiovascular Disease

## 2023-09-24 VITALS — BP 130/84 | HR 60 | Ht 66.0 in | Wt 214.8 lb

## 2023-09-24 DIAGNOSIS — I4819 Other persistent atrial fibrillation: Secondary | ICD-10-CM | POA: Diagnosis not present

## 2023-09-24 DIAGNOSIS — I484 Atypical atrial flutter: Secondary | ICD-10-CM

## 2023-09-24 DIAGNOSIS — I428 Other cardiomyopathies: Secondary | ICD-10-CM

## 2023-09-24 NOTE — Patient Instructions (Signed)
 Medication Instructions:  Your physician recommends that you continue on your current medications as directed. Please refer to the Current Medication list given to you today. *If you need a refill on your cardiac medications before your next appointment, please call your pharmacy*   Follow-Up: At [facility], you and your health needs are our priority.  As part of our continuing mission to provide you with exceptional heart care, we have created designated Provider Care Teams.  These Care Teams include your primary Cardiologist (physician) and Advanced Practice Providers (APPs -  Physician Assistants and Nurse Practitioners) who all work together to provide you with the care you need, when you need it.  We recommend signing up for the patient portal called "MyChart".  Sign up information is provided on this After Visit Summary.  MyChart is used to connect with patients for Virtual Visits (Telemedicine).  Patients are able to view lab/test results, encounter notes, upcoming appointments, etc.  Non-urgent messages can be sent to your provider as well.   To learn more about what you can do with MyChart, go to [email or web address].    Your next appointment:   6 month(s)  Provider:   York Pellant, MD

## 2023-09-24 NOTE — Progress Notes (Signed)
  Electrophysiology Office Note:    Date:  09/24/2023   ID:  Tyrone [name], DOB 04/20/1966, MRN [id number]  PCP:  Shon Hale, MD   [facility] Providers Cardiologist:  Hillis Range, MD (Inactive) Electrophysiologist:  Maurice Small, MD     Referring MD: Shon Hale, *   History of Present Illness:    Tyrone [name] is a 58 y.o. male with a medical history significant for atrial fibrillation, stroke, obstructive sleep apnea on CPAP, Guillain-Barr syndrome, bariatric surgery in 2004, who presents for follow-up.     He is a former patient of Dr. [name] and underwent ablations in about 2015, and a second ablation in 2019.  He was last seen in the electrophysiology clinic by Janean Sark on Dec 12, 2022.  At that time he was monitoring atrial fibrillation with his watch and was having very infrequent episodes.  He does not sense his atrial fibrillation, but receives notifications from his watch.  When initially detected, his ventricular rates in A-fib were often approaching 150 bpm.      Today, he reports that he is doing very well.  His last episode of atrial fibrillation was earlier this month.  His rates were about 108 bpm on average, he thinks.  I reviewed tracings from his Apple Watch and confirmed that they did show atrial fibrillation.  He concedes that he has not been extremely consistent with CPAP usage.  EKGs/Labs/Other Studies Reviewed Today:     Echocardiogram:  TTE 10/2019 EF 55-60%. Moderately dilated LA     EKG:   EKG Interpretation Date/Time:  Monday September 24 2023 09:37:03 EST Ventricular Rate:  60 PR Interval:  176 QRS Duration:  112 QT Interval:  442 QTC Calculation: 442 R Axis:   4  Text Interpretation: Sinus rhythm with PACs Incomplete right bundle branch block When compared with ECG of 01-Jun-2023 15:51, No significant change since last tracing Confirmed by York Pellant [id number]) on 09/24/2023 9:57:47 AM     Physical  Exam:    VS:  BP 130/84 (BP Location: Left Arm, Patient Position: Sitting, Cuff Size: Large)   Pulse 60   Ht 5\' 6"  (1.676 m)   Wt 214 lb 12.8 oz (97.4 kg)   SpO2 97%   BMI 34.67 kg/m     Wt Readings from Last 3 Encounters:  09/24/23 214 lb 12.8 oz (97.4 kg)  08/13/23 225 lb 6.4 oz (102.2 kg)  06/01/23 217 lb 3.2 oz (98.5 kg)     GEN:  Well nourished, well developed in no acute distress CARDIAC: RRR, no murmurs, rubs, gallops RESPIRATORY:  Normal work of breathing MUSCULOSKELETAL: no edema    ASSESSMENT & PLAN:     Persistent atrial fibrillation and atypical flutter Now having recurrences on flecainide --most recently a few weeks ago with an episode that lasted about 2 weeks; DC cardioversion was scheduled then canceled Having some symptoms of fatigue Monitoring with an Apple watch High risk medication - flecainide -- ECG reviewed. Labs reviewed from 08/10/2023 We discussed the potential of repeat mapping and ablation but advised him to pursue lifestyle changes --lose weight and be more consistent with CPAP  Nonischemic cardiomyopathy Attributed to tachycardia mediated cardiomyopathy EF recovered by echocardiogram in 2021 Appears compensated and euvolemic today  Secondary hypercoagulable state CHA2DS2-VASc score is 4 Continue Xarelto 20 mg   Signed, Maurice Small, MD  09/24/2023 10:15 AM    [facility]

## 2023-09-27 ENCOUNTER — Other Ambulatory Visit: Payer: Self-pay | Admitting: General Practice

## 2023-09-27 DIAGNOSIS — I4819 Other persistent atrial fibrillation: Secondary | ICD-10-CM

## 2023-09-27 NOTE — Telephone Encounter (Signed)
 Prescription refill request for Xarelto received.  Indication: afib  Last office visit: Mealor, 09/24/2023 Weight: 97.4 kg  Age: 58 yo  Scr: 0.92, 06/05/2023 CrCl: 142ml/min   Refill sent.

## 2023-11-23 ENCOUNTER — Ambulatory Visit: Payer: BC Managed Care – PPO | Admitting: Cardiovascular Disease

## 2023-12-04 ENCOUNTER — Other Ambulatory Visit: Payer: Self-pay | Admitting: General Practice

## 2023-12-04 ENCOUNTER — Other Ambulatory Visit: Payer: Self-pay | Admitting: Physician Assistant

## 2024-01-09 ENCOUNTER — Other Ambulatory Visit: Payer: Self-pay | Admitting: Physician Assistant

## 2024-03-24 ENCOUNTER — Other Ambulatory Visit: Payer: Self-pay | Admitting: General Practice

## 2024-03-24 DIAGNOSIS — I4819 Other persistent atrial fibrillation: Secondary | ICD-10-CM

## 2024-03-25 ENCOUNTER — Other Ambulatory Visit: Payer: Self-pay | Admitting: General Practice

## 2024-03-25 DIAGNOSIS — I4819 Other persistent atrial fibrillation: Secondary | ICD-10-CM

## 2024-03-25 NOTE — Telephone Encounter (Signed)
 Prescription refill request for Xarelto  received.  Indication: AF Last office visit: 3/25 Weight: 97.4 Age: 58 Scr: 0.92 CrCl: 119

## 2024-07-07 ENCOUNTER — Telehealth: Payer: Self-pay | Admitting: Cardiovascular Disease

## 2024-07-07 NOTE — Telephone Encounter (Signed)
 Patient c/o Palpitations: STAT if patient c/o lightheadedness, shortness of breath, or chest pain  How long have you had palpitations/irregular HR/ Afib? Are you having the symptoms now?  In and out of afib the past 3 weeks. Still in afib right now.  Are you currently experiencing lightheadedness, SOB or CP?  No   Do you have a history of afib (atrial fibrillation) or irregular heart rhythm?  Hx afib  Have you checked your BP or HR? (document readings if available):  HR is remaining above 100 Hasn't checked BP   Are you experiencing any other symptoms?  Tiredness, SOB (not currently)

## 2024-07-07 NOTE — Telephone Encounter (Signed)
 Spoke with the patient who states that about three weeks ago he was out of town and went into afib. He has been in and out every since.  He has been monitoring his rhythm with his apple watch.  His heart rate had been controlled, staying in the 70s, up until yesterday. His heart rate yesterday was averaging 109 and got up to 128. He states that heart rate today has been 90-110. He reports that he feels tired and gets winded with exertion. His blood pressure earlier today was 118/88. He confirms that he is taking xarelto , flecainide  and metoprolol  as prescribed. Advised to take a whole tablet of metoprolol  tonight instead of half tablet. Will check with afib clinic for an appointment and Dr. Nancey for any further recommendations.

## 2024-07-18 ENCOUNTER — Ambulatory Visit (HOSPITAL_COMMUNITY): Admitting: Physician Assistant

## 2024-08-16 ENCOUNTER — Encounter (HOSPITAL_BASED_OUTPATIENT_CLINIC_OR_DEPARTMENT_OTHER): Payer: Self-pay | Admitting: *Deleted

## 2024-08-16 ENCOUNTER — Inpatient Hospital Stay (HOSPITAL_BASED_OUTPATIENT_CLINIC_OR_DEPARTMENT_OTHER)
Admission: EM | Admit: 2024-08-16 | Discharge: 2024-08-18 | DRG: 193 | Disposition: A | Attending: Internal Medicine | Admitting: Internal Medicine

## 2024-08-16 ENCOUNTER — Other Ambulatory Visit: Payer: Self-pay

## 2024-08-16 ENCOUNTER — Emergency Department (HOSPITAL_BASED_OUTPATIENT_CLINIC_OR_DEPARTMENT_OTHER)

## 2024-08-16 DIAGNOSIS — J9601 Acute respiratory failure with hypoxia: Secondary | ICD-10-CM | POA: Diagnosis present

## 2024-08-16 DIAGNOSIS — Z87891 Personal history of nicotine dependence: Secondary | ICD-10-CM | POA: Diagnosis not present

## 2024-08-16 DIAGNOSIS — Z7901 Long term (current) use of anticoagulants: Secondary | ICD-10-CM

## 2024-08-16 DIAGNOSIS — K449 Diaphragmatic hernia without obstruction or gangrene: Secondary | ICD-10-CM | POA: Diagnosis present

## 2024-08-16 DIAGNOSIS — M545 Low back pain, unspecified: Secondary | ICD-10-CM | POA: Diagnosis present

## 2024-08-16 DIAGNOSIS — G4733 Obstructive sleep apnea (adult) (pediatric): Secondary | ICD-10-CM | POA: Diagnosis present

## 2024-08-16 DIAGNOSIS — I428 Other cardiomyopathies: Secondary | ICD-10-CM | POA: Diagnosis present

## 2024-08-16 DIAGNOSIS — Z823 Family history of stroke: Secondary | ICD-10-CM | POA: Diagnosis not present

## 2024-08-16 DIAGNOSIS — R042 Hemoptysis: Secondary | ICD-10-CM | POA: Diagnosis present

## 2024-08-16 DIAGNOSIS — I4819 Other persistent atrial fibrillation: Secondary | ICD-10-CM | POA: Diagnosis present

## 2024-08-16 DIAGNOSIS — J189 Pneumonia, unspecified organism: Secondary | ICD-10-CM

## 2024-08-16 DIAGNOSIS — Z8419 Family history of other disorders of kidney and ureter: Secondary | ICD-10-CM

## 2024-08-16 DIAGNOSIS — Z9884 Bariatric surgery status: Secondary | ICD-10-CM | POA: Diagnosis not present

## 2024-08-16 DIAGNOSIS — Z8673 Personal history of transient ischemic attack (TIA), and cerebral infarction without residual deficits: Secondary | ICD-10-CM | POA: Diagnosis not present

## 2024-08-16 DIAGNOSIS — Z87898 Personal history of other specified conditions: Secondary | ICD-10-CM | POA: Diagnosis not present

## 2024-08-16 DIAGNOSIS — Z833 Family history of diabetes mellitus: Secondary | ICD-10-CM

## 2024-08-16 DIAGNOSIS — I480 Paroxysmal atrial fibrillation: Secondary | ICD-10-CM | POA: Diagnosis not present

## 2024-08-16 DIAGNOSIS — Z6837 Body mass index (BMI) 37.0-37.9, adult: Secondary | ICD-10-CM

## 2024-08-16 DIAGNOSIS — I110 Hypertensive heart disease with heart failure: Secondary | ICD-10-CM | POA: Diagnosis present

## 2024-08-16 DIAGNOSIS — J188 Other pneumonia, unspecified organism: Secondary | ICD-10-CM | POA: Diagnosis not present

## 2024-08-16 DIAGNOSIS — I2721 Secondary pulmonary arterial hypertension: Secondary | ICD-10-CM | POA: Diagnosis present

## 2024-08-16 DIAGNOSIS — J181 Lobar pneumonia, unspecified organism: Secondary | ICD-10-CM | POA: Diagnosis not present

## 2024-08-16 DIAGNOSIS — Z1152 Encounter for screening for COVID-19: Secondary | ICD-10-CM

## 2024-08-16 DIAGNOSIS — I5032 Chronic diastolic (congestive) heart failure: Secondary | ICD-10-CM | POA: Diagnosis present

## 2024-08-16 DIAGNOSIS — Z8249 Family history of ischemic heart disease and other diseases of the circulatory system: Secondary | ICD-10-CM | POA: Diagnosis not present

## 2024-08-16 DIAGNOSIS — Z8709 Personal history of other diseases of the respiratory system: Secondary | ICD-10-CM | POA: Diagnosis not present

## 2024-08-16 DIAGNOSIS — Z79899 Other long term (current) drug therapy: Secondary | ICD-10-CM | POA: Diagnosis not present

## 2024-08-16 DIAGNOSIS — J13 Pneumonia due to Streptococcus pneumoniae: Principal | ICD-10-CM | POA: Diagnosis present

## 2024-08-16 DIAGNOSIS — I272 Pulmonary hypertension, unspecified: Secondary | ICD-10-CM | POA: Diagnosis not present

## 2024-08-16 DIAGNOSIS — I700 Atherosclerosis of aorta: Secondary | ICD-10-CM | POA: Diagnosis present

## 2024-08-16 DIAGNOSIS — K219 Gastro-esophageal reflux disease without esophagitis: Secondary | ICD-10-CM | POA: Diagnosis present

## 2024-08-16 LAB — CBC
HCT: 34.5 % — ABNORMAL LOW (ref 39.0–52.0)
Hemoglobin: 11.7 g/dL — ABNORMAL LOW (ref 13.0–17.0)
MCH: 33.7 pg (ref 26.0–34.0)
MCHC: 33.9 g/dL (ref 30.0–36.0)
MCV: 99.4 fL (ref 80.0–100.0)
Platelets: 179 10*3/uL (ref 150–400)
RBC: 3.47 MIL/uL — ABNORMAL LOW (ref 4.22–5.81)
RDW: 13.3 % (ref 11.5–15.5)
WBC: 8.3 10*3/uL (ref 4.0–10.5)
nRBC: 0 % (ref 0.0–0.2)

## 2024-08-16 LAB — URINALYSIS, W/ REFLEX TO CULTURE (INFECTION SUSPECTED)
Bacteria, UA: NONE SEEN
Bilirubin Urine: NEGATIVE
Glucose, UA: NEGATIVE mg/dL
Hgb urine dipstick: NEGATIVE
Ketones, ur: 15 mg/dL — AB
Leukocytes,Ua: NEGATIVE
Nitrite: NEGATIVE
Protein, ur: NEGATIVE mg/dL
Specific Gravity, Urine: >1.046 — ABNORMAL HIGH (ref 1.005–1.030)
pH: 6 (ref 5.0–8.0)

## 2024-08-16 LAB — BASIC METABOLIC PANEL WITH GFR
Anion gap: 15 (ref 5–15)
BUN: 15 mg/dL (ref 6–20)
CO2: 22 mmol/L (ref 22–32)
Calcium: 8.7 mg/dL — ABNORMAL LOW (ref 8.9–10.3)
Chloride: 100 mmol/L (ref 98–111)
Creatinine, Ser: 0.86 mg/dL (ref 0.61–1.24)
GFR, Estimated: >60 mL/min
Glucose, Bld: 101 mg/dL — ABNORMAL HIGH (ref 70–99)
Potassium: 4 mmol/L (ref 3.5–5.1)
Sodium: 136 mmol/L (ref 135–145)

## 2024-08-16 LAB — RESP PANEL BY RT-PCR (RSV, FLU A&B, COVID)  RVPGX2
Influenza A by PCR: NEGATIVE
Influenza B by PCR: NEGATIVE
Resp Syncytial Virus by PCR: NEGATIVE
SARS Coronavirus 2 by RT PCR: NEGATIVE

## 2024-08-16 LAB — HEPATIC FUNCTION PANEL
ALT: 20 U/L (ref 0–44)
AST: 17 U/L (ref 15–41)
Albumin: 4.1 g/dL (ref 3.5–5.0)
Alkaline Phosphatase: 71 U/L (ref 38–126)
Bilirubin, Direct: 0.3 mg/dL — ABNORMAL HIGH (ref 0.0–0.2)
Indirect Bilirubin: 0.3 mg/dL (ref 0.3–0.9)
Total Bilirubin: 0.6 mg/dL (ref 0.0–1.2)
Total Protein: 6.3 g/dL — ABNORMAL LOW (ref 6.5–8.1)

## 2024-08-16 LAB — LACTIC ACID, PLASMA
Lactic Acid, Venous: 1.8 mmol/L (ref 0.5–1.9)
Lactic Acid, Venous: 1.4 mmol/L (ref 0.5–1.9)

## 2024-08-16 LAB — STREP PNEUMONIAE URINARY ANTIGEN: Strep Pneumo Urinary Antigen: POSITIVE — AB

## 2024-08-16 LAB — TYPE AND SCREEN
ABO/RH(D): B POS
Antibody Screen: NEGATIVE

## 2024-08-16 LAB — ABO/RH: ABO/RH(D): B POS

## 2024-08-16 LAB — C-REACTIVE PROTEIN: CRP: 0.7 mg/dL

## 2024-08-16 LAB — TROPONIN T, HIGH SENSITIVITY: Troponin T High Sensitivity: 11 ng/L (ref 0–19)

## 2024-08-16 LAB — PRO BRAIN NATRIURETIC PEPTIDE: Pro Brain Natriuretic Peptide: 446 pg/mL — ABNORMAL HIGH

## 2024-08-16 LAB — PROCALCITONIN: Procalcitonin: 1.84 ng/mL

## 2024-08-16 MED ORDER — SODIUM CHLORIDE 0.9 % IV SOLN
2.0000 g | Freq: Once | INTRAVENOUS | Status: AC
  Administered 2024-08-16: 2 g via INTRAVENOUS
  Filled 2024-08-16: qty 20

## 2024-08-16 MED ORDER — LACTATED RINGERS IV SOLN: INTRAVENOUS | Status: AC

## 2024-08-16 MED ORDER — ACETAMINOPHEN 650 MG RE SUPP: 650.0000 mg | Freq: Four times a day (QID) | RECTAL | Status: DC | PRN

## 2024-08-16 MED ORDER — AMLODIPINE BESYLATE 5 MG PO TABS: 5.0000 mg | ORAL_TABLET | Freq: Every day | ORAL | Status: DC

## 2024-08-16 MED ORDER — SODIUM CHLORIDE 0.9 % IV SOLN
2.0000 g | INTRAVENOUS | Status: DC
  Administered 2024-08-17 – 2024-08-18 (×2): 2 g via INTRAVENOUS
  Filled 2024-08-16 (×2): qty 20

## 2024-08-16 MED ORDER — SODIUM CHLORIDE 0.9 % IV SOLN: 1.0000 g | Freq: Once | INTRAVENOUS | Status: DC

## 2024-08-16 MED ORDER — SODIUM CHLORIDE 0.9 % IV SOLN: 500.0000 mg | INTRAVENOUS | Status: DC

## 2024-08-16 MED ORDER — VITAMIN E 180 MG (400 UNIT) PO CAPS
400.0000 [IU] | ORAL_CAPSULE | Freq: Every day | ORAL | Status: DC
  Administered 2024-08-16: 400 [IU] via ORAL
  Filled 2024-08-16: qty 1

## 2024-08-16 MED ORDER — SENNOSIDES-DOCUSATE SODIUM 8.6-50 MG PO TABS: 1.0000 | ORAL_TABLET | Freq: Every evening | ORAL | Status: DC | PRN

## 2024-08-16 MED ORDER — SODIUM CHLORIDE 0.9 % IV SOLN: 2.0000 g | INTRAVENOUS | Status: DC

## 2024-08-16 MED ORDER — AZITHROMYCIN 250 MG PO TABS: 500.0000 mg | ORAL_TABLET | Freq: Every day | ORAL | Status: DC

## 2024-08-16 MED ORDER — LACTATED RINGERS IV SOLN: INTRAVENOUS | Status: DC

## 2024-08-16 MED ORDER — SODIUM CHLORIDE 0.9 % IV SOLN: 500.0000 mg | Freq: Once | INTRAVENOUS | Status: DC

## 2024-08-16 MED ORDER — METOPROLOL TARTRATE 12.5 MG HALF TABLET
12.5000 mg | ORAL_TABLET | Freq: Two times a day (BID) | ORAL | Status: DC
  Administered 2024-08-16 – 2024-08-18 (×4): 12.5 mg via ORAL
  Filled 2024-08-16 (×4): qty 1

## 2024-08-16 MED ORDER — FOLIC ACID 1 MG PO TABS
1.0000 mg | ORAL_TABLET | Freq: Every day | ORAL | Status: DC
  Administered 2024-08-17 – 2024-08-18 (×2): 1 mg via ORAL
  Filled 2024-08-16 (×2): qty 1

## 2024-08-16 MED ORDER — FLECAINIDE ACETATE 50 MG PO TABS
50.0000 mg | ORAL_TABLET | Freq: Two times a day (BID) | ORAL | Status: DC
  Administered 2024-08-16 – 2024-08-18 (×5): 50 mg via ORAL
  Filled 2024-08-16 (×5): qty 1

## 2024-08-16 MED ORDER — METOPROLOL TARTRATE 25 MG PO TABS
25.0000 mg | ORAL_TABLET | Freq: Two times a day (BID) | ORAL | Status: DC
  Filled 2024-08-16: qty 1

## 2024-08-16 MED ORDER — AZITHROMYCIN 250 MG PO TABS
500.0000 mg | ORAL_TABLET | Freq: Every day | ORAL | Status: DC
  Administered 2024-08-16: 500 mg via ORAL
  Filled 2024-08-16: qty 2

## 2024-08-16 MED ORDER — ACETAMINOPHEN 325 MG PO TABS
650.0000 mg | ORAL_TABLET | Freq: Four times a day (QID) | ORAL | Status: DC | PRN
  Administered 2024-08-16 – 2024-08-17 (×2): 650 mg via ORAL
  Filled 2024-08-16 (×3): qty 2

## 2024-08-16 MED ORDER — PANTOPRAZOLE SODIUM 40 MG PO TBEC
40.0000 mg | DELAYED_RELEASE_TABLET | Freq: Every day | ORAL | Status: DC
  Administered 2024-08-16 – 2024-08-18 (×3): 40 mg via ORAL
  Filled 2024-08-16 (×3): qty 1

## 2024-08-16 MED ORDER — PROMETHAZINE HCL 25 MG PO TABS: 12.5000 mg | ORAL_TABLET | Freq: Four times a day (QID) | ORAL | Status: DC | PRN

## 2024-08-16 MED ORDER — VITAMIN E 180 MG (400 UNIT) PO CAPS: 400.0000 [IU] | ORAL_CAPSULE | Freq: Every day | ORAL | Status: DC

## 2024-08-16 MED ORDER — IOHEXOL 350 MG/ML SOLN
75.0000 mL | Freq: Once | INTRAVENOUS | Status: AC | PRN
  Administered 2024-08-16: 75 mL via INTRAVENOUS

## 2024-08-16 NOTE — Evaluation (Signed)
 Clinical/Bedside Swallow Evaluation Patient Details  Name: Tyrone [name] MRN: [id number] Date of Birth: 1965/11/17  Today's Date: 08/16/2024 Time: SLP Start Time (ACUTE ONLY): 1514 SLP Stop Time (ACUTE ONLY): 1522 SLP Time Calculation (min) (ACUTE ONLY): 8 min  Past Medical History:  Past Medical History:  Diagnosis Date   Anemia, iron deficiency 01/30/2017   Cervicalgia    CVA (cerebral infarction) 05/01/12   on pradaxa  at time of stroke, switched to xarelto    External thrombosed hemorrhoids    GERD (gastroesophageal reflux disease)    Guillain Barr syndrome    following seasonal influenza vaccination   Guillain-Barre syndrome    from stroke in 2013   Hematuria    a. 01/2012 - to f/u with urology as outpt.   Hernia of unspecified site of abdominal cavity without mention of obstruction or gangrene    of abdominal   Hx of gastric bypass 01/30/2017   2004 Dr [facility] weight 450 lbs   Hypertension    Iron malabsorption 01/30/2017   Hx gastric bypass 2004   Morbid obesity s/p gastric bypass    Nonischemic cardiomyopathy (HCC)    a. 01/2012 Echo: EF 35-40%, Diff HK, mild MR;  b. 01/2012 R&L Heart Cath: mildly elevated R heart pressures (pcwp 17) , nl cors.   Osteoarthrosis, unspecified whether generalized or localized, ankle and foot    of the knees,ankle and foot   Pancytopenia    Persistent atrial fibrillation (HCC)    a. Pradaxa  started 01/2012;  b .s/p TEE/DCCV 02/16/2012; c. recurrent afib->Tikosyn  Initiation 03/2012   Sleep apnea    CPAP compliant   Status post bypass gastrojejunostomy 01/30/2017   Stroke (HCC)    Unspecified intestinal malabsorption    Urticaria, unspecified    Vitamin B 12 deficiency    Past Surgical History:  Past Surgical History:  Procedure Laterality Date   ATRIAL FIBRILLATION ABLATION N/A 06/25/2012   PVI by Dr Kelsie   ATRIAL FIBRILLATION ABLATION N/A 10/04/2017   Procedure: ATRIAL FIBRILLATION ABLATION;  Surgeon: Kelsie Agent, MD;   Location: [facility] INVASIVE CV LAB;  Service: Cardiovascular;  Laterality: N/A;   CARDIOVERSION  02/16/2012   Procedure: CARDIOVERSION;  Surgeon: Redell GORMAN Shallow, MD;  Location: [facility] ENDOSCOPY;  Service: Cardiovascular;  Laterality: N/A;   CARDIOVERSION  04/22/2012   Procedure: CARDIOVERSION;  Surgeon: Elspeth JAYSON Sage, MD;  Location: [facility] ENDOSCOPY;  Service: Cardiovascular;  Laterality: N/A;   CARDIOVERSION N/A 08/22/2017   Procedure: CARDIOVERSION;  Surgeon: Raford Riggs, MD;  Location: [facility] ENDOSCOPY;  Service: Cardiovascular;  Laterality: N/A;   CARDIOVERSION N/A 06/11/2018   Procedure: CARDIOVERSION;  Surgeon: Alveta Aleene PARAS, MD;  Location: [facility] ENDOSCOPY;  Service: Cardiovascular;  Laterality: N/A;   CARDIOVERSION N/A 04/29/2019   Procedure: CARDIOVERSION;  Surgeon: Barbaraann Darryle Ned, MD;  Location: [facility] ENDOSCOPY;  Service: Endoscopy;  Laterality: N/A;   CARDIOVERSION N/A 06/18/2019   Procedure: CARDIOVERSION;  Surgeon: Raford Riggs, MD;  Location: [facility] ENDOSCOPY;  Service: Cardiovascular;  Laterality: N/A;   COLONOSCOPY N/A 07/07/2016   Procedure: COLONOSCOPY;  Surgeon: Belvie Just, MD;  Location: [facility] ENDOSCOPY;  Service: Endoscopy;  Laterality: N/A;   ESOPHAGOGASTRODUODENOSCOPY N/A 07/07/2016   Procedure: ESOPHAGOGASTRODUODENOSCOPY (EGD);  Surgeon: Belvie Just, MD;  Location: [facility] ENDOSCOPY;  Service: Endoscopy;  Laterality: N/A;   GASTRIC BYPASS  2004   HERNIA REPAIR     LEFT AND RIGHT HEART CATHETERIZATION WITH CORONARY ANGIOGRAM N/A 02/13/2012   Procedure: LEFT AND RIGHT HEART CATHETERIZATION WITH CORONARY ANGIOGRAM;  Surgeon: Ozell Fell, MD;  Location: [facility] CATH LAB;  Service: Cardiovascular;  Laterality: N/A;   TEE WITHOUT CARDIOVERSION  02/16/2012   Procedure: TRANSESOPHAGEAL ECHOCARDIOGRAM (TEE);  Surgeon: Redell GORMAN Shallow, MD;  Location: [facility] ENDOSCOPY;  Service: Cardiovascular;  Laterality: N/A;  10a CV   TEE WITHOUT CARDIOVERSION  06/24/2012   Procedure: TRANSESOPHAGEAL  ECHOCARDIOGRAM (TEE);  Surgeon: Peter M Jordan, MD;  Location: [facility] ENDOSCOPY;  Service: Cardiovascular;  Laterality: N/A;   TEE WITHOUT CARDIOVERSION N/A 08/22/2017   Procedure: TRANSESOPHAGEAL ECHOCARDIOGRAM (TEE);  Surgeon: Raford Riggs, MD;  Location: [facility] ENDOSCOPY;  Service: Cardiovascular;  Laterality: N/A;   TEE WITHOUT CARDIOVERSION N/A 06/11/2018   Procedure: TRANSESOPHAGEAL ECHOCARDIOGRAM (TEE);  Surgeon: Alveta Aleene PARAS, MD;  Location: [facility] ENDOSCOPY;  Service: Cardiovascular;  Laterality: N/A;   UMBILICAL HERNIA REPAIR     x 2   HPI:  Tyrone [name] is a 59 y.o. male who presented to [facility] after initially presenting to [facility] on 08/16/24 with multiple episodes of coughing followed by chills, subjective fevers and low back pain and subsequently blood in his sputum. CT scan of the chest showing multifocal right-sided pneumonia, he was diagnosed with mild hemoptysis. PMH: OSA, no longer uses CPAP, Guillain Barre, gastric bypass, nonischemic cardiomyopathy.    Assessment / Plan / Recommendation  Clinical Impression  Patient is not presenting with clinical s/s of dysphagia as per this bedside swallow evaluation. He and spouse both deny any recent or current h/o dysphagia. SLP assessed his swallow as he drank thin liquids via straw and ate regular solids. No overt s/s, swallow initiation was timely. No further skilled SLP intevention warranted at this time, recommend regular solids, thin liquids SLP Visit Diagnosis: Dysphagia, unspecified (R13.10)    Aspiration Risk  No limitations    Diet Recommendation Regular;Thin liquid    Liquid Administration via: Cup;Straw Medication Administration: Whole meds with liquid Supervision: Patient able to self feed Compensations: Slow rate Postural Changes: Seated upright at 90 degrees    Other Recommendations       Swallow Evaluation Recommendations     Assistance Recommended at Discharge    Functional Status Assessment Patient has not had  a recent decline in their functional status  Frequency and Duration            Prognosis        Swallow Study   General Date of Onset: 08/16/24 HPI: Tyrone [name] is a 59 y.o. male who presented to [facility] after initially presenting to [facility] on 08/16/24 with multiple episodes of coughing followed by chills, subjective fevers and low back pain and subsequently blood in his sputum. CT scan of the chest showing multifocal right-sided pneumonia, he was diagnosed with mild hemoptysis. PMH: OSA, no longer uses CPAP, Guillain Barre, gastric bypass, nonischemic cardiomyopathy. Type of Study: Bedside Swallow Evaluation Previous Swallow Assessment: none found Diet Prior to this Study: NPO Temperature Spikes Noted: No Respiratory Status: Nasal cannula History of Recent Intubation: No Behavior/Cognition: Alert;Cooperative;Pleasant mood Oral Cavity Assessment: Within Functional Limits Oral Care Completed by SLP: No Oral Cavity - Dentition: Adequate natural dentition Vision: Functional for self-feeding Self-Feeding Abilities: Able to feed self Patient Positioning: Upright in bed Baseline Vocal Quality: Normal Volitional Cough: Strong Volitional Swallow: Able to elicit    Oral/Motor/Sensory Function Overall Oral Motor/Sensory Function: Within functional limits   Ice Chips     Thin Liquid Thin Liquid: Within functional limits Presentation: Self Fed;Straw    Nectar Thick     Honey Thick  Puree     Solid     Solid: Within functional limits Presentation: Self Fed     Norleen IVAR Blase, MA, CCC-SLP Speech Therapy  08/16/2024,3:55 PM

## 2024-08-16 NOTE — Progress Notes (Addendum)
 Plan of Care Note for accepted transfer   Patient: Tyrone [name] MRN: [id number]   DOA: 08/16/2024  Facility requesting transfer: [facility]. Requesting Provider: Juliene Bicker, MD Reason for transfer: Acute respiratory failure with hypoxia. Facility course:  Accepted to a telemetry bed due to acute respiratory failure with hypoxia with an O2 sat of 84 to 88% on room air during triage.  He is requiring O2 at 2 LPM.  Lab work with some minor abnormalities, but otherwise normal.  Imaging showing multifocal pneumonia.  He has been ordered CAP antibiotic coverage.  He will be admitted by our telemedicine team.  Plan of care: The patient is accepted for admission to Telemetry unit, at [facility]..  `  Author: Alm Dorn Castor, MD 08/16/2024  Check [email or web address] for on-call coverage.  Nursing staff, Please call [facility] Admits & Consults System-Wide number on Amion as soon as patient's arrival, so appropriate admitting provider can evaluate the pt.

## 2024-08-16 NOTE — ED Provider Notes (Signed)
 CT scan concerning for multifocal pneumonia.  No obvious mass or blood clot.  [name] low-grade fever cough chills.  Will start IV antibiotics finish sepsis workup and admitted to medicine for further infectious care.  He is got new oxygen requirement hypoxia.  He stable on 2 L of oxygen.  Viral panel is negative.  He is hemodynamically stable on his 2 L of oxygen.  Will admit him for further respiratory failure/pneumonia.  This chart was dictated using voice recognition software.  Despite best efforts to proofread,  errors can occur which can change the documentation meaning.    Ruthe Cornet, DO 08/16/24 0830

## 2024-08-16 NOTE — Sepsis Progress Note (Signed)
 Sepsis protocol is being followed by [facility].

## 2024-08-16 NOTE — ED Provider Notes (Signed)
 " [place] EMERGENCY DEPARTMENT AT [facility] Provider Note   CSN: [id number] Arrival date & time: 08/16/24  9441     Patient presents with: No chief complaint on file.   Tyrone [name] is a 59 y.o. male.   HPI   59 year old male with medical history significant for obesity status post gastric bypass, abdominal hernia, GERD, atrial fibrillation on Xarelto , CVA, OSA on CPAP, Guillain-Barre syndrome, nonischemic cardiomyopathy senting to the emergency department with acute onset hemoptysis and cough and shortness of breath.  The patient endorses fevers as well at home.  Symptoms have been ongoing for the past few hours.  He also endorses bilateral lower back pain which he attributes to spending a lot of time cooking in the kitchen yesterday in preparation for the upcoming storm.  He put some Biofreeze on it with some relief.  4 hours ago he had acute onset of chills and a cough with associated hemoptysis with bright red blood noted mixed in within his sputum.  He is on Xarelto  and has not missed any doses.  He endorses dyspnea while lying flat.  He arrived to the emergency department hypoxic with O2 sats 84 to 87% in triage on room air.  He is not on home O2.  No chest pain.  No ripping tearing pain in the chest radiating to the back.  Prior to Admission medications  Medication Sig Start Date End Date Taking? Authorizing Provider  amLODipine  (NORVASC ) 5 MG tablet Take 1 tablet by mouth once daily 12/04/23   Mealor, Augustus E, MD  ferrous sulfate 325 (65 FE) MG tablet Take 325 mg by mouth daily.     [provider]  flecainide  (TAMBOCOR ) 50 MG tablet Take 1 tablet by mouth twice daily 01/09/24   Leverne Charlies Helling, PA-C  folic acid  (FOLVITE ) 1 MG tablet Take 1 tablet by mouth daily.    [provider]  furosemide  (LASIX ) 40 MG tablet Take 1/2 (one-half) tablet by mouth once daily 12/04/23   Mealor, Augustus E, MD  metoprolol  tartrate (LOPRESSOR ) 25 MG tablet Take 1/2  (one-half) tablet by mouth twice daily 12/04/23   Mealor, Augustus E, MD  Multiple Vitamin (MULTIVITAMIN) tablet Take 1-2 tablets by mouth See admin instructions. Take 2 tablets in the morning and 1 tablet in the evening    [provider]  omeprazole (PRILOSEC) 40 MG capsule Take 40 mg by mouth daily.  09/02/16   [provider]  rivaroxaban  (XARELTO ) 20 MG TABS tablet TAKE 1 TABLET BY MOUTH ONCE DAILY WITH SUPPER 03/25/24   Mealor, Augustus E, MD  sildenafil (VIAGRA) 50 MG tablet Take 50-100 mg by mouth daily as needed. 03/29/23   [provider]  spironolactone  (ALDACTONE ) 25 MG tablet Take 1 tablet by mouth once daily 12/04/23   Mealor, Augustus E, MD  vitamin E  180 MG (400 UNITS) capsule Take 400 Units by mouth daily.    [provider]    Allergies: Patient has no known allergies.    Review of Systems  All other systems reviewed and are negative.   Updated Vital Signs BP 116/69   Pulse 78   Temp 99.5 F (37.5 C) (Oral)   Resp (!) 27   SpO2 94%   Physical Exam Vitals and nursing note reviewed.  Constitutional:      General: He is not in acute distress.    Appearance: He is well-developed.  HENT:     Head: Normocephalic and atraumatic.  Eyes:  Conjunctiva/sclera: Conjunctivae normal.  Cardiovascular:     Rate and Rhythm: Normal rate and regular rhythm.  Pulmonary:     Effort: Pulmonary effort is normal. No respiratory distress.     Breath sounds: Wheezing and rales present.  Abdominal:     Palpations: Abdomen is soft.     Tenderness: There is no abdominal tenderness.  Musculoskeletal:        General: No swelling.     Cervical back: Neck supple.  Skin:    General: Skin is warm and dry.     Capillary Refill: Capillary refill takes less than 2 seconds.  Neurological:     Mental Status: He is alert.  Psychiatric:        Mood and Affect: Mood normal.     (all labs ordered are listed, but only abnormal results are displayed) Labs  Reviewed  BASIC METABOLIC PANEL WITH GFR - Abnormal; Notable for the following components:      Result Value   Glucose, Bld 101 (*)    Calcium  8.7 (*)    All other components within normal limits  CBC - Abnormal; Notable for the following components:   RBC 3.47 (*)    Hemoglobin 11.7 (*)    HCT 34.5 (*)    All other components within normal limits  PRO BRAIN NATRIURETIC PEPTIDE - Abnormal; Notable for the following components:   Pro Brain Natriuretic Peptide 446.0 (*)    All other components within normal limits  RESP PANEL BY RT-PCR (RSV, FLU A&B, COVID)  [id number]  CULTURE, BLOOD (ROUTINE X 2)  CULTURE, BLOOD (ROUTINE X 2)  URINALYSIS, W/ REFLEX TO CULTURE (INFECTION SUSPECTED)  TROPONIN T, HIGH SENSITIVITY    EKG: EKG Interpretation Date/Time:  Saturday August 16 2024 06:09:05 EST Ventricular Rate:  87 PR Interval:  171 QRS Duration:  126 QT Interval:  378 QTC Calculation: 455 R Axis:   0  Text Interpretation: Sinus rhythm Right bundle branch block Nonspecific ST abnormality Confirmed by Jerrol Agent ([id number]) on 08/16/2024 6:14:57 AM  Radiology: No results found.   Procedures   Medications Ordered in the ED - No data to display                                  Medical Decision Making Amount and/or Complexity of Data Reviewed Labs: ordered. Radiology: ordered.    59 year old male with medical history significant for obesity status post gastric bypass, abdominal hernia, GERD, atrial fibrillation on Xarelto , CVA, OSA on CPAP, Guillain-Barre syndrome, nonischemic cardiomyopathy senting to the emergency department with acute onset hemoptysis and cough and shortness of breath.  The patient endorses fevers as well at home.  Symptoms have been ongoing for the past few hours.  He also endorses bilateral lower back pain which he attributes to spending a lot of time cooking in the kitchen yesterday in preparation for the upcoming storm.  He put some Biofreeze on it with some  relief.  4 hours ago he had acute onset of chills and a cough with associated hemoptysis with bright red blood noted mixed in within his sputum.  He is on Xarelto  and has not missed any doses.  He endorses dyspnea while lying flat.  He arrived to the emergency department hypoxic with O2 sats 84 to 87% in triage on room air.  He is not on home O2.  No chest pain.  No ripping tearing pain in the chest radiating  to the back.  On arrival, the patient was afebrile, temperature 99.5, not tachycardic heart rate 95, not tachypneic RR 18, BP 133/74, initially saturating 85% on room air, subsequently placed on 2 L O2 via nasal cannula saturating in the mid 90s.  Patient presenting with hemoptysis, cough and chills with associated shortness of breath.  Differential diagnose includes viral infection, lung mass, bronchitis, aspiration pneumonitis, commune acquired pneumonia, less likely PE.  Laboratory evaluation revealed CBC without a leukocytosis, mild anemia to 11.7, BMP unremarkable, proBNP normal, cardiac troponin normal, COVID flu and RSV PCR testing collected.  The setting the patient's hemoptysis, shortness of breath and acute hypoxic respiratory failure, CTA PE study was ordered and pending at time of signout.  Signout given to Dr. Ruthe at 0 700 follow-up results of diagnostic testing, likely disposition inpatient given the patient's hypoxia.     Final diagnoses:  Acute respiratory failure with hypoxia [facility])  Hemoptysis    ED Discharge Orders     None          Jerrol Agent, MD 08/16/24 9296  "

## 2024-08-16 NOTE — ED Notes (Signed)
 [facility] called to transport the patient to [facility] rm# [phone]

## 2024-08-16 NOTE — Plan of Care (Signed)
" °  Problem: Education: Goal: Knowledge of General Education information will improve Description: Including pain rating scale, medication(s)/side effects and non-pharmacologic comfort measures Outcome: Progressing   Problem: Clinical Measurements: Goal: Will remain free from infection Outcome: Progressing Goal: Diagnostic test results will improve Outcome: Progressing Goal: Respiratory complications will improve Outcome: Progressing   Problem: Elimination: Goal: Will not experience complications related to bowel motility Outcome: Progressing Goal: Will not experience complications related to urinary retention Outcome: Progressing   Problem: Safety: Goal: Ability to remain free from injury will improve Outcome: Progressing   "

## 2024-08-16 NOTE — Consult Note (Signed)
 "  NAME:  Tyrone [name], MRN:  [id number], DOB:  1966-03-02, LOS: 0 ADMISSION DATE:  08/16/2024, CONSULTATION DATE:  08/16/24 REFERRING MD:  Dr Dennise, CHIEF COMPLAINT:  SOB, cough,hemoptysis   History of Present Illness:  Patient being seen for shortness of breath, cough, hemoptysis  Woke up in the early morning hours with cough, mucus production, hemoptysis Did not have symptoms the previous day, was not coughing, was able to work in his yard Woke up in the middle of the night with coughing, fever, mucus with blood in it No frank hemoptysis Has not had any GI symptoms, no dysuria, no urgency  History of atrial fibrillation on Xarelto , ablation in the past, history of stroke in 2013 Past history of obstructive sleep apnea-use CPAP for a while but lost a lot of weight, gastric bypass surgery, nonischemic cardiomyopathy, past history of DM Barr  Evaluation in the ED-CT scan with multifocal infiltrate on the right side  Pertinent  Medical History   Past Medical History:  Diagnosis Date   Anemia, iron deficiency 01/30/2017   Cervicalgia    CVA (cerebral infarction) 05/01/12   on pradaxa  at time of stroke, switched to xarelto    External thrombosed hemorrhoids    GERD (gastroesophageal reflux disease)    Guillain Barr syndrome    following seasonal influenza vaccination   Guillain-Barre syndrome    from stroke in 2013   Hematuria    a. 01/2012 - to f/u with urology as outpt.   Hernia of unspecified site of abdominal cavity without mention of obstruction or gangrene    of abdominal   Hx of gastric bypass 01/30/2017   2004 Dr Hope [facility] weight 450 lbs   Hypertension    Iron malabsorption 01/30/2017   Hx gastric bypass 2004   Morbid obesity s/p gastric bypass    Nonischemic cardiomyopathy (HCC)    a. 01/2012 Echo: EF 35-40%, Diff HK, mild MR;  b. 01/2012 R&L Heart Cath: mildly elevated R heart pressures (pcwp 17) , nl cors.   Osteoarthrosis, unspecified whether generalized  or localized, ankle and foot    of the knees,ankle and foot   Pancytopenia    Persistent atrial fibrillation (HCC)    a. Pradaxa  started 01/2012;  b .s/p TEE/DCCV 02/16/2012; c. recurrent afib->Tikosyn  Initiation 03/2012   Sleep apnea    CPAP compliant   Status post bypass gastrojejunostomy 01/30/2017   Stroke (HCC)    Unspecified intestinal malabsorption    Urticaria, unspecified    Vitamin B 12 deficiency    Significant Hospital Events: Including procedures, antibiotic start and stop dates in addition to other pertinent events   CT chest 08/16/2024-multifocal infiltrate on the right  Interim History / Subjective:  He is feeling little bit better Shortness of breath, no chest pains or chest discomfort  Objective    Blood pressure 126/80, pulse 71, temperature 99 F (37.2 C), resp. rate 18, height 5' 6 (1.676 m), weight 104.3 kg, SpO2 95%.        Intake/Output Summary (Last 24 hours) at 08/16/2024 1655 Last data filed at 08/16/2024 0910 Gross per 24 hour  Intake 87.59 ml  Output --  Net 87.59 ml   Filed Weights   08/16/24 0904 08/16/24 1441  Weight: 104.3 kg 104.3 kg    Examination: General: Middle-age, does not appear to be in distress HENT: Moist oral mucosa Lungs: Clear breath sounds bilaterally Cardiovascular: S1-S2 appreciated Abdomen: Soft, bowel sounds appreciated Extremities: No clubbing, minimal edema Neuro: Awake  alert oriented x 3 GU:   Lab data reviewed CT scan reviewed by myself  WBC 8.3, hemoglobin of 11.7, hematocrit of 34.5  Resolved problem list   Assessment and Plan   Multilobar pneumonia Community-acquired pneumonia - Agree with current antibiotic therapy  Acute hypoxic respiratory failure - Related to above  Hemoptysis - Likely related to pneumonia/bronchitis - Continue to monitor - No frank about this is  Mild pulmonary hypertension - Will repeat echo to follow  Paroxysmal atrial fibrillation - Home meds continued - Can resume  his anticoagulation in 24 hours if no ongoing bleeding  History of diastolic heart failure  Past history of obstructive sleep apnea - His weight is down almost 200 pounds from his heaviest -Last sleep study was in 2019, his BMI at that time was 32.6, current BMI is 37.12, in the context of atrial fibrillation and mild pulmonary hypertension, a repeat sleep study is recommended and treatment for sleep apnea he found to have significant sleep apnea  Repeat echo ordered Will follow  Labs   CBC: Recent Labs  Lab 08/16/24 0618  WBC 8.3  HGB 11.7*  HCT 34.5*  MCV 99.4  PLT 179    Basic Metabolic Panel: Recent Labs  Lab 08/16/24 0618  NA 136  K 4.0  CL 100  CO2 22  GLUCOSE 101*  BUN 15  CREATININE 0.86  CALCIUM  8.7*   GFR: Estimated Creatinine Clearance: 104.7 mL/min (by C-G formula based on SCr of 0.86 mg/dL). Recent Labs  Lab 08/16/24 0610 08/16/24 0618 08/16/24 0822 08/16/24 0917  PROCALCITON  --   --   --  1.84  WBC  --  8.3  --   --   LATICACIDVEN 1.8  --  1.4  --     Liver Function Tests: Recent Labs  Lab 08/16/24 0618  AST 17  ALT 20  ALKPHOS 71  BILITOT 0.6  PROT 6.3*  ALBUMIN 4.1   No results for input(s): LIPASE, AMYLASE in the last 168 hours. No results for input(s): AMMONIA in the last 168 hours.  ABG    Component Value Date/Time   PHART 7.437 02/13/2012 1729   PCO2ART 36.9 02/13/2012 1729   PO2ART 65.0 (L) 02/13/2012 1729   HCO3 24.9 (H) 02/13/2012 1729   TCO2 22 03/07/2020 1504   O2SAT 93.0 02/13/2012 1729     Coagulation Profile: No results for input(s): INR, PROTIME in the last 168 hours.  Cardiac Enzymes: No results for input(s): CKTOTAL, CKMB, CKMBINDEX, TROPONINI in the last 168 hours.  HbA1C: Hgb A1c MFr Bld  Date/Time Value Ref Range Status  05/01/2012 01:01 AM 5.6 <5.7 % Final    Comment:    (NOTE)                                                                       According to the ADA Clinical  Practice Recommendations for 2011, when HbA1c is used as a screening test:  >=6.5%   Diagnostic of Diabetes Mellitus           (if abnormal result is confirmed) 5.7-6.4%   Increased risk of developing Diabetes Mellitus References:Diagnosis and Classification of Diabetes Mellitus,Diabetes Care,2011,34(Suppl 1):S62-S69 and Standards of Medical Care in  Diabetes - 2011,Diabetes Care,2011,34 (Suppl 1):S11-S61.    CBG: No results for input(s): GLUCAP in the last 168 hours.  Review of Systems:   Shortness of breath, cough  Past Medical History:  He,  has a past medical history of Anemia, iron deficiency (01/30/2017), Cervicalgia, CVA (cerebral infarction) (05/01/12), External thrombosed hemorrhoids, GERD (gastroesophageal reflux disease), Guillain Barr syndrome, Guillain-Barre syndrome, Hematuria, Hernia of unspecified site of abdominal cavity without mention of obstruction or gangrene, gastric bypass (01/30/2017), Hypertension, Iron malabsorption (01/30/2017), Morbid obesity s/p gastric bypass, Nonischemic cardiomyopathy (HCC), Osteoarthrosis, unspecified whether generalized or localized, ankle and foot, Pancytopenia, Persistent atrial fibrillation (HCC), Sleep apnea, Status post bypass gastrojejunostomy (01/30/2017), Stroke (HCC), Unspecified intestinal malabsorption, Urticaria, unspecified, and Vitamin B 12 deficiency.   Surgical History:   Past Surgical History:  Procedure Laterality Date   ATRIAL FIBRILLATION ABLATION N/A 06/25/2012   PVI by Dr Kelsie   ATRIAL FIBRILLATION ABLATION N/A 10/04/2017   Procedure: ATRIAL FIBRILLATION ABLATION;  Surgeon: Kelsie Agent, MD;  Location: [facility] INVASIVE CV LAB;  Service: Cardiovascular;  Laterality: N/A;   CARDIOVERSION  02/16/2012   Procedure: CARDIOVERSION;  Surgeon: Redell GORMAN Shallow, MD;  Location: [facility] ENDOSCOPY;  Service: Cardiovascular;  Laterality: N/A;   CARDIOVERSION  04/22/2012   Procedure: CARDIOVERSION;  Surgeon: Elspeth JAYSON Sage, MD;   Location[facility] ENDOSCOPY;  Service: Cardiovascular;  Laterality: N/A;   CARDIOVERSION N/A 08/22/2017   Procedure: CARDIOVERSION;  Surgeon: Raford Riggs, MD;  Location: [facility] ENDOSCOPY;  Service: Cardiovascular;  Laterality: N/A;   CARDIOVERSION N/A 06/11/2018   Procedure: CARDIOVERSION;  Surgeon: Alveta Aleene PARAS, MD;  Location: [facility] ENDOSCOPY;  Service: Cardiovascular;  Laterality: N/A;   CARDIOVERSION N/A 04/29/2019   Procedure: CARDIOVERSION;  Surgeon: Barbaraann Darryle Ned, MD;  Location: [facility] ENDOSCOPY;  Service: Endoscopy;  Laterality: N/A;   CARDIOVERSION N/A 06/18/2019   Procedure: CARDIOVERSION;  Surgeon: Raford Riggs, MD;  Location: [facility] ENDOSCOPY;  Service: Cardiovascular;  Laterality: N/A;   COLONOSCOPY N/A 07/07/2016   Procedure: COLONOSCOPY;  Surgeon: Belvie Just, MD;  Location: [facility] ENDOSCOPY;  Service: Endoscopy;  Laterality: N/A;   ESOPHAGOGASTRODUODENOSCOPY N/A 07/07/2016   Procedure: ESOPHAGOGASTRODUODENOSCOPY (EGD);  Surgeon: Belvie Just, MD;  Location: [facility] ENDOSCOPY;  Service: Endoscopy;  Laterality: N/A;   GASTRIC BYPASS  2004   HERNIA REPAIR     LEFT AND RIGHT HEART CATHETERIZATION WITH CORONARY ANGIOGRAM N/A 02/13/2012   Procedure: LEFT AND RIGHT HEART CATHETERIZATION WITH CORONARY ANGIOGRAM;  Surgeon: Ozell Fell, MD;  Location: [facility] CATH LAB;  Service: Cardiovascular;  Laterality: N/A;   TEE WITHOUT CARDIOVERSION  02/16/2012   Procedure: TRANSESOPHAGEAL ECHOCARDIOGRAM (TEE);  Surgeon: Redell GORMAN Shallow, MD;  Location: [facility] ENDOSCOPY;  Service: Cardiovascular;  Laterality: N/A;  10a CV   TEE WITHOUT CARDIOVERSION  06/24/2012   Procedure: TRANSESOPHAGEAL ECHOCARDIOGRAM (TEE);  Surgeon: Peter M Jordan, MD;  Location: [facility] ENDOSCOPY;  Service: Cardiovascular;  Laterality: N/A;   TEE WITHOUT CARDIOVERSION N/A 08/22/2017   Procedure: TRANSESOPHAGEAL ECHOCARDIOGRAM (TEE);  Surgeon: Raford Riggs, MD;  Location: [facility] ENDOSCOPY;  Service: Cardiovascular;  Laterality: N/A;   TEE WITHOUT  CARDIOVERSION N/A 06/11/2018   Procedure: TRANSESOPHAGEAL ECHOCARDIOGRAM (TEE);  Surgeon: Alveta Aleene PARAS, MD;  Location: [facility] ENDOSCOPY;  Service: Cardiovascular;  Laterality: N/A;   UMBILICAL HERNIA REPAIR     x 2     Social History:   reports that he has never smoked. He has been exposed to tobacco smoke. He quit smokeless tobacco use about 21 years ago.  His smokeless tobacco use included chew. He reports  current alcohol use of about 2.0 standard drinks of alcohol per week. He reports that he does not use drugs.   Family History:  His family history includes Diabetes in his father; Heart attack (age of onset: 65) in his father; Hypertension in his father and mother; Kidney disease in his father; Stroke in his paternal grandmother.   Allergies Allergies[1]   Jennet Epley, MD [place] [facility] Pager: See Amion         [1] No Known Allergies  "

## 2024-08-16 NOTE — ED Triage Notes (Signed)
 Pt reports lower back pain right around the belt line, a dull ache since before he went to bed- he put biofreeze on with some relief. 4 hours ago he had onset of chills and cough. Pt reports that he is coughing up bright red blood. He is on xarelto . SOB while lying flat. 84-87% RA during triage.

## 2024-08-16 NOTE — H&P (Addendum)
 "                                                                                                                                                                                                                                                                                                                                                                                                                                                      Tele-Hospitalist H&P Note    Patient Demographics:   Tyrone [name], is a 59 y.o. male  MRN: [id number]   DOB - 07/28/1965  Referring Provider: EDP Telemedicine Provider: Lavada Stank M.D   Patient Location: [facility] Referring Diagnosis: Cough and hemoptysis   Patient Name and DOB verified: Tyrone [name],  DOB - 1966-06-24    Patient consented to Telemedicine Evaluation:Yes  RN virtual assistant: Okey Clamp   Video encounter time and date: 08/16/2024 at 9:09 AM    Location of the provider [facility]   HPI:    Tyrone [name]  is a 59 y.o. male, with known past medical history of atrial fibrillation on Xarelto  s/p ablation follows with Dr. Cleotilde, stroke in 2013, OSA does not use CPAP anymore in 4 yrs, remote history of Guillain-Barr syndrome, gastric bypass surgery, nonischemic cardiomyopathy.  Patient with above history who was in good state of health until last evening when he went  to bed and then started experiencing a cough, he had multiple episodes of cough followed by intense chills subjective fevers and some low back pain, subsequently he had some blood in his sputum it was sputum mixed with blood and not frank blood clots.  This went on overnight and he subsequently came to [facility].  In the ER patient underwent CT scan of the chest showing multifocal right-sided pneumonia, he was diagnosed with mild hemoptysis, he was accepted for admission by one of my partners.  Currently patient is feeling somewhat better, he denies any present headache, no  fever or chills at this time, he had mild shortness of breath which is much improved after he received 2 L of nasal cannula oxygen, he has no chest pain, no abdominal pain, his low back pain is much improved, it is dull, nonradiating not associated with any other symptoms, no blood in stool or urine, no diarrhea or dysuria, no lower extremity weakness, no bowel or bladder incontinence.    He is being admitted for community-acquired pneumonia with mild hemoptysis in the presence of Xarelto  use.    Review of systems:     A full 10 point Review of Systems was done, except as stated above, all other Review of Systems were negative.   With Past History of the following :    Past Medical History:  Diagnosis Date   Anemia, iron deficiency 01/30/2017   Cervicalgia    CVA (cerebral infarction) 05/01/12   on pradaxa  at time of stroke, switched to xarelto    External thrombosed hemorrhoids    GERD (gastroesophageal reflux disease)    Guillain Barr syndrome    following seasonal influenza vaccination   Guillain-Barre syndrome    from stroke in 2013   Hematuria    a. 01/2012 - to f/u with urology as outpt.   Hernia of unspecified site of abdominal cavity without mention of obstruction or gangrene    of abdominal   Hx of gastric bypass 01/30/2017   2004 [facility] weight 450 lbs   Hypertension    Iron malabsorption 01/30/2017   Hx gastric bypass 2004   Morbid obesity s/p gastric bypass    Nonischemic cardiomyopathy (HCC)    a. 01/2012 Echo: EF 35-40%, Diff HK, mild MR;  b. 01/2012 R&L Heart Cath: mildly elevated R heart pressures (pcwp 17) , nl cors.   Osteoarthrosis, unspecified whether generalized or localized, ankle and foot    of the knees,ankle and foot   Pancytopenia    Persistent atrial fibrillation (HCC)    a. Pradaxa  started 01/2012;  b .s/p TEE/DCCV 02/16/2012; c. recurrent afib->Tikosyn  Initiation 03/2012   Sleep apnea    CPAP compliant   Status post bypass  gastrojejunostomy 01/30/2017   Stroke (HCC)    Unspecified intestinal malabsorption    Urticaria, unspecified    Vitamin B 12 deficiency       Past Surgical History:  Procedure Laterality Date   ATRIAL FIBRILLATION ABLATION N/A 06/25/2012   PVI by Dr Kelsie   ATRIAL FIBRILLATION ABLATION N/A 10/04/2017   Procedure: ATRIAL FIBRILLATION ABLATION;  Surgeon: Kelsie Agent, MD;  Location: [facility] INVASIVE CV LAB;  Service: Cardiovascular;  Laterality: N/A;   CARDIOVERSION  02/16/2012   Procedure: CARDIOVERSION;  Surgeon: Redell GORMAN Shallow, MD;  Location: [facility] ENDOSCOPY;  Service: Cardiovascular;  Laterality: N/A;   CARDIOVERSION  04/22/2012   Procedure: CARDIOVERSION;  Surgeon: Elspeth JAYSON Sage, MD;  Location: [facility] ENDOSCOPY;  Service: Cardiovascular;  Laterality: N/A;  CARDIOVERSION N/A 08/22/2017   Procedure: CARDIOVERSION;  Surgeon: Raford Riggs, MD;  Location: [facility] ENDOSCOPY;  Service: Cardiovascular;  Laterality: N/A;   CARDIOVERSION N/A 06/11/2018   Procedure: CARDIOVERSION;  Surgeon: Alveta Aleene PARAS, MD;  Location: [facility] ENDOSCOPY;  Service: Cardiovascular;  Laterality: N/A;   CARDIOVERSION N/A 04/29/2019   Procedure: CARDIOVERSION;  Surgeon: Barbaraann Darryle Ned, MD;  Location: [facility] ENDOSCOPY;  Service: Endoscopy;  Laterality: N/A;   CARDIOVERSION N/A 06/18/2019   Procedure: CARDIOVERSION;  Surgeon: Raford Riggs, MD;  Location: [facility] ENDOSCOPY;  Service: Cardiovascular;  Laterality: N/A;   COLONOSCOPY N/A 07/07/2016   Procedure: COLONOSCOPY;  Surgeon: Belvie Just, MD;  Location: [facility] ENDOSCOPY;  Service: Endoscopy;  Laterality: N/A;   ESOPHAGOGASTRODUODENOSCOPY N/A 07/07/2016   Procedure: ESOPHAGOGASTRODUODENOSCOPY (EGD);  Surgeon: Belvie Just, MD;  Location: [facility] ENDOSCOPY;  Service: Endoscopy;  Laterality: N/A;   GASTRIC BYPASS  2004   HERNIA REPAIR     LEFT AND RIGHT HEART CATHETERIZATION WITH CORONARY ANGIOGRAM N/A 02/13/2012   Procedure: LEFT AND RIGHT HEART CATHETERIZATION WITH CORONARY  ANGIOGRAM;  Surgeon: Ozell Fell, MD;  Location: [facility] CATH LAB;  Service: Cardiovascular;  Laterality: N/A;   TEE WITHOUT CARDIOVERSION  02/16/2012   Procedure: TRANSESOPHAGEAL ECHOCARDIOGRAM (TEE);  Surgeon: Redell GORMAN Shallow, MD;  Location: [facility] ENDOSCOPY;  Service: Cardiovascular;  Laterality: N/A;  10a CV   TEE WITHOUT CARDIOVERSION  06/24/2012   Procedure: TRANSESOPHAGEAL ECHOCARDIOGRAM (TEE);  Surgeon: Peter M Jordan, MD;  Location: [facility] ENDOSCOPY;  Service: Cardiovascular;  Laterality: N/A;   TEE WITHOUT CARDIOVERSION N/A 08/22/2017   Procedure: TRANSESOPHAGEAL ECHOCARDIOGRAM (TEE);  Surgeon: Raford Riggs, MD;  Location: [facility] ENDOSCOPY;  Service: Cardiovascular;  Laterality: N/A;   TEE WITHOUT CARDIOVERSION N/A 06/11/2018   Procedure: TRANSESOPHAGEAL ECHOCARDIOGRAM (TEE);  Surgeon: Alveta Aleene PARAS, MD;  Location: [facility] ENDOSCOPY;  Service: Cardiovascular;  Laterality: N/A;   UMBILICAL HERNIA REPAIR     x 2      Social History:     Social History   Tobacco Use   Smoking status: Never    Passive exposure: Past   Smokeless tobacco: Former    Types: Chew    Quit date: 11/11/2002  Substance Use Topics   Alcohol use: Yes    Alcohol/week: 2.0 standard drinks of alcohol    Types: 2 Standard drinks or equivalent per week    Comment: weekends        Family History :     Family History  Problem Relation Age of Onset   Hypertension Mother    Hypertension Father    Heart attack Father 73       Died   Diabetes Father    Kidney disease Father        with transplant   Stroke Paternal Grandmother       Home Medications:   Prior to Admission medications  Medication Sig Start Date End Date Taking? Authorizing Provider  amLODipine  (NORVASC ) 5 MG tablet Take 1 tablet by mouth once daily 12/04/23  Yes Mealor, Augustus E, MD  ferrous sulfate 325 (65 FE) MG tablet Take 325 mg by mouth daily.    Yes [provider]  flecainide  (TAMBOCOR ) 50 MG tablet Take 1 tablet by mouth  twice daily 01/09/24  Yes Ursuy, Renee Lynn, PA-C  folic acid  (FOLVITE ) 1 MG tablet Take 1 tablet by mouth daily.   Yes [provider]  furosemide  (LASIX ) 40 MG tablet Take 1/2 (one-half) tablet by mouth once daily 12/04/23  Yes Mealor, Augustus E, MD  metoprolol  tartrate (LOPRESSOR ) 25 MG tablet Take 1/2 (one-half) tablet by mouth twice daily 12/04/23  Yes Mealor, Augustus E, MD  Multiple Vitamin (MULTIVITAMIN) tablet Take 1-2 tablets by mouth See admin instructions. Take 2 tablets in the morning and 1 tablet in the evening   Yes [provider]  omeprazole (PRILOSEC) 40 MG capsule Take 40 mg by mouth daily.  09/02/16  Yes [provider]  rivaroxaban  (XARELTO ) 20 MG TABS tablet TAKE 1 TABLET BY MOUTH ONCE DAILY WITH SUPPER 03/25/24  Yes Mealor, Augustus E, MD  sildenafil (VIAGRA) 50 MG tablet Take 50-100 mg by mouth daily as needed. 03/29/23  Yes [provider]  spironolactone  (ALDACTONE ) 25 MG tablet Take 1 tablet by mouth once daily 12/04/23  Yes Mealor, Augustus E, MD  vitamin E  180 MG (400 UNITS) capsule Take 400 Units by mouth daily.   Yes [provider]     Allergies:    Allergies[1]   Physical Exam: Assisted by the nurse virtually over camera   Vitals  Blood pressure 110/64, pulse 73, temperature 99.5 F (37.5 C), temperature source Oral, resp. rate 16, height 5' 6 (1.676 m), weight 104.3 kg, SpO2 96%.   Virtual exam done over Video assisted by RN  1. General Middle-aged Caucasian male lying in hospital bed in no apparent distress  2. Normal affect and insight, Not Suicidal or Homicidal, Awake Alert,   3. No F.N deficits, Strength & Sensation equally intact all 4 extremities ,    4. Ears and Eyes appear Normal, Conjunctivae clear,    5. Supple Neck,    6. Symmetrical Chest wall movement, Good air movement bilaterally, few bibasilar crackles  7. RRR, No Murmurs,   8. Positive Bowel Sounds, Abdomen Soft, No tenderness,    9. No  Skin Rash   10. Normal ROM.     Data Review:   Recent Labs  Lab 08/16/24 0618  WBC 8.3  HGB 11.7*  HCT 34.5*  PLT 179  MCV 99.4  MCH 33.7  MCHC 33.9  RDW 13.3    Recent Labs  Lab 08/16/24 0610 08/16/24 0618 08/16/24 0822  NA  --  136  --   K  --  4.0  --   CL  --  100  --   CO2  --  22  --   ANIONGAP  --  15  --   GLUCOSE  --  101*  --   BUN  --  15  --   CREATININE  --  0.86  --   AST  --  17  --   ALT  --  20  --   ALKPHOS  --  71  --   BILITOT  --  0.6  --   ALBUMIN  --  4.1  --   LATICACIDVEN 1.8  --  1.4  CALCIUM   --  8.7*  --     Lab Results  Component Value Date   CHOL 165 06/15/2015   HDL 85 06/15/2015   LDLCALC 71 06/15/2015   TRIG 46 06/15/2015   CHOLHDL 1.9 06/15/2015    Recent Labs  Lab 08/16/24 0610 08/16/24 0618 08/16/24 0822  LATICACIDVEN 1.8  --  1.4  CALCIUM   --  8.7*  --     Recent Labs  Lab 08/16/24 0610 08/16/24 0618 08/16/24 0822  WBC  --  8.3  --   PLT  --  179  --   LATICACIDVEN 1.8  --  1.4  CREATININE  --  0.86  --     Urinalysis    Component Value Date/Time   COLORURINE YELLOW 08/16/2024 0841   APPEARANCEUR CLEAR 08/16/2024 0841   LABSPEC >1.046 (H) 08/16/2024 0841   PHURINE 6.0 08/16/2024 0841   GLUCOSEU NEGATIVE 08/16/2024 0841   HGBUR NEGATIVE 08/16/2024 0841   BILIRUBINUR NEGATIVE 08/16/2024 0841   KETONESUR 15 (A) 08/16/2024 0841   PROTEINUR NEGATIVE 08/16/2024 0841   UROBILINOGEN 0.2 02/11/2012 1255   NITRITE NEGATIVE 08/16/2024 0841   LEUKOCYTESUR NEGATIVE 08/16/2024 0841      Imaging Results:    CT Angio Chest PE W and/or Wo Contrast Result Date: 08/16/2024 CLINICAL DATA:  Cough and chills.  Shortness of breath. EXAM: CT ANGIOGRAPHY CHEST WITH CONTRAST TECHNIQUE: Multidetector CT imaging of the chest was performed using the standard protocol during bolus administration of intravenous contrast. Multiplanar CT image reconstructions and MIPs were obtained to evaluate the vascular anatomy.  RADIATION DOSE REDUCTION: This exam was performed according to the departmental dose-optimization program which includes automated exposure control, adjustment of the mA and/or kV according to patient size and/or use of iterative reconstruction technique. CONTRAST:  75mL OMNIPAQUE  IOHEXOL  350 MG/ML SOLN COMPARISON:  None Available. FINDINGS: Cardiovascular: The heart size is normal. No substantial pericardial effusion. Coronary artery calcification is evident. Mild atherosclerotic calcification is noted in the wall of the thoracic aorta. Enlargement of the pulmonary outflow tract/main pulmonary arteries suggests pulmonary arterial hypertension. There is no filling defect within the opacified pulmonary arteries to suggest the presence of an acute pulmonary embolus. Mediastinum/Nodes: No mediastinal lymphadenopathy. There is no hilar lymphadenopathy. Small hiatal hernia with surgical changes noted in the proximal stomach. There is no axillary lymphadenopathy. Lungs/Pleura: Diffuse nodular airspace disease is seen in all 3 lobes of the right lung, most advanced in the lower right middle and lower lobes with there is associated interlobular septal thickening. Left lung is clear. No pleural effusion. Upper Abdomen: Surgical changes in the stomach compatible with gastric bypass surgery. Musculoskeletal: No worrisome lytic or sclerotic osseous abnormality. Review of the MIP images confirms the above findings. IMPRESSION: 1. No CT evidence for acute pulmonary embolus. 2. Diffuse nodular and confluent airspace disease in all 3 lobes of the right lung, most advanced in the lower right middle and lower lobes with associated interlobular septal thickening. Imaging features are most suggestive of multifocal pneumonia. 3. Enlargement of the pulmonary outflow tract/main pulmonary arteries suggests pulmonary arterial hypertension. 4. Small hiatal hernia with surgical changes in the proximal stomach compatible with gastric bypass.  5.  Aortic Atherosclerosis (ICD10-I70.0). Electronically Signed   By: Camellia Candle M.D.   On: 08/16/2024 07:40    My personal review of EKG: Rhythm NSR, RBBB, no Acute ST changes   Assessment & Plan:    1.  Community-acquired pneumonia with mild hemoptysis and acute respiratory failure hypoxic.  He will be admitted to a telemetry bed, supplemental oxygen, panculture including extended respiratory viral panel, Legionella and strep urinary antigen, empiric IV antibiotics, flutter valve and incentive spirometer for pulmonary toiletry, speech eval to rule out aspiration.  Have also requested pulmonary physician Dr. Milagros to see the patient upon arrival to [facility].  For now his Xarelto  will be held.   2.  Paroxysmal atrial fibrillation with Chad vas 2 score of greater than 4.  Status post ablation.  Continue his home medications which include Tikosyn , beta-blocker, Xarelto  on hold due to hemoptysis.  Kindly readdress in the morning.  Telemetry monitor.  Echo from 1 year ago noted with EF of 55%.  3.  History of hypertension.  For now continue beta-blocker, Norvasc  held as blood pressure borderline.  4.  History of chronic diastolic CHF EF 55% 1 year ago.  Currently appears to be compensated, BNP slightly elevated likely due to the stress of pneumonia, gentle hydration with caution, home diuretics held for today.  Readdress in the morning.  5.  History of Guillain-Barr syndrome.  Completely resolved several years ago.  6.  History of stroke in 2013.  Stable no acute issues, readdress Xarelto  in the morning.  7.  History of gastric bypass surgery.  Stable no acute issues  8.  Dull low back pain since last night, currently stable and improved, likely due to viral illness and multiple interruptions in sleep due to cough and frequent getting up, no radiating pain, no associated symptoms, supportive care and monitor.     DVT Prophylaxis  SCDs   AM Labs Ordered, also please review  Full Orders  Family Communication: Admission, patients condition and plan of care including tests being ordered have been discussed with the patient and his wife who indicate understanding and agree with the plan and Code Status.  Code Status Full  Likely DC to  TBD  Condition GUARDED    Consults called: PCCM Dr Milagros    Admission status: Inpt    Time spent in minutes : 45  Signature  -    Lavada Stank M.D on 08/16/2024 at 9:09 AM   -  To page go to [email or web address]              [1] No Known Allergies  "

## 2024-08-17 ENCOUNTER — Inpatient Hospital Stay (HOSPITAL_COMMUNITY)

## 2024-08-17 DIAGNOSIS — I480 Paroxysmal atrial fibrillation: Secondary | ICD-10-CM | POA: Diagnosis not present

## 2024-08-17 DIAGNOSIS — J13 Pneumonia due to Streptococcus pneumoniae: Secondary | ICD-10-CM

## 2024-08-17 DIAGNOSIS — J9601 Acute respiratory failure with hypoxia: Secondary | ICD-10-CM

## 2024-08-17 DIAGNOSIS — I272 Pulmonary hypertension, unspecified: Secondary | ICD-10-CM | POA: Diagnosis not present

## 2024-08-17 DIAGNOSIS — Z87898 Personal history of other specified conditions: Secondary | ICD-10-CM

## 2024-08-17 DIAGNOSIS — J188 Other pneumonia, unspecified organism: Secondary | ICD-10-CM | POA: Diagnosis not present

## 2024-08-17 DIAGNOSIS — R042 Hemoptysis: Secondary | ICD-10-CM

## 2024-08-17 LAB — COMPREHENSIVE METABOLIC PANEL WITH GFR
ALT: 16 U/L (ref 0–44)
AST: 18 U/L (ref 15–41)
Albumin: 3.5 g/dL (ref 3.5–5.0)
Alkaline Phosphatase: 63 U/L (ref 38–126)
Anion gap: 8 (ref 5–15)
BUN: 12 mg/dL (ref 6–20)
CO2: 26 mmol/L (ref 22–32)
Calcium: 8.9 mg/dL (ref 8.9–10.3)
Chloride: 102 mmol/L (ref 98–111)
Creatinine, Ser: 0.89 mg/dL (ref 0.61–1.24)
GFR, Estimated: >60 mL/min
Glucose, Bld: 109 mg/dL — ABNORMAL HIGH (ref 70–99)
Potassium: 4 mmol/L (ref 3.5–5.1)
Sodium: 136 mmol/L (ref 135–145)
Total Bilirubin: 0.9 mg/dL (ref 0.0–1.2)
Total Protein: 5.8 g/dL — ABNORMAL LOW (ref 6.5–8.1)

## 2024-08-17 LAB — CBC
HCT: 33.5 % — ABNORMAL LOW (ref 39.0–52.0)
Hemoglobin: 11.2 g/dL — ABNORMAL LOW (ref 13.0–17.0)
MCH: 33.8 pg (ref 26.0–34.0)
MCHC: 33.4 g/dL (ref 30.0–36.0)
MCV: 101.2 fL — ABNORMAL HIGH (ref 80.0–100.0)
Platelets: 106 10*3/uL — ABNORMAL LOW (ref 150–400)
RBC: 3.31 MIL/uL — ABNORMAL LOW (ref 4.22–5.81)
RDW: 13.6 % (ref 11.5–15.5)
WBC: 12.7 10*3/uL — ABNORMAL HIGH (ref 4.0–10.5)
nRBC: 0 % (ref 0.0–0.2)

## 2024-08-17 LAB — ECHOCARDIOGRAM COMPLETE
AR max vel: 1.94 cm2
AV Area VTI: 2.11 cm2
AV Area mean vel: 1.9 cm2
AV Mean grad: 10.8 mmHg
AV Peak grad: 19.5 mmHg
Ao pk vel: 2.21 m/s
Area-P 1/2: 2.47 cm2
Calc EF: 61.5 %
Height: 66 in
S' Lateral: 3.1 cm
Single Plane A2C EF: 64.4 %
Single Plane A4C EF: 58.5 %
Weight: 3680 [oz_av]

## 2024-08-17 MED ORDER — LACTATED RINGERS IV SOLN: INTRAVENOUS | Status: DC

## 2024-08-17 MED ORDER — RIVAROXABAN 20 MG PO TABS
20.0000 mg | ORAL_TABLET | Freq: Every day | ORAL | Status: DC
  Administered 2024-08-17: 20 mg via ORAL
  Filled 2024-08-17: qty 1

## 2024-08-17 NOTE — Progress Notes (Signed)
 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   He was placed on oxygen at 2.5 L currently. CT chest No CT evidence for acute pulmonary embolus.Diffuse nodular and confluent airspace disease in all 3 lobes of the right lung, most advanced in the lower right middle and lower lobes with associated interlobular septal thickening. Imaging features are most suggestive of multifocal pneumonia. Enlargement of the pulmonary outflow tract/main pulmonary arteries suggests pulmonary arterial hypertension. Small hiatal hernia with surgical changes in the proximal stomach compatible with gastric bypass.Aortic Atherosclerosis   Assessment & Plan:   Principal Problem:   Acute respiratory failure with hypoxia (HCC)    #1 acute hypoxic respiratory failure due to community-acquired pneumonia patient admitted with mild hemoptysis subjective fever chills and cough and shortness of breath.  CT with evidence of right-sided consolidation.  He was started on Rocephin  and azithromycin .  Xarelto  was on hold initially due to hemoptysis.  Overnight patient did not have any further episodes of hemoptysis.  And urine Legionella positive.  Continue Rocephin  DC azithromycin  and restart Xarelto  and follow closely. Patient was on 3 L of oxygen overnight Ketones in urine continue ivf  #2  History of persistent atrial fibrillation and history of stroke with dilated left atrium.  Echo from a year ago noted.  Repeat echo has been ordered this admission.  Continue Xarelto .  His rate is controlled on Tikosyn  and  beta-blocker.   #3 history of diastolic heart failure with a EF of 55% and pulmonary artery hypertension.  Patient still with soft blood pressure on IV fluids.  Will hold restarting his home diuretics today.   #4 striae of Guillain-Barr syndrome and stroke continue Xarelto   #5 history of gastric bypass surgery stable  6.  History of sleep apnea stopped using CPAP will need outpatient sleep study.   #7 history of essential hypertension on Norvasc  at home currently on hold due to normal to soft blood pressure already on beta-blocker.  Monitor closely.  #9 history of stroke in 2013 while he was on Pradaxa  Continue Xarelto  Follow-up echocardiogram  Estimated body mass index is 37.12 kg/m as calculated from the following:   Height as of this encounter: 5' 6 (1.676 m).   Weight as of this encounter: 104.3 kg.  DVT prophylaxis: xarelto  Code Status: full Family Communication:wife at  bedside Disposition Plan:  Status is: Inpatient   Consultants: pccm  Procedures: none Antimicrobials:rocephin  Subjective: He seems to be bringing up more phlegm but there is no blood in the phlegm anymore breathing seems to have improved still on 2.5 to 3 L of oxygen   Objective: Vitals:   08/16/24 1448 08/16/24 1834 08/16/24 2157 08/17/24 0245  BP: 126/80 132/75 114/69 96/61  Pulse: 71 67 70 (!) 58  Resp: 18 20  20   Temp: 99 F (37.2 C) 98.8 F (37.1 C) 98.9 F (37.2 C) 98.3 F (36.8 C)  TempSrc:   Oral Oral  SpO2: 95%  98% 100%  Weight:      Height:        Intake/Output Summary (Last 24 hours) at 08/17/2024 0827 Last data filed at  08/16/2024 1816 Gross per 24 hour  Intake 207.59 ml  Output --  Net 207.59 ml   Filed Weights   08/16/24 0904 08/16/24 1441  Weight: 104.3 kg 104.3 kg    Examination:  General exam: Appears calm and comfortable  Respiratory system: Rhonchi to the right respiratory effort normal. Cardiovascular system: Irregular. Gastrointestinal system: Abdomen is  nondistended, soft and nontender. No organomegaly or masses felt. Normal bowel sounds heard. Central nervous system: Alert and oriented. No focal neurological deficits. Extremities: No edema  Data Reviewed: I have personally reviewed following labs and imaging studies  CBC: Recent Labs  Lab 08/16/24 0618 08/17/24 0553  WBC 8.3 12.7*  HGB 11.7* 11.2*  HCT 34.5* 33.5*  MCV 99.4 101.2*  PLT 179 106*   Basic Metabolic Panel: Recent Labs  Lab 08/16/24 0618 08/17/24 0553  NA 136 136  K 4.0 4.0  CL 100 102  CO2 22 26  GLUCOSE 101* 109*  BUN 15 12  CREATININE 0.86 0.89  CALCIUM  8.7* 8.9   GFR: Estimated Creatinine Clearance: 101.1 mL/min (by C-G formula based on SCr of 0.89 mg/dL). Liver Function Tests: Recent Labs  Lab 08/16/24 0618 08/17/24 0553  AST 17 18  ALT 20 16  ALKPHOS 71 63  BILITOT 0.6 0.9  PROT 6.3* 5.8*  ALBUMIN 4.1 3.5   No results for input(s): LIPASE, AMYLASE in the last 168 hours. No results for input(s): AMMONIA in the last 168 hours. Coagulation Profile: No results for input(s): INR, PROTIME in the last 168 hours. Cardiac Enzymes: No results for input(s): CKTOTAL, CKMB, CKMBINDEX, TROPONINI in the last 168 hours. BNP (last 3 results) Recent Labs    08/16/24 0618  PROBNP 446.0*   HbA1C: No results for input(s): HGBA1C in the last 72 hours. CBG: No results for input(s): GLUCAP in the last 168 hours. Lipid Profile: No results for input(s): CHOL, HDL, LDLCALC, TRIG, CHOLHDL, LDLDIRECT in the last 72 hours. Thyroid  Function Tests: No results for input(s): TSH, T4TOTAL, FREET4, T3FREE, THYROIDAB in the last 72 hours. Anemia Panel: No results for input(s): VITAMINB12, FOLATE, FERRITIN, TIBC, IRON, RETICCTPCT in the last 72 hours. Sepsis Labs: Recent Labs  Lab 08/16/24 0610 08/16/24 0822 08/16/24 0917  PROCALCITON  --   --  1.84  LATICACIDVEN 1.8 1.4  --     Recent Results (from the  past 240 hours)  Resp panel by RT-PCR (RSV, Flu A&B, Covid) Anterior Nasal Swab     Status: Abnormal   Collection Time: 08/16/24  6:18 AM   Specimen: Anterior Nasal Swab  Result Value Ref Range Status   SARS Coronavirus 2 by RT PCR (A) NEGATIVE Final    INVALID, UNABLE TO DETERMINE THE PRESENCE OF TARGET DUE TO SPECIMEN INTEGRITY. RECOLLECTION REQUESTED.    Comment: DANIEL G 08/16/24 AT 0719 HS (NOTE) Presence or absence of SARS-COV-2, Flu A, Flu B and/or RSV viral target RNA cannot be determined. Repeat testing is recommended. The expected result is Negative.  Fact Sheet for Patients:  bloggercourse.com  Fact Sheet for Healthcare Providers: seriousbroker.it  This test is not yet approved or cleared by the [place]  FDA and  has been authorized for detection and/or diagnosis of SARS-CoV-2 by FDA under an Emergency Use Authorization (EUA). This EUA will remain  in effect (meaning this test can be used) for the duration of the COVID-19 declaration under Section 564(b)(1) of the Act, 21 U.S.C. section 360bbb-3(b)(1), unless the authorization is terminated or revoked.     Influenza A by  PCR (A) NEGATIVE Final    INVALID, UNABLE TO DETERMINE THE PRESENCE OF TARGET DUE TO SPECIMEN INTEGRITY. RECOLLECTION REQUESTED.   Influenza B by PCR (A) NEGATIVE Final    INVALID, UNABLE TO DETERMINE THE PRESENCE OF TARGET DUE TO SPECIMEN INTEGRITY. RECOLLECTION REQUESTED.    Comment: (NOTE) The Xpert Xpress SARS-CoV-2/FLU/RSV plus assay is intended as an aid in the diagnosis of influenza from Nasopharyngeal swab specimens and should not be used as a sole basis for treatment. Nasal washings and aspirates are unacceptable for Xpert Xpress SARS-CoV-2/FLU/RSV testing.  Fact Sheet for Patients: bloggercourse.com  Fact Sheet for Healthcare Providers: seriousbroker.it  This test is not yet approved  or cleared by the [place]  FDA and has been authorized for detection and/or diagnosis of SARS-CoV-2 by FDA under an Emergency Use Authorization (EUA). This EUA will remain in effect (meaning this test can be used) for the duration of the COVID-19 declaration under Section 564(b)(1) of the Act, 21 U.S.C. section 360bbb-3(b)(1), unless the authorization is terminated or revoked.     Resp Syncytial Virus by PCR (A) NEGATIVE Final    INVALID, UNABLE TO DETERMINE THE PRESENCE OF TARGET DUE TO SPECIMEN INTEGRITY. RECOLLECTION REQUESTED.    Comment: (NOTE) Fact Sheet for Patients: bloggercourse.com  Fact Sheet for Healthcare Providers: seriousbroker.it  This test is not yet approved or cleared by the [place]  FDA and has been authorized for detection and/or diagnosis of SARS-CoV-2 by FDA under an Emergency Use Authorization (EUA). This EUA will remain in effect (meaning this test can be used) for the duration of the COVID-19 declaration under Section 564(b)(1) of the Act, 21 U.S.C. section 360bbb-3(b)(1), unless the authorization is terminated or revoked.  Performed at [facility], [address] [place], [place] [address]   Resp panel by RT-PCR (RSV, Flu A&B, Covid) Anterior Nasal Swab     Status: None   Collection Time: 08/16/24  7:24 AM   Specimen: Anterior Nasal Swab  Result Value Ref Range Status   SARS Coronavirus 2 by RT PCR NEGATIVE NEGATIVE Final    Comment: (NOTE) SARS-CoV-2 target nucleic acids are NOT DETECTED.  The SARS-CoV-2 RNA is generally detectable in upper respiratory specimens during the acute phase of infection. The lowest concentration of SARS-CoV-2 viral copies this assay can detect is 138 copies/mL. A negative result does not preclude SARS-Cov-2 infection and should not be used as the sole basis for treatment or other patient management decisions. A negative result may occur  with  improper specimen collection/handling, submission of specimen other than nasopharyngeal swab, presence of viral mutation(s) within the areas targeted by this assay, and inadequate number of viral copies(<138 copies/mL). A negative result must be combined with clinical observations, patient history, and epidemiological information. The expected result is Negative.  Fact Sheet for Patients:  bloggercourse.com  Fact Sheet for Healthcare Providers:  seriousbroker.it  This test is no t yet approved or cleared by the [place]  FDA and  has been authorized for detection and/or diagnosis of SARS-CoV-2 by FDA under an Emergency Use Authorization (EUA). This EUA will remain  in effect (meaning this test can be used) for the duration of the COVID-19 declaration under Section 564(b)(1) of the Act, 21 U.S.C.section 360bbb-3(b)(1), unless the authorization is terminated  or revoked sooner.       Influenza A by PCR NEGATIVE NEGATIVE Final   Influenza B by PCR NEGATIVE NEGATIVE Final    Comment: (NOTE) The Xpert Xpress SARS-CoV-2/FLU/RSV plus assay is intended as an aid  in the diagnosis of influenza from Nasopharyngeal swab specimens and should not be used as a sole basis for treatment. Nasal washings and aspirates are unacceptable for Xpert Xpress SARS-CoV-2/FLU/RSV testing.  Fact Sheet for Patients: bloggercourse.com  Fact Sheet for Healthcare Providers: seriousbroker.it  This test is not yet approved or cleared by the [place]  FDA and has been authorized for detection and/or diagnosis of SARS-CoV-2 by FDA under an Emergency Use Authorization (EUA). This EUA will remain in effect (meaning this test can be used) for the duration of the COVID-19 declaration under Section 564(b)(1) of the Act, 21 U.S.C. section 360bbb-3(b)(1), unless the authorization is terminated  or revoked.     Resp Syncytial Virus by PCR NEGATIVE NEGATIVE Final    Comment: (NOTE) Fact Sheet for Patients: bloggercourse.com  Fact Sheet for Healthcare Providers: seriousbroker.it  This test is not yet approved or cleared by the [place]  FDA and has been authorized for detection and/or diagnosis of SARS-CoV-2 by FDA under an Emergency Use Authorization (EUA). This EUA will remain in effect (meaning this test can be used) for the duration of the COVID-19 declaration under Section 564(b)(1) of the Act, 21 U.S.C. section 360bbb-3(b)(1), unless the authorization is terminated or revoked.  Performed at [facility], [address] [place], [place] [address]          Radiology Studies: CT Angio Chest PE W and/or Wo Contrast Result Date: 08/16/2024 CLINICAL DATA:  Cough and chills.  Shortness of breath. EXAM: CT ANGIOGRAPHY CHEST WITH CONTRAST TECHNIQUE: Multidetector CT imaging of the chest was performed using the standard protocol during bolus administration of intravenous contrast. Multiplanar CT image reconstructions and MIPs were obtained to evaluate the vascular anatomy. RADIATION DOSE REDUCTION: This exam was performed according to the departmental dose-optimization program which includes automated exposure control, adjustment of the mA and/or kV according to patient size and/or use of iterative reconstruction technique. CONTRAST:  75mL OMNIPAQUE  IOHEXOL  350 MG/ML SOLN COMPARISON:  None Available. FINDINGS: Cardiovascular: The heart size is normal. No substantial pericardial effusion. Coronary artery calcification is evident. Mild atherosclerotic calcification is noted in the wall of the thoracic aorta. Enlargement of the pulmonary outflow tract/main pulmonary arteries suggests pulmonary arterial hypertension. There is no filling defect within the opacified pulmonary arteries to suggest the presence of an  acute pulmonary embolus. Mediastinum/Nodes: No mediastinal lymphadenopathy. There is no hilar lymphadenopathy. Small hiatal hernia with surgical changes noted in the proximal stomach. There is no axillary lymphadenopathy. Lungs/Pleura: Diffuse nodular airspace disease is seen in all 3 lobes of the right lung, most advanced in the lower right middle and lower lobes with there is associated interlobular septal thickening. Left lung is clear. No pleural effusion. Upper Abdomen: Surgical changes in the stomach compatible with gastric bypass surgery. Musculoskeletal: No worrisome lytic or sclerotic osseous abnormality. Review of the MIP images confirms the above findings. IMPRESSION: 1. No CT evidence for acute pulmonary embolus. 2. Diffuse nodular and confluent airspace disease in all 3 lobes of the right lung, most advanced in the lower right middle and lower lobes with associated interlobular septal thickening. Imaging features are most suggestive of multifocal pneumonia. 3. Enlargement of the pulmonary outflow tract/main pulmonary arteries suggests pulmonary arterial hypertension. 4. Small hiatal hernia with surgical changes in the proximal stomach compatible with gastric bypass. 5.  Aortic Atherosclerosis (ICD10-I70.0). Electronically Signed   By: Camellia Candle M.D.   On: 08/16/2024 07:40    Scheduled Meds:  flecainide   50 mg Oral BID  folic acid   1 mg Oral Daily   metoprolol  tartrate  12.5 mg Oral BID   pantoprazole   40 mg Oral Daily   rivaroxaban   20 mg Oral Q supper   vitamin E   400 Units Oral Daily   Continuous Infusions:  cefTRIAXone  (ROCEPHIN )  IV       LOS: 1 day     Almarie KANDICE Hoots, MD  08/17/2024, 8:27 AM   "

## 2024-08-17 NOTE — Progress Notes (Signed)
 "  NAME:  Tyrone [name], MRN:  [id number], DOB:  [birth year], LOS: 1 ADMISSION DATE:  08/16/2024, CONSULTATION DATE: 08/16/2024 REFERRING MD: Dr. Dennise, CHIEF COMPLAINT: Shortness of breath, cough, hemoptysis  History of Present Illness:  Patient being seen for shortness of breath, cough, hemoptysis   Woke up in the early morning hours with cough, mucus production, hemoptysis Did not have symptoms the previous day, was not coughing, was able to work in his yard Woke up in the middle of the night with coughing, fever, mucus with blood in it No frank hemoptysis Has not had any GI symptoms, no dysuria, no urgency   History of atrial fibrillation on Xarelto , ablation in the past, history of stroke in 2013 Past history of obstructive sleep apnea-use CPAP for a while but lost a lot of weight, gastric bypass surgery, nonischemic cardiomyopathy, past history of DM Barr   Evaluation in the ED-CT scan with multifocal infiltrate on the right side  Pertinent  Medical History   Past Medical History:  Diagnosis Date   Anemia, iron deficiency 01/30/2017   Cervicalgia    CVA (cerebral infarction) 05/01/12   on pradaxa  at time of stroke, switched to xarelto    External thrombosed hemorrhoids    GERD (gastroesophageal reflux disease)    Guillain Barr syndrome    following seasonal influenza vaccination   Guillain-Barre syndrome    from stroke in 2013   Hematuria    a. 01/2012 - to f/u with urology as outpt.   Hernia of unspecified site of abdominal cavity without mention of obstruction or gangrene    of abdominal   Hx of gastric bypass 01/30/2017   2004 [facility] weight 450 lbs   Hypertension    Iron malabsorption 01/30/2017   Hx gastric bypass 2004   Morbid obesity s/p gastric bypass    Nonischemic cardiomyopathy (HCC)    a. 01/2012 Echo: EF 35-40%, Diff HK, mild MR;  b. 01/2012 R&L Heart Cath: mildly elevated R heart pressures (pcwp 17) , nl cors.   Osteoarthrosis, unspecified  whether generalized or localized, ankle and foot    of the knees,ankle and foot   Pancytopenia    Persistent atrial fibrillation (HCC)    a. Pradaxa  started 01/2012;  b .s/p TEE/DCCV 02/16/2012; c. recurrent afib->Tikosyn  Initiation 03/2012   Sleep apnea    CPAP compliant   Status post bypass gastrojejunostomy 01/30/2017   Stroke (HCC)    Unspecified intestinal malabsorption    Urticaria, unspecified    Vitamin B 12 deficiency    Significant Hospital Events: Including procedures, antibiotic start and stop dates in addition to other pertinent events   CT chest 08/16/2024-multifocal infiltrate on the right  Interim History / Subjective:  Feeling overall better today Off oxygen supplementation Denies any chest pains or discomfort Still coughing with minimal blood  Objective    Blood pressure 121/83, pulse 71, temperature 98.9 F (37.2 C), resp. rate 14, height 5' 6 (1.676 m), weight 104.3 kg, SpO2 94%.        Intake/Output Summary (Last 24 hours) at 08/17/2024 1203 Last data filed at 08/16/2024 1816 Gross per 24 hour  Intake 120 ml  Output --  Net 120 ml   Filed Weights   08/16/24 0904 08/16/24 1441  Weight: 104.3 kg 104.3 kg    Examination: General: Middle-age, does not appear to be in distress HENT: Moist oral mucosa Lungs: Clear breath sounds bilaterally Cardiovascular: S1-S2 appreciated Abdomen: Soft, bowel sounds appreciated  I  reviewed last 24 h vitals and pain scores, last 48 h intake and output, last 24 h labs and trends, and last 24 h imaging results.  Strep pneumo antigen positive Resolved problem list   Assessment and Plan  Multilobar pneumonia Community-acquired pneumonia Pneumonia secondary to Streptococcus pneumonia - Continue antibiotic therapy  Acute hypoxemic respiratory failure - This seems to be getting better  Hemoptysis - Likely related to his pneumonia - Not having frank hemoptysis, continue to monitor  Mild pulmonary hypertension -  Right ventricular systolic function is normal, right ventricular size is normal, diastolic parameters normal, ejection fraction of 60 to 65%  Paroxysmal atrial fibrillation - Anticoagulation resumed today  Past history of obstructive sleep apnea - Last sleep study was in 2019, BMI at that time was 32.6 current BMI is 37.12 -In the context of atrial fibrillation mild pulmonary hypertension, a repeat sleep study is recommended when more stable  Will follow  Jennet Epley, MD [facility] Pager: See Amion  "

## 2024-08-17 NOTE — Plan of Care (Signed)

## 2024-08-18 ENCOUNTER — Other Ambulatory Visit (HOSPITAL_COMMUNITY): Payer: Self-pay

## 2024-08-18 DIAGNOSIS — J13 Pneumonia due to Streptococcus pneumoniae: Secondary | ICD-10-CM | POA: Diagnosis not present

## 2024-08-18 DIAGNOSIS — I272 Pulmonary hypertension, unspecified: Secondary | ICD-10-CM | POA: Diagnosis not present

## 2024-08-18 DIAGNOSIS — J188 Other pneumonia, unspecified organism: Secondary | ICD-10-CM | POA: Diagnosis not present

## 2024-08-18 DIAGNOSIS — J9601 Acute respiratory failure with hypoxia: Secondary | ICD-10-CM | POA: Diagnosis not present

## 2024-08-18 LAB — CBC
HCT: 32.9 % — ABNORMAL LOW (ref 39.0–52.0)
Hemoglobin: 11 g/dL — ABNORMAL LOW (ref 13.0–17.0)
MCH: 34.4 pg — ABNORMAL HIGH (ref 26.0–34.0)
MCHC: 33.4 g/dL (ref 30.0–36.0)
MCV: 102.8 fL — ABNORMAL HIGH (ref 80.0–100.0)
Platelets: 177 10*3/uL (ref 150–400)
RBC: 3.2 MIL/uL — ABNORMAL LOW (ref 4.22–5.81)
RDW: 13.3 % (ref 11.5–15.5)
WBC: 7.8 10*3/uL (ref 4.0–10.5)
nRBC: 0 % (ref 0.0–0.2)

## 2024-08-18 MED ORDER — AMOXICILLIN-POT CLAVULANATE 875-125 MG PO TABS
1.0000 | ORAL_TABLET | Freq: Two times a day (BID) | ORAL | 0 refills | Status: AC
  Filled 2024-08-18: qty 10, 5d supply, fill #0

## 2024-08-18 NOTE — Progress Notes (Signed)
" °   08/18/24 1221  TOC Brief Assessment  Insurance and Status Reviewed  Patient has primary care physician Yes  Home environment has been reviewed resides in a private residence  Prior level of function: Independent  Prior/Current Home Services No current home services  Social Drivers of Health Review SDOH reviewed no interventions necessary  Readmission risk has been reviewed Yes  Transition of care needs no transition of care needs at this time    "

## 2024-08-18 NOTE — Progress Notes (Signed)
 Discharge medications delivered to patient at the bedside in a secure bag.

## 2024-08-18 NOTE — Discharge Summary (Signed)
 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   He was placed on oxygen at 2.5 L currently. CT chest No CT evidence for acute pulmonary embolus.Diffuse nodular and confluent airspace disease in all 3 lobes of the right lung, most advanced in the lower right middle and lower lobes with associated interlobular septal thickening. Imaging features are most suggestive of multifocal pneumonia. Enlargement of the pulmonary outflow tract/main pulmonary arteries suggests pulmonary arterial hypertension. Small hiatal hernia with surgical changes in the proximal stomach compatible with gastric bypass.Aortic Atherosclerosis    Discharge Diagnoses:  Principal Problem:   Acute respiratory failure with hypoxia (HCC)     1 acute hypoxic respiratory failure due to community-acquired pneumonia patient admitted with mild hemoptysis subjective fever chills and cough and shortness of breath.  CT with evidence of right-sided consolidation.  He was started on Rocephin  and azithromycin .  Xarelto  was on hold initially due to hemoptysis.  Overnight patient did not have any further episodes of hemoptysis.  And urine Legionella positive.  Continue Rocephin  DC azithromycin      #2  History of persistent atrial fibrillation and history of stroke with dilated left atrium.  Echo from a year ago noted.  Repeat echo has been ordered this admission.  Continue Xarelto .  His rate is controlled on Tikosyn  and beta-blocker.    #3 history of diastolic heart failure with a EF of 55% and pulmonary artery hypertension.  Patient still with soft blood pressure on IV fluids.  Will hold restarting his home diuretics today.  #4Guillain-Barr syndrome and stroke continue Xarelto    #5 history of gastric bypass surgery stable   6.  History of sleep apnea stopped using CPAP will need outpatient sleep study. #7 history of essential hypertension on Norvasc  at home currently on hold due to normal to soft blood pressure already on beta-blocker.  Monitor closely.   #9 history of stroke in 2013 while he was on Pradaxa  Continue Xarelto   Estimated body mass index is 37.12 kg/m as calculated from the following:   Height as of this encounter: 5' 6 (1.676 m).   Weight as of this encounter: 104.3 kg.  Discharge Instructions   Allergies as of 08/18/2024   No Known Allergies      Medication List     TAKE these medications    amLODipine  5 MG tablet Commonly known as: NORVASC  Take 1 tablet by mouth once daily   amoxicillin -clavulanate 875-125 MG tablet Commonly known as: AUGMENTIN  Take 1 tablet by mouth 2 (two) times daily.   ferrous sulfate 325 (65 FE) MG tablet Take 325 mg by mouth daily.   flecainide  50 MG tablet Commonly known as: TAMBOCOR  Take 1 tablet by mouth twice daily   folic acid  1 MG tablet Commonly known as: FOLVITE  Take 1 tablet by mouth daily.   furosemide  40 MG tablet Commonly known as: LASIX  Take 1/2 (one-half) tablet by  mouth once daily   metoprolol  tartrate 25 MG tablet Commonly known as: LOPRESSOR  Take 1/2 (one-half) tablet by mouth twice daily   multivitamin tablet Take 1-2 tablets by mouth See admin instructions. Take 2 tablets in the morning  and 1 tablet in the evening   omeprazole 40 MG capsule Commonly known as: PRILOSEC Take 40 mg by mouth daily.   sildenafil 50 MG tablet Commonly known as: VIAGRA Take 50-100 mg by mouth daily as needed.   spironolactone  25 MG tablet Commonly known as: ALDACTONE  Take 1 tablet by mouth once daily   vitamin E  180 MG (400 UNITS) capsule Take 400 Units by mouth daily.   Xarelto  20 MG Tabs tablet Generic drug: rivaroxaban  TAKE 1 TABLET BY MOUTH ONCE DAILY WITH SUPPER        Allergies[1]  Consultations: PCCM   Procedures/Studies: ECHOCARDIOGRAM COMPLETE Result Date: 08/17/2024    ECHOCARDIOGRAM REPORT   Patient Name:   Tyrone [name] Date of Exam: 08/17/2024 Medical Rec #:  [id number]       Height:       66.0 in Accession #:    [id number]      Weight:       230.0 lb Date of Birth:  [birth year]       BSA:          2.122 m Patient Age:    58 years        BP:           121/83 mmHg Patient Gender: M               HR:           71 bpm. Exam Location:  Inpatient Procedure: 2D Echo, Cardiac Doppler and Color Doppler (Both Spectral and Color            Flow Doppler were utilized during procedure). Indications:    Pulmonary hypertension I27.2  History:        Patient has prior history of Echocardiogram examinations, most                 recent 08/14/2023. Stroke; Risk Factors:Hypertension.  Sonographer:    Sydnee Wilson RDCS Referring Phys: [id number] ADEWALE A OLALERE IMPRESSIONS  1. Left ventricular ejection fraction, by estimation, is 60 to 65%. Left ventricular ejection fraction by 2D MOD biplane is 61.5 %. The left ventricle has normal function. The left ventricle has no regional wall motion abnormalities. Left ventricular diastolic parameters were normal.  2. Right ventricular systolic function is normal. The right ventricular size is normal.  3. The mitral valve is normal in structure. No evidence of mitral valve regurgitation. No evidence of mitral stenosis.  4. High velocity across AV valve in  setting of high flow state. The aortic valve is normal in structure. Aortic valve regurgitation is not visualized. No aortic stenosis is present.  5. The inferior vena cava is normal in size with greater than 50% respiratory variability, suggesting right atrial pressure of 3 mmHg. FINDINGS  Left Ventricle: Left ventricular ejection fraction, by estimation, is 60 to 65%. Left ventricular ejection fraction by 2D MOD biplane is 61.5 %. The left ventricle has normal function. The left ventricle has no regional wall motion abnormalities. The left ventricular internal cavity size was normal in size. There is no left ventricular hypertrophy. Left ventricular diastolic parameters were normal. Right Ventricle: The right ventricular size is normal. No increase in right ventricular wall thickness. Right ventricular systolic function is normal. Left  Atrium: Left atrial size was normal in size. Right Atrium: Right atrial size was normal in size. Pericardium: There is no evidence of pericardial effusion. Mitral Valve: The mitral valve is normal in structure. No evidence of mitral valve regurgitation. No evidence of mitral valve stenosis. Tricuspid Valve: The tricuspid valve is normal in structure. Tricuspid valve regurgitation is not demonstrated. No evidence of tricuspid stenosis. Aortic Valve: High velocity across AV valve in setting of high flow state. The aortic valve is normal in structure. Aortic valve regurgitation is not visualized. No aortic stenosis is present. Aortic valve mean gradient measures 10.8 mmHg. Aortic valve peak gradient measures 19.5 mmHg. Aortic valve area, by VTI measures 2.11 cm. Pulmonic Valve: The pulmonic valve was normal in structure. Pulmonic valve regurgitation is trivial. No evidence of pulmonic stenosis. Aorta: The aortic root is normal in size and structure. Venous: The inferior vena cava is normal in size with greater than 50% respiratory variability, suggesting right atrial pressure of 3  mmHg. IAS/Shunts: No atrial level shunt detected by color flow Doppler.  LEFT VENTRICLE PLAX 2D                        Biplane EF (MOD) LVIDd:         4.70 cm         LV Biplane EF:   Left LVIDs:         3.10 cm                          ventricular LV PW:         1.10 cm                          ejection LV IVS:        1.00 cm                          fraction by LVOT diam:     2.00 cm                          2D MOD LV SV:         93                               biplane is LV SV Index:   44                               61.5 %. LVOT Area:     3.14 cm                                Diastology                                LV e' medial:    8.81 cm/s LV Volumes (MOD)               LV E/e' medial:  11.7 LV vol d, MOD    177.0 ml      LV e' lateral:   15.90 cm/s A2C:  LV E/e' lateral: 6.5 LV vol d, MOD    124.0 ml A4C: LV vol s, MOD    63.1 ml A2C: LV vol s, MOD    51.5 ml A4C: LV SV MOD A2C:   113.9 ml LV SV MOD A4C:   124.0 ml LV SV MOD BP:    91.4 ml RIGHT VENTRICLE RV S prime:     15.70 cm/s TAPSE (M-mode): 2.5 cm LEFT ATRIUM              Index        RIGHT ATRIUM           Index LA diam:        3.90 cm  1.84 cm/m   RA Area:     16.50 cm LA Vol (A2C):   101.0 ml 47.59 ml/m  RA Volume:   42.90 ml  20.21 ml/m LA Vol (A4C):   48.8 ml  23.00 ml/m LA Biplane Vol: 71.3 ml  33.60 ml/m  AORTIC VALVE AV Area (Vmax):    1.94 cm AV Area (Vmean):   1.90 cm AV Area (VTI):     2.11 cm AV Vmax:           221.00 cm/s AV Vmean:          152.750 cm/s AV VTI:            0.440 m AV Peak Grad:      19.5 mmHg AV Mean Grad:      10.8 mmHg LVOT Vmax:         136.60 cm/s LVOT Vmean:        92.560 cm/s LVOT VTI:          0.295 m LVOT/AV VTI ratio: 0.67  AORTA Ao Root diam: 3.00 cm Ao Asc diam:  3.20 cm MITRAL VALVE                TRICUSPID VALVE MV Area (PHT): 2.47 cm     TR Peak grad:   20.1 mmHg MV E velocity: 103.00 cm/s  TR Vmax:        224.00 cm/s MV A velocity: 61.70 cm/s MV E/A ratio:  1.67          SHUNTS                             Systemic VTI:  0.30 m                             Systemic Diam: 2.00 cm Joelle Cedars Tonleu Electronically signed by Joelle Cedars Tonleu Signature Date/Time: 08/17/2024/11:34:04 AM    Final    CT Angio Chest PE W and/or Wo Contrast Result Date: 08/16/2024 CLINICAL DATA:  Cough and chills.  Shortness of breath. EXAM: CT ANGIOGRAPHY CHEST WITH CONTRAST TECHNIQUE: Multidetector CT imaging of the chest was performed using the standard protocol during bolus administration of intravenous contrast. Multiplanar CT image reconstructions and MIPs were obtained to evaluate the vascular anatomy. RADIATION DOSE REDUCTION: This exam was performed according to the departmental dose-optimization program which includes automated exposure control, adjustment of the mA and/or kV according to patient size and/or use of iterative reconstruction technique. CONTRAST:  75mL OMNIPAQUE  IOHEXOL  350 MG/ML SOLN COMPARISON:  None Available. FINDINGS: Cardiovascular: The heart size is normal. No substantial pericardial effusion. Coronary artery calcification is evident. Mild atherosclerotic calcification is noted in the wall of  the thoracic aorta. Enlargement of the pulmonary outflow tract/main pulmonary arteries suggests pulmonary arterial hypertension. There is no filling defect within the opacified pulmonary arteries to suggest the presence of an acute pulmonary embolus. Mediastinum/Nodes: No mediastinal lymphadenopathy. There is no hilar lymphadenopathy. Small hiatal hernia with surgical changes noted in the proximal stomach. There is no axillary lymphadenopathy. Lungs/Pleura: Diffuse nodular airspace disease is seen in all 3 lobes of the right lung, most advanced in the lower right middle and lower lobes with there is associated interlobular septal thickening. Left lung is clear. No pleural effusion. Upper Abdomen: Surgical changes in the stomach compatible with gastric bypass surgery.  Musculoskeletal: No worrisome lytic or sclerotic osseous abnormality. Review of the MIP images confirms the above findings. IMPRESSION: 1. No CT evidence for acute pulmonary embolus. 2. Diffuse nodular and confluent airspace disease in all 3 lobes of the right lung, most advanced in the lower right middle and lower lobes with associated interlobular septal thickening. Imaging features are most suggestive of multifocal pneumonia. 3. Enlargement of the pulmonary outflow tract/main pulmonary arteries suggests pulmonary arterial hypertension. 4. Small hiatal hernia with surgical changes in the proximal stomach compatible with gastric bypass. 5.  Aortic Atherosclerosis (ICD10-I70.0). Electronically Signed   By: Camellia Candle M.D.   On: 08/16/2024 07:40   (Echo, Carotid, EGD, Colonoscopy, ERCP)    Subjective: Awake alert wife at bedside anxious to go home had a good night  Discharge Exam: Vitals:   08/17/24 2100 08/18/24 1144  BP: 134/79 125/87  Pulse: 76 (!) 59  Resp:  18  Temp: 98.7 F (37.1 C) 98.5 F (36.9 C)  SpO2: 97% 98%   Vitals:   08/17/24 0922 08/17/24 1351 08/17/24 2100 08/18/24 1144  BP: 121/83 115/86 134/79 125/87  Pulse: 71 64 76 (!) 59  Resp: 14 14  18   Temp: 98.9 F (37.2 C) 98.3 F (36.8 C) 98.7 F (37.1 C) 98.5 F (36.9 C)  TempSrc:      SpO2: 94% 99% 97% 98%  Weight:      Height:        General: Pt is alert, awake, not in acute distress Cardiovascular: RRR, S1/S2 +, no rubs, no gallops Respiratory: CTA bilaterally, no wheezing, no rhonchi Abdominal: Soft, NT, ND, bowel sounds + Extremities: no edema, no cyanosis    The results of significant diagnostics from this hospitalization (including imaging, microbiology, ancillary and laboratory) are listed below for reference.     Microbiology: Recent Results (from the past 240 hours)  Resp panel by RT-PCR (RSV, Flu A&B, Covid) Anterior Nasal Swab     Status: Abnormal   Collection Time: 08/16/24  6:18 AM    Specimen: Anterior Nasal Swab  Result Value Ref Range Status   SARS Coronavirus 2 by RT PCR (A) NEGATIVE Final    INVALID, UNABLE TO DETERMINE THE PRESENCE OF TARGET DUE TO SPECIMEN INTEGRITY. RECOLLECTION REQUESTED.    Comment: DANIEL G 08/16/24 AT 0719 HS (NOTE) Presence or absence of SARS-COV-2, Flu A, Flu B and/or RSV viral target RNA cannot be determined. Repeat testing is recommended. The expected result is Negative.  Fact Sheet for Patients:  bloggercourse.com  Fact Sheet for Healthcare Providers: seriousbroker.it  This test is not yet approved or cleared by the [place]  FDA and  has been authorized for detection and/or diagnosis of SARS-CoV-2 by FDA under an Emergency Use Authorization (EUA). This EUA will remain  in effect (meaning this test can be used) for the duration of the COVID-19  declaration under Section 564(b)(1) of the Act, 21 U.S.C. section 360bbb-3(b)(1), unless the authorization is terminated or revoked.     Influenza A by PCR (A) NEGATIVE Final    INVALID, UNABLE TO DETERMINE THE PRESENCE OF TARGET DUE TO SPECIMEN INTEGRITY. RECOLLECTION REQUESTED.   Influenza B by PCR (A) NEGATIVE Final    INVALID, UNABLE TO DETERMINE THE PRESENCE OF TARGET DUE TO SPECIMEN INTEGRITY. RECOLLECTION REQUESTED.    Comment: (NOTE) The Xpert Xpress SARS-CoV-2/FLU/RSV plus assay is intended as an aid in the diagnosis of influenza from Nasopharyngeal swab specimens and should not be used as a sole basis for treatment. Nasal washings and aspirates are unacceptable for Xpert Xpress SARS-CoV-2/FLU/RSV testing.  Fact Sheet for Patients: bloggercourse.com  Fact Sheet for Healthcare Providers: seriousbroker.it  This test is not yet approved or cleared by the [place]  FDA and has been authorized for detection and/or diagnosis of SARS-CoV-2 by FDA under an Emergency Use  Authorization (EUA). This EUA will remain in effect (meaning this test can be used) for the duration of the COVID-19 declaration under Section 564(b)(1) of the Act, 21 U.S.C. section 360bbb-3(b)(1), unless the authorization is terminated or revoked.     Resp Syncytial Virus by PCR (A) NEGATIVE Final    INVALID, UNABLE TO DETERMINE THE PRESENCE OF TARGET DUE TO SPECIMEN INTEGRITY. RECOLLECTION REQUESTED.    Comment: (NOTE) Fact Sheet for Patients: bloggercourse.com  Fact Sheet for Healthcare Providers: seriousbroker.it  This test is not yet approved or cleared by the [place]  FDA and has been authorized for detection and/or diagnosis of SARS-CoV-2 by FDA under an Emergency Use Authorization (EUA). This EUA will remain in effect (meaning this test can be used) for the duration of the COVID-19 declaration under Section 564(b)(1) of the Act, 21 U.S.C. section 360bbb-3(b)(1), unless the authorization is terminated or revoked.  Performed at [facility], [address], [place], [place] [address]   Blood culture (routine x 2)     Status: None (Preliminary result)   Collection Time: 08/16/24  6:18 AM   Specimen: BLOOD  Result Value Ref Range Status   Specimen Description   Final    BLOOD RIGHT ANTECUBITAL Performed at [facility], [address], [place], [place] [address]    Special Requests   Final    BOTTLES DRAWN AEROBIC AND ANAEROBIC Blood Culture adequate volume Performed at [facility], [address] [place], [place] [address]    Culture   Final    NO GROWTH 1 DAY Performed at [facility], [address] [place], [place] [address]    Report Status PENDING  Incomplete  Blood culture (routine x 2)     Status: None (Preliminary result)   Collection Time: 08/16/24  6:24 AM   Specimen: BLOOD  Result Value Ref Range Status   Specimen Description    Final    BLOOD LEFT ANTECUBITAL Performed at [facility], [address] [place], [place] [address]    Special Requests   Final    BOTTLES DRAWN AEROBIC AND ANAEROBIC Blood Culture adequate volume Performed at [facility], [address], [place], [place] [address]    Culture   Final    NO GROWTH 1 DAY Performed at [facility], [address] [place], [place] [address]    Report Status PENDING  Incomplete  Resp panel by RT-PCR (RSV, Flu A&B, Covid) Anterior Nasal Swab     Status: None   Collection Time:  08/16/24  7:24 AM   Specimen: Anterior Nasal Swab  Result Value Ref Range Status   SARS Coronavirus 2 by RT PCR NEGATIVE NEGATIVE Final    Comment: (NOTE) SARS-CoV-2 target nucleic acids are NOT DETECTED.  The SARS-CoV-2 RNA is generally detectable in upper respiratory specimens during the acute phase of infection. The lowest concentration of SARS-CoV-2 viral copies this assay can detect is 138 copies/mL. A negative result does not preclude SARS-Cov-2 infection and should not be used as the sole basis for treatment or other patient management decisions. A negative result may occur with  improper specimen collection/handling, submission of specimen other than nasopharyngeal swab, presence of viral mutation(s) within the areas targeted by this assay, and inadequate number of viral copies(<138 copies/mL). A negative result must be combined with clinical observations, patient history, and epidemiological information. The expected result is Negative.  Fact Sheet for Patients:  bloggercourse.com  Fact Sheet for Healthcare Providers:  seriousbroker.it  This test is no t yet approved or cleared by the [place]  FDA and  has been authorized for detection and/or diagnosis of SARS-CoV-2 by FDA under an Emergency Use Authorization (EUA). This EUA will remain  in effect (meaning this  test can be used) for the duration of the COVID-19 declaration under Section 564(b)(1) of the Act, 21 U.S.C.section 360bbb-3(b)(1), unless the authorization is terminated  or revoked sooner.       Influenza A by PCR NEGATIVE NEGATIVE Final   Influenza B by PCR NEGATIVE NEGATIVE Final    Comment: (NOTE) The Xpert Xpress SARS-CoV-2/FLU/RSV plus assay is intended as an aid in the diagnosis of influenza from Nasopharyngeal swab specimens and should not be used as a sole basis for treatment. Nasal washings and aspirates are unacceptable for Xpert Xpress SARS-CoV-2/FLU/RSV testing.  Fact Sheet for Patients: bloggercourse.com  Fact Sheet for Healthcare Providers: seriousbroker.it  This test is not yet approved or cleared by the [place]  FDA and has been authorized for detection and/or diagnosis of SARS-CoV-2 by FDA under an Emergency Use Authorization (EUA). This EUA will remain in effect (meaning this test can be used) for the duration of the COVID-19 declaration under Section 564(b)(1) of the Act, 21 U.S.C. section 360bbb-3(b)(1), unless the authorization is terminated or revoked.     Resp Syncytial Virus by PCR NEGATIVE NEGATIVE Final    Comment: (NOTE) Fact Sheet for Patients: bloggercourse.com  Fact Sheet for Healthcare Providers: seriousbroker.it  This test is not yet approved or cleared by the [place]  FDA and has been authorized for detection and/or diagnosis of SARS-CoV-2 by FDA under an Emergency Use Authorization (EUA). This EUA will remain in effect (meaning this test can be used) for the duration of the COVID-19 declaration under Section 564(b)(1) of the Act, 21 U.S.C. section 360bbb-3(b)(1), unless the authorization is terminated or revoked.  Performed at [facility], [address], [place], [place] [address]      Labs: BNP (last  3 results) No results for input(s): BNP in the last 8760 hours. Basic Metabolic Panel: Recent Labs  Lab 08/16/24 0618 08/17/24 0553  NA 136 136  K 4.0 4.0  CL 100 102  CO2 22 26  GLUCOSE 101* 109*  BUN 15 12  CREATININE 0.86 0.89  CALCIUM  8.7* 8.9   Liver Function Tests: Recent Labs  Lab 08/16/24 0618 08/17/24 0553  AST 17 18  ALT 20 16  ALKPHOS 71 63  BILITOT 0.6 0.9  PROT 6.3* 5.8*  ALBUMIN 4.1 3.5   No  results for input(s): LIPASE, AMYLASE in the last 168 hours. No results for input(s): AMMONIA in the last 168 hours. CBC: Recent Labs  Lab 08/16/24 0618 08/17/24 0553 08/18/24 0811  WBC 8.3 12.7* 7.8  HGB 11.7* 11.2* 11.0*  HCT 34.5* 33.5* 32.9*  MCV 99.4 101.2* 102.8*  PLT 179 106* 177   Cardiac Enzymes: No results for input(s): CKTOTAL, CKMB, CKMBINDEX, TROPONINI in the last 168 hours. BNP: Invalid input(s): POCBNP CBG: No results for input(s): GLUCAP in the last 168 hours. D-Dimer No results for input(s): DDIMER in the last 72 hours. Hgb A1c No results for input(s): HGBA1C in the last 72 hours. Lipid Profile No results for input(s): CHOL, HDL, LDLCALC, TRIG, CHOLHDL, LDLDIRECT in the last 72 hours. Thyroid  function studies No results for input(s): TSH, T4TOTAL, T3FREE, THYROIDAB in the last 72 hours.  Invalid input(s): FREET3 Anemia work up No results for input(s): VITAMINB12, FOLATE, FERRITIN, TIBC, IRON, RETICCTPCT in the last 72 hours. Urinalysis    Component Value Date/Time   COLORURINE YELLOW 08/16/2024 0841   APPEARANCEUR CLEAR 08/16/2024 0841   LABSPEC >1.046 (H) 08/16/2024 0841   PHURINE 6.0 08/16/2024 0841   GLUCOSEU NEGATIVE 08/16/2024 0841   HGBUR NEGATIVE 08/16/2024 0841   BILIRUBINUR NEGATIVE 08/16/2024 0841   KETONESUR 15 (A) 08/16/2024 0841   PROTEINUR NEGATIVE 08/16/2024 0841   UROBILINOGEN 0.2 02/11/2012 1255   NITRITE NEGATIVE 08/16/2024 0841   LEUKOCYTESUR  NEGATIVE 08/16/2024 0841   Sepsis Labs Recent Labs  Lab 08/16/24 0618 08/17/24 0553 08/18/24 0811  WBC 8.3 12.7* 7.8   Microbiology Recent Results (from the past 240 hours)  Resp panel by RT-PCR (RSV, Flu A&B, Covid) Anterior Nasal Swab     Status: Abnormal   Collection Time: 08/16/24  6:18 AM   Specimen: Anterior Nasal Swab  Result Value Ref Range Status   SARS Coronavirus 2 by RT PCR (A) NEGATIVE Final    INVALID, UNABLE TO DETERMINE THE PRESENCE OF TARGET DUE TO SPECIMEN INTEGRITY. RECOLLECTION REQUESTED.    Comment: DANIEL G 08/16/24 AT 0719 HS (NOTE) Presence or absence of SARS-COV-2, Flu A, Flu B and/or RSV viral target RNA cannot be determined. Repeat testing is recommended. The expected result is Negative.  Fact Sheet for Patients:  bloggercourse.com  Fact Sheet for Healthcare Providers: seriousbroker.it  This test is not yet approved or cleared by the [place]  FDA and  has been authorized for detection and/or diagnosis of SARS-CoV-2 by FDA under an Emergency Use Authorization (EUA). This EUA will remain  in effect (meaning this test can be used) for the duration of the COVID-19 declaration under Section 564(b)(1) of the Act, 21 U.S.C. section 360bbb-3(b)(1), unless the authorization is terminated or revoked.     Influenza A by PCR (A) NEGATIVE Final    INVALID, UNABLE TO DETERMINE THE PRESENCE OF TARGET DUE TO SPECIMEN INTEGRITY. RECOLLECTION REQUESTED.   Influenza B by PCR (A) NEGATIVE Final    INVALID, UNABLE TO DETERMINE THE PRESENCE OF TARGET DUE TO SPECIMEN INTEGRITY. RECOLLECTION REQUESTED.    Comment: (NOTE) The Xpert Xpress SARS-CoV-2/FLU/RSV plus assay is intended as an aid in the diagnosis of influenza from Nasopharyngeal swab specimens and should not be used as a sole basis for treatment. Nasal washings and aspirates are unacceptable for Xpert Xpress SARS-CoV-2/FLU/RSV testing.  Fact Sheet for  Patients: bloggercourse.com  Fact Sheet for Healthcare Providers: seriousbroker.it  This test is not yet approved or cleared by the [place]  FDA and has been authorized for detection and/or  diagnosis of SARS-CoV-2 by FDA under an Emergency Use Authorization (EUA). This EUA will remain in effect (meaning this test can be used) for the duration of the COVID-19 declaration under Section 564(b)(1) of the Act, 21 U.S.C. section 360bbb-3(b)(1), unless the authorization is terminated or revoked.     Resp Syncytial Virus by PCR (A) NEGATIVE Final    INVALID, UNABLE TO DETERMINE THE PRESENCE OF TARGET DUE TO SPECIMEN INTEGRITY. RECOLLECTION REQUESTED.    Comment: (NOTE) Fact Sheet for Patients: bloggercourse.com  Fact Sheet for Healthcare Providers: seriousbroker.it  This test is not yet approved or cleared by the [place]  FDA and has been authorized for detection and/or diagnosis of SARS-CoV-2 by FDA under an Emergency Use Authorization (EUA). This EUA will remain in effect (meaning this test can be used) for the duration of the COVID-19 declaration under Section 564(b)(1) of the Act, 21 U.S.C. section 360bbb-3(b)(1), unless the authorization is terminated or revoked.  Performed at [facility], [address], [place], [place] [address]   Blood culture (routine x 2)     Status: None (Preliminary result)   Collection Time: 08/16/24  6:18 AM   Specimen: BLOOD  Result Value Ref Range Status   Specimen Description   Final    BLOOD RIGHT ANTECUBITAL Performed at [facility], [address], [place], [place] [address]    Special Requests   Final    BOTTLES DRAWN AEROBIC AND ANAEROBIC Blood Culture adequate volume Performed at [facility], [address] [place], [place] [address]    Culture   Final    NO GROWTH 1  DAY Performed at [facility], [address] [place], [place] [address]    Report Status PENDING  Incomplete  Blood culture (routine x 2)     Status: None (Preliminary result)   Collection Time: 08/16/24  6:24 AM   Specimen: BLOOD  Result Value Ref Range Status   Specimen Description   Final    BLOOD LEFT ANTECUBITAL Performed at [facility], [address], [place], [place] [address]    Special Requests   Final    BOTTLES DRAWN AEROBIC AND ANAEROBIC Blood Culture adequate volume Performed at [facility], [address] [place], [place] [address]    Culture   Final    NO GROWTH 1 DAY Performed at [facility], [address], [place], [place] [address]    Report Status PENDING  Incomplete  Resp panel by RT-PCR (RSV, Flu A&B, Covid) Anterior Nasal Swab     Status: None   Collection Time: 08/16/24  7:24 AM   Specimen: Anterior Nasal Swab  Result Value Ref Range Status   SARS Coronavirus 2 by RT PCR NEGATIVE NEGATIVE Final    Comment: (NOTE) SARS-CoV-2 target nucleic acids are NOT DETECTED.  The SARS-CoV-2 RNA is generally detectable in upper respiratory specimens during the acute phase of infection. The lowest concentration of SARS-CoV-2 viral copies this assay can detect is 138 copies/mL. A negative result does not preclude SARS-Cov-2 infection and should not be used as the sole basis for treatment or other patient management decisions. A negative result may occur with  improper specimen collection/handling, submission of specimen other than nasopharyngeal swab, presence of viral mutation(s) within the areas targeted by this assay, and inadequate number of viral copies(<138 copies/mL). A negative result must be combined with clinical observations, patient history, and epidemiological information. The expected result is Negative.  Fact Sheet for Patients:  bloggercourse.com  Fact Sheet for  Healthcare Providers:  seriousbroker.it  This test is no t yet approved or cleared by the [place]  FDA and  has been authorized for detection and/or diagnosis of SARS-CoV-2 by FDA under an Emergency Use Authorization (EUA). This EUA will remain  in effect (meaning this test can be used) for the duration of the COVID-19 declaration under Section 564(b)(1) of the Act, 21 U.S.C.section 360bbb-3(b)(1), unless the authorization is terminated  or revoked sooner.       Influenza A by PCR NEGATIVE NEGATIVE Final   Influenza B by PCR NEGATIVE NEGATIVE Final    Comment: (NOTE) The Xpert Xpress SARS-CoV-2/FLU/RSV plus assay is intended as an aid in the diagnosis of influenza from Nasopharyngeal swab specimens and should not be used as a sole basis for treatment. Nasal washings and aspirates are unacceptable for Xpert Xpress SARS-CoV-2/FLU/RSV testing.  Fact Sheet for Patients: bloggercourse.com  Fact Sheet for Healthcare Providers: seriousbroker.it  This test is not yet approved or cleared by the [place]  FDA and has been authorized for detection and/or diagnosis of SARS-CoV-2 by FDA under an Emergency Use Authorization (EUA). This EUA will remain in effect (meaning this test can be used) for the duration of the COVID-19 declaration under Section 564(b)(1) of the Act, 21 U.S.C. section 360bbb-3(b)(1), unless the authorization is terminated or revoked.     Resp Syncytial Virus by PCR NEGATIVE NEGATIVE Final    Comment: (NOTE) Fact Sheet for Patients: bloggercourse.com  Fact Sheet for Healthcare Providers: seriousbroker.it  This test is not yet approved or cleared by the [place]  FDA and has been authorized for detection and/or diagnosis of SARS-CoV-2 by FDA under an Emergency Use Authorization (EUA). This EUA will remain in effect (meaning this  test can be used) for the duration of the COVID-19 declaration under Section 564(b)(1) of the Act, 21 U.S.C. section 360bbb-3(b)(1), unless the authorization is terminated or revoked.  Performed at [facility], [address], [place], [place] [address]      Time coordinating discharge:38 min SIGNED:   Almarie KANDICE Hoots, MD  [facility] 08/18/2024, 11:46 AM     [1] No Known Allergies

## 2024-08-18 NOTE — Progress Notes (Signed)
 "  NAME:  Tyrone [name], MRN:  [id number], DOB:  [birth year], LOS: 2 ADMISSION DATE:  08/16/2024, CONSULTATION DATE: 08/16/2024 REFERRING MD: Dr. Dennise, CHIEF COMPLAINT: Shortness of breath, cough, hemoptysis  History of Present Illness:  Patient being seen for shortness of breath, cough, hemoptysis   Woke up in the early morning hours with cough, mucus production, hemoptysis Did not have symptoms the previous day, was not coughing, was able to work in his yard Woke up in the middle of the night with coughing, fever, mucus with blood in it No frank hemoptysis Has not had any GI symptoms, no dysuria, no urgency   History of atrial fibrillation on Xarelto , ablation in the past, history of stroke in 2013 Past history of obstructive sleep apnea-use CPAP for a while but lost a lot of weight, gastric bypass surgery, nonischemic cardiomyopathy, past history of DM Barr   Evaluation in the ED-CT scan with multifocal infiltrate on the right side  Pertinent  Medical History   Past Medical History:  Diagnosis Date   Anemia, iron deficiency 01/30/2017   Cervicalgia    CVA (cerebral infarction) 05/01/12   on pradaxa  at time of stroke, switched to xarelto    External thrombosed hemorrhoids    GERD (gastroesophageal reflux disease)    Guillain Barr syndrome    following seasonal influenza vaccination   Guillain-Barre syndrome    from stroke in 2013   Hematuria    a. 01/2012 - to f/u with urology as outpt.   Hernia of unspecified site of abdominal cavity without mention of obstruction or gangrene    of abdominal   Hx of gastric bypass 01/30/2017   2004 Dr [facility] weight 450 lbs   Hypertension    Iron malabsorption 01/30/2017   Hx gastric bypass 2004   Morbid obesity s/p gastric bypass    Nonischemic cardiomyopathy (HCC)    a. 01/2012 Echo: EF 35-40%, Diff HK, mild MR;  b. 01/2012 R&L Heart Cath: mildly elevated R heart pressures (pcwp 17) , nl cors.   Osteoarthrosis, unspecified  whether generalized or localized, ankle and foot    of the knees,ankle and foot   Pancytopenia    Persistent atrial fibrillation (HCC)    a. Pradaxa  started 01/2012;  b .s/p TEE/DCCV 02/16/2012; c. recurrent afib->Tikosyn  Initiation 03/2012   Sleep apnea    CPAP compliant   Status post bypass gastrojejunostomy 01/30/2017   Stroke (HCC)    Unspecified intestinal malabsorption    Urticaria, unspecified    Vitamin B 12 deficiency    Significant Hospital Events: Including procedures, antibiotic start and stop dates in addition to other pertinent events   CT chest 08/16/2024-multifocal infiltrate on the right  Interim History / Subjective:  Feeling overall better  Objective    Blood pressure 134/79, pulse 76, temperature 98.7 F (37.1 C), resp. rate 14, height 5' 6 (1.676 m), weight 104.3 kg, SpO2 97%.        Intake/Output Summary (Last 24 hours) at 08/18/2024 1011 Last data filed at 08/18/2024 9077 Gross per 24 hour  Intake 656 ml  Output --  Net 656 ml   Filed Weights   08/16/24 0904 08/16/24 1441  Weight: 104.3 kg 104.3 kg    Examination: General: Middle-age, does not fit to be in distress HENT: Moist oral mucosa Lungs: Clear breath sounds bilaterally Cardiovascular: S1-S2 appreciated Abdomen: Soft, bowel sounds appreciated  Lab data reviewed Strep pneumo antigen positive  Resolved problem list   Assessment and Plan  Multilobar pneumonia Community-acquired pneumonia Streptococcus pneumonia positive - He is feeling better - Being discharged on antibiotics today  Hypoxemic respiratory failure - Was able to wean off oxygen  Hemoptysis - This is a lot better  Mild pulmonary hypertension - Right ventricular systolic function is normal, right ventricular size is normal, diastolic parameters normal with ejection fraction of 60 to 65% on repeat echo  Paroxysmal atrial fibrillation - On chronic anticoagulation  Past history of obstructive sleep apnea - Last sleep  study was in 2019, BMI at that time was 32.6 current BMI is 37.12 -In the context of atrial fibrillation mild pulmonary hypertension, a repeat sleep study is recommended when more stable  Discussed with Dr. Alvia, agree with discharge "

## 2024-08-18 NOTE — Progress Notes (Signed)
 AVS reviewed with patient and spouse at bedside. All questions answered, and patient verbalized understanding. IV removed per ordered without complications. Patient to be discharge home via vehicle. Patient escorted to main entrance via WC by staff.

## 2024-08-19 LAB — MISC LABCORP TEST (SEND OUT)
LabCorp test name: 83935
Labcorp test code: 83935

## 2024-08-20 LAB — LEGIONELLA PNEUMOPHILA SEROGP 1 UR AG: L. pneumophila Serogp 1 Ur Ag: NEGATIVE

## 2024-08-21 LAB — CULTURE, BLOOD (ROUTINE X 2)
Culture: NO GROWTH
Culture: NO GROWTH
Special Requests: ADEQUATE
Special Requests: ADEQUATE

## 2024-09-03 ENCOUNTER — Ambulatory Visit: Admitting: Family Medicine

## 2024-09-05 ENCOUNTER — Ambulatory Visit: Admitting: Cardiovascular Disease

## 2024-09-08 ENCOUNTER — Ambulatory Visit: Admitting: Family Medicine
